# Patient Record
Sex: Female | Born: 1945 | Race: White | Hispanic: No | Smoking: Former smoker
Health system: Southern US, Community
[De-identification: ages and names within clinical notes are randomized; demographics above are authoritative.]

## PROBLEM LIST (undated history)

## (undated) DIAGNOSIS — I10 Essential (primary) hypertension: Secondary | ICD-10-CM

## (undated) DIAGNOSIS — IMO0002 Reserved for concepts with insufficient information to code with codable children: Secondary | ICD-10-CM

## (undated) DIAGNOSIS — N951 Menopausal and female climacteric states: Secondary | ICD-10-CM

## (undated) DIAGNOSIS — K5900 Constipation, unspecified: Secondary | ICD-10-CM

## (undated) DIAGNOSIS — J40 Bronchitis, not specified as acute or chronic: Secondary | ICD-10-CM

## (undated) DIAGNOSIS — M545 Low back pain, unspecified: Secondary | ICD-10-CM

## (undated) DIAGNOSIS — M1990 Unspecified osteoarthritis, unspecified site: Secondary | ICD-10-CM

## (undated) DIAGNOSIS — M2540 Effusion, unspecified joint: Secondary | ICD-10-CM

## (undated) DIAGNOSIS — K255 Chronic or unspecified gastric ulcer with perforation: Secondary | ICD-10-CM

## (undated) DIAGNOSIS — R87619 Unspecified abnormal cytological findings in specimens from cervix uteri: Secondary | ICD-10-CM

## (undated) DIAGNOSIS — G4700 Insomnia, unspecified: Secondary | ICD-10-CM

## (undated) DIAGNOSIS — Z9889 Other specified postprocedural states: Secondary | ICD-10-CM

## (undated) DIAGNOSIS — M2550 Pain in unspecified joint: Secondary | ICD-10-CM

## (undated) DIAGNOSIS — Z8719 Personal history of other diseases of the digestive system: Secondary | ICD-10-CM

## (undated) DIAGNOSIS — Z720 Tobacco use: Secondary | ICD-10-CM

## (undated) DIAGNOSIS — E669 Obesity, unspecified: Secondary | ICD-10-CM

## (undated) HISTORY — DX: Essential (primary) hypertension: I10

## (undated) HISTORY — DX: Menopausal and female climacteric states: N95.1

## (undated) HISTORY — PX: DILATION AND CURETTAGE OF UTERUS: SHX78

## (undated) HISTORY — DX: Reserved for concepts with insufficient information to code with codable children: IMO0002

## (undated) HISTORY — DX: Insomnia, unspecified: G47.00

## (undated) HISTORY — DX: Tobacco use: Z72.0

## (undated) HISTORY — DX: Personal history of other diseases of the digestive system: Z87.19

## (undated) HISTORY — DX: Other specified postprocedural states: Z98.890

## (undated) HISTORY — DX: Obesity, unspecified: E66.9

## (undated) HISTORY — DX: Low back pain: M54.5

## (undated) HISTORY — DX: Unspecified abnormal cytological findings in specimens from cervix uteri: R87.619

## (undated) HISTORY — DX: Low back pain, unspecified: M54.50

## (undated) HISTORY — PX: TONSILLECTOMY: SUR1361

## (undated) HISTORY — DX: Bronchitis, not specified as acute or chronic: J40

## (undated) HISTORY — PX: CATARACT EXTRACTION: SUR2

---

## 1997-10-06 ENCOUNTER — Other Ambulatory Visit: Admission: RE | Admit: 1997-10-06 | Discharge: 1997-10-06 | Payer: Self-pay | Admitting: Obstetrics and Gynecology

## 1998-11-08 ENCOUNTER — Encounter (INDEPENDENT_AMBULATORY_CARE_PROVIDER_SITE_OTHER): Payer: Self-pay | Admitting: *Deleted

## 1998-11-08 ENCOUNTER — Other Ambulatory Visit: Admission: RE | Admit: 1998-11-08 | Discharge: 1998-11-08 | Payer: Self-pay | Admitting: Obstetrics and Gynecology

## 2004-05-17 ENCOUNTER — Ambulatory Visit: Payer: Self-pay | Admitting: Internal Medicine

## 2004-11-15 ENCOUNTER — Ambulatory Visit: Payer: Self-pay | Admitting: Internal Medicine

## 2004-11-22 ENCOUNTER — Encounter (INDEPENDENT_AMBULATORY_CARE_PROVIDER_SITE_OTHER): Payer: Self-pay | Admitting: Specialist

## 2004-11-22 ENCOUNTER — Other Ambulatory Visit: Admission: RE | Admit: 2004-11-22 | Discharge: 2004-11-22 | Payer: Self-pay | Admitting: Internal Medicine

## 2004-11-22 ENCOUNTER — Ambulatory Visit: Payer: Self-pay | Admitting: Internal Medicine

## 2005-03-26 ENCOUNTER — Encounter (INDEPENDENT_AMBULATORY_CARE_PROVIDER_SITE_OTHER): Payer: Self-pay | Admitting: Specialist

## 2005-03-26 ENCOUNTER — Ambulatory Visit: Payer: Self-pay | Admitting: Internal Medicine

## 2005-03-26 ENCOUNTER — Other Ambulatory Visit: Admission: RE | Admit: 2005-03-26 | Discharge: 2005-03-26 | Payer: Self-pay | Admitting: Internal Medicine

## 2005-07-02 ENCOUNTER — Ambulatory Visit: Payer: Self-pay | Admitting: Internal Medicine

## 2005-09-27 ENCOUNTER — Ambulatory Visit: Payer: Self-pay | Admitting: Internal Medicine

## 2005-09-27 ENCOUNTER — Other Ambulatory Visit: Admission: RE | Admit: 2005-09-27 | Discharge: 2005-09-27 | Payer: Self-pay | Admitting: Addiction Medicine

## 2006-03-28 ENCOUNTER — Ambulatory Visit: Payer: Self-pay | Admitting: Internal Medicine

## 2006-09-05 ENCOUNTER — Encounter: Payer: Self-pay | Admitting: Internal Medicine

## 2006-09-05 DIAGNOSIS — I10 Essential (primary) hypertension: Secondary | ICD-10-CM | POA: Insufficient documentation

## 2006-10-08 ENCOUNTER — Ambulatory Visit: Payer: Self-pay | Admitting: Internal Medicine

## 2006-10-08 DIAGNOSIS — M549 Dorsalgia, unspecified: Secondary | ICD-10-CM

## 2006-10-08 DIAGNOSIS — G8929 Other chronic pain: Secondary | ICD-10-CM

## 2006-10-09 ENCOUNTER — Telehealth: Payer: Self-pay | Admitting: Internal Medicine

## 2006-10-14 ENCOUNTER — Other Ambulatory Visit: Admission: RE | Admit: 2006-10-14 | Discharge: 2006-10-14 | Payer: Self-pay | Admitting: Obstetrics and Gynecology

## 2007-02-04 ENCOUNTER — Ambulatory Visit: Payer: Self-pay | Admitting: Internal Medicine

## 2007-02-04 LAB — CONVERTED CEMR LAB
ALT: 21 units/L (ref 0–35)
Basophils Relative: 0.8 % (ref 0.0–1.0)
Bilirubin, Direct: 0.2 mg/dL (ref 0.0–0.3)
CO2: 29 meq/L (ref 19–32)
Eosinophils Relative: 4.2 % (ref 0.0–5.0)
GFR calc Af Amer: 94 mL/min
Glucose, Bld: 107 mg/dL — ABNORMAL HIGH (ref 70–99)
Hemoglobin: 14.3 g/dL (ref 12.0–15.0)
Ketones, urine, test strip: NEGATIVE
Lymphocytes Relative: 26 % (ref 12.0–46.0)
Monocytes Absolute: 0.6 10*3/uL (ref 0.2–0.7)
Neutro Abs: 5.6 10*3/uL (ref 1.4–7.7)
Nitrite: NEGATIVE
Potassium: 4.9 meq/L (ref 3.5–5.1)
Total Protein: 7.3 g/dL (ref 6.0–8.3)
VLDL: 25 mg/dL (ref 0–40)
WBC: 9.1 10*3/uL (ref 4.5–10.5)
pH: 5.5

## 2007-02-10 ENCOUNTER — Ambulatory Visit: Payer: Self-pay | Admitting: Internal Medicine

## 2007-04-13 ENCOUNTER — Telehealth (INDEPENDENT_AMBULATORY_CARE_PROVIDER_SITE_OTHER): Payer: Self-pay | Admitting: *Deleted

## 2007-07-24 ENCOUNTER — Ambulatory Visit: Payer: Self-pay | Admitting: Internal Medicine

## 2007-09-02 ENCOUNTER — Telehealth: Payer: Self-pay | Admitting: Internal Medicine

## 2008-01-15 ENCOUNTER — Ambulatory Visit: Payer: Self-pay | Admitting: Internal Medicine

## 2008-07-08 ENCOUNTER — Ambulatory Visit: Payer: Self-pay | Admitting: Internal Medicine

## 2008-07-08 LAB — CONVERTED CEMR LAB
AST: 23 units/L (ref 0–37)
Alkaline Phosphatase: 67 units/L (ref 39–117)
BUN: 21 mg/dL (ref 6–23)
Basophils Absolute: 0.1 10*3/uL (ref 0.0–0.1)
Bilirubin Urine: NEGATIVE
Bilirubin, Direct: 0 mg/dL (ref 0.0–0.3)
Calcium: 9.4 mg/dL (ref 8.4–10.5)
Creatinine, Ser: 0.9 mg/dL (ref 0.4–1.2)
GFR calc non Af Amer: 67.17 mL/min (ref >60)
Glucose, Bld: 102 mg/dL — ABNORMAL HIGH (ref 70–99)
Hemoglobin: 13.8 g/dL (ref 12.0–15.0)
Lymphocytes Relative: 25.9 % (ref 12.0–46.0)
Monocytes Relative: 5.6 % (ref 3.0–12.0)
Neutrophils Relative %: 64.4 % (ref 43.0–77.0)
Platelets: 236 10*3/uL (ref 150.0–400.0)
RDW: 12.5 % (ref 11.5–14.6)
Sodium: 140 meq/L (ref 135–145)
TSH: 0.73 microintl units/mL (ref 0.35–5.50)
Total Bilirubin: 0.8 mg/dL (ref 0.3–1.2)
Urobilinogen, UA: 0.2
WBC Urine, dipstick: NEGATIVE

## 2008-07-14 ENCOUNTER — Encounter: Payer: Self-pay | Admitting: Internal Medicine

## 2008-07-15 ENCOUNTER — Encounter: Payer: Self-pay | Admitting: Internal Medicine

## 2008-07-15 ENCOUNTER — Other Ambulatory Visit: Admission: RE | Admit: 2008-07-15 | Discharge: 2008-07-15 | Payer: Self-pay | Admitting: Internal Medicine

## 2008-07-15 ENCOUNTER — Ambulatory Visit: Payer: Self-pay | Admitting: Internal Medicine

## 2008-07-15 DIAGNOSIS — F17200 Nicotine dependence, unspecified, uncomplicated: Secondary | ICD-10-CM

## 2009-01-17 ENCOUNTER — Ambulatory Visit: Payer: Self-pay | Admitting: Internal Medicine

## 2009-02-06 ENCOUNTER — Telehealth: Payer: Self-pay | Admitting: Internal Medicine

## 2009-03-24 ENCOUNTER — Telehealth: Payer: Self-pay | Admitting: Family Medicine

## 2009-04-03 ENCOUNTER — Telehealth: Payer: Self-pay | Admitting: Internal Medicine

## 2009-05-19 ENCOUNTER — Encounter: Payer: Self-pay | Admitting: Internal Medicine

## 2009-06-12 ENCOUNTER — Telehealth: Payer: Self-pay | Admitting: Internal Medicine

## 2009-07-04 ENCOUNTER — Telehealth: Payer: Self-pay | Admitting: Internal Medicine

## 2009-07-19 ENCOUNTER — Ambulatory Visit: Payer: Self-pay | Admitting: Internal Medicine

## 2009-09-26 ENCOUNTER — Telehealth: Payer: Self-pay | Admitting: Internal Medicine

## 2009-12-18 ENCOUNTER — Telehealth: Payer: Self-pay | Admitting: Internal Medicine

## 2009-12-25 ENCOUNTER — Telehealth: Payer: Self-pay | Admitting: Internal Medicine

## 2010-02-27 NOTE — Progress Notes (Signed)
Summary: Pt req refill of Hydrocodone 5-500mg   Phone Note Call from Patient Call back at [facility] Phone [phone]   Caller: Patient Summary of Call: Pt called and is req refill of Hydrocone 5-500mg . [facility] [phone].       Initial call taken by: Lucy Antigua,  February 06, 2009 8:40 AM  Follow-up for Phone Call        Rx Called In Follow-up by: Raechel Ache, RN,  February 06, 2009 12:49 PM    Prescriptions: VICODIN 5-500 MG  TABS (HYDROCODONE-ACETAMINOPHEN) one by mouth q 4-6 hrs as needed pain  #90 x 1   Entered by:   Raechel Ache, RN   Authorized by:   Gordy Savers  MD   Signed by:   Raechel Ache, RN on 02/06/2009   Method used:   Historical   RxID:   [id number]  # 90  rf 1

## 2010-02-27 NOTE — Progress Notes (Signed)
Summary: refill hydrocodone apap  Phone Note Refill Request Message from:  Fax from Pharmacy on December 25, 2009 9:35 AM  Refills Requested: Medication #1:  VICODIN 5-500 MG  TABS one by mouth q 4-6 hrs as needed pain   Last Refilled: 11/23/[facility] [address] [facility] [place]   Method Requested: Fax to Local Pharmacy Initial call taken by: Duard Brady LPN,  December 25, 2009 9:36 AM    Prescriptions: VICODIN 5-500 MG  TABS (HYDROCODONE-ACETAMINOPHEN) one by mouth q 4-6 hrs as needed pain  #90 x 2   Entered by:   Duard Brady LPN   Authorized by:   Gordy Savers  MD   Signed by:   Duard Brady LPN on 95/28/4132   Method used:   Historical   RxID:   [id number]  #9  RF 2

## 2010-02-27 NOTE — Progress Notes (Signed)
Summary: refill ambiem  Phone Note Refill Request Message from:  Fax from Pharmacy on December 18, 2009 12:05 PM  Refills Requested: Medication #1:  AMBIEN 10 MG TABS Take 1 tablet by mouth at bedtime   [facility]: [facility]   Method Requested: Fax to Local Pharmacy Initial call taken by: Duard Brady LPN,  December 18, 2009 12:05 PM    Prescriptions: AMBIEN 10 MG TABS (ZOLPIDEM TARTRATE) Take 1 tablet by mouth at bedtime  #30 x 4   Entered by:   Duard Brady LPN   Authorized by:   Gordy Savers  MD   Signed by:   Duard Brady LPN on 16/10/9602   Method used:   Historical   RxID:   [id number]  [facility]    [facility]

## 2010-02-27 NOTE — Progress Notes (Signed)
Summary: med refill  Phone Note Call from Patient   Caller: Patient Call For: Sally Savers  MD Summary of Call: [facility] [place] [phone] Pt. needs refill on Vicodin 5/500 before Dr. Kirtland Bouchard returns Monday. Initial call taken by: Lynann Beaver CMA,  March 24, 2009 9:49 AM  Follow-up for Phone Call        May refill # 30 tablets to hold pt until Dr Kirtland Bouchard returns. Follow-up by: Evelena Peat MD,  March 24, 2009 10:31 AM    Prescriptions: VICODIN 5-500 MG  TABS (HYDROCODONE-ACETAMINOPHEN) one by mouth q 4-6 hrs as needed pain  #30 x 0   Entered by:   Lynann Beaver CMA   Authorized by:   Stacie Glaze MD   Signed by:   Lynann Beaver CMA on 03/24/2009   Method used:   [facility] ...       [facility]  [id number]       [address]       [place], [place]  [address]       Ph: [phone]       Fax: [phone]   RxID:   [id number]

## 2010-02-27 NOTE — Progress Notes (Signed)
Summary: refill vicodan  Phone Note Refill Request Message from:  Fax from Pharmacy on September 26, 2009 11:02 AM  Refills Requested: Medication #1:  VICODIN 5-500 MG  TABS one by mouth q 4-6 hrs as needed pain   Last Refilled: 08/30/[facility] [address]  [phone]   Method Requested: Telephone to Pharmacy Initial call taken by: Duard Brady LPN,  September 26, 2009 11:02 AM  Follow-up for Phone Call        number 90, refill x 2 Follow-up by: Gordy Savers  MD,  September 26, 2009 12:48 PM    Prescriptions: VICODIN 5-500 MG  TABS (HYDROCODONE-ACETAMINOPHEN) one by mouth q 4-6 hrs as needed pain  #90 x 2   Entered by:   Duard Brady LPN   Authorized by:   Gordy Savers  MD   Signed by:   Duard Brady LPN on 11/91/4782   Method used:   Historical   RxID:   [id number]  [facility]

## 2010-02-27 NOTE — Progress Notes (Signed)
Summary: refill vicodin  Phone Note Refill Request Message from:  Fax from Pharmacy on July 04, 2009 4:58 PM  Refills Requested: Medication #1:  VICODIN 5-500 MG  TABS one by mouth q 4-6 hrs as needed pain   [facility]: [facility]    [phone]   Method Requested: Telephone to Pharmacy Initial call taken by: Duard Brady LPN,  July 04, 1189 4:59 PM  Follow-up for Phone Call        #90  RF 2 Follow-up by: Gordy Savers  MD,  July 04, 2009 5:13 PM  Additional Follow-up for Phone Call Additional follow up Details #1::        [facility]  [facility] Additional Follow-up by: Duard Brady LPN,  July 04, 4780 5:21 PM    Prescriptions: VICODIN 5-500 MG  TABS (HYDROCODONE-ACETAMINOPHEN) one by mouth q 4-6 hrs as needed pain  #90 x 2   Entered by:   Duard Brady LPN   Authorized by:   Gordy Savers  MD   Signed by:   Duard Brady LPN on 95/62/1308   Method used:   Historical   RxID:   [id number]

## 2010-02-27 NOTE — Medication Information (Signed)
Summary: Coverage Approval for Zolpidem Tartrate  Coverage Approval for Zolpidem Tartrate   Imported By: Maryln Gottron 05/23/2009 12:35:05  _____________________________________________________________________  External Attachment:    Type:   Image     Comment:   External Document

## 2010-02-27 NOTE — Progress Notes (Signed)
Summary: refill vicodin  Phone Note Refill Request Message from:  Fax from Pharmacy on Jun 12, 2009 11:10 AM  Refills Requested: Medication #1:  VICODIN 5-500 MG  TABS one by mouth q 4-6 hrs as needed pain   Last Refilled: 05/22/[facility] [address]   [phone]   Method Requested: Telephone to Pharmacy Initial call taken by: Duard Brady LPN,  Jun 12, 2009 11:11 AM  Follow-up for Phone Call        NO RF until 07-04-2009 Follow-up by: Gordy Savers  MD,  Jun 12, 2009 12:49 PM  Additional Follow-up for Phone Call Additional follow up Details #1::        called pharmacist - denied - no refills until 07/04/2009 Additional Follow-up by: Duard Brady LPN,  Jun 12, 2009 12:52 PM

## 2010-02-27 NOTE — Progress Notes (Signed)
Summary: Vicodan refill  Phone Note Call from Patient   Caller: Patient Call For: Sally Savers  MD Reason for Call: Refill Medication Summary of Call: rec'd refill #30 on 2/28 for Vicodan when dr. Amador Cunas was out  - states she only has a couple left - wants regular #90 with refills please.   Initial call taken by: Duard Brady LPN,  April 03, 2009 3:19 PM    Prescriptions: VICODIN 5-500 MG  TABS (HYDROCODONE-ACETAMINOPHEN) one by mouth q 4-6 hrs as needed pain  #90 x 2   Entered by:   Duard Brady LPN   Authorized by:   Sally Savers  MD   Signed by:   Duard Brady LPN on 16/10/9602   Method used:   Historical   RxID:   [id number]  #90 RF 2  called pt [facility]

## 2010-02-27 NOTE — Assessment & Plan Note (Signed)
Summary: 6 month rov/njr/pt rsc/cjr   Vital Signs:  Patient profile:   65 year old female Weight:      183 pounds Temp:     98.0 degrees F oral BP sitting:   120 / 80  (left arm) Cuff size:   regular  Vitals Entered By: Duard Brady LPN (July 19, 2009 9:58 AM) CC: 6 mos rov - doing well, Hypertension Management Is Patient Diabetic? No   CC:  6 mos rov - doing well and Hypertension Management.  History of Present Illness: [age] -year-old patient who is seen today for follow-up of her hypertension.  She has chronic back pain and ongoing tobacco use, and a history of exogenous obesity.  Hypertension History:      Positive major cardiovascular risk factors include female age 11 years old or older, hypertension, and current tobacco user.     Preventive Screening-Counseling & Management  Alcohol-Tobacco     Smoking Status: current     Smoking Cessation Counseling: yes  Allergies (verified): No Known Drug Allergies  Past History:  Past Medical History: Reviewed history from 02/10/2007 and no changes required. Hypertension Insomnia Obesity Menopausal syndrome tobacco use low back pain history bronchitis history of impaired glucose tolerance history of abnormal Pap  Review of Systems  The patient denies anorexia, fever, weight loss, weight gain, vision loss, decreased hearing, hoarseness, chest pain, syncope, dyspnea on exertion, peripheral edema, prolonged cough, headaches, hemoptysis, abdominal pain, melena, hematochezia, severe indigestion/heartburn, hematuria, incontinence, genital sores, muscle weakness, suspicious skin lesions, transient blindness, difficulty walking, depression, unusual weight change, abnormal bleeding, enlarged lymph nodes, angioedema, and breast masses.    Physical Exam  General:  overweight-appearing.  140/80 Head:  Normocephalic and atraumatic without obvious abnormalities. No apparent alopecia or balding. Eyes:  No corneal or  conjunctival inflammation noted. EOMI. Perrla. Funduscopic exam benign, without hemorrhages, exudates or papilledema. Vision grossly normal. Mouth:  Oral mucosa and oropharynx without lesions or exudates.  Teeth in good repair. Neck:  No deformities, masses, or tenderness noted. Lungs:  Normal respiratory effort, chest expands symmetrically. Lungs are clear to auscultation, no crackles or wheezes. Heart:  Normal rate and regular rhythm. S1 and S2 normal without gallop, murmur, click, rub or other extra sounds. Abdomen:  Bowel sounds positive,abdomen soft and non-tender without masses, organomegaly or hernias noted. Msk:  No deformity or scoliosis noted of thoracic or lumbar spine.   Pulses:  R and L carotid,radial,femoral,dorsalis pedis and posterior tibial pulses are full and equal bilaterally   Impression & Recommendations:  Problem # 1:  TOBACCO ABUSE (ICD-305.1)  The following medications were removed from the medication list:    Nicotine 21 Mg/24hr Pt24 (Nicotine) ..... Use daily as directed    Nicotine 7 Mg/24hr Pt24 (Nicotine) ..... Use daily as directed    Nicotine 14 Mg/24hr Pt24 (Nicotine) ..... Use daily as directed  Problem # 2:  BACK PAIN, CHRONIC (ICD-724.5)  Her updated medication list for this problem includes:    Meloxicam 15 Mg Tabs (Meloxicam) .[name]... 1 once daily    Vicodin 5-500 Mg Tabs (Hydrocodone-acetaminophen) ..... One by mouth q 4-6 hrs as needed pain    Aspir-low 81 Mg Tbec (Aspirin) ..... One daily  Problem # 3:  OBESITY (ICD-278.00)  Problem # 4:  HYPERTENSION (ICD-401.9)  Her updated medication list for this problem includes:    Hydrochlorothiazide 25 Mg Tabs (Hydrochlorothiazide) .[name]... 1 once daily    Diltiazem Hcl Er Beads 360 Mg Cp24 (Diltiazem hcl er beads) ..... One  by mouth daily  Complete Medication List: 1)  Ambien 10 Mg Tabs (Zolpidem tartrate) .... Take 1 tablet by mouth at bedtime 2)  Hydrochlorothiazide 25 Mg Tabs (Hydrochlorothiazide) .[name]..  1 once daily 3)  Meloxicam 15 Mg Tabs (Meloxicam) .[name].. 1 once daily 4)  Diltiazem Hcl Er Beads 360 Mg Cp24 (Diltiazem hcl er beads) .... One by mouth daily 5)  Vicodin 5-500 Mg Tabs (Hydrocodone-acetaminophen) .... One by mouth q 4-6 hrs as needed pain 6)  Aspir-low 81 Mg Tbec (Aspirin) .... One daily  Hypertension Assessment/Plan:      The patient's hypertensive risk group is category B: At least one risk factor (excluding diabetes) with no target organ damage.  Her calculated 10 year risk of coronary heart disease is 13 %.  Today's blood pressure is 120/80.    Patient Instructions: 1)  return at age 65 for your annual physical 2)  Limit your Sodium (Salt) to less than 2 grams a day(slightly less than 1/2 a teaspoon) to prevent fluid retention, swelling, or worsening of symptoms. 3)  Tobacco is very bad for your health and your loved ones! You Should stop smoking!. 4)  It is important that you exercise regularly at least 20 minutes 5 times a week. If you develop chest pain, have severe difficulty breathing, or feel very tired , stop exercising immediately and seek medical attention. 5)  You need to lose weight. Consider a lower calorie diet and regular exercise.  6)  Check your Blood Pressure regularly. If it is above: 150/90 you should make an appointment. Prescriptions: DILTIAZEM HCL ER BEADS 360 MG  CP24 (DILTIAZEM HCL ER BEADS) one by mouth daily  #90 x 6   Entered and Authorized by:   Gordy Savers  MD   Signed by:   Gordy Savers  MD on 07/19/2009   Method used:   Print then Give to Patient   RxID:   [id number] MELOXICAM 15 MG  TABS (MELOXICAM) 1 once daily  #90 x 6   Entered and Authorized by:   Gordy Savers  MD   Signed by:   Gordy Savers  MD on 07/19/2009   Method used:   Print then Give to Patient   RxID:   [id number] HYDROCHLOROTHIAZIDE 25 MG TABS (HYDROCHLOROTHIAZIDE) 1 once daily  #90 x 6   Entered and Authorized by:   Gordy Savers  MD   Signed by:   Gordy Savers  MD on 07/19/2009   Method used:   Print then Give to Patient   RxID:   [id number] AMBIEN 10 MG TABS (ZOLPIDEM TARTRATE) Take 1 tablet by mouth at bedtime  #30 x 4   Entered and Authorized by:   Gordy Savers  MD   Signed by:   Gordy Savers  MD on 07/19/2009   Method used:   Print then Give to Patient   RxID:   [id number]

## 2010-03-09 ENCOUNTER — Other Ambulatory Visit: Payer: Self-pay

## 2010-03-09 DIAGNOSIS — R52 Pain, unspecified: Secondary | ICD-10-CM

## 2010-03-09 MED ORDER — HYDROCODONE-ACETAMINOPHEN 5-500 MG PO TABS: 1.0000 | ORAL_TABLET | ORAL | Status: DC | PRN

## 2010-03-09 NOTE — Telephone Encounter (Signed)
[facility] 

## 2010-04-10 ENCOUNTER — Other Ambulatory Visit: Payer: Self-pay

## 2010-04-10 DIAGNOSIS — R52 Pain, unspecified: Secondary | ICD-10-CM

## 2010-04-10 NOTE — Telephone Encounter (Signed)
recv'd request from pt for refill on vicodin - last filled 03/09/2010 - has cpx in April. Please advise

## 2010-04-11 MED ORDER — HYDROCODONE-ACETAMINOPHEN 5-500 MG PO TABS: 1.0000 | ORAL_TABLET | ORAL | Status: DC | PRN

## 2010-04-11 NOTE — Telephone Encounter (Signed)
[facility] 

## 2010-04-11 NOTE — Telephone Encounter (Signed)
50

## 2010-04-30 ENCOUNTER — Other Ambulatory Visit: Payer: Self-pay

## 2010-04-30 NOTE — Telephone Encounter (Signed)
Too early

## 2010-04-30 NOTE — Telephone Encounter (Signed)
Faxed denial back to [facility]

## 2010-04-30 NOTE — Telephone Encounter (Signed)
Refill request from [facility] [address] for vicodin 5-500 Last seen 06/2009 Last fill rx #50 0RF 04/11/2010  Please advise

## 2010-05-10 ENCOUNTER — Encounter: Payer: Self-pay | Admitting: Internal Medicine

## 2010-05-11 ENCOUNTER — Encounter: Payer: Self-pay | Admitting: Internal Medicine

## 2010-05-11 ENCOUNTER — Ambulatory Visit (INDEPENDENT_AMBULATORY_CARE_PROVIDER_SITE_OTHER): Payer: Medicare Other | Admitting: Internal Medicine

## 2010-05-11 VITALS — BP 126/80 | HR 82 | Temp 98.0°F | Resp 18 | Ht <= 58 in | Wt 182.0 lb

## 2010-05-11 DIAGNOSIS — R52 Pain, unspecified: Secondary | ICD-10-CM

## 2010-05-11 DIAGNOSIS — I10 Essential (primary) hypertension: Secondary | ICD-10-CM

## 2010-05-11 DIAGNOSIS — F17200 Nicotine dependence, unspecified, uncomplicated: Secondary | ICD-10-CM

## 2010-05-11 DIAGNOSIS — M549 Dorsalgia, unspecified: Secondary | ICD-10-CM

## 2010-05-11 DIAGNOSIS — E669 Obesity, unspecified: Secondary | ICD-10-CM

## 2010-05-11 DIAGNOSIS — Z0000 Encounter for general adult medical examination without abnormal findings: Secondary | ICD-10-CM

## 2010-05-11 LAB — CBC WITH DIFFERENTIAL/PLATELET
Basophils Absolute: 0.1 10*3/uL (ref 0.0–0.1)
Basophils Relative: 0.7 % (ref 0.0–3.0)
Eosinophils Absolute: 0.3 10*3/uL (ref 0.0–0.7)
Hemoglobin: 14.4 g/dL (ref 12.0–15.0)
Lymphocytes Relative: 21.1 % (ref 12.0–46.0)
MCHC: 34.3 g/dL (ref 30.0–36.0)
Monocytes Relative: 4.7 % (ref 3.0–12.0)
Neutro Abs: 7.8 10*3/uL — ABNORMAL HIGH (ref 1.4–7.7)
Platelets: 259 10*3/uL (ref 150.0–400.0)
RBC: 4.56 Mil/uL (ref 3.87–5.11)
RDW: 14.2 % (ref 11.5–14.6)

## 2010-05-11 LAB — BASIC METABOLIC PANEL
BUN: 15 mg/dL (ref 6–23)
CO2: 29 mEq/L (ref 19–32)
Chloride: 103 mEq/L (ref 96–112)
Creatinine, Ser: 0.9 mg/dL (ref 0.4–1.2)
Glucose, Bld: 105 mg/dL — ABNORMAL HIGH (ref 70–99)
Potassium: 4.7 mEq/L (ref 3.5–5.1)

## 2010-05-11 LAB — HEPATIC FUNCTION PANEL
ALT: 21 U/L (ref 0–35)
AST: 19 U/L (ref 0–37)
Alkaline Phosphatase: 76 U/L (ref 39–117)
Total Bilirubin: 0.8 mg/dL (ref 0.3–1.2)

## 2010-05-11 LAB — LIPID PANEL
Cholesterol: 178 mg/dL (ref 0–200)
HDL: 56.5 mg/dL (ref >39.00)
VLDL: 28.4 mg/dL (ref 0.0–40.0)

## 2010-05-11 LAB — TSH: TSH: 0.52 u[IU]/mL (ref 0.35–5.50)

## 2010-05-11 MED ORDER — MELOXICAM 15 MG PO TABS: 15.0000 mg | ORAL_TABLET | Freq: Every day | ORAL | Status: DC

## 2010-05-11 MED ORDER — DILTIAZEM HCL ER BEADS 360 MG PO CP24: 360.0000 mg | ORAL_CAPSULE | Freq: Every day | ORAL | Status: DC

## 2010-05-11 MED ORDER — HYDROCODONE-ACETAMINOPHEN 5-500 MG PO TABS: 1.0000 | ORAL_TABLET | ORAL | Status: DC | PRN

## 2010-05-11 MED ORDER — ZOLPIDEM TARTRATE 10 MG PO TABS: 10.0000 mg | ORAL_TABLET | Freq: Every evening | ORAL | Status: DC | PRN

## 2010-05-11 MED ORDER — HYDROCHLOROTHIAZIDE 25 MG PO TABS: 25.0000 mg | ORAL_TABLET | Freq: Every day | ORAL | Status: DC

## 2010-05-11 NOTE — Patient Instructions (Signed)
Limit your sodium (Salt) intake  You need to lose weight.  Consider a lower calorie diet and regular exercise.  Smoking tobacco is very bad for your health. You should stop smoking immediately.    It is important that you exercise regularly, at least 20 minutes 3 to 4 times per week.  If you develop chest pain or shortness of breath seek  medical attention.  Return in 6 months for follow-up

## 2010-05-11 NOTE — Progress Notes (Signed)
Subjective:    Patient ID: Sally Douglas, female    DOB: 1946/01/02, 65 y.o.   MRN: [id number]  HPI  65 year old patient who is seen today for a health maintenance examination. Medical problems include hypertension and chronic low back pain she has been out of her analgesics and has not done well. She has been compliant with her blood pressure medications. She has declined screening mammograms and colonoscopies. This again was addressed and she again declines.    1. Risk factors, based on past  M,S,F history- cardiovascular risk factors include hypertension and ongoing tobacco use   2.  Physical activities:Is limited somewhat due to chronic low back pain that is fairly active with yard work and gardening  3.  Depression/mood:No history of depression or mood disorder  4.  Hearing:No deficits  5.  ADL's:Independent in all aspects of daily living  6.  Fall risk:Low  7.  Home safety:No problems identified  8.  Height weight, and visual acuity;Height and weight stable no change in visual acuity  9.  Counseling:Heart healthy diet smoking cessation encouraged modest weight loss also encouraged  10. Lab orders based on risk factors:Laboratory profile including TSH and lipid profile will be revi  11. Referral : Declines a screening colonoscopy and mammogram  12. Care plan:Heart healthy diet regular exercise modest weight loss recommended  13. Cognitive assessment: Alert and oriented with normal affect no cognitive dysfunction  Past Medical History  Diagnosis Date  . Hypertension   . Insomnia   . Obesity   . Menopausal syndrome   . Tobacco abuse   . Low back pain   . Bronchitis   . Impaired glucose tolerance   . Abnormal finding on Pap smear     hx abnl pap   Past Surgical History  Procedure Date  . Gyn surgery   . Tonsillectomy   . Dilation and curettage of uterus     reports that she has been smoking Cigarettes.  She has been smoking about .5 packs per day. She has never used  smokeless tobacco. She reports that she does not drink alcohol or use illicit drugs. family history includes Breast cancer in an unspecified family member; Cancer in her brother; Diabetes in her brother; and Kidney disease in an unspecified family member. No Known Allergies     Patient profile: 65 year old female  Weight: 183 pounds  BMI: 38.39  BP sitting: 142 / 84 (right arm)  Cuff size: regular  Vitals Entered By: Raechel Ache, RN (July 15, 2008 9:32 AM)  CC: CPX and labs done.[name]  History of Present Illness:  65 year old patient is seen today for an annual examination. Medical problems include hypertension, ongoing tobacco use. She has a history of exogenous obesity and chronic low back pain. No new concerns or complaints. No prior colonoscopy, which she again adamantly declines today  Preventive Screening-Counseling & Management  Caffeine-Diet-Exercise  Does Patient Exercise: no  Problems Prior to Update:  1) Family History Breast Cancer 1st Degree Relative <50 (ICD-V16.3)  2) Physical Examination (ICD-V70.0)  3) Back Pain, Chronic (ICD-724.5)  4) Obesity (ICD-278.00)  5) Hypertension (ICD-401.9)  Medications Prior to Update:  1) Ambien 10 Mg Tabs (Zolpidem Tartrate) .... Take 1 Tablet By Mouth At Bedtime  2) Hydrochlorothiazide 25 Mg Tabs (Hydrochlorothiazide) .[name].. 1 Once Daily  3) Meloxicam 15 Mg Tabs (Meloxicam) .[name].. 1 Once Daily  4) Skelaxin 800 Mg Tabs (Metaxalone) .... One Twice Daily As Needed  5) Diltiazem Hcl Er Beads 360  Mg Cp24 (Diltiazem Hcl Er Beads) .... One By Mouth Daily  6) Vicodin 5-500 Mg Tabs (Hydrocodone-Acetaminophen) .... One By Mouth Q 4-6 Hrs As Needed Pain  Allergies:  No Known Drug Allergies  Past History:  Past Medical History:  Reviewed history from 02/10/2007 and no changes required.  Hypertension  Insomnia  Obesity  Menopausal syndrome  tobacco use  low back pain  history bronchitis  history of impaired glucose tolerance  history of  abnormal Pap  Past Surgical History:  GYN surgery  Tonsillectomy age 28  status post D&C  gravida one, para one, aborta zero  colonoscopy-declines  Family History:  Reviewed history from 02/10/2007 and no changes required.  father died age 44, cerebral, hemorrhage;  mother died age 23 History, diabetes, breast cancer.  One brother two sisters positive for renal cancer, diabetes, and breast cancer  Family History Breast cancer 1st degree relative <50  Family History Kidney disease  Social History:  Reviewed history from 02/10/2007 and no changes required.  Married  Current Smoker-  Regular exercise-no  Does Patient Exercise: no     Review of Systems  Constitutional: Negative for fever, appetite change, fatigue and unexpected weight change.  HENT: Negative for hearing loss, ear pain, nosebleeds, congestion, sore throat, mouth sores, trouble swallowing, neck stiffness, dental problem, voice change, sinus pressure and tinnitus.   Eyes: Negative for photophobia, pain, redness and visual disturbance.  Respiratory: Negative for cough, chest tightness and shortness of breath.   Cardiovascular: Negative for chest pain, palpitations and leg swelling.  Gastrointestinal: Negative for nausea, vomiting, abdominal pain, diarrhea, constipation, blood in stool, abdominal distention and rectal pain.  Genitourinary: Negative for dysuria, urgency, frequency, hematuria, flank pain, vaginal bleeding, vaginal discharge, difficulty urinating, genital sores, vaginal pain, menstrual problem and pelvic pain.  Musculoskeletal: Positive for back pain. Negative for arthralgias.       Also complaining of chronic right shoulder pain with decreased range of motion  Skin: Negative for rash.  Neurological: Negative for dizziness, syncope, speech difficulty, weakness, light-headedness, numbness and headaches.  Hematological: Negative for adenopathy. Does not bruise/bleed easily.  Psychiatric/Behavioral: Negative for  suicidal ideas, behavioral problems, self-injury, dysphoric mood and agitation. The patient is not nervous/anxious.        Objective:   Physical Exam  Constitutional: She is oriented to person, place, and time. She appears well-developed and well-nourished.  HENT:  Head: Normocephalic and atraumatic.  Right Ear: External ear normal.  Left Ear: External ear normal.  Mouth/Throat: Oropharynx is clear and moist.  Eyes: Conjunctivae and EOM are normal.  Neck: Normal range of motion. Neck supple. No JVD present. No thyromegaly present.  Cardiovascular: Normal rate, regular rhythm, normal heart sounds and intact distal pulses.   No murmur heard. Pulmonary/Chest: Effort normal and breath sounds normal. She has no wheezes. She has no rales.  Abdominal: Soft. Bowel sounds are normal. She exhibits no distension and no mass. There is no tenderness. There is no rebound and no guarding.  Genitourinary: Vagina normal. Guaiac negative stool. No vaginal discharge found.  Musculoskeletal: Normal range of motion. She exhibits no edema and no tenderness.  Neurological: She is alert and oriented to person, place, and time. She has normal reflexes. No cranial nerve deficit. She exhibits normal muscle tone. Coordination normal.  Skin: Skin is warm and dry. No rash noted.  Psychiatric: She has a normal mood and affect. Her behavior is normal.          Assessment & Plan:  Annual  health assessment. Ongoing tobacco use Hypertension well controlled Chronic low back pain. Analgesic medications and anti-inflammatories refilled  We'll check lipid profile in screening labs

## 2010-09-27 ENCOUNTER — Other Ambulatory Visit: Payer: Self-pay | Admitting: Internal Medicine

## 2010-09-27 NOTE — Telephone Encounter (Signed)
90

## 2010-09-27 NOTE — Telephone Encounter (Signed)
Pt is requesting a refill on her hydrocodone. Please call in to the [facility] on [address] in [place].

## 2010-09-27 NOTE — Telephone Encounter (Signed)
Last seen 05/11/10   Last written 05/11/10  #90 4RF Please advise

## 2010-09-28 NOTE — Telephone Encounter (Signed)
Called [facility] to [facility] 

## 2010-10-29 ENCOUNTER — Other Ambulatory Visit: Payer: Self-pay | Admitting: Internal Medicine

## 2010-10-29 NOTE — Telephone Encounter (Signed)
ok 

## 2010-10-29 NOTE — Telephone Encounter (Signed)
Last seen 05/11/10   Last written 09/27/10 #90 0RF Please advise

## 2010-11-09 ENCOUNTER — Encounter: Payer: Self-pay | Admitting: Internal Medicine

## 2010-11-09 ENCOUNTER — Ambulatory Visit (INDEPENDENT_AMBULATORY_CARE_PROVIDER_SITE_OTHER): Payer: Medicare Other | Admitting: Internal Medicine

## 2010-11-09 DIAGNOSIS — I10 Essential (primary) hypertension: Secondary | ICD-10-CM

## 2010-11-09 DIAGNOSIS — F17200 Nicotine dependence, unspecified, uncomplicated: Secondary | ICD-10-CM

## 2010-11-09 DIAGNOSIS — K439 Ventral hernia without obstruction or gangrene: Secondary | ICD-10-CM

## 2010-11-09 MED ORDER — MELOXICAM 15 MG PO TABS: 15.0000 mg | ORAL_TABLET | Freq: Every day | ORAL | Status: DC

## 2010-11-09 MED ORDER — HYDROCODONE-ACETAMINOPHEN 5-500 MG PO TABS: 1.0000 | ORAL_TABLET | Freq: Three times a day (TID) | ORAL | Status: DC | PRN

## 2010-11-09 MED ORDER — HYDROCHLOROTHIAZIDE 25 MG PO TABS: 25.0000 mg | ORAL_TABLET | Freq: Every day | ORAL | Status: DC

## 2010-11-09 MED ORDER — DILTIAZEM HCL ER BEADS 360 MG PO CP24: 360.0000 mg | ORAL_CAPSULE | Freq: Every day | ORAL | Status: DC

## 2010-11-09 MED ORDER — ZOLPIDEM TARTRATE 10 MG PO TABS: 10.0000 mg | ORAL_TABLET | Freq: Every evening | ORAL | Status: DC | PRN

## 2010-11-09 NOTE — Progress Notes (Signed)
  Subjective:    Patient ID: Sally Douglas, female    DOB: [birth year], 65 y.o.   MRN: [id number]  HPI  65 year old patient who is seen today for followup she has a history of treated hypertension chronic low back pain and ongoing tobacco use. She describes a long history of a lower mid abdominal mass. She feels this has enlarged slightly over the last 6 months. Does not cause any pain or discomfort denies any change in her bowel habits. No nausea or vomiting  She continues to smoke    Review of Systems  Constitutional: Negative.   HENT: Negative for hearing loss, congestion, sore throat, rhinorrhea, dental problem, sinus pressure and tinnitus.   Eyes: Negative for pain, discharge and visual disturbance.  Respiratory: Negative for cough and shortness of breath.   Cardiovascular: Negative for chest pain, palpitations and leg swelling.  Gastrointestinal: Positive for abdominal distention. Negative for nausea, vomiting, abdominal pain, diarrhea, constipation and blood in stool.  Genitourinary: Negative for dysuria, urgency, frequency, hematuria, flank pain, vaginal bleeding, vaginal discharge, difficulty urinating, vaginal pain and pelvic pain.  Musculoskeletal: Negative for joint swelling, arthralgias and gait problem.  Skin: Negative for rash.  Neurological: Negative for dizziness, syncope, speech difficulty, weakness, numbness and headaches.  Hematological: Negative for adenopathy.  Psychiatric/Behavioral: Negative for behavioral problems, dysphoric mood and agitation. The patient is not nervous/anxious.        Objective:   Physical Exam  Constitutional: She is oriented to person, place, and time. She appears well-developed and well-nourished.  HENT:  Head: Normocephalic.  Right Ear: External ear normal.  Left Ear: External ear normal.  Mouth/Throat: Oropharynx is clear and moist.  Eyes: Conjunctivae and EOM are normal. Pupils are equal, round, and reactive to light.  Neck: Normal range of  motion. Neck supple. No thyromegaly present.  Cardiovascular: Normal rate, regular rhythm, normal heart sounds and intact distal pulses.   Pulmonary/Chest: Effort normal and breath sounds normal.  Abdominal: Soft. Bowel sounds are normal. She exhibits mass. There is no tenderness.       Patient had a large nonreducible lower midline mass consistent with a ventral hernia  Musculoskeletal: Normal range of motion.  Lymphadenopathy:    She has no cervical adenopathy.  Neurological: She is alert and oriented to person, place, and time.  Skin: Skin is warm and dry. No rash noted.       4 x 5 mm pigmented lesion left upper anterior chest  Psychiatric: She has a normal mood and affect. Her behavior is normal.          Assessment & Plan:  Probable ventral hernia. Nonreducible. The patient is reluctant to see a general surgeon. We'll check abdominal ultrasound to confirm this is a hernia Hypertension controlled Chronic low back pain Pigmented lesion left upper anterior chest. This appears to be unchanged but dermatology referral again recommended

## 2010-11-09 NOTE — Patient Instructions (Signed)
Abdominal ultrasound as discussed Dermatology referral Limit your sodium (Salt) intake  Smoking tobacco is very bad for your health. You should stop smoking immediately.    It is important that you exercise regularly, at least 20 minutes 3 to 4 times per week.  If you develop chest pain or shortness of breath seek  medical attention.  Return in 6 months for follow-up

## 2010-11-13 ENCOUNTER — Ambulatory Visit
Admission: RE | Admit: 2010-11-13 | Discharge: 2010-11-13 | Disposition: A | Discharge status: Home / Self Care | Payer: Medicare Other | Source: Ambulatory Visit | Attending: Internal Medicine | Admitting: Internal Medicine

## 2010-11-13 DIAGNOSIS — K439 Ventral hernia without obstruction or gangrene: Secondary | ICD-10-CM

## 2010-11-13 NOTE — Progress Notes (Signed)
Quick Note:  Spoke with pt - informed of results - inquiring about what needed to be done, ______

## 2010-11-15 ENCOUNTER — Telehealth: Payer: Self-pay | Admitting: Internal Medicine

## 2010-11-15 DIAGNOSIS — K469 Unspecified abdominal hernia without obstruction or gangrene: Secondary | ICD-10-CM

## 2010-11-15 NOTE — Progress Notes (Signed)
Quick Note:  Spoke with pt- informed of results and suggestion for surgical referral - she would like to set that up for eval. KIK ______

## 2010-11-15 NOTE — Telephone Encounter (Signed)
Was given test results from [facility]. Patient stated that Selena Batten was going to get back with her about some additional info from Dr Kirtland Bouchard. She called today with anxiety.she would like a call back today for Dr Kirtland Bouchard or Selena Batten about this matter. Thanks.

## 2010-11-15 NOTE — Telephone Encounter (Signed)
Please call.

## 2010-11-15 NOTE — Telephone Encounter (Signed)
Spoke with pt - uncomfortable and would like to be set up for surgical eval. Order done. KIK

## 2010-11-27 ENCOUNTER — Ambulatory Visit (INDEPENDENT_AMBULATORY_CARE_PROVIDER_SITE_OTHER): Payer: Medicare Other | Admitting: Surgery

## 2010-11-27 ENCOUNTER — Other Ambulatory Visit: Payer: Self-pay | Admitting: Internal Medicine

## 2010-11-27 ENCOUNTER — Encounter (INDEPENDENT_AMBULATORY_CARE_PROVIDER_SITE_OTHER): Payer: Self-pay | Admitting: Surgery

## 2010-11-27 VITALS — BP 142/84 | HR 76 | Temp 97.6°F | Resp 18 | Ht 59.0 in | Wt 182.6 lb

## 2010-11-27 DIAGNOSIS — F17200 Nicotine dependence, unspecified, uncomplicated: Secondary | ICD-10-CM

## 2010-11-27 DIAGNOSIS — E669 Obesity, unspecified: Secondary | ICD-10-CM

## 2010-11-27 DIAGNOSIS — Z5320 Procedure and treatment not carried out because of patient's decision for unspecified reasons: Secondary | ICD-10-CM | POA: Insufficient documentation

## 2010-11-27 DIAGNOSIS — K436 Other and unspecified ventral hernia with obstruction, without gangrene: Secondary | ICD-10-CM

## 2010-11-27 NOTE — Progress Notes (Signed)
Subjective:     Patient ID: Sally Douglas, female   DOB: [birth year], 65 y.o.   MRN: [id number]  HPI  Patient Care Team: Rogelia Boga as PCP - General  This patient is a 65 y.o.female who presents today for surgical evaluation.   Reason for visit: Upper abdominal mass. Probable incarcerated hernia.  Patient is a pleasant very active morbidly obese smoking female. She saw a lump in her upper abdomen for a few years. It has gradually gotten larger. She initially downplayed how much to bother her but did note if she coughs or some bumps up against it it is bothersome. No nausea or vomiting. Daily bowel movements. No prior abdominal surgeries. Her husband especially is then turned to get her to consider surgery. She confesses she does not like doctors and tends to want to avoid interactions with them. She feels that she is rather healthy.  Her primary care physician leaned towards having surgery as well. They performed an ultrasound per request. The fascial defect looks like around 4 cm.  Past Medical History  Diagnosis Date  . Hypertension   . Insomnia   . Obesity   . Menopausal syndrome   . Tobacco abuse   . Low back pain   . Bronchitis   . Impaired glucose tolerance   . Abnormal finding on Pap smear     hx abnl pap    Past Surgical History  Procedure Date  . Gyn surgery   . Tonsillectomy   . Dilation and curettage of uterus     History   Social History  . Marital Status: Married    Spouse Name: N/A    Number of Children: N/A  . Years of Education: N/A   Occupational History  . Not on file.   Social History Main Topics  . Smoking status: Current Everyday Smoker -- 0.5 packs/day    Types: Cigarettes  . Smokeless tobacco: Never Used  . Alcohol Use: No  . Drug Use: No  . Sexually Active: Not on file   Other Topics Concern  . Not on file   Social History Narrative   gavidia 1Para 1 aborta 0    Family History  Problem Relation Age of Onset  . Cancer  Brother     renal ,also sister  . Diabetes Brother     and sister  . Breast cancer      sister  . Kidney disease    . Cancer Mother     Breast Cancer    Current outpatient prescriptions:acetaminophen (TYLENOL) 500 MG tablet, Take 500 mg by mouth every 6 (six) hours as needed.  , Disp: , Rfl: ;  aspirin 81 MG tablet, Take 81 mg by mouth daily.  , Disp: , Rfl: ;  diltiazem (TIAZAC) 360 MG 24 hr capsule, Take 1 capsule (360 mg total) by mouth daily., Disp: 90 capsule, Rfl: 6;  hydrochlorothiazide (HYDRODIURIL) 25 MG tablet, Take 1 tablet (25 mg total) by mouth daily., Disp: 90 tablet, Rfl: 6 HYDROcodone-acetaminophen (VICODIN) 5-500 MG per tablet, Take 1 tablet by mouth every 8 (eight) hours as needed for pain., Disp: 90 tablet, Rfl: 3;  meloxicam (MOBIC) 15 MG tablet, Take 1 tablet (15 mg total) by mouth daily., Disp: 90 tablet, Rfl: 6;  zolpidem (AMBIEN) 10 MG tablet, Take 1 tablet (10 mg total) by mouth at bedtime as needed., Disp: 30 tablet, Rfl: 4  No Known Allergies     Review of Systems  Constitutional: Negative for fever, chills,  diaphoresis, appetite change and fatigue.  HENT: Negative for ear pain, sore throat, trouble swallowing, neck pain and ear discharge.   Eyes: Negative for photophobia, discharge and visual disturbance.  Respiratory: Negative for cough, choking, chest tightness, shortness of breath, wheezing and stridor.   Cardiovascular: Negative for chest pain, palpitations and leg swelling.       Patient walks 60 minutes for about 2 miles without difficulty.  No exertional chest/neck/shoulder/arm pain.   Gastrointestinal: Negative for nausea, vomiting, abdominal pain, diarrhea, constipation, anal bleeding and rectal pain.       BM daily.    NEVER HAD A COLONOSCOPY  Genitourinary: Negative for dysuria, frequency and difficulty urinating.  Musculoskeletal: Negative for myalgias and gait problem.  Skin: Negative for color change, pallor and rash.  Neurological: Negative  for dizziness, speech difficulty, weakness and numbness.  Hematological: Negative for adenopathy.  Psychiatric/Behavioral: Negative for confusion and agitation. The patient is not nervous/anxious.        Objective:   Physical Exam  Constitutional: She is oriented to person, place, and time. She appears well-developed and well-nourished. No distress.  HENT:  Head: Normocephalic.  Mouth/Throat: Oropharynx is clear and moist. No oropharyngeal exudate.  Eyes: Conjunctivae and EOM are normal. Pupils are equal, round, and reactive to light. No scleral icterus.  Neck: Normal range of motion. Neck supple. No tracheal deviation present.  Cardiovascular: Normal rate, regular rhythm and intact distal pulses.   Pulmonary/Chest: Effort normal and breath sounds normal. No respiratory distress. She exhibits no tenderness.  Abdominal: Soft. She exhibits mass. She exhibits no distension. There is no tenderness. There is no rebound and no guarding. Hernia confirmed negative in the right inguinal area and confirmed negative in the left inguinal area.       12X12CM supraumb mass.   Not redicible.  Morbidly obese  Genitourinary: No vaginal discharge found.  Musculoskeletal: Normal range of motion. She exhibits no tenderness.  Lymphadenopathy:    She has no cervical adenopathy.       Right: No inguinal adenopathy present.       Left: No inguinal adenopathy present.  Neurological: She is alert and oriented to person, place, and time. No cranial nerve deficit. She exhibits normal muscle tone. Coordination normal.  Skin: Skin is warm and dry. No rash noted. She is not diaphoretic. No erythema.  Psychiatric: She has a normal mood and affect. Her behavior is normal. Judgment and thought content normal.       Assessment:     Incarcerated ventral hernia. Morbidly obese smoker. No prior abdominal surgeries.    Plan:     I think this requires surgical repair. She can be started laparoscopically. Her morbid  obesity and smoking are increasing risks on complications. I strongly recommend she stopped smoking. She was apprised ear that this was an outpatient procedure. I suspect given her comorbidities and the size that she'll need to stay at least overnight. I noted if her pain controlled with pills and she is eating and walking well, she can go home. Foley that's not too long. Certainly the risks of emergency surgery her far higher. I cautioned her it's going to take a few months to fully get over this but am hopeful that she can be rather functional rather quickly as long as she is aggressive with pain control. She seems motivated to one recover. Her husband wishes to have her have surgery.  The anatomy & physiology of the abdominal wall was discussed.  The pathophysiology of hernias  was discussed.  Natural history risks without surgery of enlargement, pain, incarceration & strangulation was discussed.   Contributors to complications such as smoking, obesity, diabetes, prior surgery, etc were discussed.  I feel the risks of no intervention will lead to serious problems that outweigh the operative risks; therefore, I recommended surgery to reduce and repair the hernia.  I explained laparoscopic techniques with possible need for an open approach.  I noted the probable use of mesh to patch and/or buttress hernia repair  Risks such as bleeding, infection, abscess, need for further treatment, heart attack, death, and other risks were discussed.  Goals of post-operative recovery were discussed as well.  Possibility that this will not correct all symptoms was explained.  I stressed the importance of low-impact activity, aggressive pain control, avoiding constipation, & not pushing through pain to minimize risk of post-operative chronic pain or injury. Possibility of reherniation was discussed.  We will work to minimize complications.   An educational handout further explaining the pathology & treatment options was given as  well.  Questions were answered.  The patient expresses understanding & wishes to proceed with surgery.  We talked to the patient about the dangers of smoking.  We stressed that tobacco use dramatically increases the risk of peri-operative complications such as infection, tissue necrosis leaving to problems with incision/wound and organ healing, heart attack, stroke, DVT, pulmonary embolism, and death.  We noted there are programs in our community to help stop smoking.  I strongly recommend that she get a colonoscopy. I noted no enjoys it, but it better than getting a cancer that requires emergency surgery or kills her.[name]

## 2010-11-27 NOTE — Patient Instructions (Addendum)
Hernia, Surgical Repair A hernia occurs when an internal organ pushes out through a weak spot in the belly (abdominal) wall muscles. Hernias commonly occur in the groin and around the navel. Hernias often can be pushed back into place (reduced). Most hernias tend to get worse over time. Problems occur when abdominal contents get stuck in the opening (incarcerated hernia). The blood supply gets cut off (strangulated hernia). This is an emergency and needs surgery. Otherwise, hernia repair can be an elective procedure. This means you can schedule this at your convenience when an emergency is not present. Because complications can occur, if you decide to repair the hernia, it is best to do it soon. When it becomes an emergency procedure, there is increased risk of complications after surgery. CAUSES   Heavy lifting.   Obesity.   Prolonged coughing.   Straining to move your bowels.   Hernias can also occur through a cut (incision) by a surgeonafter an abdominal operation.  HOME CARE INSTRUCTIONS Before the repair:  Bed rest is not required. You may continue your normal activities, but avoid heavy lifting (more than 10 pounds) or straining. Cough gently. If you are a smoker, it is best to stop. Even the best hernia repair can break down with the continual strain of coughing.   Do not wear anything tight over your hernia. Do not try to keep it in with an outside bandage or truss. These can damage abdominal contents if they are trapped in the hernia sac.   Eat a normal diet. Avoid constipation. Straining over long periods of time to have a bowel movement will increase hernia size. It also can breakdown repairs. If you cannot do this with diet alone, laxatives or stool softeners may be used.  PRIOR TO SURGERY, SEEK IMMEDIATE MEDICAL CARE IF: You have problems (symptoms) of a trapped (incarcerated) hernia. Symptoms include:  An oral temperature above 102 F (38.9 C) develops, or as your caregiver  suggests.   Increasing abdominal pain.   Feeling sick to your stomach(nausea) and vomiting.   You stop passing gas or stool.   The hernia is stuck outside the abdomen, looks discolored, feels hard, or is tender.   You have any changes in your bowel habits or in the hernia that is unusual for you.  LET YOUR CAREGIVERS KNOW ABOUT THE FOLLOWING:  Allergies.   Medications taken including herbs, eye drops, over the counter medications, and creams.   Use of steroids (by mouth or creams).   Family or personal history of problems with anesthetics or Novocaine.   Possibility of pregnancy, if this applies.   Personal history of blood clots (thrombophlebitis).   Family or personal history of bleeding or blood problems.   Previous surgery.   Other health problems.  BEFORE THE PROCEDURE You should be present 1 hour prior to your procedure, or as directed by your caregiver.  AFTER THE PROCEDURE After surgery, you will be taken to the recovery area. A nurse will watch and check your progress there. Once you are awake, stable, and taking fluids well, you will be allowed to go home as long as there are no problems. Once home, an ice pack (wrapped in a light towel) applied to your operative site may help with discomfort. It may also keep the swelling down. Do not lift anything heavier than 10 pounds (4.55 kilograms). Take showers not baths. Do not drive while taking narcotics. Follow instructions as suggested by your caregiver.  SEEK IMMEDIATE MEDICAL CARE IF: After   surgery:  There is redness, swelling, or increasing pain in the wound.   There is pus coming from the wound.   There is drainage from a wound lasting longer than 1 day.   An unexplained oral temperature above 102 F (38.9 C) develops.   You notice a foul smell coming from the wound or dressing.   There is a breaking open of a wound (edged not staying together) after the sutures have been removed.   You notice increasing  pain in the shoulders (shoulder strap areas).   You develop dizzy episodes or fainting while standing.   You develop persistent nausea or vomiting.   You develop a rash.   You have difficulty breathing.   You develop any reaction or side effects to medications given.  MAKE SURE YOU:   Understand these instructions.   Will watch your condition.   Will get help right away if you are not doing well or get worse.  Document Released: 07/10/2000 Document Revised: 09/26/2010 Document Reviewed: 06/02/2007  Smoking Cessation, Tips for Success YOU CAN QUIT SMOKING If you are ready to quit smoking, congratulations! You have chosen to help yourself be healthier. Cigarettes bring nicotine, tar, carbon monoxide, and other irritants into your body. Your lungs, heart, and blood vessels will be able to work better without these poisons. There are many different ways to quit smoking. Nicotine gum, nicotine patches, a nicotine inhaler, or nicotine nasal spray can help with physical craving. Hypnosis, support groups, and medicines help break the habit of smoking. Here are some tips to help you quit for good.  Throw away all cigarettes.   Clean and remove all ashtrays from your home, work, and car.   On a card, write down your reasons for quitting. Carry the card with you and read it when you get the urge to smoke.   Cleanse your body of nicotine. Drink enough water and fluids to keep your urine clear or pale yellow. Do this after quitting to flush the nicotine from your body.   Learn to predict your moods. Do not let a bad situation be your excuse to have a cigarette. Some situations in your life might tempt you into wanting a cigarette.   Never have "just one" cigarette. It leads to wanting another and another. Remind yourself of your decision to quit.   Change habits associated with smoking. If you smoked while driving or when feeling stressed, try other activities to replace smoking. Stand up when  drinking your coffee. Brush your teeth after eating. Sit in a different chair when you read the paper. Avoid alcohol while trying to quit, and try to drink fewer caffeinated beverages. Alcohol and caffeine may urge you to smoke.   Avoid foods and drinks that can trigger a desire to smoke, such as sugary or spicy foods and alcohol.   Ask people who smoke not to smoke around you.   Have something planned to do right after eating or having a cup of coffee. Take a walk or exercise to perk you up. This will help to keep you from overeating.   Try a relaxation exercise to calm you down and decrease your stress. Remember, you may be tense and nervous for the first 2 weeks after you quit, but this will pass.   Find new activities to keep your hands busy. Play with a pen, coin, or rubber band. Doodle or draw things on paper.   Brush your teeth right after eating. This will help cut  down on the craving for the taste of tobacco after meals. You can try mouthwash, too.   Use oral substitutes, such as lemon drops, carrots, a cinnamon stick, or chewing gum, in place of cigarettes. Keep them handy so they are available when you have the urge to smoke.   When you have the urge to smoke, try deep breathing.   Designate your home as a nonsmoking area.   If you are a heavy smoker, ask your caregiver about a prescription for nicotine chewing gum. It can ease your withdrawal from nicotine.   Reward yourself. Set aside the cigarette money you save and buy yourself something nice.   Look for support from others. Join a support group or smoking cessation program. Ask someone at home or at work to help you with your plan to quit smoking.   Always ask yourself, "Do I need this cigarette or is this just a reflex?" Tell yourself, "Today, I choose not to smoke," or "I do not want to smoke." You are reminding yourself of your decision to quit, even if you do smoke a cigarette.  HOW WILL I FEEL WHEN I QUIT  SMOKING?  The benefits of not smoking start within days of quitting.   You may have symptoms of withdrawal because your body is used to nicotine (the addictive substance in cigarettes). You may crave cigarettes, be irritable, feel very hungry, cough often, get headaches, or have difficulty concentrating.   The withdrawal symptoms are only temporary. They are strongest when you first quit but will go away within 10 to 14 days.   When withdrawal symptoms occur, stay in control. Think about your reasons for quitting. Remind yourself that these are signs that your body is healing and getting used to being without cigarettes.   Remember that withdrawal symptoms are easier to treat than the major diseases that smoking can cause.   Even after the withdrawal is over, expect periodic urges to smoke. However, these cravings are generally short-lived and will go away whether you smoke or not. Do not smoke!   If you relapse and smoke again, do not lose hope. Most smokers quit 3 times before they are successful.   If you relapse, do not give up! Plan ahead and think about what you will do the next time you get the urge to smoke.  LIFE AS A NONSMOKER: MAKE IT FOR A MONTH, MAKE IT FOR LIFE Day 1: Hang this page where you will see it every day. Day 2: Get rid of all ashtrays, matches, and lighters. Day 3: Drink water. Breathe deeply between sips. Day 4: Avoid places with smoke-filled air, such as bars, clubs, or the smoking section of restaurants. Day 5: Keep track of how much money you save by not smoking. Day 6: Avoid boredom. Keep a good book with you or go to the movies. Day 7: Reward yourself! One week without smoking! Day 8: Make a dental appointment to get your teeth cleaned. Day 9: Decide how you will turn down a cigarette before it is offered to you. Day 10: Review your reasons for quitting. Day 11: Distract yourself. Stay active to keep your mind off smoking and to relieve tension. Take a walk,  exercise, read a book, do a crossword puzzle, or try a new hobby. Day 12: Exercise. Get off the bus before your stop or use stairs instead of escalators. Day 13: Call on friends for support and encouragement. Day 14: Reward yourself! Two weeks without smoking! Day  15: Practice deep breathing exercises. Day 16: Bet a friend that you can stay a nonsmoker. Day 17: Ask to sit in nonsmoking sections of restaurants. Day 18: Hang up "No Smoking" signs. Day 19: Think of yourself as a nonsmoker. Day 20: Each morning, tell yourself you will not smoke. Day 21: Reward yourself! Three weeks without smoking! Day 22: Think of smoking in negative ways. Remember how it stains your teeth, gives you bad breath, and leaves you short of breath. Day 23: Eat a nutritious breakfast. Day 24:Do not relive your days as a smoker. Day 25: Hold a pencil in your hand when talking on the telephone. Day 26: Tell all your friends you do not smoke. Day 27: Think about how much better food tastes. Day 28: Remember, one cigarette is one too many. Day 29: Take up a hobby that will keep your hands busy. Day 30: Congratulations! One month without smoking! Give yourself a big reward. Your caregiver can direct you to community resources or hospitals for support, which may include:  Group support.   Education.   Hypnosis.   Subliminal therapy.  Document Released: 10/13/2003 Document Revised: 09/26/2010 Document Reviewed: 10/31/2008 [facility] Patient Information 2012 [place], [place].  GET A COLONOSCOPY A colonoscopy is an exam to evaluate your entire colon. In this exam, your colon is cleansed. A long fiberoptic tube is inserted through your rectum and into your colon. The fiberoptic scope (endoscope) is a long bundle of enclosed and very flexible fibers. These fibers transmit light to the area examined and send images from that area to your caregiver. Discomfort is usually minimal. You may be given a drug to help you sleep  (sedative) during or prior to the procedure. This exam helps to detect lumps (tumors), polyps, inflammation, and areas of bleeding. Your caregiver may also take a small piece of tissue (biopsy) that will be examined under a microscope. LET YOUR CAREGIVER KNOW ABOUT:   Allergies to food or medicine.   Medicines taken, including vitamins, herbs, eyedrops, over-the-counter medicines, and creams.   Use of steroids (by mouth or creams).   Previous problems with anesthetics or numbing medicines.   History of bleeding problems or blood clots.   Previous surgery.   Other health problems, including diabetes and kidney problems.   Possibility of pregnancy, if this applies.  BEFORE THE PROCEDURE   A clear liquid diet may be required for 2 days before the exam.   Ask your caregiver about changing or stopping your regular medications.   Liquid injections (enemas) or laxatives may be required.   A large amount of electrolyte solution may be given to you to drink over a short period of time. This solution is used to clean out your colon.   You should be present 60 minutes prior to your procedure or as directed by your caregiver.  AFTER THE PROCEDURE   If you received a sedative or pain relieving medication, you will need to arrange for someone to drive you home.   Occasionally, there is a little blood passed with the first bowel movement. Do not be concerned.  FINDING OUT THE RESULTS OF YOUR TEST Not all test results are available during your visit. If your test results are not back during the visit, make an appointment with your caregiver to find out the results. Do not assume everything is normal if you have not heard from your caregiver or the medical facility. It is important for you to follow up on all of your test  results. HOME CARE INSTRUCTIONS   It is not unusual to pass moderate amounts of gas and experience mild abdominal cramping following the procedure. This is due to air being  used to inflate your colon during the exam. Walking or a warm pack on your belly (abdomen) may help.   You may resume all normal meals and activities after sedatives and medicines have worn off.   Only take over-the-counter or prescription medicines for pain, discomfort, or fever as directed by your caregiver. Do not use aspirin or blood thinners if a biopsy was taken. Consult your caregiver for medicine usage if biopsies were taken.  SEEK IMMEDIATE MEDICAL CARE IF:   You have a fever.   You pass large blood clots or fill a toilet with blood following the procedure. This may also occur 10 to 14 days following the procedure. This is more likely if a biopsy was taken.   You develop abdominal pain that keeps getting worse and cannot be relieved with medicine.  Document Released: 01/12/2000 Document Revised: 09/26/2010 Document Reviewed: 08/27/2007 [facility] Patient Information 2012 [place], [place].

## 2010-11-27 NOTE — Telephone Encounter (Signed)
Also pt needs refill on meloxicam 15mg  , hctz 25mg ,diltiazem 360mg 

## 2011-01-11 ENCOUNTER — Encounter (HOSPITAL_COMMUNITY): Payer: Self-pay | Admitting: Pharmacy Technician

## 2011-01-16 ENCOUNTER — Encounter (HOSPITAL_COMMUNITY)
Admission: RE | Admit: 2011-01-16 | Discharge: 2011-01-16 | Disposition: A | Discharge status: Home / Self Care | Payer: Medicare Other | Source: Ambulatory Visit | Attending: Surgery | Admitting: Surgery

## 2011-01-16 ENCOUNTER — Encounter (HOSPITAL_COMMUNITY): Payer: Self-pay

## 2011-01-16 ENCOUNTER — Ambulatory Visit (HOSPITAL_COMMUNITY)
Admission: RE | Admit: 2011-01-16 | Discharge: 2011-01-16 | Disposition: A | Discharge status: Home / Self Care | Payer: Medicare Other | Source: Ambulatory Visit | Attending: Surgery | Admitting: Surgery

## 2011-01-16 DIAGNOSIS — Z01812 Encounter for preprocedural laboratory examination: Secondary | ICD-10-CM | POA: Insufficient documentation

## 2011-01-16 DIAGNOSIS — Z01818 Encounter for other preprocedural examination: Secondary | ICD-10-CM | POA: Insufficient documentation

## 2011-01-16 LAB — CBC
HCT: 42 % (ref 36.0–46.0)
MCH: 30.5 pg (ref 26.0–34.0)
MCV: 90.7 fL (ref 78.0–100.0)
Platelets: 287 10*3/uL (ref 150–400)
RBC: 4.63 MIL/uL (ref 3.87–5.11)
WBC: 10.4 10*3/uL (ref 4.0–10.5)

## 2011-01-16 LAB — BASIC METABOLIC PANEL
BUN: 18 mg/dL (ref 6–23)
CO2: 29 mEq/L (ref 19–32)
Calcium: 10.4 mg/dL (ref 8.4–10.5)
Chloride: 100 mEq/L (ref 96–112)
Creatinine, Ser: 0.91 mg/dL (ref 0.50–1.10)

## 2011-01-16 NOTE — Patient Instructions (Signed)
20 Solae Norling  01/16/2011   Your procedure is scheduled on:  01/24/2011  Report to [facility] at 0530 AM.  Call this number if you have problems the morning of surgery: [phone]   Remember:Use Fleets enema night before surgery as instructed by Dr. Gordy Savers office   Do not eat food:After Midnight.  May have clear liquids:until Midnight .  Clear liquids include soda, tea, black coffee, apple or grape juice, broth.  Take these medicines the morning of surgery with A SIP OF WATER: Diltiazem,use opthalmic drops   Do not wear jewelry, make-up or nail polish.  Do not wear lotions, powders, or perfumes.   Do not shave 48 hours prior to surgery.  Do not bring valuables to the hospital.  Contacts, dentures or bridgework may not be worn into surgery.  Leave suitcase in the car. After surgery it may be brought to your room.  For patients admitted to the hospital, checkout time is 11:00 AM the day of discharge.   Patients discharged the day of surgery will not be allowed to drive home.  Name and phone number of your driver:   Special Instructions: CHG Shower Use Special Wash: 1/2 bottle night before surgery and 1/2 bottle morning of surgery.   Please read over the following fact sheets that you were given: MRSA Information

## 2011-01-23 MED ORDER — BUPIVACAINE 0.25 % ON-Q PUMP DUAL CATH 300 ML
300.0000 mL | INJECTION | Status: DC
  Administered 2011-01-24: 300 mL
  Filled 2011-01-23: qty 300

## 2011-01-24 ENCOUNTER — Encounter (HOSPITAL_COMMUNITY)
Admission: RE | Disposition: A | Discharge status: Home / Self Care | Payer: Self-pay | Source: Ambulatory Visit | Attending: Surgery

## 2011-01-24 ENCOUNTER — Other Ambulatory Visit (INDEPENDENT_AMBULATORY_CARE_PROVIDER_SITE_OTHER): Payer: Self-pay | Admitting: Surgery

## 2011-01-24 ENCOUNTER — Encounter (HOSPITAL_COMMUNITY): Payer: Self-pay | Admitting: *Deleted

## 2011-01-24 ENCOUNTER — Encounter (HOSPITAL_COMMUNITY): Payer: Self-pay | Admitting: Anesthesiology

## 2011-01-24 ENCOUNTER — Ambulatory Visit (HOSPITAL_COMMUNITY)
Admission: RE | Admit: 2011-01-24 | Discharge: 2011-01-25 | Disposition: A | Discharge status: Home / Self Care | Payer: Medicare Other | Source: Ambulatory Visit | Attending: Surgery | Admitting: Surgery

## 2011-01-24 ENCOUNTER — Ambulatory Visit (HOSPITAL_COMMUNITY): Payer: Medicare Other | Admitting: Anesthesiology

## 2011-01-24 DIAGNOSIS — K430 Incisional hernia with obstruction, without gangrene: Secondary | ICD-10-CM

## 2011-01-24 DIAGNOSIS — Z79899 Other long term (current) drug therapy: Secondary | ICD-10-CM | POA: Insufficient documentation

## 2011-01-24 DIAGNOSIS — I10 Essential (primary) hypertension: Secondary | ICD-10-CM | POA: Insufficient documentation

## 2011-01-24 DIAGNOSIS — Z7982 Long term (current) use of aspirin: Secondary | ICD-10-CM | POA: Insufficient documentation

## 2011-01-24 DIAGNOSIS — E66813 Obesity, class 3: Secondary | ICD-10-CM | POA: Insufficient documentation

## 2011-01-24 DIAGNOSIS — K436 Other and unspecified ventral hernia with obstruction, without gangrene: Secondary | ICD-10-CM | POA: Insufficient documentation

## 2011-01-24 HISTORY — PX: VENTRAL HERNIA REPAIR: SHX424

## 2011-01-24 SURGERY — REPAIR, HERNIA, VENTRAL, LAPAROSCOPIC
Anesthesia: General | Wound class: Clean

## 2011-01-24 MED ORDER — HYDROMORPHONE HCL PF 1 MG/ML IJ SOLN
0.2500 mg | INTRAMUSCULAR | Status: DC | PRN
  Administered 2011-01-24 (×4): 0.5 mg via INTRAVENOUS

## 2011-01-24 MED ORDER — MELOXICAM 15 MG PO TABS
15.0000 mg | ORAL_TABLET | Freq: Every day | ORAL | Status: DC
  Administered 2011-01-24 – 2011-01-25 (×2): 15 mg via ORAL
  Filled 2011-01-24 (×2): qty 1

## 2011-01-24 MED ORDER — HYDROMORPHONE HCL PF 1 MG/ML IJ SOLN
0.5000 mg | INTRAMUSCULAR | Status: DC | PRN
  Administered 2011-01-24 – 2011-01-25 (×7): 1 mg via INTRAVENOUS
  Filled 2011-01-24 (×7): qty 1

## 2011-01-24 MED ORDER — DILTIAZEM HCL ER BEADS 120 MG PO CP24
360.0000 mg | ORAL_CAPSULE | ORAL | Status: DC
  Administered 2011-01-25: 360 mg via ORAL
  Filled 2011-01-24 (×3): qty 1

## 2011-01-24 MED ORDER — DIPHENHYDRAMINE HCL 50 MG/ML IJ SOLN
12.5000 mg | Freq: Four times a day (QID) | INTRAMUSCULAR | Status: DC | PRN
  Administered 2011-01-25 (×2): 12.5 mg via INTRAVENOUS
  Filled 2011-01-24 (×2): qty 1

## 2011-01-24 MED ORDER — NEOSTIGMINE METHYLSULFATE 1 MG/ML IJ SOLN
INTRAMUSCULAR | Status: DC | PRN
  Administered 2011-01-24: 3 mg via INTRAVENOUS

## 2011-01-24 MED ORDER — SUCCINYLCHOLINE CHLORIDE 20 MG/ML IJ SOLN
INTRAMUSCULAR | Status: DC | PRN
  Administered 2011-01-24: 100 mg via INTRAVENOUS

## 2011-01-24 MED ORDER — ONDANSETRON HCL 4 MG/2ML IJ SOLN: 4.0000 mg | Freq: Four times a day (QID) | INTRAMUSCULAR | Status: DC | PRN

## 2011-01-24 MED ORDER — GUAIFENESIN-DM 100-10 MG/5ML PO SYRP: 15.0000 mL | ORAL_SOLUTION | ORAL | Status: DC | PRN

## 2011-01-24 MED ORDER — ASPIRIN EC 81 MG PO TBEC
81.0000 mg | DELAYED_RELEASE_TABLET | Freq: Every day | ORAL | Status: DC
  Administered 2011-01-24 – 2011-01-25 (×2): 81 mg via ORAL
  Filled 2011-01-24 (×2): qty 1

## 2011-01-24 MED ORDER — LACTATED RINGERS IV SOLN
INTRAVENOUS | Status: DC | PRN
  Administered 2011-01-24 (×2): via INTRAVENOUS

## 2011-01-24 MED ORDER — MIDAZOLAM HCL 5 MG/5ML IJ SOLN
INTRAMUSCULAR | Status: DC | PRN
  Administered 2011-01-24: 1 mg via INTRAVENOUS

## 2011-01-24 MED ORDER — PROPOFOL 10 MG/ML IV EMUL
INTRAVENOUS | Status: DC | PRN
  Administered 2011-01-24: 150 mg via INTRAVENOUS
  Administered 2011-01-24: 50 mg via INTRAVENOUS

## 2011-01-24 MED ORDER — BUPIVACAINE-EPINEPHRINE 0.25% -1:200000 IJ SOLN
INTRAMUSCULAR | Status: DC | PRN
  Administered 2011-01-24: 50 mL

## 2011-01-24 MED ORDER — MAGIC MOUTHWASH
15.0000 mL | Freq: Four times a day (QID) | ORAL | Status: DC | PRN
  Filled 2011-01-24: qty 15

## 2011-01-24 MED ORDER — ONDANSETRON 8 MG/NS 50 ML IVPB
8.0000 mg | Freq: Four times a day (QID) | INTRAVENOUS | Status: DC | PRN
  Filled 2011-01-24: qty 8

## 2011-01-24 MED ORDER — GLYCOPYRROLATE 0.2 MG/ML IJ SOLN
INTRAMUSCULAR | Status: DC | PRN
  Administered 2011-01-24: .4 mg via INTRAVENOUS

## 2011-01-24 MED ORDER — CISATRACURIUM BESYLATE 2 MG/ML IV SOLN
INTRAVENOUS | Status: DC | PRN
  Administered 2011-01-24: 5 mg via INTRAVENOUS
  Administered 2011-01-24: 1 mg via INTRAVENOUS
  Administered 2011-01-24: 3 mg via INTRAVENOUS
  Administered 2011-01-24: 1 mg via INTRAVENOUS

## 2011-01-24 MED ORDER — HYDROCHLOROTHIAZIDE 25 MG PO TABS
25.0000 mg | ORAL_TABLET | ORAL | Status: DC
  Administered 2011-01-24 – 2011-01-25 (×2): 25 mg via ORAL
  Filled 2011-01-24 (×3): qty 1

## 2011-01-24 MED ORDER — OXYCODONE HCL 5 MG PO TABS
5.0000 mg | ORAL_TABLET | ORAL | Status: DC | PRN
  Administered 2011-01-24: 10 mg via ORAL
  Administered 2011-01-24: 5 mg via ORAL
  Administered 2011-01-25 (×3): 10 mg via ORAL
  Filled 2011-01-24: qty 2
  Filled 2011-01-24: qty 1
  Filled 2011-01-24 (×3): qty 2

## 2011-01-24 MED ORDER — CEFAZOLIN SODIUM-DEXTROSE 2-3 GM-% IV SOLR: 2.0000 g | INTRAVENOUS | Status: DC

## 2011-01-24 MED ORDER — LACTATED RINGERS IV SOLN: INTRAVENOUS | Status: DC

## 2011-01-24 MED ORDER — ASPIRIN 81 MG PO TABS
81.0000 mg | ORAL_TABLET | ORAL | Status: DC
  Filled 2011-01-24 (×2): qty 1

## 2011-01-24 MED ORDER — TETRAHYDROZOLINE HCL 0.05 % OP SOLN
2.0000 [drp] | Freq: Every day | OPHTHALMIC | Status: DC
  Administered 2011-01-25: 2 [drp] via OPHTHALMIC
  Filled 2011-01-24: qty 15

## 2011-01-24 MED ORDER — LIDOCAINE HCL (CARDIAC) 20 MG/ML IV SOLN
INTRAVENOUS | Status: DC | PRN
  Administered 2011-01-24: 30 mg via INTRAVENOUS

## 2011-01-24 MED ORDER — PROMETHAZINE HCL 25 MG/ML IJ SOLN: 12.5000 mg | Freq: Four times a day (QID) | INTRAMUSCULAR | Status: DC | PRN

## 2011-01-24 MED ORDER — HYDROMORPHONE BOLUS VIA INFUSION: 0.5000 mg | INTRAVENOUS | Status: DC | PRN

## 2011-01-24 MED ORDER — PSYLLIUM 95 % PO PACK
1.0000 | PACK | Freq: Two times a day (BID) | ORAL | Status: DC
  Administered 2011-01-24 – 2011-01-25 (×3): 1 via ORAL
  Filled 2011-01-24 (×4): qty 1

## 2011-01-24 MED ORDER — ALUM & MAG HYDROXIDE-SIMETH 200-200-20 MG/5ML PO SUSP: 30.0000 mL | Freq: Four times a day (QID) | ORAL | Status: DC | PRN

## 2011-01-24 MED ORDER — HYDROMORPHONE HCL PF 1 MG/ML IJ SOLN
INTRAMUSCULAR | Status: DC | PRN
  Administered 2011-01-24 (×2): 0.5 mg via INTRAVENOUS
  Administered 2011-01-24: 1 mg via INTRAVENOUS

## 2011-01-24 MED ORDER — BISACODYL 10 MG RE SUPP: 10.0000 mg | Freq: Two times a day (BID) | RECTAL | Status: DC | PRN

## 2011-01-24 MED ORDER — LACTATED RINGERS IV SOLN
INTRAVENOUS | Status: DC
  Administered 2011-01-24 – 2011-01-25 (×2): via INTRAVENOUS

## 2011-01-24 MED ORDER — PROMETHAZINE HCL 25 MG/ML IJ SOLN: 6.2500 mg | INTRAMUSCULAR | Status: DC | PRN

## 2011-01-24 MED ORDER — BUPIVACAINE 0.25 % ON-Q PUMP DUAL CATH 300 ML: 300.0000 mL | INJECTION | Status: DC

## 2011-01-24 MED ORDER — ONDANSETRON HCL 4 MG PO TABS: 4.0000 mg | ORAL_TABLET | Freq: Four times a day (QID) | ORAL | Status: DC | PRN

## 2011-01-24 MED ORDER — NAPROXEN 500 MG PO TABS
500.0000 mg | ORAL_TABLET | Freq: Two times a day (BID) | ORAL | Status: DC
  Administered 2011-01-24 – 2011-01-25 (×2): 500 mg via ORAL
  Filled 2011-01-24 (×3): qty 1

## 2011-01-24 MED ORDER — HEPARIN SODIUM (PORCINE) 5000 UNIT/ML IJ SOLN
5000.0000 [IU] | Freq: Three times a day (TID) | INTRAMUSCULAR | Status: DC
  Administered 2011-01-24 – 2011-01-25 (×3): 5000 [IU] via SUBCUTANEOUS
  Filled 2011-01-24 (×6): qty 1

## 2011-01-24 MED ORDER — KETOROLAC TROMETHAMINE 30 MG/ML IJ SOLN
INTRAMUSCULAR | Status: DC | PRN
  Administered 2011-01-24: 30 mg via INTRAVENOUS

## 2011-01-24 MED ORDER — ZOLPIDEM TARTRATE 10 MG PO TABS: 10.0000 mg | ORAL_TABLET | Freq: Every evening | ORAL | Status: DC | PRN

## 2011-01-24 MED ORDER — ONDANSETRON HCL 4 MG/2ML IJ SOLN
INTRAMUSCULAR | Status: DC | PRN
  Administered 2011-01-24 (×2): 2 mg via INTRAVENOUS

## 2011-01-24 MED ORDER — ACETAMINOPHEN 10 MG/ML IV SOLN
INTRAVENOUS | Status: DC | PRN
  Administered 2011-01-24: 1000 mg via INTRAVENOUS

## 2011-01-24 MED ORDER — CEFAZOLIN SODIUM 1-5 GM-% IV SOLN
INTRAVENOUS | Status: DC | PRN
  Administered 2011-01-24: 2 g via INTRAVENOUS

## 2011-01-24 MED ORDER — FENTANYL CITRATE 0.05 MG/ML IJ SOLN
INTRAMUSCULAR | Status: DC | PRN
  Administered 2011-01-24 (×5): 50 ug via INTRAVENOUS

## 2011-01-24 MED ORDER — STERILE WATER FOR IRRIGATION IR SOLN
Status: DC | PRN
  Administered 2011-01-24: 1500 mL

## 2011-01-24 SURGICAL SUPPLY — 49 items
APPLIER CLIP 5 13 M/L LIGAMAX5 (MISCELLANEOUS)
APR CLP MED LRG 5 ANG JAW (MISCELLANEOUS)
BINDER ABD UNIV 12 45-62 (WOUND CARE) IMPLANT
BINDER ABDOMINAL 46IN 62IN (WOUND CARE) ×2
CANISTER SUCTION 2500CC (MISCELLANEOUS) ×1 IMPLANT
CATH KIT ON-Q SILVERSOAK 7.5 (CATHETERS) IMPLANT
CATH KIT ON-Q SILVERSOAK 7.5IN (CATHETERS) ×4 IMPLANT
CLIP APPLIE 5 13 M/L LIGAMAX5 (MISCELLANEOUS) IMPLANT
CLOSURE STERI STRIP 1/2 X4 (GAUZE/BANDAGES/DRESSINGS) ×1 IMPLANT
CLOTH BEACON ORANGE TIMEOUT ST (SAFETY) ×2 IMPLANT
COVER SURGICAL LIGHT HANDLE (MISCELLANEOUS) ×1 IMPLANT
DECANTER SPIKE VIAL GLASS SM (MISCELLANEOUS) ×2 IMPLANT
DEVICE SECURE STRAP 25 ABSORB (INSTRUMENTS) ×1 IMPLANT
DEVICE TROCAR PUNCTURE CLOSURE (ENDOMECHANICALS) ×1 IMPLANT
DRAPE LAPAROSCOPIC ABDOMINAL (DRAPES) ×2 IMPLANT
DRSG TEGADERM 2-3/8X2-3/4 SM (GAUZE/BANDAGES/DRESSINGS) ×4 IMPLANT
DRSG TEGADERM 4X4.75 (GAUZE/BANDAGES/DRESSINGS) ×1 IMPLANT
ELECT REM PT RETURN 9FT ADLT (ELECTROSURGICAL) ×2
ELECTRODE REM PT RTRN 9FT ADLT (ELECTROSURGICAL) ×1 IMPLANT
FILTER SMOKE EVAC LAPAROSHD (FILTER) IMPLANT
GAUZE SPONGE 2X2 8PLY STRL LF (GAUZE/BANDAGES/DRESSINGS) IMPLANT
GLOVE ECLIPSE 8.0 STRL XLNG CF (GLOVE) ×2 IMPLANT
GLOVE INDICATOR 8.0 STRL GRN (GLOVE) ×4 IMPLANT
GOWN STRL NON-REIN LRG LVL3 (GOWN DISPOSABLE) ×4 IMPLANT
GOWN STRL REIN XL XLG (GOWN DISPOSABLE) ×3 IMPLANT
HAND ACTIVATED (MISCELLANEOUS) IMPLANT
KIT BASIN OR (CUSTOM PROCEDURE TRAY) ×2 IMPLANT
MESH PHYSIO OVAL 15X20CM (Mesh General) ×1 IMPLANT
NDL SPNL 22GX3.5 QUINCKE BK (NEEDLE) IMPLANT
NEEDLE SPNL 22GX3.5 QUINCKE BK (NEEDLE) ×2 IMPLANT
NS IRRIG 1000ML POUR BTL (IV SOLUTION) ×1 IMPLANT
PEN SKIN MARKING BROAD (MISCELLANEOUS) ×2 IMPLANT
PENCIL BUTTON HOLSTER BLD 10FT (ELECTRODE) IMPLANT
SCISSORS LAP 5X35 DISP (ENDOMECHANICALS) ×2 IMPLANT
SET IRRIG TUBING LAPAROSCOPIC (IRRIGATION / IRRIGATOR) IMPLANT
SLEEVE Z-THREAD 5X100MM (TROCAR) ×4 IMPLANT
SPONGE GAUZE 2X2 STER 10/PKG (GAUZE/BANDAGES/DRESSINGS) ×1
STRIP CLOSURE SKIN 1/2X4 (GAUZE/BANDAGES/DRESSINGS) IMPLANT
SUT MNCRL AB 4-0 PS2 18 (SUTURE) ×2 IMPLANT
SUT PROLENE 1 CT 1 30 (SUTURE) ×9 IMPLANT
SUT VIC AB 2-0 UR6 27 (SUTURE) IMPLANT
TOWEL OR 17X26 10 PK STRL BLUE (TOWEL DISPOSABLE) ×2 IMPLANT
TRAY FOLEY CATH 14FRSI W/METER (CATHETERS) IMPLANT
TRAY LAP CHOLE (CUSTOM PROCEDURE TRAY) ×2 IMPLANT
TROCAR XCEL BLADELESS 5X75MML (TROCAR) ×2 IMPLANT
TROCAR Z-THREAD FIOS 11X100 BL (TROCAR) ×2 IMPLANT
TROCAR Z-THREAD FIOS 5X100MM (TROCAR) ×2 IMPLANT
TUBING INSUFFLATION 10FT LAP (TUBING) ×2 IMPLANT
TUNNELER SHEATH ON-Q 16GX12 DP (PAIN MANAGEMENT) ×1 IMPLANT

## 2011-01-24 NOTE — Anesthesia Preprocedure Evaluation (Signed)
Anesthesia Evaluation  Patient identified by MRN, date of birth, ID band Patient awake    Reviewed: Allergy & Precautions, H&P , NPO status , Patient's Chart, lab work & pertinent test results, reviewed documented beta blocker date and time   Airway Mallampati: II TM Distance: >3 FB Neck ROM: Full    Dental  (+) Partial Upper   Pulmonary Current Smoker,  clear to auscultation        Cardiovascular hypertension, Pt. on medications Regular Normal Denies cardiac symptoms   Neuro/Psych Negative Neurological ROS  Negative Psych ROS   GI/Hepatic negative GI ROS, Neg liver ROS,   Endo/Other  Obeisty  Renal/GU negative Renal ROS  Genitourinary negative   Musculoskeletal negative musculoskeletal ROS (+)   Abdominal   Peds negative pediatric ROS (+)  Hematology negative hematology ROS (+)   Anesthesia Other Findings   Reproductive/Obstetrics negative OB ROS                           Anesthesia Physical Anesthesia Plan  ASA: III  Anesthesia Plan: General   Post-op Pain Management:    Induction: Intravenous  Airway Management Planned: Oral ETT  Additional Equipment:   Intra-op Plan:   Post-operative Plan: Extubation in OR  Informed Consent: I have reviewed the patients History and Physical, chart, labs and discussed the procedure including the risks, benefits and alternatives for the proposed anesthesia with the patient or authorized representative who has indicated his/her understanding and acceptance.     Plan Discussed with: CRNA and Surgeon  Anesthesia Plan Comments:         Anesthesia Quick Evaluation

## 2011-01-24 NOTE — H&P (Signed)
HPI  Patient Care Team:  Rogelia Boga as PCP - General    Reason for visit: Upper abdominal mass. Probable incarcerated hernia.  Patient is a pleasant very active morbidly obese smoking female. She saw a lump in her upper abdomen for a few years. It has gradually gotten larger. She initially downplayed how much to bother her but did note if she coughs or some bumps up against it it is bothersome. No nausea or vomiting. Daily bowel movements. No prior abdominal surgeries. Her husband especially is then turned to get her to consider surgery. She confesses she does not like doctors and tends to want to avoid interactions with them. She feels that she is rather healthy.  Her primary care physician leaned towards having surgery as well. They performed an ultrasound per request. The fascial defect looks like around 4 cm.  Past Medical History   Diagnosis  Date   .  Hypertension    .  Insomnia    .  Obesity    .  Menopausal syndrome    .  Tobacco abuse    .  Low back pain    .  Bronchitis    .  Impaired glucose tolerance    .  Abnormal finding on Pap smear      hx abnl pap    Past Surgical History   Procedure  Date   .  Gyn surgery    .  Tonsillectomy    .  Dilation and curettage of uterus     History    Social History   .  Marital Status:  Married     Spouse Name:  N/A     Number of Children:  N/A   .  Years of Education:  N/A    Occupational History   .  Not on file.    Social History Main Topics   .  Smoking status:  Current Everyday Smoker -- 0.5 packs/day     Types:  Cigarettes   .  Smokeless tobacco:  Never Used   .  Alcohol Use:  No   .  Drug Use:  No   .  Sexually Active:  Not on file    Other Topics  Concern   .  Not on file    Social History Narrative    gavidia 1Para 1 aborta 0    Family History   Problem  Relation  Age of Onset   .  Cancer  Brother       renal ,also sister    .  Diabetes  Brother       and sister    .  Breast cancer         sister    .  Kidney disease     .  Cancer  Mother       Breast Cancer    Current outpatient prescriptions:acetaminophen (TYLENOL) 500 MG tablet, Take 500 mg by mouth every 6 (six) hours as needed. , Disp: , Rfl: ; aspirin 81 MG tablet, Take 81 mg by mouth daily. , Disp: , Rfl: ; diltiazem (TIAZAC) 360 MG 24 hr capsule, Take 1 capsule (360 mg total) by mouth daily., Disp: 90 capsule, Rfl: 6; hydrochlorothiazide (HYDRODIURIL) 25 MG tablet, Take 1 tablet (25 mg total) by mouth daily., Disp: 90 tablet, Rfl: 6  HYDROcodone-acetaminophen (VICODIN) 5-500 MG per tablet, Take 1 tablet by mouth every 8 (eight) hours as needed for pain., Disp: 90 tablet, Rfl: 3; meloxicam (MOBIC)  15 MG tablet, Take 1 tablet (15 mg total) by mouth daily., Disp: 90 tablet, Rfl: 6; zolpidem (AMBIEN) 10 MG tablet, Take 1 tablet (10 mg total) by mouth at bedtime as needed., Disp: 30 tablet, Rfl: 4  No Known Allergies  Review of Systems  Constitutional: Negative for fever, chills, diaphoresis, appetite change and fatigue.  HENT: Negative for ear pain, sore throat, trouble swallowing, neck pain and ear discharge.  Eyes: Negative for photophobia, discharge and visual disturbance.  Respiratory: Negative for cough, choking, chest tightness, shortness of breath, wheezing and stridor.  Cardiovascular: Negative for chest pain, palpitations and leg swelling.  Patient walks 60 minutes for about 2 miles without difficulty. No exertional chest/neck/shoulder/arm pain.  Gastrointestinal: Negative for nausea, vomiting, abdominal pain, diarrhea, constipation, anal bleeding and rectal pain.  BM daily.  NEVER HAD A COLONOSCOPY  Genitourinary: Negative for dysuria, frequency and difficulty urinating.  Musculoskeletal: Negative for myalgias and gait problem.  Skin: Negative for color change, pallor and rash.  Neurological: Negative for dizziness, speech difficulty, weakness and numbness.  Hematological: Negative for adenopathy.   Psychiatric/Behavioral: Negative for confusion and agitation. The patient is not nervous/anxious.    Objective:   Physical Exam  Constitutional: She is oriented to person, place, and time. She appears well-developed and well-nourished. No distress.  HENT:  Head: Normocephalic.  Mouth/Throat: Oropharynx is clear and moist. No oropharyngeal exudate.  Eyes: Conjunctivae and EOM are normal. Pupils are equal, round, and reactive to light. No scleral icterus.  Neck: Normal range of motion. Neck supple. No tracheal deviation present.  Cardiovascular: Normal rate, regular rhythm and intact distal pulses.  Pulmonary/Chest: Effort normal and breath sounds normal. No respiratory distress. She exhibits no tenderness.  Abdominal: Soft. She exhibits mass. She exhibits no distension. There is no tenderness. There is no rebound and no guarding. Hernia confirmed negative in the right inguinal area and confirmed negative in the left inguinal area.  12X12CM supraumb mass. Not redicible. Morbidly obese  Genitourinary: No vaginal discharge found.  Musculoskeletal: Normal range of motion. She exhibits no tenderness.  Lymphadenopathy:  She has no cervical adenopathy.  Right: No inguinal adenopathy present.  Left: No inguinal adenopathy present.  Neurological: She is alert and oriented to person, place, and time. No cranial nerve deficit. She exhibits normal muscle tone. Coordination normal.  Skin: Skin is warm and dry. No rash noted. She is not diaphoretic. No erythema.  Psychiatric: She is anxious but consolable. Her behavior is normal. Judgment and thought content normal.    Assessment:    Incarcerated ventral hernia. Morbidly obese smoker. No prior abdominal surgeries.   Plan:    I think this requires surgical repair. She can be started laparoscopically. Her morbid obesity and smoking are increasing risks on complications. I strongly recommend she stopped smoking. She was apprised ear that this was an  outpatient procedure. I suspect given her comorbidities and the size that she'll need to stay at least overnight. I noted if her pain controlled with pills and she is eating and walking well, she can go home.   Certainly the risks of emergency surgery her far higher. I cautioned her it's going to take a few months to fully get over this but am hopeful that she can be rather functional rather quickly as long as she is aggressive with pain control. She seems motivated to recover. Her husband wishes to have her have surgery.   The anatomy & physiology of the abdominal wall was discussed. The pathophysiology of hernias  was discussed. Natural history risks without surgery of enlargement, pain, incarceration & strangulation was discussed. Contributors to complications such as smoking, obesity, diabetes, prior surgery, etc were discussed. I feel the risks of no intervention will lead to serious problems that outweigh the operative risks; therefore, I recommended surgery to reduce and repair the hernia. I explained laparoscopic techniques with possible need for an open approach. I noted the probable use of mesh to patch and/or buttress hernia repair  Risks such as bleeding, infection, abscess, need for further treatment, heart attack, death, and other risks were discussed. Goals of post-operative recovery were discussed as well. Possibility that this will not correct all symptoms was explained. I stressed the importance of low-impact activity, aggressive pain control, avoiding constipation, & not pushing through pain to minimize risk of post-operative chronic pain or injury. Possibility of reherniation was discussed. We will work to minimize complications. An educational handout further explaining the pathology & treatment options was given as well. Questions were answered. The patient expresses understanding & wishes to proceed with surgery.   We talked to the patient about the dangers of smoking. We stressed that  tobacco use dramatically increases the risk of peri-operative complications such as infection, tissue necrosis leaving to problems with incision/wound and organ healing, heart attack, stroke, DVT, pulmonary embolism, and death. We noted there are programs in our community to help stop smoking.  I strongly recommend that she get a colonoscopy. I noted no enjoys it, but it better than getting a cancer that requires emergency surgery or kills her.[name]

## 2011-01-24 NOTE — Transfer of Care (Signed)
Immediate Anesthesia Transfer of Care Note  Patient: Sally Douglas  Procedure(s) Performed:  LAPAROSCOPIC VENTRAL HERNIA - with Mesh  Patient Location: PACU  Anesthesia Type: General  Level of Consciousness: awake and alert   Airway & Oxygen Therapy: Patient Spontanous Breathing and Patient connected to face mask  Post-op Assessment: Report given to PACU RN  Post vital signs: Reviewed and stable  Complications: No apparent anesthesia complications

## 2011-01-24 NOTE — Progress Notes (Signed)
Pt did fleets enema 01/23/11 pm with good results

## 2011-01-24 NOTE — Anesthesia Postprocedure Evaluation (Signed)
  Anesthesia Post-op Note  Patient: Sally Douglas  Procedure(s) Performed:  LAPAROSCOPIC VENTRAL HERNIA - with Mesh  Patient Location: PACU  Anesthesia Type: General  Level of Consciousness: oriented and sedated  Airway and Oxygen Therapy: Patient Spontanous Breathing and Patient connected to nasal cannula oxygen  Post-op Pain: mild  Post-op Assessment: Post-op Vital signs reviewed, Patient's Cardiovascular Status Stable, Respiratory Function Stable and Patent Airway  Post-op Vital Signs: stable  Complications: No apparent anesthesia complications

## 2011-01-24 NOTE — Brief Op Note (Signed)
01/24/2011  9:25 AM  PATIENT:  Lilyan Punt  65 y.o. female  Patient Care Team: Rogelia Boga as PCP - General  PRE-OPERATIVE DIAGNOSIS:  ventral wall hernia, incarcerated supraumbilical  POST-OPERATIVE DIAGNOSIS:  ventral wall hernia, incarcerated supraumbilical  PROCEDURE:  Procedure(s): LAPAROSCOPIC VENTRAL HERNIA repair with mesh  SURGEON:  Surgeon(s): Ardeth Sportsman, MD  ASSISTANTS: none   ANESTHESIA:   local and general  EBL:     BLOOD ADMINISTERED:none  DRAINS: none   LOCAL MEDICATIONS USED:  BUPIVICAINE 50mL  SPECIMEN:  No Specimen  DISPOSITION OF SPECIMEN:  N/A  COUNTS:  YES  TOURNIQUET:  * No tourniquets in log *  DICTATION: .Other Dictation: Dictation Number [id number]  PLAN OF CARE: Admit for overnight observation  PATIENT DISPOSITION:  PACU - hemodynamically stable.   Delay start of Pharmacological VTE agent (>24hrs) due to surgical blood loss or risk of bleeding:  NO

## 2011-01-24 NOTE — Discharge Instructions (Signed)
HERNIA REPAIR: POST OP INSTRUCTIONS  1. DIET: Follow a light bland diet the first 24 hours after arrival home, such as soup, liquids, crackers, etc.  Be sure to include lots of fluids daily.  Avoid fast food or heavy meals as your are more likely to get nauseated.  Eat a low fat the next few days after surgery. 2. Take your usually prescribed home medications unless otherwise directed. 3. PAIN CONTROL: a. Pain is best controlled by a usual combination of three different methods TOGETHER: i. Ice/Heat ii. Over the counter pain medication iii. Prescription pain medication b. Most patients will experience some swelling and bruising around the hernia(s) such as the bellybutton, groins, or old incisions.  Ice packs or heating pads (30-60 minutes up to 6 times a day) will help. Use ice for the first few days to help decrease swelling and bruising, then switch to heat to help relax tight/sore spots and speed recovery.  Some people prefer to use ice alone, heat alone, alternating between ice & heat.  Experiment to what works for you.  Swelling and bruising can take several weeks to resolve.   c. It is helpful to take an over-the-counter pain medication regularly for the first few weeks.  Choose one of the following that works best for you: i. Naproxen (Aleve, etc)  Two 220mg tabs twice a day ii. Ibuprofen (Advil, etc) Three 200mg tabs four times a day (every meal & bedtime) iii. Acetaminophen (Tylenol, etc) 325-650mg four times a day (every meal & bedtime) d. A  prescription for pain medication should be given to you upon discharge.  Take your pain medication as prescribed.  i. If you are having problems/concerns with the prescription medicine (does not control pain, nausea, vomiting, rash, itching, etc), please call us [phone] to see if we need to switch you to a different pain medicine that will work better for you and/or control your side effect better. ii. If you need a refill on your pain  medication, please contact your pharmacy.  They will contact our office to request authorization. Prescriptions will not be filled after 5 pm or on week-ends. 4. Avoid getting constipated.  Between the surgery and the pain medications, it is common to experience some constipation.  Increasing fluid intake and taking a fiber supplement (such as Metamucil, Citrucel, FiberCon, MiraLax, etc) 1-2 times a day regularly will usually help prevent this problem from occurring.  A mild laxative (prune juice, Milk of Magnesia, MiraLax, etc) should be taken according to package directions if there are no bowel movements after 48 hours.   5. Wash / shower every day.  You may shower over the dressings as they are waterproof.   6. Remove your waterproof bandages 5 days after surgery.  You may leave the incision open to air.  You may replace a dressing/Band-Aid to cover the incision for comfort if you wish.  Continue to shower over incision(s) after the dressing is off.    7. ACTIVITIES as tolerated:   a. You may resume regular (light) daily activities beginning the next day-such as daily self-care, walking, climbing stairs-gradually increasing activities as tolerated.  If you can walk 30 minutes without difficulty, it is safe to try more intense activity such as jogging, treadmill, bicycling, low-impact aerobics, swimming, etc. b. Save the most intensive and strenuous activity for last such as sit-ups, heavy lifting, contact sports, etc  Refrain from any heavy lifting or straining until you are off narcotics for pain control.     c. DO NOT PUSH THROUGH PAIN.  Let pain be your guide: If it hurts to do something, don't do it.  Pain is your body warning you to avoid that activity for another week until the pain goes down. d. You may drive when you are no longer taking prescription pain medication, you can comfortably wear a seatbelt, and you can safely maneuver your car and apply brakes. e. You may have sexual intercourse  when it is comfortable.  8. FOLLOW UP in our office a. Please call [facility] at [phone] to set up an appointment to see your surgeon in the office for a follow-up appointment approximately 2-3 weeks after your surgery. b. Make sure that you call for this appointment the day you arrive home to insure a convenient appointment time. 9.  IF YOU HAVE DISABILITY OR FAMILY LEAVE FORMS, BRING THEM TO THE OFFICE FOR PROCESSING.  DO NOT GIVE THEM TO YOUR DOCTOR.  WHEN TO CALL US [phone]: 1. Poor pain control 2. Reactions / problems with new medications (rash/itching, nausea, etc)  3. Fever over 101.5 F (38.5 C) 4. Inability to urinate 5. Nausea and/or vomiting 6. Worsening swelling or bruising 7. Continued bleeding from incision. 8. Increased pain, redness, or drainage from the incision   The clinic staff is available to answer your questions during regular business hours (8:30am-5pm).  Please don't hesitate to call and ask to speak to one of our nurses for clinical concerns.   If you have a medical emergency, go to the nearest emergency room or call 911.  A surgeon from [facility] is always on call at the hospitals in [place]  [facility] [address], [address], [place], [place]  [address] ?  [address] [address], [place], [place]   [address] MAIN: [phone] ? TOLL FREE: [phone] ? FAX: [phone] [email or web address]  

## 2011-01-24 NOTE — Op Note (Signed)
Sally Douglas, Sally Douglas NO.:  [id number]  MEDICAL RECORD NO.:  [id number]  LOCATION:  1529                         FACILITY:  [facility]  PHYSICIAN:  Ardeth Sportsman, MD     DATE OF BIRTH:  02/17/1945  DATE OF PROCEDURE:  01/24/2011 DATE OF DISCHARGE:                              OPERATIVE REPORT   PRIMARY CARE PHYSICIAN:  Gordy Savers, MD  SURGEON:  Ardeth Sportsman, MD  ASSISTANT:  RN  PREOPERATIVE DIAGNOSIS:  Incarcerated supraumbilical ventral wall hernia.  POSTOPERATIVE DIAGNOSIS:  Incarcerated supraumbilical ventral wall hernia.[name]  PROCEDURE PERFORMED:  Laparoscopic reduction and repair of a laparoscopic ventral hernia with mesh.  ANESTHESIA: 1. General anesthesia. 2. Local anesthetic and field block around all port sites and fascial     stitch sites. 3. On-Q continuous preperitoneal bupivacaine catheters.  SPECIMENS:  Hernia sac (not sent).  DRAINS:  None.  ESTIMATED BLOOD LOSS:  Minimal.  COMPLICATIONS:  None apparent.  INDICATIONS:  Ms. Parkinson is a 65 year old obese female with no prior surgeries who has had a painful mass above her belly button that has increased in size.  It is not reducible.  Examination was concerning for an incarcerated ventral hernia.  Pathophysiology of herniation with its natural history, risks of incarceration, strangulation were discussed.  Given its increasing size, pain, and discomfort, I recommended diagnostic laparoscopy with reduction and repair with mesh.  Technique, risks, benefits, alternatives were discussed.  Alternatives of the surgery with emergent repair was discussed as well.  Questions answered and she and her husband agreed to proceed.  OPERATIVE FINDINGS:  She had a 5 x 4 cm supraumbilical ventral hernia along her midline, incarcerated with a large volume of greater omentum. Transverse colon was near the entry but not actually into it.  There was no evidence of any  strangulation.  DESCRIPTION OF PROCEDURE:  Informed consent was confirmed.  The patient received IV cefazolin.  She voided prior to coming in to the operating room.  She underwent general anesthesia without any difficulty.  She had sequential compression devices active during the entire case.  She was positioned supine with arms tucked.  Her abdomen was prepped and draped in a sterile fashion.  Surgical time-out confirmed our plan.  I placed a #5 mm port in the left upper quadrant using optical entry technique with the patient in steep reverse Trendelenburg and left side up.  Entry was clean.  Camera inspection revealed no injury.  I placed a 10 mm port in the left flank and a 5 mm port in the left lower quadrant.  I turned attention to the abdomen.  I could see a large swath of omentum incarcerated into the abdominal wall.  I used sharp focussed cautery scissors to help free the adhesions around the hernia sac.  I gradually was able to reduce a large volume of greater omentum, over half of her entire greater omentum was incarcerated up into the defect, and reduce it down.  I did trim the falciform ligament since it was going up into that area and trimmed it off its attachments to the anterior abdominal wall and followed it down towards the umbilicus.  With that, I could reduce a moderate volume of umbilical hernia sac as well.  It looked a little ischemic, so I went ahead and trimmed the hernia sac off at the end of the falciform ligament to the exit point of the liver edge to avoid a potential adhesion and internal hernia.  I measured out the defect.  I chose a 20 x 15 polypropylene/Monocryl (Physiomesh) mesh.  I brought it in the abdominal cavity and secured it to the anterior abdominal wall using 16 #1 Prolene interrupted stitches around the periphery.  The spacing was usually about every 4 cm around the periphery yielding good result.  This provided at least 2  inches circumferential coverage around the fascial defect.  Because the defect was not massive, I did not want to increase wound issues by primarily closing on top of it, so I left that alone.  I secured the mesh with absorbable SecureStrap tacks around the periphery as well.  I placed the On-Q catheters under direct laparoscopic visualization in the preperitoneal plane and from the subxiphoid region running underneath the costal ridge down towards the left hip.  I did inspection.  Hemostasis was excellent.  There was no injury to bowel or colon or any other abnormalities.  I went ahead and evacuated carbon dioxide and removed the ports.  I placed the catheters within the On-Q tunnelers.  The puncture sites were closed with Steri-Strips for the fascial stitches and the port sites were closed using Monocryl.  The 10 mm port site, I did close with 0 Vicryl stitch.  I had done that using laparoscopic suture passer under direct visualization just prior to evacuating gas.  The patient is being extubated.  If her pain is adequately controlled, she can leave later today.  However, I anticipate she will probably need to stay overnight.     Ardeth Sportsman, MD     SCG/MEDQ  D:  01/24/2011  T:  01/24/2011  Job:  [id number]  cc:   Gordy Savers, MD [address] [place] [address]

## 2011-01-25 ENCOUNTER — Encounter (HOSPITAL_COMMUNITY): Payer: Self-pay | Admitting: Surgery

## 2011-01-25 MED ORDER — OXYCODONE HCL 5 MG PO TABS: 5.0000 mg | ORAL_TABLET | ORAL | Status: AC | PRN

## 2011-01-25 NOTE — Progress Notes (Signed)
Pt has been educated regarding correct use of Incentive Spirometer (IS). Pt verbalizes understanding of correct use. Pt states that there is no further questions regarding correct use of the IS. Rafan Sanders Lorraine Maze Corniel, RN    

## 2011-01-25 NOTE — Progress Notes (Signed)
Patient given discharge instructions. No concerns at discharge. Abd Binder on On Q Pump intact instructed on how to remove. Rx given.

## 2011-01-25 NOTE — Discharge Summary (Signed)
Patient ID: Saumya Hukill MRN: [id number] DOB/AGE: 28-Feb-1945 65 y.o.  Admit date: 01/24/2011 Discharge date: 01/25/2011  Procedures:  Laparoscopic ventral hernia/umbilical hernia repair  Consults: none  Reason for Admission:  This is a 65yo female who was electively set up for ventral hernia repair.  Admission Diagnoses: 1. Incarcerated ventral hernia 2. Morbid obesity 3. HTN  Hospital Course: The patient was admitted and taken to the operating room for a laparoscopic ventral hernia repair.  She tolerated the procedure well.  On POD# 1 she was tolerating a regular diet.  Her pain was being controlled with po meds and occasional IV meds.  She felt that she was okay to be discharged home and felt that she could control her pain well enough with po narcotics.  She was passing flatus but had not had a BM yet.  PE: Abd: soft, tender, +BS, obese.  Multiple small incisions that are c/d/i with steri-strips present.  OnQ ball in place. Ht: regular Lungs: CTAB  Discharge Diagnoses:  Principal Problem:  *Incarcerated ventral hernia, upper abdomen Active Problems:  OBESITY   Discharge Medications: Current Discharge Medication List    START taking these medications   Details  oxyCODONE (OXY IR/ROXICODONE) 5 MG immediate release tablet Take 1-2 tablets (5-10 mg total) by mouth every 4 (four) hours as needed. Qty: 40 tablet, Refills: 0      CONTINUE these medications which have NOT CHANGED   Details  acetaminophen (TYLENOL) 500 MG tablet Take 500-1,000 mg by mouth every 6 (six) hours as needed. PAIN     aspirin 81 MG tablet Take 81 mg by mouth every morning.     diltiazem (TIAZAC) 360 MG 24 hr capsule Take 360 mg by mouth every morning.     docusate sodium (COLACE) 100 MG capsule Take 100 mg by mouth daily as needed. CONSTIPATION     hydrochlorothiazide (HYDRODIURIL) 25 MG tablet Take 25 mg by mouth every morning.     meloxicam (MOBIC) 15 MG tablet Take 1 tablet (15 mg total) by  mouth daily. Qty: 90 tablet, Refills: 6    tetrahydrozoline 0.05 % ophthalmic solution Place 2 drops into both eyes daily.     zolpidem (AMBIEN) 10 MG tablet Take 10 mg by mouth at bedtime as needed. SLEEP       STOP taking these medications     HYDROcodone-acetaminophen (VICODIN) 5-500 MG per tablet         Discharge Instructions: Follow-up Information    Follow up with Kenzee Bassin C., MD in 3 weeks.   Contact information:   [facility] [address] [place] [address] [phone]          Signed: Letha Cape 01/25/2011, 2:28 PM  Ardeth Sportsman, M.D., F.A.C.S. Gastrointestinal and Minimally Invasive Surgery [facility] [address] [place], [place] [address] [phone] Main / Paging [phone] Voice Mail

## 2011-01-30 ENCOUNTER — Telehealth (INDEPENDENT_AMBULATORY_CARE_PROVIDER_SITE_OTHER): Payer: Self-pay | Admitting: General Surgery

## 2011-01-30 DIAGNOSIS — Z8719 Personal history of other diseases of the digestive system: Secondary | ICD-10-CM

## 2011-01-30 MED ORDER — HYDROCODONE-ACETAMINOPHEN 5-325 MG PO TABS: 1.0000 | ORAL_TABLET | Freq: Four times a day (QID) | ORAL | Status: AC | PRN

## 2011-01-30 NOTE — Telephone Encounter (Signed)
Pt called in stating she had sx on 01/24/11 and out of pain medication. Asked patient if she can take protocol, and she said it was fine. Asked question due to current prescription that was issued at hospital needed to be written. Pt stated she has also had nausea & vomiting, advised I will check with one of the doctors to see what she can take that may help. Pt confirmed contact number of [phone].

## 2011-01-30 NOTE — Telephone Encounter (Signed)
Called patient after talking with Dr. Dwain Sarna. He advised she can take an OTC for the nausea. Pt confirmed she is able to pass gas and does not have any bloating of abdomen. Stated she ate breakfast but feels nauseated. I advised she stay away from oily and spicy foods. And told patient to contact us if she develops any signs of infection or symptoms get worse.  Advised patient protocol medication will be called in for her at [facility] [phone].

## 2011-02-13 ENCOUNTER — Telehealth (INDEPENDENT_AMBULATORY_CARE_PROVIDER_SITE_OTHER): Payer: Self-pay

## 2011-02-13 NOTE — Telephone Encounter (Signed)
Called pt to notify her that we did refill her Norco 5/325mg  #40 to [facility] [phone] [place]. We faxed the request to [phone].

## 2011-02-18 ENCOUNTER — Encounter (INDEPENDENT_AMBULATORY_CARE_PROVIDER_SITE_OTHER): Payer: Self-pay | Admitting: Surgery

## 2011-02-18 ENCOUNTER — Ambulatory Visit (INDEPENDENT_AMBULATORY_CARE_PROVIDER_SITE_OTHER): Payer: Medicare Other | Admitting: Surgery

## 2011-02-18 VITALS — BP 136/90 | HR 84 | Temp 98.0°F | Resp 18 | Ht <= 58 in | Wt 181.2 lb

## 2011-02-18 DIAGNOSIS — E669 Obesity, unspecified: Secondary | ICD-10-CM

## 2011-02-18 DIAGNOSIS — K436 Other and unspecified ventral hernia with obstruction, without gangrene: Secondary | ICD-10-CM

## 2011-02-18 DIAGNOSIS — F17200 Nicotine dependence, unspecified, uncomplicated: Secondary | ICD-10-CM

## 2011-02-18 MED ORDER — HYDROCODONE-ACETAMINOPHEN 10-325 MG PO TABS: 1.0000 | ORAL_TABLET | ORAL | Status: DC | PRN

## 2011-02-18 MED ORDER — AMITRIPTYLINE HCL 25 MG PO TABS: 50.0000 mg | ORAL_TABLET | Freq: Every day | ORAL | Status: DC

## 2011-02-18 NOTE — Patient Instructions (Signed)
Managing Pain  Pain after surgery or related to activity is often due to strain/injury to muscle, tendon, nerves and/or incisions.  This pain is usually short-term and will improve in a few months.   Many people find it helpful to do the following things TOGETHER to help speed the process of healing and to get back to regular activity more quickly:  1. Avoid heavy physical activity a.  no lifting greater than 20 pounds b. Do not "push through" the pain.  Listen to your body and avoid positions and maneuvers than reproduce the pain c. Walking is okay as tolerated, but go slowly and stop when getting sore.  d. Remember: If it hurts to do it, then don't do it! 2. Take Anti-inflammatory medication  a. Take with food/snack around the clock for 1-2 weeks i. This helps the muscle and nerve tissues become less irritable and calm down faster b. Choose ONE of the following over-the-counter medications: i. Naproxen 220mg tabs (ex. Aleve) 1-2 pills twice a day  ii. Ibuprofen 200mg tabs (ex. Advil, Motrin) 3-4 pills with every meal and just before bedtime iii. Acetaminophen 500mg tabs (Tylenol) 1-2 pills with every meal and just before bedtime 3. Use a Heating pad or Ice/Cold Pack a. 4-6 times a day b. May use warm bath/hottub  or showers 4. Try Gentle Massage and/or Stretching  a. at the area of pain many times a day b. stop if you feel pain - do not overdo it  Try these steps together to help you body heal faster and avoid making things get worse.  Doing just one of these things may not be enough.    If you are not getting better after two weeks or are noticing you are getting worse, contact our office for further advice; we may need to re-evaluate you & see what other things we can do to help.  Smoking Cessation, Tips for Success YOU CAN QUIT SMOKING If you are ready to quit smoking, congratulations! You have chosen to help yourself be healthier. Cigarettes bring nicotine, tar, carbon monoxide,  and other irritants into your body. Your lungs, heart, and blood vessels will be able to work better without these poisons. There are many different ways to quit smoking. Nicotine gum, nicotine patches, a nicotine inhaler, or nicotine nasal spray can help with physical craving. Hypnosis, support groups, and medicines help break the habit of smoking. Here are some tips to help you quit for good.  Throw away all cigarettes.   Clean and remove all ashtrays from your home, work, and car.   On a card, write down your reasons for quitting. Carry the card with you and read it when you get the urge to smoke.   Cleanse your body of nicotine. Drink enough water and fluids to keep your urine clear or pale yellow. Do this after quitting to flush the nicotine from your body.   Learn to predict your moods. Do not let a bad situation be your excuse to have a cigarette. Some situations in your life might tempt you into wanting a cigarette.   Never have "just one" cigarette. It leads to wanting another and another. Remind yourself of your decision to quit.   Change habits associated with smoking. If you smoked while driving or when feeling stressed, try other activities to replace smoking. Stand up when drinking your coffee. Brush your teeth after eating. Sit in a different chair when you read the paper. Avoid alcohol while trying to quit, and   try to drink fewer caffeinated beverages. Alcohol and caffeine may urge you to smoke.   Avoid foods and drinks that can trigger a desire to smoke, such as sugary or spicy foods and alcohol.   Ask people who smoke not to smoke around you.   Have something planned to do right after eating or having a cup of coffee. Take a walk or exercise to perk you up. This will help to keep you from overeating.   Try a relaxation exercise to calm you down and decrease your stress. Remember, you may be tense and nervous for the first 2 weeks after you quit, but this will pass.   Find  new activities to keep your hands busy. Play with a pen, coin, or rubber band. Doodle or draw things on paper.   Brush your teeth right after eating. This will help cut down on the craving for the taste of tobacco after meals. You can try mouthwash, too.   Use oral substitutes, such as lemon drops, carrots, a cinnamon stick, or chewing gum, in place of cigarettes. Keep them handy so they are available when you have the urge to smoke.   When you have the urge to smoke, try deep breathing.   Designate your home as a nonsmoking area.   If you are a heavy smoker, ask your caregiver about a prescription for nicotine chewing gum. It can ease your withdrawal from nicotine.   Reward yourself. Set aside the cigarette money you save and buy yourself something nice.   Look for support from others. Join a support group or smoking cessation program. Ask someone at home or at work to help you with your plan to quit smoking.   Always ask yourself, "Do I need this cigarette or is this just a reflex?" Tell yourself, "Today, I choose not to smoke," or "I do not want to smoke." You are reminding yourself of your decision to quit, even if you do smoke a cigarette.  HOW WILL I FEEL WHEN I QUIT SMOKING?  The benefits of not smoking start within days of quitting.   You may have symptoms of withdrawal because your body is used to nicotine (the addictive substance in cigarettes). You may crave cigarettes, be irritable, feel very hungry, cough often, get headaches, or have difficulty concentrating.   The withdrawal symptoms are only temporary. They are strongest when you first quit but will go away within 10 to 14 days.   When withdrawal symptoms occur, stay in control. Think about your reasons for quitting. Remind yourself that these are signs that your body is healing and getting used to being without cigarettes.   Remember that withdrawal symptoms are easier to treat than the major diseases that smoking can  cause.   Even after the withdrawal is over, expect periodic urges to smoke. However, these cravings are generally short-lived and will go away whether you smoke or not. Do not smoke!   If you relapse and smoke again, do not lose hope. Most smokers quit 3 times before they are successful.   If you relapse, do not give up! Plan ahead and think about what you will do the next time you get the urge to smoke.  LIFE AS A NONSMOKER: MAKE IT FOR A MONTH, MAKE IT FOR LIFE Day 1: Hang this page where you will see it every day. Day 2: Get rid of all ashtrays, matches, and lighters. Day 3: Drink water. Breathe deeply between sips. Day 4: Avoid places with   smoke-filled air, such as bars, clubs, or the smoking section of restaurants. Day 5: Keep track of how much money you save by not smoking. Day 6: Avoid boredom. Keep a good book with you or go to the movies. Day 7: Reward yourself! One week without smoking! Day 8: Make a dental appointment to get your teeth cleaned. Day 9: Decide how you will turn down a cigarette before it is offered to you. Day 10: Review your reasons for quitting. Day 11: Distract yourself. Stay active to keep your mind off smoking and to relieve tension. Take a walk, exercise, read a book, do a crossword puzzle, or try a new hobby. Day 12: Exercise. Get off the bus before your stop or use stairs instead of escalators. Day 13: Call on friends for support and encouragement. Day 14: Reward yourself! Two weeks without smoking! Day 15: Practice deep breathing exercises. Day 16: Bet a friend that you can stay a nonsmoker. Day 17: Ask to sit in nonsmoking sections of restaurants. Day 18: Hang up "No Smoking" signs. Day 19: Think of yourself as a nonsmoker. Day 20: Each morning, tell yourself you will not smoke. Day 21: Reward yourself! Three weeks without smoking! Day 22: Think of smoking in negative ways. Remember how it stains your teeth, gives you bad breath, and leaves you short  of breath. Day 23: Eat a nutritious breakfast. Day 24:Do not relive your days as a smoker. Day 25: Hold a pencil in your hand when talking on the telephone. Day 26: Tell all your friends you do not smoke. Day 27: Think about how much better food tastes. Day 28: Remember, one cigarette is one too many. Day 29: Take up a hobby that will keep your hands busy. Day 30: Congratulations! One month without smoking! Give yourself a big reward. Your caregiver can direct you to community resources or hospitals for support, which may include:  Group support.   Education.   Hypnosis.   Subliminal therapy.  Document Released: 10/13/2003 Document Revised: 09/26/2010 Document Reviewed: 10/31/2008 [employer] Patient Information 2012 [employer]. 

## 2011-02-18 NOTE — Progress Notes (Signed)
Subjective:     Patient ID: Sally Douglas, female   DOB: 1945-10-20, 66 y.o.   MRN: [id number]  HPI  UNDREA ARCHBOLD  1946/01/11 [id number]  Patient Care Team: Rogelia Boga, MD as PCP - General  This patient is a 66 y.o.female who presents today for surgical evaluation.   Diagnosis: Incarcerated supraumbilical ventral hernias  Procedure laparoscopic reduction and repair with mesh 01/24/2011  The patient comes in today tearful. She complains of pain. She is on Mobic. She uses a binder. She still smokes. She notes that the hydrocodone and she was switched to does not seem to work as well. She claims she cannot get up and down very well, having sharp pains. She wonders if the oxycodone worked better. Her husband is worried about her being on oxycodone as he thought it made her a little sleepy / groggy.  They think she had a rash to something in the hospital but cannot recall what (Dilaudid?).  She normally takes hydrocodone for her chronic back pain as it is. She tried her husband's tramadol without much help. She felt like it dried her mouth out.  She's been moving her bowels rarely. No fevers or chills. No nausea or vomiting. She is very anxious. She feels very stressed. She is surprised at how source she has. She feels like he has got a little worse this week. She's tried to do housework and chores but feels like she can't do it.  Patient Active Problem List  Diagnoses  . OBESITY  . TOBACCO ABUSE  . HYPERTENSION  . BACK PAIN, CHRONIC  . Incarcerated ventral hernia, upper abdomen  . Colonoscopy refused    Past Medical History  Diagnosis Date  . Hypertension   . Insomnia   . Obesity   . Menopausal syndrome   . Tobacco abuse   . Low back pain   . Bronchitis   . Impaired glucose tolerance   . Abnormal finding on Pap smear     hx abnl pap    Past Surgical History  Procedure Date  . Gyn surgery   . Tonsillectomy   . Dilation and curettage of uterus   . Ventral hernia  repair 01/24/2011    Procedure: LAPAROSCOPIC VENTRAL HERNIA;  Surgeon: Ardeth Sportsman, MD;  Location: [facility];  Service: General;  Laterality: N/A;  with Mesh    History   Social History  . Marital Status: Married    Spouse Name: N/A    Number of Children: N/A  . Years of Education: N/A   Occupational History  . Not on file.   Social History Main Topics  . Smoking status: Current Everyday Smoker -- 0.5 packs/day for 25 years    Types: Cigarettes  . Smokeless tobacco: Never Used  . Alcohol Use: 0.6 oz/week    1 Glasses of wine per week     occassional  . Drug Use: No  . Sexually Active: Not on file   Other Topics Concern  . Not on file   Social History Narrative   gavidia 1Para 1 aborta 0    Family History  Problem Relation Age of Onset  . Cancer Brother     renal ,also sister  . Diabetes Brother     and sister  . Breast cancer      sister  . Kidney disease    . Cancer Mother     Breast Cancer    Current outpatient prescriptions:acetaminophen (TYLENOL) 500 MG tablet, Take 500-1,000  mg by mouth every 6 (six) hours as needed. PAIN , Disp: , Rfl: ;  aspirin 81 MG tablet, Take 81 mg by mouth every morning. , Disp: , Rfl: ;  diltiazem (TIAZAC) 360 MG 24 hr capsule, Take 360 mg by mouth every morning. , Disp: , Rfl: ;  docusate sodium (COLACE) 100 MG capsule, Take 100 mg by mouth daily as needed. CONSTIPATION , Disp: , Rfl:  hydrochlorothiazide (HYDRODIURIL) 25 MG tablet, Take 25 mg by mouth every morning. , Disp: , Rfl: ;  meloxicam (MOBIC) 15 MG tablet, Take 1 tablet (15 mg total) by mouth daily., Disp: 90 tablet, Rfl: 6;  tetrahydrozoline 0.05 % ophthalmic solution, Place 2 drops into both eyes daily. , Disp: , Rfl: ;  zolpidem (AMBIEN) 10 MG tablet, Take 10 mg by mouth at bedtime as needed. SLEEP , Disp: , Rfl:  amitriptyline (ELAVIL) 25 MG tablet, Take 2 tablets (50 mg total) by mouth at bedtime. Use to lower nerve pain.  Start with 1/2 pill  & increase the dose gradually  over a few weeks to 1-2 pills in order to control pain but not get too sleepy, Disp: 50 tablet, Rfl: 2;  HYDROcodone-acetaminophen (NORCO) 10-325 MG per tablet, Take 1-2 tablets by mouth every 4 (four) hours as needed for pain., Disp: 60 tablet, Rfl: 1  No Known Allergies  BP 136/90  Pulse 84  Temp(Src) 98 F (36.7 C) (Temporal)  Resp 18  Ht 4\' 8"  (1.422 m)  Wt 181 lb 3.2 oz (82.192 kg)  BMI 40.62 kg/m2     Review of Systems  Constitutional: Negative for fever, chills and diaphoresis.  HENT: Negative for ear pain, sore throat and trouble swallowing.   Eyes: Negative for photophobia and visual disturbance.  Respiratory: Negative for cough and choking.   Cardiovascular: Negative for chest pain and palpitations.  Gastrointestinal: Positive for abdominal pain. Negative for nausea, vomiting, diarrhea, constipation, blood in stool, abdominal distention, anal bleeding and rectal pain.  Genitourinary: Negative for dysuria, frequency and difficulty urinating.  Musculoskeletal: Negative for myalgias and gait problem.  Skin: Negative for color change, pallor and rash.  Neurological: Negative for dizziness, speech difficulty, weakness and numbness.  Hematological: Negative for adenopathy.  Psychiatric/Behavioral: Positive for sleep disturbance and dysphoric mood. Negative for confusion and agitation. The patient is nervous/anxious.        Objective:   Physical Exam  Constitutional: She is oriented to person, place, and time. She appears well-developed and well-nourished. No distress.  HENT:  Head: Normocephalic.  Mouth/Throat: Oropharynx is clear and moist. No oropharyngeal exudate.  Eyes: Conjunctivae and EOM are normal. Pupils are equal, round, and reactive to light. No scleral icterus.  Neck: Normal range of motion. No tracheal deviation present.  Cardiovascular: Normal rate and intact distal pulses.   Pulmonary/Chest: Effort normal. No respiratory distress. She exhibits no  tenderness.  Abdominal: Soft. She exhibits no distension. There is no tenderness. Hernia confirmed negative in the right inguinal area and confirmed negative in the left inguinal area.       Morbidly obese w panniculus  Incisions clean with normal healing ridges.  No hernias.  Supraumb seroma at old hernia site.  Most sensitive LLQ  Genitourinary: No vaginal discharge found.  Musculoskeletal: Normal range of motion. She exhibits no tenderness.  Lymphadenopathy:       Right: No inguinal adenopathy present.       Left: No inguinal adenopathy present.  Neurological: She is alert and oriented to person, place, and  time. No cranial nerve deficit. She exhibits normal muscle tone. Coordination normal.  Skin: Skin is warm and dry. No rash noted. She is not diaphoretic.  Psychiatric: She has a normal mood and affect. Her behavior is normal.       Assessment:     3 weeks s/p lap VWH repair, pain inadequately controlled    Plan:     I spent a good half hour talking to her. I was trying to let her vent on her frustrations. I noted with her morbid obesity, continued tobacco use, and chronic narcotic dependence; it did not surprise me that she was still having pain issues a few weeks out. I cautioned her would take several months. But I wanted to give her hope that it would get better. She initially felt like this was how she was going to be the rest of her life. Her has not eventually convinced her to try and be hopeful. We will try and control her pain better. I stressed to her to please let we know if it's not working so we can try and improve things sooner.  I strongly recommend she stopped smoking as she told me she was "do anything down this pain go away."  She didn't think she would be able to stop, though.  Increase activity as tolerated.  Back off for now.  Do not push through pain.  Continue Binder/Mobic/Heat.  Add Elavil QHS.  Inc hydrocodone to 10/325.   Advanced on diet as tolerated.  Bowel regimen to avoid problems.  Return to clinic ~ 2weeks.  Call if not working & we will retry oxycodone. The patient expressed understanding and appreciation

## 2011-02-20 ENCOUNTER — Encounter (INDEPENDENT_AMBULATORY_CARE_PROVIDER_SITE_OTHER): Payer: Medicare Other | Admitting: Surgery

## 2011-03-06 ENCOUNTER — Ambulatory Visit (INDEPENDENT_AMBULATORY_CARE_PROVIDER_SITE_OTHER): Payer: Medicare Other | Admitting: Surgery

## 2011-03-06 ENCOUNTER — Encounter (INDEPENDENT_AMBULATORY_CARE_PROVIDER_SITE_OTHER): Payer: Self-pay | Admitting: Surgery

## 2011-03-06 VITALS — BP 148/84 | HR 70 | Temp 97.8°F | Resp 16 | Ht <= 58 in | Wt 183.2 lb

## 2011-03-06 DIAGNOSIS — M549 Dorsalgia, unspecified: Secondary | ICD-10-CM

## 2011-03-06 DIAGNOSIS — K436 Other and unspecified ventral hernia with obstruction, without gangrene: Secondary | ICD-10-CM

## 2011-03-06 MED ORDER — HYDROCODONE-ACETAMINOPHEN 10-325 MG PO TABS: 1.0000 | ORAL_TABLET | Freq: Four times a day (QID) | ORAL | Status: DC | PRN

## 2011-03-06 NOTE — Patient Instructions (Signed)

## 2011-03-06 NOTE — Progress Notes (Signed)
Subjective:     Patient ID: Sally Douglas, female   DOB: Mar 31, 1945, 66 y.o.   MRN: [id number]  HPI   Sally Douglas  April 16, 1945 [id number]  Patient Care Team: Rogelia Boga, MD as PCP - General  This patient is a 66 y.o.female who presents today for surgical evaluation.   Diagnosis: Incarcerated supraumbilical ventral hernias  Procedure laparoscopic reduction and repair with mesh 01/24/2011  The patient comes in today better.  Still uses a binder. She still smokes. She notes that the hydrocodone 10/325 worked better & is weaning down to 1 pill q4h (was 2).  Wanting to get in bathtub.  She's been moving her bowels better. No fevers or chills. No nausea or vomiting. She's doing some housework and chores.  Patient Active Problem List  Diagnoses  . Obesity, Class III, BMI 40-49.9 (morbid obesity)  . TOBACCO ABUSE  . HYPERTENSION  . BACK PAIN, CHRONIC  . Incarcerated ventral hernia, upper abdomen  . Colonoscopy refused    Past Medical History  Diagnosis Date  . Hypertension   . Insomnia   . Obesity   . Menopausal syndrome   . Tobacco abuse   . Low back pain   . Bronchitis   . Impaired glucose tolerance   . Abnormal finding on Pap smear     hx abnl pap  . Ventral hernia     Past Surgical History  Procedure Date  . Gyn surgery   . Tonsillectomy   . Dilation and curettage of uterus   . Ventral hernia repair 01/24/2011    Procedure: LAPAROSCOPIC VENTRAL HERNIA;  Surgeon: Ardeth Sportsman, MD;  Location: [facility];  Service: General;  Laterality: N/A;  with Mesh    History   Social History  . Marital Status: Married    Spouse Name: N/A    Number of Children: N/A  . Years of Education: N/A   Occupational History  . Not on file.   Social History Main Topics  . Smoking status: Current Everyday Smoker -- 0.5 packs/day for 25 years    Types: Cigarettes  . Smokeless tobacco: Never Used  . Alcohol Use: 0.6 oz/week    1 Glasses of wine per week     occassional   . Drug Use: No  . Sexually Active: Not on file   Other Topics Concern  . Not on file   Social History Narrative   gavidia 1Para 1 aborta 0    Family History  Problem Relation Age of Onset  . Cancer Brother     renal ,also sister  . Diabetes Brother     and sister  . Breast cancer      sister  . Kidney disease    . Cancer Mother     Breast Cancer    Current outpatient prescriptions:acetaminophen (TYLENOL) 500 MG tablet, Take 500-1,000 mg by mouth every 6 (six) hours as needed. PAIN , Disp: , Rfl: ;  amitriptyline (ELAVIL) 25 MG tablet, Take 2 tablets (50 mg total) by mouth at bedtime. Use to lower nerve pain.  Start with 1/2 pill  & increase the dose gradually over a few weeks to 1-2 pills in order to control pain but not get too sleepy, Disp: 50 tablet, Rfl: 2 aspirin 81 MG tablet, Take 81 mg by mouth every morning. , Disp: , Rfl: ;  diltiazem (TIAZAC) 360 MG 24 hr capsule, Take 360 mg by mouth every morning. , Disp: , Rfl: ;  docusate sodium (COLACE)  100 MG capsule, Take 100 mg by mouth daily as needed. CONSTIPATION , Disp: , Rfl: ;  hydrochlorothiazide (HYDRODIURIL) 25 MG tablet, Take 25 mg by mouth every morning. , Disp: , Rfl:  HYDROcodone-acetaminophen (NORCO) 10-325 MG per tablet, Take 1-2 tablets by mouth every 6 (six) hours as needed for pain., Disp: 60 tablet, Rfl: 1;  meloxicam (MOBIC) 15 MG tablet, Take 1 tablet (15 mg total) by mouth daily., Disp: 90 tablet, Rfl: 6;  tetrahydrozoline 0.05 % ophthalmic solution, Place 2 drops into both eyes daily. , Disp: , Rfl: ;  zolpidem (AMBIEN) 10 MG tablet, Take 10 mg by mouth at bedtime as needed. SLEEP , Disp: , Rfl:   No Known Allergies  BP 148/84  Pulse 70  Temp(Src) 97.8 F (36.6 C) (Temporal)  Resp 16  Ht 4\' 8"  (1.422 m)  Wt 183 lb 3.2 oz (83.099 kg)  BMI 41.07 kg/m2     Review of Systems  Constitutional: Negative for fever, chills and diaphoresis.  HENT: Negative for ear pain, sore throat and trouble swallowing.     Eyes: Negative for photophobia and visual disturbance.  Respiratory: Negative for cough and choking.   Cardiovascular: Negative for chest pain and palpitations.  Gastrointestinal: Positive for abdominal pain. Negative for nausea, vomiting, diarrhea, constipation, blood in stool, abdominal distention, anal bleeding and rectal pain.  Genitourinary: Negative for dysuria, frequency and difficulty urinating.  Musculoskeletal: Negative for myalgias and gait problem.  Skin: Negative for color change, pallor and rash.  Neurological: Negative for dizziness, speech difficulty, weakness and numbness.  Hematological: Negative for adenopathy.  Psychiatric/Behavioral: Negative for confusion and agitation.       Objective:   Physical Exam  Constitutional: She is oriented to person, place, and time. She appears well-developed and well-nourished. No distress.  HENT:  Head: Normocephalic.  Mouth/Throat: Oropharynx is clear and moist. No oropharyngeal exudate.  Eyes: Conjunctivae and EOM are normal. Pupils are equal, round, and reactive to light. No scleral icterus.  Neck: Normal range of motion. No tracheal deviation present.  Cardiovascular: Normal rate and intact distal pulses.   Pulmonary/Chest: Effort normal. No respiratory distress. She exhibits no tenderness.  Abdominal: Soft. She exhibits no distension. There is no tenderness. Hernia confirmed negative in the right inguinal area and confirmed negative in the left inguinal area.       Morbidly obese w panniculus  Incisions clean with normal healing ridges.  No hernias.  Supraumb seroma at old hernia site.  Most sensitive RUQ>LLQ, but much milder  Genitourinary: No vaginal discharge found.  Musculoskeletal: Normal range of motion. She exhibits no tenderness.  Lymphadenopathy:       Right: No inguinal adenopathy present.       Left: No inguinal adenopathy present.  Neurological: She is alert and oriented to person, place, and time. No cranial  nerve deficit. She exhibits normal muscle tone. Coordination normal.  Skin: Skin is warm and dry. No rash noted. She is not diaphoretic.  Psychiatric: She has a normal mood and affect. Her speech is normal and behavior is normal. Her mood appears not anxious. She does not exhibit a depressed mood.       Assessment:     5 weeks s/p lap VWH repair, pain better controlled    Plan:     I am glad she is making improvements. Given her smoking and obesity and chronic pain issues, it does not surprise me that she is still having some issues. But the trend is in the  right direction. She feels reassured. Her husband is as well.  I strongly recommend she stopped smoking as she told me she was "do anything down this pain go away."  She didn't think she would be able to stop, though.  Increase activity as tolerated.  Do not push through pain.  Continue Binder/Mobic/Heat/Elavil QHS.   Refilled hydrocodone to 10/325.   Advanced on diet as tolerated. Bowel regimen to avoid problems.  The patient expressed understanding and appreciation

## 2011-04-03 ENCOUNTER — Other Ambulatory Visit: Payer: Self-pay | Admitting: Internal Medicine

## 2011-04-04 NOTE — Telephone Encounter (Signed)
Last seen 11/09/10/  6 mos rov  Last written 11/09/10 # 90 3RF She was getting norco from dr. Michaell Cowing following hernia surgery - that was last written 02/09/11 # 30   0RF   I dont see where any other pain meds have been dispensed.  Please advise

## 2011-04-04 NOTE — Telephone Encounter (Signed)
DONE

## 2011-04-04 NOTE — Telephone Encounter (Signed)
30

## 2011-04-05 ENCOUNTER — Telehealth (INDEPENDENT_AMBULATORY_CARE_PROVIDER_SITE_OTHER): Payer: Self-pay | Admitting: Surgery

## 2011-04-05 NOTE — Telephone Encounter (Signed)
Called pt back to see what she needed and the pt is still having pain after incisional hernia repair by Dr Michaell Cowing. The pt wasn't sure if she should come to the appt with DrGross on 04-09-11 and I advised if pt still has questions she needs to keep the appt. The pt does want to ask a few questions about certain area's of pain.

## 2011-04-09 ENCOUNTER — Ambulatory Visit (INDEPENDENT_AMBULATORY_CARE_PROVIDER_SITE_OTHER): Payer: Medicare Other | Admitting: Surgery

## 2011-04-09 ENCOUNTER — Encounter (INDEPENDENT_AMBULATORY_CARE_PROVIDER_SITE_OTHER): Payer: Self-pay | Admitting: Surgery

## 2011-04-09 DIAGNOSIS — F17200 Nicotine dependence, unspecified, uncomplicated: Secondary | ICD-10-CM

## 2011-04-09 DIAGNOSIS — K436 Other and unspecified ventral hernia with obstruction, without gangrene: Secondary | ICD-10-CM

## 2011-04-09 MED ORDER — HYDROCODONE-ACETAMINOPHEN 10-325 MG PO TABS: 1.0000 | ORAL_TABLET | Freq: Four times a day (QID) | ORAL | Status: AC | PRN

## 2011-04-09 NOTE — Progress Notes (Signed)
Subjective:     Patient ID: Sally Douglas, female   DOB: 05/20/1945, 66 y.o.   MRN: [id number]  HPI   Sally Douglas  1945/05/13 [id number]  Patient Care Team: Gordy Savers, MD as PCP - General  This patient is a 66 y.o.female who presents today for surgical evaluation.   Diagnosis: Incarcerated supraumbilical ventral hernias  Procedure laparoscopic reduction and repair with mesh 01/24/2011  The patient comes in today better.  She stopped using the binder. She still smokes. She notes that the hydrocodone 10/325 worked better & is not getting as good of pain control with the 5/500s.    No fevers or chills. No nausea or vomiting. She's doing some yardwork / gardening  Patient Active Problem List  Diagnoses  . Obesity, Class III, BMI 40-49.9 (morbid obesity)  . TOBACCO ABUSE  . HYPERTENSION  . BACK PAIN, CHRONIC  . Incarcerated ventral hernia, upper abdomen  . Colonoscopy refused    Past Medical History  Diagnosis Date  . Hypertension   . Insomnia   . Obesity   . Menopausal syndrome   . Tobacco abuse   . Low back pain   . Bronchitis   . Impaired glucose tolerance   . Abnormal finding on Pap smear     hx abnl pap  . Ventral hernia     Past Surgical History  Procedure Date  . Gyn surgery   . Tonsillectomy   . Dilation and curettage of uterus   . Ventral hernia repair 01/24/2011    Procedure: LAPAROSCOPIC VENTRAL HERNIA;  Surgeon: Ardeth Sportsman, MD;  Location: [facility];  Service: General;  Laterality: N/A;  with Mesh    History   Social History  . Marital Status: Married    Spouse Name: N/A    Number of Children: N/A  . Years of Education: N/A   Occupational History  . Not on file.   Social History Main Topics  . Smoking status: Current Everyday Smoker -- 0.5 packs/day for 25 years    Types: Cigarettes  . Smokeless tobacco: Never Used  . Alcohol Use: No     occassional  . Drug Use: No  . Sexually Active: Not on file   Other Topics Concern  .  Not on file   Social History Narrative   gavidia 1Para 1 aborta 0    Family History  Problem Relation Age of Onset  . Cancer Brother     renal ,also sister  . Diabetes Brother     and sister  . Breast cancer      sister  . Kidney disease    . Cancer Mother     Breast Cancer    Current outpatient prescriptions:acetaminophen (TYLENOL) 500 MG tablet, Take 500-1,000 mg by mouth every 6 (six) hours as needed. PAIN , Disp: , Rfl: ;  amitriptyline (ELAVIL) 25 MG tablet, Take 2 tablets (50 mg total) by mouth at bedtime. Use to lower nerve pain.  Start with 1/2 pill  & increase the dose gradually over a few weeks to 1-2 pills in order to control pain but not get too sleepy, Disp: 50 tablet, Rfl: 2 aspirin 81 MG tablet, Take 81 mg by mouth every morning. , Disp: , Rfl: ;  diltiazem (TIAZAC) 360 MG 24 hr capsule, Take 360 mg by mouth every morning. , Disp: , Rfl: ;  docusate sodium (COLACE) 100 MG capsule, Take 100 mg by mouth daily as needed. CONSTIPATION , Disp: , Rfl: ;  hydrochlorothiazide (HYDRODIURIL) 25 MG tablet, Take 25 mg by mouth every morning. , Disp: , Rfl:  meloxicam (MOBIC) 15 MG tablet, Take 1 tablet (15 mg total) by mouth daily., Disp: 90 tablet, Rfl: 6;  tetrahydrozoline 0.05 % ophthalmic solution, Place 2 drops into both eyes daily. , Disp: , Rfl: ;  zolpidem (AMBIEN) 10 MG tablet, Take 10 mg by mouth at bedtime as needed. SLEEP , Disp: , Rfl: ;  HYDROcodone-acetaminophen (NORCO) 10-325 MG per tablet, Take 1-2 tablets by mouth every 6 (six) hours as needed for pain., Disp: 60 tablet, Rfl: 1  No Known Allergies  BP 130/86  Pulse 80  Temp(Src) 98 F (36.7 C) (Temporal)  Resp 18  Ht 4\' 10"  (1.473 m)  Wt 181 lb (82.101 kg)  BMI 37.83 kg/m2     Review of Systems  Constitutional: Negative for fever, chills and diaphoresis.  HENT: Negative for ear pain, sore throat and trouble swallowing.   Eyes: Negative for photophobia and visual disturbance.  Respiratory: Negative for  cough and choking.   Cardiovascular: Negative for chest pain and palpitations.  Gastrointestinal: Positive for abdominal pain. Negative for nausea, vomiting, diarrhea, constipation, blood in stool, abdominal distention, anal bleeding and rectal pain.  Genitourinary: Negative for dysuria, frequency and difficulty urinating.  Musculoskeletal: Negative for myalgias and gait problem.  Skin: Negative for color change, pallor and rash.  Neurological: Negative for dizziness, speech difficulty, weakness and numbness.  Hematological: Negative for adenopathy.  Psychiatric/Behavioral: Negative for confusion and agitation.       Objective:   Physical Exam  Constitutional: She is oriented to person, place, and time. She appears well-developed and well-nourished. No distress.  HENT:  Head: Normocephalic.  Mouth/Throat: Oropharynx is clear and moist. No oropharyngeal exudate.  Eyes: Conjunctivae and EOM are normal. Pupils are equal, round, and reactive to light. No scleral icterus.  Neck: Normal range of motion. No tracheal deviation present.  Cardiovascular: Normal rate and intact distal pulses.   Pulmonary/Chest: Effort normal. No respiratory distress. She exhibits no tenderness.  Abdominal: Soft. She exhibits no distension. There is no tenderness. Hernia confirmed negative in the right inguinal area and confirmed negative in the left inguinal area.       Morbidly obese w panniculus  Incisions clean with normal healing ridges.  No hernias.  No seroma at old hernia site.  Mildly diffuse sensitivity - milder  Genitourinary: No vaginal discharge found.  Musculoskeletal: Normal range of motion. She exhibits no tenderness.  Lymphadenopathy:       Right: No inguinal adenopathy present.       Left: No inguinal adenopathy present.  Neurological: She is alert and oriented to person, place, and time. No cranial nerve deficit. She exhibits normal muscle tone. Coordination normal.  Skin: Skin is warm and dry.  No rash noted. She is not diaphoretic.  Psychiatric: She has a normal mood and affect. Her speech is normal and behavior is normal. Her mood appears not anxious. She does not exhibit a depressed mood.       Assessment:     2.5 months s/p lap VWH repair, pain better controlled    Plan:     I am glad she is making improvements. Given her smoking and obesity and chronic pain issues, it does not surprise me that she is still having some issues. The trend is in the right direction. She feels reassured.   I strongly recommend she stopped smoking as she told me she was "do anything down this  pain go away."  She didn't think she would be able to stop, though.  Increase activity as tolerated.  Do not push through pain.  Continue Mobic/Heatl QHS.   Refilled hydrocodone to 10/325 one last time  RTC PRN The patient expressed understanding and appreciation

## 2011-04-09 NOTE — Patient Instructions (Signed)
Smoking Cessation, Tips for Success     YOU CAN QUIT SMOKING   If you are ready to quit smoking, congratulations! You have chosen to help yourself be healthier. Cigarettes bring nicotine, tar, carbon monoxide, and other irritants into your body. Your lungs, heart, and blood vessels will be able to work better without these poisons. There are many different ways to quit smoking. Nicotine gum, nicotine patches, a nicotine inhaler, or nicotine nasal spray can help with physical craving. Hypnosis, support groups, and medicines help break the habit of smoking. Here are some tips to help you quit for good.     . Throw away all cigarettes.   . Clean and remove all ashtrays from your home, work, and car.   . On a card, write down your reasons for quitting. Carry the card with you and read it when you get the urge to smoke.   . Cleanse your body of nicotine. Drink enough water and fluids to keep your urine clear or pale yellow. Do this after quitting to flush the nicotine from your body.   . Learn to predict your moods. Do not let a bad situation be your excuse to have a cigarette. Some situations in your life might tempt you into wanting a cigarette.   . Never have "just one" cigarette. It leads to wanting another and another. Remind yourself of your decision to quit.   . Change habits associated with smoking. If you smoked while driving or when feeling stressed, try other activities to replace smoking. Stand up when drinking your coffee. Brush your teeth after eating. Sit in a different chair when you read the paper. Avoid alcohol while trying to quit, and try to drink fewer caffeinated beverages. Alcohol and caffeine may urge you to smoke.   . Avoid foods and drinks that can trigger a desire to smoke, such as sugary or spicy foods and alcohol.   . Ask people who smoke not to smoke around you.   . Have something planned to do right after eating or having a cup of coffee. Take a walk or exercise to perk you up. This will  help to keep you from overeating.   . Try a relaxation exercise to calm you down and decrease your stress. Remember, you may be tense and nervous for the first 2 weeks after you quit, but this will pass.   . Find new activities to keep your hands busy. Play with a pen, coin, or rubber band. Doodle or draw things on paper.   . Brush your teeth right after eating. This will help cut down on the craving for the taste of tobacco after meals. You can try mouthwash, too.   . Use oral substitutes, such as lemon drops, carrots, a cinnamon stick, or chewing gum, in place of cigarettes. Keep them handy so they are available when you have the urge to smoke.   . When you have the urge to smoke, try deep breathing.   . Designate your home as a nonsmoking area.   . If you are a heavy smoker, ask your caregiver about a prescription for nicotine chewing gum. It can ease your withdrawal from nicotine.   . Reward yourself. Set aside the cigarette money you save and buy yourself something nice.   . Look for support from others. Join a support group or smoking cessation program. Ask someone at home or at work to help you with your plan to quit smoking.   . Always ask   yourself, "Do I need this cigarette or is this just a reflex?" Tell yourself, "Today, I choose not to smoke," or "I do not want to smoke." You are reminding yourself of your decision to quit, even if you do smoke a cigarette.    HOW WILL I FEEL WHEN I QUIT SMOKING?     . The benefits of not smoking start within days of quitting.   . You may have symptoms of withdrawal because your body is used to nicotine (the addictive substance in cigarettes). You may crave cigarettes, be irritable, feel very hungry, cough often, get headaches, or have difficulty concentrating.   . The withdrawal symptoms are only temporary. They are strongest when you first quit but will go away within 10 to 14 days.   . When withdrawal symptoms occur, stay in control. Think about your reasons for  quitting. Remind yourself that these are signs that your body is healing and getting used to being without cigarettes.   . Remember that withdrawal symptoms are easier to treat than the major diseases that smoking can cause.   . Even after the withdrawal is over, expect periodic urges to smoke. However, these cravings are generally short-lived and will go away whether you smoke or not. Do not smoke!   . If you relapse and smoke again, do not lose hope. Most smokers quit 3 times before they are successful.   . If you relapse, do not give up! Plan ahead and think about what you will do the next time you get the urge to smoke.    LIFE AS A NONSMOKER: MAKE IT FOR A MONTH, MAKE IT FOR LIFE     Day 1: Hang this page where you will see it every day.   Day 2: Get rid of all ashtrays, matches, and lighters.   Day 3: Drink water. Breathe deeply between sips.   Day 4: Avoid places with smoke-filled air, such as bars, clubs, or the smoking section of restaurants.   Day 5: Keep track of how much money you save by not smoking.   Day 6: Avoid boredom. Keep a good book with you or go to the movies.   Day 7: Reward yourself! One week without smoking!   Day 8: Make a dental appointment to get your teeth cleaned.   Day 9: Decide how you will turn down a cigarette before it is offered to you.   Day 10: Review your reasons for quitting.   Day 11: Distract yourself. Stay active to keep your mind off smoking and to relieve tension. Take a walk, exercise, read a book, do a crossword puzzle, or try a new hobby.   Day 12: Exercise. Get off the bus before your stop or use stairs instead of escalators.   Day 13: Call on friends for support and encouragement.   Day 14: Reward yourself! Two weeks without smoking!   Day 15: Practice deep breathing exercises.   Day 16: Bet a friend that you can stay a nonsmoker.   Day 17: Ask to sit in nonsmoking sections of restaurants.   Day 18: Hang up "No Smoking" signs.   Day 19: Think of yourself as a  nonsmoker.   Day 20: Each morning, tell yourself you will not smoke.   Day 21: Reward yourself! Three weeks without smoking!   Day 22: Think of smoking in negative ways. Remember how it stains your teeth, gives you bad breath, and leaves you short of breath.   Day   23: Eat a nutritious breakfast.   Day 24:Do not relive your days as a smoker.   Day 25: Hold a pencil in your hand when talking on the telephone.   Day 26: Tell all your friends you do not smoke.   Day 27: Think about how much better food tastes.   Day 28: Remember, one cigarette is one too many.   Day 29: Take up a hobby that will keep your hands busy.   Day 30: Congratulations! One month without smoking! Give yourself a big reward.     Your caregiver can direct you to community resources or hospitals for support, which may include:   . Group support.   . Education.   . Hypnosis.   . Subliminal therapy.      Document Released: 10/13/2003 Document Revised: 01/03/2011 Document Reviewed: 10/31/2008   [employer] Patient Information 2012 [employer]

## 2011-05-16 ENCOUNTER — Telehealth: Payer: Self-pay | Admitting: Internal Medicine

## 2011-05-16 ENCOUNTER — Encounter: Payer: Self-pay | Admitting: Internal Medicine

## 2011-05-16 ENCOUNTER — Ambulatory Visit (INDEPENDENT_AMBULATORY_CARE_PROVIDER_SITE_OTHER): Payer: Medicare Other | Admitting: Internal Medicine

## 2011-05-16 ENCOUNTER — Other Ambulatory Visit: Payer: Self-pay | Admitting: Internal Medicine

## 2011-05-16 VITALS — BP 120/80 | HR 86 | Temp 98.1°F | Resp 20 | Ht <= 58 in | Wt 184.0 lb

## 2011-05-16 DIAGNOSIS — I10 Essential (primary) hypertension: Secondary | ICD-10-CM | POA: Diagnosis not present

## 2011-05-16 DIAGNOSIS — Z23 Encounter for immunization: Secondary | ICD-10-CM | POA: Diagnosis not present

## 2011-05-16 DIAGNOSIS — Z0000 Encounter for general adult medical examination without abnormal findings: Secondary | ICD-10-CM

## 2011-05-16 DIAGNOSIS — Z299 Encounter for prophylactic measures, unspecified: Secondary | ICD-10-CM

## 2011-05-16 DIAGNOSIS — F17200 Nicotine dependence, unspecified, uncomplicated: Secondary | ICD-10-CM | POA: Diagnosis not present

## 2011-05-16 DIAGNOSIS — E785 Hyperlipidemia, unspecified: Secondary | ICD-10-CM

## 2011-05-16 DIAGNOSIS — M549 Dorsalgia, unspecified: Secondary | ICD-10-CM

## 2011-05-16 DIAGNOSIS — E66813 Obesity, class 3: Secondary | ICD-10-CM

## 2011-05-16 LAB — LIPID PANEL
HDL: 52.8 mg/dL (ref >39.00)
Total CHOL/HDL Ratio: 3
VLDL: 32.2 mg/dL (ref 0.0–40.0)

## 2011-05-16 LAB — TSH: TSH: 0.51 u[IU]/mL (ref 0.35–5.50)

## 2011-05-16 MED ORDER — MELOXICAM 15 MG PO TABS: 15.0000 mg | ORAL_TABLET | Freq: Every day | ORAL | Status: DC

## 2011-05-16 MED ORDER — DILTIAZEM HCL ER BEADS 360 MG PO CP24: 360.0000 mg | ORAL_CAPSULE | ORAL | Status: DC

## 2011-05-16 MED ORDER — AMITRIPTYLINE HCL 25 MG PO TABS: 50.0000 mg | ORAL_TABLET | Freq: Every day | ORAL | Status: DC

## 2011-05-16 MED ORDER — ZOLPIDEM TARTRATE 10 MG PO TABS: 10.0000 mg | ORAL_TABLET | Freq: Every evening | ORAL | Status: DC | PRN

## 2011-05-16 MED ORDER — HYDROCHLOROTHIAZIDE 25 MG PO TABS: 25.0000 mg | ORAL_TABLET | ORAL | Status: DC

## 2011-05-16 MED ORDER — HYDROCODONE-ACETAMINOPHEN 10-325 MG PO TABS: 1.0000 | ORAL_TABLET | Freq: Three times a day (TID) | ORAL | Status: AC | PRN

## 2011-05-16 NOTE — Telephone Encounter (Signed)
Attempt to call both Numbers - LMTCB so I can ans other questions - RF on meds were sent to [facility] - # [phone] at the time of visit. They should have on file. They may have used rx's already on file.

## 2011-05-16 NOTE — Telephone Encounter (Signed)
Change to med done and resent Spoke with pt- discuss and answered questions r/t med and labs, and dx. From appt today

## 2011-05-16 NOTE — Telephone Encounter (Signed)
Pt returned call. Pls call back at [phone] at earliest convenience.

## 2011-05-16 NOTE — Telephone Encounter (Signed)
Please advise on elavil rf - pt is requesting #[phone] - 90 day rx

## 2011-05-16 NOTE — Patient Instructions (Signed)
It is important that you exercise regularly, at least 20 minutes 3 to 4 times per week.  If you develop chest pain or shortness of breath seek  medical attention.  Smoking tobacco is very bad for your health. You should stop smoking immediately.  Schedule your colonoscopy to help detect colon cancer.  Schedule your mammogram.  You need to lose weight.  Consider a lower calorie diet and regular exercise.  Return in 6 months for follow-up Back Pain, Adult Low back pain is very common. About 1 in 5 people have back pain. The cause of low back pain is rarely dangerous. The pain often gets better over time. About half of people with a sudden onset of back pain feel better in just 2 weeks. About 8 in 10 people feel better by 6 weeks.   CAUSES Some common causes of back pain include:  Strain of the muscles or ligaments supporting the spine.   Wear and tear (degeneration) of the spinal discs.   Arthritis.   Direct injury to the back.  DIAGNOSIS Most of the time, the direct cause of low back pain is not known. However, back pain can be treated effectively even when the exact cause of the pain is unknown. Answering your caregiver's questions about your overall health and symptoms is one of the most accurate ways to make sure the cause of your pain is not dangerous. If your caregiver needs more information, he or she may order lab work or imaging tests (X-rays or MRIs). However, even if imaging tests show changes in your back, this usually does not require surgery. HOME CARE INSTRUCTIONS For many people, back pain returns. Since low back pain is rarely dangerous, it is often a condition that people can learn to manage on their own.    Remain active. It is stressful on the back to sit or stand in one place. Do not sit, drive, or stand in one place for more than 30 minutes at a time. Take short walks on level surfaces as soon as pain allows. Try to increase the length of time you walk each day.    Do not stay in bed. Resting more than 1 or 2 days can delay your recovery.   Do not avoid exercise or work. Your body is made to move. It is not dangerous to be active, even though your back may hurt. Your back will likely heal faster if you return to being active before your pain is gone.   Pay attention to your body when you  bend and lift. Many people have less discomfort when lifting if they bend their knees, keep the load close to their bodies, and avoid twisting. Often, the most comfortable positions are those that put less stress on your recovering back.   Find a comfortable position to sleep. Use a firm mattress and lie on your side with your knees slightly bent. If you lie on your back, put a pillow under your knees.   Only take over-the-counter or prescription medicines as directed by your caregiver. Over-the-counter medicines to reduce pain and inflammation are often the most helpful. Your caregiver may prescribe muscle relaxant drugs. These medicines help dull your pain so you can more quickly return to your normal activities and healthy exercise.   Put ice on the injured area.   Put ice in a plastic bag.   Place a towel between your skin and the bag.   Leave the ice on for 15 to 20 minutes, 3  to 4 times a day for the first 2 to 3 days. After that, ice and heat may be alternated to reduce pain and spasms.   Ask your caregiver about trying back exercises and gentle massage. This may be of some benefit.   Avoid feeling anxious or stressed. Stress increases muscle tension and can worsen back pain. It is important to recognize when you are anxious or stressed and learn ways to manage it. Exercise is a great option.  SEEK MEDICAL CARE IF:  You have pain that is not relieved with rest or medicine.   You have pain that does not improve in 1 week.   You have new symptoms.   You are generally not feeling well.  SEEK IMMEDIATE MEDICAL CARE IF:    You have pain that radiates from  your back into your legs.   You develop new bowel or bladder control problems.   You have unusual weakness or numbness in your arms or legs.   You develop nausea or vomiting.   You develop abdominal pain.   You feel faint.  Document Released: 01/14/2005 Document Revised: 01/03/2011 Document Reviewed: 06/04/2010 [facility] Patient Information 2012 [place], [place].Back Exercises Back exercises help treat and prevent back injuries. The goal is to increase your strength in your belly (abdominal) and back muscles. These exercises can also help with flexibility. Start these exercises when told by your doctor. HOME CARE Back exercises include: Pelvic Tilt.  Lie on your back with your knees bent. Tilt your pelvis until the lower part of your back is against the floor. Hold this position 5 to 10 sec. Repeat this exercise 5 to 10 times.  Knee to Chest.  Pull 1 knee up against your chest and hold for 20 to 30 seconds. Repeat this with the other knee. This may be done with the other leg straight or bent, whichever feels better. Then, pull both knees up against your chest.  Sit-Ups or Curl-Ups.  Bend your knees 90 degrees. Start with tilting your pelvis, and do a partial, slow sit-up. Only lift your upper half 30 to 45 degrees off the floor. Take at least 2 to 3 seonds for each sit-up. Do not do sit-ups with your knees out straight. If partial sit-ups are difficult, simply do the above but with only tightening your belly (abdominal) muscles and holding it as told.  Hip-Lift.  Lie on your back with your knees flexed 90 degrees. Push down with your feet and shoulders as you raise your hips 2 inches off the floor. Hold for 10 seconds, repeat 5 to 10 times.  Back Arches.  Lie on your stomach. Prop yourself up on bent elbows. Slowly press on your hands, causing an arch in your low back. Repeat 3 to 5 times.  Shoulder-Lifts.  Lie face down with arms beside your body. Keep hips and belly pressed to floor  as you slowly lift your head and shoulders off the floor.  Do not overdo your exercises. Be careful in the beginning. Exercises may cause you some mild back discomfort. If the pain lasts for more than 15 minutes, stop the exercises until you see your doctor. Improvement with exercise for back problems is slow.   Document Released: 02/16/2010 Document Revised: 01/03/2011 Document Reviewed: 02/16/2010 [facility] Patient Information 2012 [place], [place].

## 2011-05-16 NOTE — Telephone Encounter (Signed)
ok 

## 2011-05-16 NOTE — Telephone Encounter (Signed)
Patient called stating that she was in today to see the MD and he gave her meds but none of them has refills. HTCZ, taztia xt, and meloxicam have no refills. Please advise. Patient also has question about her dx on the patient notes given to her today. Patient stated that she does not know what to do about her right leg pain. Please advise.

## 2011-05-16 NOTE — Progress Notes (Signed)
Subjective:    Patient ID: Sally Douglas, female    DOB: 05-06-1945, 66 y.o.   MRN: [id number]  HPI     CC: CPX and labs done.[name]  History of Present Illness:   66 year-old patient is seen today for an annual examination. Medical problems include hypertension, ongoing tobacco use. She has a history of exogenous obesity and chronic low back pain. No new concerns or complaints. No prior colonoscopy, which she again adamantly declines today and. Since her last visit here she has had a laparoscopic ventral hernia repair  Preventive Screening-Counseling & Management  Caffeine-Diet-Exercise  Does Patient Exercise: no   Problems Prior to Update:   1) Family History Breast Cancer 1st Degree Relative <50 (ICD-V16.3)  2) Physical Examination (ICD-V70.0)  3) Back Pain, Chronic (ICD-724.5)  4) Obesity (ICD-278.00)  5) Hypertension (ICD-401.9)   Medications Prior to Update:  1) Ambien 10 Mg Tabs (Zolpidem Tartrate) .... Take 1 Tablet By Mouth At Bedtime  2) Hydrochlorothiazide 25 Mg Tabs (Hydrochlorothiazide) .[name].. 1 Once Daily  3) Meloxicam 15 Mg Tabs (Meloxicam) .[name].. 1 Once Daily  4) Skelaxin 800 Mg Tabs (Metaxalone) .... One Twice Daily As Needed  5) Diltiazem Hcl Er Beads 360 Mg Cp24 (Diltiazem Hcl Er Beads) .... One By Mouth Daily  6) Vicodin 5-500 Mg Tabs (Hydrocodone-Acetaminophen) .... One By Mouth Q 4-6 Hrs As Needed Pain   Allergies:  No Known Drug Allergies   Past History:  Past Medical History:  Reviewed history from 02/10/2007 and no changes required.  Hypertension  Insomnia  Obesity  Menopausal syndrome  tobacco use  low back pain  history bronchitis  history of impaired glucose tolerance  history of abnormal Pap   Past Surgical History:  GYN surgery  Tonsillectomy age 4  status post D&C  gravida one, para one, aborta zero  colonoscopy-declines   Family History:  Reviewed history from 02/10/2007 and no changes required.  father died age 23, cerebral, hemorrhage;   mother died age 26 History, diabetes, breast cancer.  One brother two sisters positive for renal cancer, diabetes, and breast cancer  Family History Breast cancer 1st degree relative <50  Family History Kidney disease   Social History:  Reviewed history from 02/10/2007 and no changes required.  Married  Current Smoker-  Regular exercise-no  Does Patient Exercise: no  1. Risk factors, based on past  M,S,F history- cardiovascular risk factors include hypertension and ongoing tobacco use  2.  Physical activities: No exercise limitations but very sedentary. Does have chronic low back pain  3.  Depression/mood: No history of depression or mood disorder  4.  Hearing: No significant deficits  5.  ADL's: Independent in all aspects of daily living  6.  Fall risk: Moderate due to obesity  7.  Home safety:  8.  Height weight, and visual acuity; height and weight stable no change in visual acuity  9.  Counseling: Regular exercise weight loss encouraged  10. Lab orders based on risk factors: Lipid profile and TSH will be reviewed  11. Referral : Followup Gen. surgery  12. Care plan: Mammogram encouraged  13. Cognitive assessment: Alert and oriented with normal affect no cognitive dysfunction      Review of Systems  Constitutional: Negative for fever, appetite change, fatigue and unexpected weight change.  HENT: Negative for hearing loss, ear pain, nosebleeds, congestion, sore throat, mouth sores, trouble swallowing, neck stiffness, dental problem, voice change, sinus pressure and tinnitus.   Eyes: Negative for photophobia, pain, redness  and visual disturbance.  Respiratory: Negative for cough, chest tightness and shortness of breath.   Cardiovascular: Negative for chest pain, palpitations and leg swelling.  Gastrointestinal: Positive for abdominal pain. Negative for nausea, vomiting, diarrhea, constipation, blood in stool, abdominal distention and rectal pain.  Genitourinary:  Negative for dysuria, urgency, frequency, hematuria, flank pain, vaginal bleeding, vaginal discharge, difficulty urinating, genital sores, vaginal pain, menstrual problem and pelvic pain.  Musculoskeletal: Positive for back pain. Negative for arthralgias.  Skin: Negative for rash.  Neurological: Negative for dizziness, syncope, speech difficulty, weakness, light-headedness, numbness and headaches.  Hematological: Negative for adenopathy. Does not bruise/bleed easily.  Psychiatric/Behavioral: Negative for suicidal ideas, behavioral problems, self-injury, dysphoric mood and agitation. The patient is not nervous/anxious.        Objective:   Physical Exam  Constitutional: She is oriented to person, place, and time. She appears well-developed and well-nourished.  HENT:  Head: Normocephalic and atraumatic.  Right Ear: External ear normal.  Left Ear: External ear normal.  Mouth/Throat: Oropharynx is clear and moist.  Eyes: Conjunctivae and EOM are normal.  Neck: Normal range of motion. Neck supple. No JVD present. No thyromegaly present.  Cardiovascular: Normal rate, regular rhythm, normal heart sounds and intact distal pulses.   No murmur heard. Pulmonary/Chest: Effort normal and breath sounds normal. She has no wheezes. She has no rales.  Abdominal: Soft. Bowel sounds are normal. She exhibits no distension and no mass. There is no tenderness. There is no rebound and no guarding.        Nicely healed laparoscopic scars  Musculoskeletal: Normal range of motion. She exhibits no edema and no tenderness.  Neurological: She is alert and oriented to person, place, and time. She has normal reflexes. No cranial nerve deficit. She exhibits normal muscle tone. Coordination normal.  Skin: Skin is warm and dry. No rash noted.  Psychiatric: She has a normal mood and affect. Her behavior is normal.          Assessment & Plan:   Preventive health examination Status post laparoscopic repair of  incarcerated ventral hernia Chronic low back pain Exogenous obesity Ongoing tobacco use. Smoking cessation encouraged  We'll check a lipid profile TSH Recheck 6 months Medications refill

## 2011-07-13 ENCOUNTER — Other Ambulatory Visit: Payer: Self-pay | Admitting: Internal Medicine

## 2011-09-02 ENCOUNTER — Other Ambulatory Visit: Payer: Self-pay | Admitting: Internal Medicine

## 2011-09-02 MED ORDER — HYDROCODONE-ACETAMINOPHEN 10-325 MG PO TABS: 1.0000 | ORAL_TABLET | Freq: Four times a day (QID) | ORAL | Status: AC | PRN

## 2011-09-02 NOTE — Telephone Encounter (Signed)
I dont see where we got request for this med - last seen 05/16/11 Last written 05/16/11 #100 2RF Please advise

## 2011-09-02 NOTE — Telephone Encounter (Signed)
Pt called and wants to know why Dr Amador Cunas denied her HYDROcodone-acetaminophen (NORCO) 10-325 MG per tablet. Pt needs something for pain. [facility] in [place] and [address] and [place] [phone]. Pt req to get a call back from nurse.

## 2011-09-02 NOTE — Telephone Encounter (Signed)
Called in - pharmacy to call pt when ready for pick up

## 2011-09-02 NOTE — Telephone Encounter (Signed)
ok 

## 2011-11-11 ENCOUNTER — Encounter: Payer: Self-pay | Admitting: Internal Medicine

## 2011-11-11 ENCOUNTER — Ambulatory Visit (INDEPENDENT_AMBULATORY_CARE_PROVIDER_SITE_OTHER): Payer: Medicare Other | Admitting: Internal Medicine

## 2011-11-11 VITALS — BP 130/84 | Temp 98.0°F | Wt 195.0 lb

## 2011-11-11 DIAGNOSIS — M549 Dorsalgia, unspecified: Secondary | ICD-10-CM

## 2011-11-11 DIAGNOSIS — I10 Essential (primary) hypertension: Secondary | ICD-10-CM

## 2011-11-11 DIAGNOSIS — F17200 Nicotine dependence, unspecified, uncomplicated: Secondary | ICD-10-CM

## 2011-11-11 MED ORDER — HYDROCHLOROTHIAZIDE 25 MG PO TABS: 25.0000 mg | ORAL_TABLET | ORAL | Status: DC

## 2011-11-11 MED ORDER — AMITRIPTYLINE HCL 25 MG PO TABS: ORAL_TABLET | ORAL | Status: DC

## 2011-11-11 MED ORDER — ZOLPIDEM TARTRATE 10 MG PO TABS: 10.0000 mg | ORAL_TABLET | Freq: Every evening | ORAL | Status: DC | PRN

## 2011-11-11 MED ORDER — MELOXICAM 15 MG PO TABS: 15.0000 mg | ORAL_TABLET | Freq: Every day | ORAL | Status: DC

## 2011-11-11 MED ORDER — DILTIAZEM HCL ER BEADS 360 MG PO CP24: 360.0000 mg | ORAL_CAPSULE | ORAL | Status: DC

## 2011-11-11 NOTE — Progress Notes (Signed)
Subjective:    Patient ID: Sally Douglas, female    DOB: 10-22-1945, 66 y.o.   MRN: [id number]  HPI  66 year old patient who is seen today for followup. She has hypertension which has been well-controlled. She has a history of ongoing tobacco use and chronic low back pain. She is doing reasonably well today. Probably will complaint is a large irritated pedunculated nevus underneath her left breast.  Past Medical History  Diagnosis Date  . Hypertension   . Insomnia   . Obesity   . Menopausal syndrome   . Tobacco abuse   . Low back pain   . Bronchitis   . Impaired glucose tolerance   . Abnormal finding on Pap smear     hx abnl pap  . Ventral hernia     History   Social History  . Marital Status: Married    Spouse Name: N/A    Number of Children: N/A  . Years of Education: N/A   Occupational History  . Not on file.   Social History Main Topics  . Smoking status: Current Every Day Smoker -- 0.5 packs/day for 25 years    Types: Cigarettes  . Smokeless tobacco: Never Used  . Alcohol Use: No     occassional  . Drug Use: No  . Sexually Active: Not on file   Other Topics Concern  . Not on file   Social History Narrative   gavidia 1Para 1 aborta 0    Past Surgical History  Procedure Date  . Gyn surgery   . Tonsillectomy   . Dilation and curettage of uterus   . Ventral hernia repair 01/24/2011    Procedure: LAPAROSCOPIC VENTRAL HERNIA;  Surgeon: Ardeth Sportsman, MD;  Location: [facility];  Service: General;  Laterality: N/A;  with Mesh    Family History  Problem Relation Age of Onset  . Cancer Brother     renal ,also sister  . Diabetes Brother     and sister  . Breast cancer      sister  . Kidney disease    . Cancer Mother     Breast Cancer    No Known Allergies  Current Outpatient Prescriptions on File Prior to Visit  Medication Sig Dispense Refill  . acetaminophen (TYLENOL) 500 MG tablet Take 500-1,000 mg by mouth every 6 (six) hours as needed. PAIN         . amitriptyline (ELAVIL) 25 MG tablet 1-2- tablets to control pain - not to get too sleepy  150 tablet  2  . amoxicillin (AMOXIL) 500 MG capsule DENTAL      . aspirin 81 MG tablet Take 81 mg by mouth every morning.       . diltiazem (TIAZAC) 360 MG 24 hr capsule Take 1 capsule (360 mg total) by mouth every morning.  90 capsule  6  . hydrochlorothiazide (HYDRODIURIL) 25 MG tablet Take 1 tablet (25 mg total) by mouth every morning.  90 tablet  6  . meloxicam (MOBIC) 15 MG tablet Take 1 tablet (15 mg total) by mouth daily.  90 tablet  6  . polyethylene glycol powder (GLYCOLAX/MIRALAX) powder Take 17 g by mouth daily.      [name] zolpidem (AMBIEN) 10 MG tablet Take 1 tablet (10 mg total) by mouth at bedtime as needed. SLEEP  30 tablet  2    BP 130/84  Temp 98 F (36.7 C) (Oral)  Wt 195 lb (88.451 kg)  Review of Systems  Constitutional: Negative.   HENT: Negative for hearing loss, congestion, sore throat, rhinorrhea, dental problem, sinus pressure and tinnitus.   Eyes: Negative for pain, discharge and visual disturbance.  Respiratory: Negative for cough and shortness of breath.   Cardiovascular: Negative for chest pain, palpitations and leg swelling.  Gastrointestinal: Negative for nausea, vomiting, abdominal pain, diarrhea, constipation, blood in stool and abdominal distention.  Genitourinary: Negative for dysuria, urgency, frequency, hematuria, flank pain, vaginal bleeding, vaginal discharge, difficulty urinating, vaginal pain and pelvic pain.  Musculoskeletal: Negative for joint swelling, arthralgias and gait problem.  Skin: Positive for wound. Negative for rash.  Neurological: Negative for dizziness, syncope, speech difficulty, weakness, numbness and headaches.  Hematological: Negative for adenopathy.  Psychiatric/Behavioral: Negative for behavioral problems, dysphoric mood and agitation. The patient is not nervous/anxious.        Objective:   Physical Exam  Constitutional:  She is oriented to person, place, and time. She appears well-developed and well-nourished.       Overweight. Blood pressure 130/80  HENT:  Head: Normocephalic.  Right Ear: External ear normal.  Left Ear: External ear normal.  Mouth/Throat: Oropharynx is clear and moist.  Eyes: Conjunctivae normal and EOM are normal. Pupils are equal, round, and reactive to light.  Neck: Normal range of motion. Neck supple. No thyromegaly present.  Cardiovascular: Normal rate, regular rhythm, normal heart sounds and intact distal pulses.   Pulmonary/Chest: Effort normal and breath sounds normal.  Abdominal: Soft. Bowel sounds are normal. She exhibits no mass. There is no tenderness.  Musculoskeletal: Normal range of motion.  Lymphadenopathy:    She has no cervical adenopathy.  Neurological: She is alert and oriented to person, place, and time.  Skin: Skin is warm and dry. No rash noted.       Large pedunculated pigmented nevus beneath the left breast  Psychiatric: She has a normal mood and affect. Her behavior is normal.          Assessment & Plan:   Hypertension well controlled Chronic low back pain Ongoing tobacco use. Total smoking cessation encouraged Irritated pedunculated nevus. We'll schedule for resection  Medicines renewed

## 2011-11-11 NOTE — Patient Instructions (Signed)
Limit your sodium (Salt) intake  Please check your blood pressure on a regular basis.  If it is consistently greater than 150/90, please make an office appointment.  Smoking tobacco is very bad for your health. You should stop smoking immediately. 

## 2011-11-14 ENCOUNTER — Ambulatory Visit: Payer: Medicare Other | Admitting: Internal Medicine

## 2011-11-19 ENCOUNTER — Encounter: Payer: Self-pay | Admitting: Internal Medicine

## 2011-11-19 ENCOUNTER — Ambulatory Visit (INDEPENDENT_AMBULATORY_CARE_PROVIDER_SITE_OTHER): Payer: Medicare Other | Admitting: Internal Medicine

## 2011-11-19 VITALS — BP 118/80 | Temp 98.5°F | Wt 195.0 lb

## 2011-11-19 DIAGNOSIS — L989 Disorder of the skin and subcutaneous tissue, unspecified: Secondary | ICD-10-CM

## 2011-11-19 DIAGNOSIS — D235 Other benign neoplasm of skin of trunk: Secondary | ICD-10-CM | POA: Diagnosis not present

## 2011-11-19 NOTE — Progress Notes (Signed)
  Subjective:    Patient ID: Sally Douglas, female    DOB: July 17, 1945, 66 y.o.   MRN: [id number]  HPI  66 year old patient who is in today for elective resection of a benign lesion beneath her left breast. This lesion frequently becomes irritated with aspects of daily living.    Review of Systems     Objective:   Physical Exam        Assessment & Plan:    benign pigmented and angulated lesion inferior to the left breast.  After Betadine prep and local anesthesia with 1% Xylocaine the lesion was resected.  Hemostasis obtained with pressure and cautery. Antibiotic on applied in the area dressed. Local wound care discussed

## 2011-11-19 NOTE — Patient Instructions (Signed)
Keep area clean and dry and apply bandage daily Call if there is any signs of infection such as redness pain or drainage

## 2011-11-29 ENCOUNTER — Other Ambulatory Visit: Payer: Self-pay | Admitting: Family Medicine

## 2011-11-29 MED ORDER — HYDROCODONE-ACETAMINOPHEN 10-325 MG PO TABS: 1.0000 | ORAL_TABLET | Freq: Four times a day (QID) | ORAL | Status: DC | PRN

## 2011-11-29 NOTE — Telephone Encounter (Signed)
Spoke with [facility] - they have everything we sent on 10-14 , pt is requesting RF on norco 10-325 Last written 09/02/11/ 100 2RF Please advise

## 2011-11-29 NOTE — Telephone Encounter (Signed)
Ok;  No further refills for 3 full months

## 2011-11-29 NOTE — Telephone Encounter (Signed)
Pt was in office twice in last couple of weeks. States her refills were to be called in to [facility] on [address], but they are not there. Please send in refills for all meds. Pt is quite upset and will be out of some meds over the weekend. Thanks.

## 2011-11-29 NOTE — Telephone Encounter (Signed)
Called in to [facility] - spoke with pharmacist

## 2012-01-08 ENCOUNTER — Other Ambulatory Visit: Payer: Self-pay | Admitting: Internal Medicine

## 2012-02-27 ENCOUNTER — Other Ambulatory Visit: Payer: Self-pay | Admitting: Internal Medicine

## 2012-02-28 NOTE — Telephone Encounter (Signed)
Pt following up on request on refills.

## 2012-03-14 ENCOUNTER — Other Ambulatory Visit: Payer: Self-pay

## 2012-05-06 ENCOUNTER — Other Ambulatory Visit: Payer: Self-pay | Admitting: Internal Medicine

## 2012-05-12 ENCOUNTER — Encounter: Payer: Self-pay | Admitting: Internal Medicine

## 2012-05-12 ENCOUNTER — Ambulatory Visit (INDEPENDENT_AMBULATORY_CARE_PROVIDER_SITE_OTHER): Payer: Medicare Other | Admitting: Internal Medicine

## 2012-05-12 VITALS — BP 150/90 | HR 89 | Temp 98.2°F | Resp 20 | Wt 190.0 lb

## 2012-05-12 DIAGNOSIS — F17200 Nicotine dependence, unspecified, uncomplicated: Secondary | ICD-10-CM

## 2012-05-12 DIAGNOSIS — I10 Essential (primary) hypertension: Secondary | ICD-10-CM

## 2012-05-12 DIAGNOSIS — E66813 Obesity, class 3: Secondary | ICD-10-CM

## 2012-05-12 MED ORDER — HYDROCHLOROTHIAZIDE 25 MG PO TABS: 25.0000 mg | ORAL_TABLET | ORAL | Status: DC

## 2012-05-12 MED ORDER — AMITRIPTYLINE HCL 25 MG PO TABS: ORAL_TABLET | ORAL | Status: DC

## 2012-05-12 MED ORDER — MELOXICAM 15 MG PO TABS: 15.0000 mg | ORAL_TABLET | Freq: Every day | ORAL | Status: DC

## 2012-05-12 MED ORDER — ZOLPIDEM TARTRATE 10 MG PO TABS: ORAL_TABLET | ORAL | Status: DC

## 2012-05-12 MED ORDER — HYDROCODONE-ACETAMINOPHEN 10-325 MG PO TABS: ORAL_TABLET | ORAL | Status: DC

## 2012-05-12 MED ORDER — DILTIAZEM HCL ER BEADS 360 MG PO CP24: 360.0000 mg | ORAL_CAPSULE | ORAL | Status: DC

## 2012-05-12 NOTE — Patient Instructions (Signed)
Limit your sodium (Salt) intake  Please check your blood pressure on a regular basis.  If it is consistently greater than 150/90, please make an office appointment.  Smoking tobacco is very bad for your health. You should stop smoking immediately.  Return in 6 months for follow-up  

## 2012-05-12 NOTE — Progress Notes (Signed)
Subjective:    Patient ID: Sally Douglas, female    DOB: May 26, 1945, 67 y.o.   MRN: [id number]  HPI  67 year old patient who is seen today for her biannual followup. Chronic medical problems include obesity ongoing tobacco use and hypertension. She has been under situational stress due to the recent loss of her brother.  Past Medical History  Diagnosis Date  . Hypertension   . Insomnia   . Obesity   . Menopausal syndrome   . Tobacco abuse   . Low back pain   . Bronchitis   . Impaired glucose tolerance   . Abnormal finding on Pap smear     hx abnl pap  . Ventral hernia     History   Social History  . Marital Status: Married    Spouse Name: N/A    Number of Children: N/A  . Years of Education: N/A   Occupational History  . Not on file.   Social History Main Topics  . Smoking status: Current Every Day Smoker -- 0.50 packs/day for 25 years    Types: Cigarettes  . Smokeless tobacco: Never Used  . Alcohol Use: No     Comment: occassional  . Drug Use: No  . Sexually Active: Not on file   Other Topics Concern  . Not on file   Social History Narrative   gavidia 1   Para 1    aborta 0    Past Surgical History  Procedure Laterality Date  . Gyn surgery    . Tonsillectomy    . Dilation and curettage of uterus    . Ventral hernia repair  01/24/2011    Procedure: LAPAROSCOPIC VENTRAL HERNIA;  Surgeon: Ardeth Sportsman, MD;  Location: [facility];  Service: General;  Laterality: N/A;  with Mesh    Family History  Problem Relation Age of Onset  . Cancer Brother     renal ,also sister  . Diabetes Brother     and sister  . Breast cancer      sister  . Kidney disease    . Cancer Mother     Breast Cancer    No Known Allergies  Current Outpatient Prescriptions on File Prior to Visit  Medication Sig Dispense Refill  . aspirin 81 MG tablet Take 81 mg by mouth every morning.       . polyethylene glycol powder (GLYCOLAX/MIRALAX) powder Take 17 g by mouth daily.       No  current facility-administered medications on file prior to visit.    BP 150/90  Pulse 89  Temp(Src) 98.2 F (36.8 C) (Oral)  Resp 20  Wt 190 lb (86.183 kg)  BMI 40.77 kg/m2  SpO2 94%       Review of Systems  Constitutional: Negative.   HENT: Negative for hearing loss, congestion, sore throat, rhinorrhea, dental problem, sinus pressure and tinnitus.   Eyes: Negative for pain, discharge and visual disturbance.  Respiratory: Negative for cough and shortness of breath.   Cardiovascular: Negative for chest pain, palpitations and leg swelling.  Gastrointestinal: Negative for nausea, vomiting, abdominal pain, diarrhea, constipation, blood in stool and abdominal distention.  Genitourinary: Negative for dysuria, urgency, frequency, hematuria, flank pain, vaginal bleeding, vaginal discharge, difficulty urinating, vaginal pain and pelvic pain.  Musculoskeletal: Negative for joint swelling, arthralgias and gait problem.  Skin: Negative for rash.  Neurological: Negative for dizziness, syncope, speech difficulty, weakness, numbness and headaches.  Hematological: Negative for adenopathy.  Psychiatric/Behavioral: Positive for sleep disturbance. Negative for  behavioral problems, dysphoric mood and agitation. The patient is nervous/anxious.        Objective:   Physical Exam  Constitutional: She is oriented to person, place, and time. She appears well-developed and well-nourished.  HENT:  Head: Normocephalic.  Right Ear: External ear normal.  Left Ear: External ear normal.  Mouth/Throat: Oropharynx is clear and moist.  Eyes: Conjunctivae and EOM are normal. Pupils are equal, round, and reactive to light.  Neck: Normal range of motion. Neck supple. No thyromegaly present.  Cardiovascular: Normal rate, regular rhythm, normal heart sounds and intact distal pulses.   Pulmonary/Chest: Effort normal and breath sounds normal.  Abdominal: Soft. Bowel sounds are normal. She exhibits no mass. There  is no tenderness.  Musculoskeletal: Normal range of motion.  Lymphadenopathy:    She has no cervical adenopathy.  Neurological: She is alert and oriented to person, place, and time.  Skin: Skin is warm and dry. No rash noted.  Psychiatric: She has a normal mood and affect. Her behavior is normal.          Assessment & Plan:   Hypertension. Blood pressure high normal at 140/88. We'll continue present regimen weight loss exercise low salt diet all encouraged Tobacco abuse. Smoking cessation encouraged Exogenous obesity  All medicines refilled CPX 6 months

## 2012-06-08 ENCOUNTER — Other Ambulatory Visit: Payer: Self-pay | Admitting: Internal Medicine

## 2012-06-10 ENCOUNTER — Encounter: Payer: Self-pay | Admitting: *Deleted

## 2012-06-10 ENCOUNTER — Encounter: Payer: Self-pay | Admitting: Internal Medicine

## 2012-07-02 ENCOUNTER — Other Ambulatory Visit: Payer: Self-pay | Admitting: Internal Medicine

## 2012-08-02 ENCOUNTER — Other Ambulatory Visit: Payer: Self-pay | Admitting: Internal Medicine

## 2012-08-05 ENCOUNTER — Other Ambulatory Visit: Payer: Self-pay | Admitting: Internal Medicine

## 2012-09-01 DIAGNOSIS — IMO0002 Reserved for concepts with insufficient information to code with codable children: Secondary | ICD-10-CM | POA: Diagnosis not present

## 2012-09-01 DIAGNOSIS — M48061 Spinal stenosis, lumbar region without neurogenic claudication: Secondary | ICD-10-CM | POA: Diagnosis not present

## 2012-09-03 ENCOUNTER — Other Ambulatory Visit: Payer: Self-pay | Admitting: Internal Medicine

## 2012-09-08 ENCOUNTER — Emergency Department (HOSPITAL_COMMUNITY)
Admission: EM | Admit: 2012-09-08 | Discharge: 2012-09-08 | Disposition: A | Discharge status: Home / Self Care | Payer: Medicare Other | Attending: Emergency Medicine | Admitting: Emergency Medicine

## 2012-09-08 ENCOUNTER — Encounter (HOSPITAL_COMMUNITY): Payer: Self-pay | Admitting: *Deleted

## 2012-09-08 DIAGNOSIS — G8929 Other chronic pain: Secondary | ICD-10-CM | POA: Insufficient documentation

## 2012-09-08 DIAGNOSIS — M545 Low back pain, unspecified: Secondary | ICD-10-CM | POA: Diagnosis not present

## 2012-09-08 DIAGNOSIS — Z8719 Personal history of other diseases of the digestive system: Secondary | ICD-10-CM | POA: Insufficient documentation

## 2012-09-08 DIAGNOSIS — J40 Bronchitis, not specified as acute or chronic: Secondary | ICD-10-CM | POA: Insufficient documentation

## 2012-09-08 DIAGNOSIS — I10 Essential (primary) hypertension: Secondary | ICD-10-CM | POA: Diagnosis not present

## 2012-09-08 DIAGNOSIS — Z8742 Personal history of other diseases of the female genital tract: Secondary | ICD-10-CM | POA: Insufficient documentation

## 2012-09-08 DIAGNOSIS — Z79899 Other long term (current) drug therapy: Secondary | ICD-10-CM | POA: Diagnosis not present

## 2012-09-08 DIAGNOSIS — E669 Obesity, unspecified: Secondary | ICD-10-CM | POA: Insufficient documentation

## 2012-09-08 DIAGNOSIS — G4700 Insomnia, unspecified: Secondary | ICD-10-CM | POA: Diagnosis not present

## 2012-09-08 DIAGNOSIS — F17200 Nicotine dependence, unspecified, uncomplicated: Secondary | ICD-10-CM | POA: Insufficient documentation

## 2012-09-08 DIAGNOSIS — Z9889 Other specified postprocedural states: Secondary | ICD-10-CM | POA: Insufficient documentation

## 2012-09-08 MED ORDER — HYDROMORPHONE HCL PF 1 MG/ML IJ SOLN
1.0000 mg | Freq: Once | INTRAMUSCULAR | Status: AC
  Administered 2012-09-08: 1 mg via INTRAMUSCULAR
  Filled 2012-09-08: qty 1

## 2012-09-08 MED ORDER — DIAZEPAM 5 MG PO TABS
5.0000 mg | ORAL_TABLET | Freq: Once | ORAL | Status: AC
  Administered 2012-09-08: 5 mg via ORAL
  Filled 2012-09-08: qty 1

## 2012-09-08 MED ORDER — OXYCODONE-ACETAMINOPHEN 5-325 MG PO TABS: 1.0000 | ORAL_TABLET | Freq: Four times a day (QID) | ORAL | Status: DC | PRN

## 2012-09-08 NOTE — ED Provider Notes (Signed)
CSN: [id number]     Arrival date & time 09/08/12  2003 History    This chart was scribed for a non-physician practitioner, Fayrene Helper, PA-C, working with Claudean Kinds, MD by Frederik Pear, ED Scribe. This patient was seen in room WTR8/WTR8 and the patient's care was started at 2031.   First MD Initiated Contact with Patient 09/08/12 2031     Chief Complaint  Patient presents with  . Back Pain   (Consider location/radiation/quality/duration/timing/severity/associated sxs/prior Treatment) The history is provided by the patient and medical records. No language interpreter was used.    HPI Comments: Sally Douglas is a 67 y.o. female with a h/o of chronic back pain who presents to the Emergency Department complaining of severe, constant back pain that is aggravated by bending over and initiating movement from a sitting to standing position and alleviated by sitting that began 2 weeks ago, but significantly worsened 2 days ago. She reports she the current pain is consistent with her chronic pain, but is much more severe. She denies urinary and fecal incontinence, fever, dysuria, hematochezia, and hematuria. No h/o of IV drug use. She states she saw her orthopedist, Dr. Delice Bison, who scheduled her for an MRI, but was unable to have it performed until 8/14. She has been treating the pain at home with her prescribed hydrocodone, but denies any relief. 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  Past Medical History  Diagnosis Date  . Hypertension   . Insomnia   . Obesity   . Menopausal syndrome   . Tobacco abuse   . Low back pain   . Bronchitis   . Impaired glucose tolerance   . Abnormal finding on Pap smear     hx abnl pap  . Ventral hernia    Past Surgical History  Procedure Laterality Date  . Gyn surgery    . Tonsillectomy    . Dilation and curettage of uterus    . Ventral hernia repair  01/24/2011     Procedure: LAPAROSCOPIC VENTRAL HERNIA;  Surgeon: Ardeth Sportsman, MD;  Location: [facility];  Service: General;  Laterality: N/A;  with Mesh   Family History  Problem Relation Age of Onset  . Cancer Brother     renal ,also sister  . COPD Brother   . Hypertension Brother   . Diabetes Brother   . Kidney disease Brother   . Diabetes Brother     and sister  . Breast cancer      sister  . Kidney disease    . Cancer Mother     Breast Cancer  . Diabetes Mother   . Hypertension Mother   . Glaucoma Mother   . Diabetes Sister   . Hypertension Sister   . Heart disease Sister    History  Substance Use Topics  . Smoking status: Current Every Day Smoker -- 0.50 packs/day for 25 years    Types: Cigarettes  . Smokeless tobacco: Never Used  . Alcohol Use: No     Comment: occassional   OB History   Grav Para Term Preterm Abortions TAB SAB Ect Mult Living                 Review of Systems  Musculoskeletal: Positive for back pain.  All other systems reviewed and are negative.   Allergies  Review of patient's allergies indicates no known allergies.  Home Medications  Current Outpatient Rx  Name  Route  Sig  Dispense  Refill  . amitriptyline (ELAVIL) 25 MG tablet      1-2- tablets to control pain - not to get too sleepy   150 tablet   2   . aspirin 81 MG tablet   Oral   Take 81 mg by mouth every morning.          . diltiazem (TIAZAC) 360 MG 24 hr capsule   Oral   Take 1 capsule (360 mg total) by mouth every morning.   90 capsule   3   . hydrochlorothiazide (HYDRODIURIL) 25 MG tablet   Oral   Take 1 tablet (25 mg total) by mouth every morning.   90 tablet   3   . HYDROcodone-acetaminophen (NORCO) 10-325 MG per tablet      TAKE 1 TABLET BY MOUTH EVERY 6 HOURS AS NEEDED FOR PAIN(MUST LAST 30 DAYS)   100 tablet   0   . meloxicam (MOBIC) 15 MG tablet   Oral   Take 1 tablet (15 mg total) by mouth daily.   90 tablet   3   . methylPREDNISolone (MEDROL DOSEPAK) 4  MG tablet   Oral   Take 4 mg by mouth See admin instructions. follow package directions         . polyethylene glycol powder (GLYCOLAX/MIRALAX) powder   Oral   Take 17 g by mouth daily.         [name] zolpidem (AMBIEN) 10 MG tablet   Oral   Take 10 mg by mouth at bedtime as needed for sleep.         [name] oxyCODONE-acetaminophen (PERCOCET/ROXICET) 5-325 MG per tablet   Oral   Take 1-2 tablets by mouth every 6 (six) hours as needed for pain.   5 tablet   0    BP 204/87  Pulse 86  Temp(Src) 98.4 F (36.9 C) (Oral)  Resp 18  SpO2 95% Physical Exam  Nursing note and vitals reviewed. Constitutional: She is oriented to person, place, and time. She appears well-developed and well-nourished. No distress.  HENT:  Head: Normocephalic and atraumatic.  Eyes: EOM are normal.  Neck: Neck supple. No tracheal deviation present.  Cardiovascular: Normal rate and intact distal pulses.   Pulmonary/Chest: Effort normal. No respiratory distress.  Musculoskeletal: Normal range of motion. She exhibits tenderness. She exhibits no edema.  Tenderness to the lumbar spine and muscles of the para-lumbar spine with palpation that is worse on the right No crepitus and no step offs. Increased pain with initiation of movement.  Neurological: She is alert and oriented to person, place, and time.  Patella DTR intact bilat, no foot drop  Skin: Skin is warm and dry.  Psychiatric: She has a normal mood and affect. Her behavior is normal.   ED Course   Procedures (including critical care time)  DIAGNOSTIC STUDIES: Oxygen Saturation is 95% on adequate, adequate by my interpretation.    COORDINATION OF CARE:  20:45- Discussed planned course of treatment with the patient, including Dilaudid and valium in the ED,who is agreeable at this time. Will discharge the pt with a short course of Percocet for pain control. She should keep her scheduled MRI and continue to follow up with her orthopedist.  21:35- Pt's BP  is elevated at 204/87. Will have the nursing staff recheck BP and ambulate the pt before discharge.  21:57- After rechecking her vital signs, her BP is 166/97. Nursing staff indicates she is  able to move from her wheelchair to a standing position with one assist and able to ambulate from wheelchair to door and back with minimal assistance. She reports she is feeling a little better, but still has problems getting up and down from a standing position.   Labs Reviewed - No data to display No results found. 1. Chronic low back pain     MDM  Pt's pain is acute on chronic and reproducible on exam. Low suspicion for aortic dissection. She is NV intact. Her BP is high, likely reflecting her pain. Will have it rechecked.   BP is improved. Pt is able to ambulate, but pain is still present; however, it is tolerable.  BP 166/97  Pulse 86  Temp(Src) 98.4 F (36.9 C) (Oral)  Resp 18  SpO2 97%  I have reviewed nursing notes and vital signs. I personally reviewed the imaging tests through PACS system  I reviewed available ER/hospitalization records thought the EMR    Fayrene Helper, PA-C 09/08/12 2200

## 2012-09-08 NOTE — ED Notes (Signed)
Pt able to move from wheelchair to standing position with one assist. Pt able to ambulate from wheelchair to door and back with minimal assistance. Pt states she feels a little better, but still has problems getting up and down from a sitting position.

## 2012-09-08 NOTE — ED Notes (Signed)
Pt reports that she has had chronic back pain for years and that it has been getting progressively worse over the last few weeks; pt states that she saw the Orthopedic last week and he did Xrays and gave her steroid dose pack and has an MRI scheduled for Thurs night; pt states that she has been in increase pain over the last 2 days and the Orthopedic advised to call her Primary Care. PCP advised that she was on pain medication and to wait for the MRI; pt states that she is in pain and is having trouble getting out of bed due to pain and up from a seated position; pt denies any numbness or tingling.

## 2012-09-10 DIAGNOSIS — M47817 Spondylosis without myelopathy or radiculopathy, lumbosacral region: Secondary | ICD-10-CM | POA: Diagnosis not present

## 2012-09-11 DIAGNOSIS — M48061 Spinal stenosis, lumbar region without neurogenic claudication: Secondary | ICD-10-CM | POA: Diagnosis not present

## 2012-09-11 NOTE — ED Provider Notes (Signed)
Medical screening examination/treatment/procedure(s) were performed by non-physician practitioner and as supervising physician I was immediately available for consultation/collaboration.   Macalister Arnaud Joseph Candus Braud, MD 09/11/12 1052 

## 2012-09-16 ENCOUNTER — Telehealth: Payer: Self-pay | Admitting: Internal Medicine

## 2012-09-16 ENCOUNTER — Other Ambulatory Visit: Payer: Self-pay | Admitting: Orthopedic Surgery

## 2012-09-16 DIAGNOSIS — M545 Low back pain: Secondary | ICD-10-CM

## 2012-09-16 NOTE — Telephone Encounter (Signed)
Patient Information:  Caller Name: Jocelin  Phone: [phone]  Patient: Sally Douglas, Sally Douglas  Gender: Female  DOB: Oct 13, 1945  Age: 66 Years  PCP: Eleonore Chiquito (Family Practice > 90yrs old)  Office Follow Up:  Does the office need to follow up with this patient?: Yes  Instructions For The Office: Requesting  callback from Dr. Amador Cunas regarding medication causing pedal edema. Can,also,be reached at cell [phone]. Ilene aware Dr. Amador Cunas not in office this afternoon and callback will be on 09/17/12   Symptoms  Reason For Call & Symptoms: Sally Douglas states she was seen by Dr. Yevette Edwards at [facility] on 09/01/12 due to severe back pain.Sally Douglas states she was seen in ED on 09/08/12 due to severe back pain- was given Oxycodone and Valium. "Did not like the way the Oxycodone made her feel."  Had MRI of back on 09/10/12 . States she was told  on 09/11/12 that she has severe arthritis and spinal stenosis. Was advised to have Physical Therapy and injections in her back. Was told she could receive " back injections on 09/08 /14 ". States " Physician acted like he did not want to treat me." "I don't understand why I would have to wait until 10/05/12 for injections". Sally Douglas now wants to schedule an appointment with Dr. Franky Macho. Does not want to be seen by Dr. Yevette Edwards again.  Had onset of swelling to feet on 09/15/12. Swelling is worse on 09/16/12. Is elevating feet on 09/16/12. [name] Per leg swelling and edema has see today in office disposition due to new moderate swelling of both feet. Declined appointment because she has no transportation until after 1630. Sally Douglas concerned that meds ordered by Dr. Yevette Edwards may be causing swelling- is concerned about Diclofenac Sodium- concerned she may be taking too much sodium. Dr. Yevette Edwards prescribed Hydrocodone 10 /325mg  2 tabs q4h prn, Flexeril 5  mg one tab tid and Diclofenac Sodium. Requesting callback from Dr. Amador Cunas at [phone] or cell # [phone] regarding  medications.  Sally Douglas is aware Dr. Amador Cunas will not be in office until 09/17/12.  Reviewed Health History In EMR: Yes  Reviewed Medications In EMR: Yes  Reviewed Allergies In EMR: Yes  Reviewed Surgeries / Procedures: Yes  Date of Onset of Symptoms: 09/15/2012  Treatments Tried: elevating feet  Treatments Tried Worked: Yes  Guideline(s) Used:  Leg Swelling and Edema  Disposition Per Guideline:   See Today in Office  Reason For Disposition Reached:   Moderate swelling of both ankles (e.g., swelling extends up to the knees) AND new onset or worsening  Advice Given:  N/A  Patient Refused Recommendation:  Patient Refused Care Advice  Patient refused appointment - states she has no transportation until after 16:30

## 2012-09-17 NOTE — Telephone Encounter (Signed)
Discontinue diclofenac Low-salt diet Keep legs elevated as much as possible Office visit if unimproved

## 2012-09-17 NOTE — Telephone Encounter (Signed)
Spoke with patient.  She will follow the instructions.  She said the swelling is not as bad today.  She will schedule an appointment if no improvent

## 2012-09-21 ENCOUNTER — Other Ambulatory Visit: Payer: Self-pay | Admitting: Orthopedic Surgery

## 2012-09-21 ENCOUNTER — Ambulatory Visit
Admission: RE | Admit: 2012-09-21 | Discharge: 2012-09-21 | Disposition: A | Discharge status: Home / Self Care | Payer: Medicare Other | Source: Ambulatory Visit | Attending: Orthopedic Surgery | Admitting: Orthopedic Surgery

## 2012-09-21 VITALS — BP 172/84 | HR 69

## 2012-09-21 DIAGNOSIS — M545 Low back pain, unspecified: Secondary | ICD-10-CM

## 2012-09-21 DIAGNOSIS — M549 Dorsalgia, unspecified: Secondary | ICD-10-CM

## 2012-09-21 DIAGNOSIS — M47817 Spondylosis without myelopathy or radiculopathy, lumbosacral region: Secondary | ICD-10-CM | POA: Diagnosis not present

## 2012-09-21 MED ORDER — IOHEXOL 180 MG/ML  SOLN
1.0000 mL | Freq: Once | INTRAMUSCULAR | Status: AC | PRN
  Administered 2012-09-21: 1 mL via INTRA_ARTICULAR

## 2012-09-21 MED ORDER — METHYLPREDNISOLONE ACETATE 40 MG/ML INJ SUSP (RADIOLOG
120.0000 mg | Freq: Once | INTRAMUSCULAR | Status: AC
  Administered 2012-09-21: 120 mg via INTRA_ARTICULAR

## 2012-09-29 DIAGNOSIS — M545 Low back pain: Secondary | ICD-10-CM | POA: Diagnosis not present

## 2012-10-02 DIAGNOSIS — M47817 Spondylosis without myelopathy or radiculopathy, lumbosacral region: Secondary | ICD-10-CM | POA: Diagnosis not present

## 2012-10-02 DIAGNOSIS — F17200 Nicotine dependence, unspecified, uncomplicated: Secondary | ICD-10-CM | POA: Diagnosis not present

## 2012-10-13 ENCOUNTER — Other Ambulatory Visit: Payer: Self-pay | Admitting: Neurological Surgery

## 2012-10-13 DIAGNOSIS — Q762 Congenital spondylolisthesis: Secondary | ICD-10-CM | POA: Diagnosis not present

## 2012-10-13 DIAGNOSIS — M48061 Spinal stenosis, lumbar region without neurogenic claudication: Secondary | ICD-10-CM | POA: Diagnosis not present

## 2012-10-19 ENCOUNTER — Telehealth: Payer: Self-pay | Admitting: Internal Medicine

## 2012-10-19 MED ORDER — HYDROCODONE-ACETAMINOPHEN 10-325 MG PO TABS: ORAL_TABLET | ORAL | Status: DC

## 2012-10-19 NOTE — Telephone Encounter (Signed)
Pt notified Rx refill request called into pharmacy.

## 2012-10-19 NOTE — Telephone Encounter (Signed)
Pt is requesting refill of HYDROcodone-acetaminophen (NORCO) 10-325 MG per tablet.

## 2012-10-26 ENCOUNTER — Encounter (HOSPITAL_COMMUNITY): Payer: Self-pay | Admitting: Pharmacy Technician

## 2012-10-26 NOTE — Pre-Procedure Instructions (Signed)
Sally Douglas  10/26/2012   Your procedure is scheduled on:  Wed, Oct 8 @ 8:30 AM  Report to [facility]  2 * 3 at 6:30 AM.  Call this number if you have problems the morning of surgery: [phone]   Remember:   Do not eat food or drink liquids after midnight.   Take these medicines the morning of surgery with A SIP OF WATER: Diltiazem(Tiazac) and Pain Pill(if needed)               Stop taking your Aspirin and Meloxicam.No Goody's,BC's,Aleve,Ibuprofen,Fish Oil,or any Herbal Medications   Do not wear jewelry, make-up or nail polish.  Do not wear lotions, powders, or perfumes. You may wear deodorant.  Do not shave 48 hours prior to surgery.   Do not bring valuables to the hospital.  [facility] is not responsible                  for any belongings or valuables.               Contacts, dentures or bridgework may not be worn into surgery.  Leave suitcase in the car. After surgery it may be brought to your room.  For patients admitted to the hospital, discharge time is determined by your                treatment team.               Special Instructions: Shower using CHG 2 nights before surgery and the night before surgery.  If you shower the day of surgery use CHG.  Use special wash - you have one bottle of CHG for all showers.  You should use approximately 1/3 of the bottle for each shower.   Please read over the following fact sheets that you were given: Pain Booklet, Coughing and Deep Breathing, Blood Transfusion Information, MRSA Information and Surgical Site Infection Prevention

## 2012-10-27 ENCOUNTER — Encounter (HOSPITAL_COMMUNITY)
Admission: RE | Admit: 2012-10-27 | Discharge: 2012-10-27 | Disposition: A | Discharge status: Home / Self Care | Payer: Medicare Other | Source: Ambulatory Visit | Attending: Neurological Surgery | Admitting: Neurological Surgery

## 2012-10-27 ENCOUNTER — Encounter (HOSPITAL_COMMUNITY)
Admission: RE | Admit: 2012-10-27 | Discharge: 2012-10-27 | Disposition: A | Discharge status: Home / Self Care | Payer: Medicare Other | Source: Ambulatory Visit | Attending: Anesthesiology | Admitting: Anesthesiology

## 2012-10-27 ENCOUNTER — Encounter (HOSPITAL_COMMUNITY): Payer: Self-pay

## 2012-10-27 DIAGNOSIS — Z01818 Encounter for other preprocedural examination: Secondary | ICD-10-CM | POA: Insufficient documentation

## 2012-10-27 DIAGNOSIS — Z01812 Encounter for preprocedural laboratory examination: Secondary | ICD-10-CM | POA: Insufficient documentation

## 2012-10-27 DIAGNOSIS — Z01810 Encounter for preprocedural cardiovascular examination: Secondary | ICD-10-CM | POA: Diagnosis not present

## 2012-10-27 HISTORY — DX: Effusion, unspecified joint: M25.40

## 2012-10-27 HISTORY — DX: Constipation, unspecified: K59.00

## 2012-10-27 HISTORY — DX: Unspecified osteoarthritis, unspecified site: M19.90

## 2012-10-27 HISTORY — DX: Pain in unspecified joint: M25.50

## 2012-10-27 LAB — TYPE AND SCREEN
ABO/RH(D): B NEG
Antibody Screen: NEGATIVE

## 2012-10-27 LAB — SURGICAL PCR SCREEN
MRSA, PCR: NEGATIVE
Staphylococcus aureus: NEGATIVE

## 2012-10-27 LAB — BASIC METABOLIC PANEL
Calcium: 9.6 mg/dL (ref 8.4–10.5)
Creatinine, Ser: 1.01 mg/dL (ref 0.50–1.10)
GFR calc Af Amer: 65 mL/min — ABNORMAL LOW (ref >90)
GFR calc non Af Amer: 56 mL/min — ABNORMAL LOW (ref >90)
Glucose, Bld: 84 mg/dL (ref 70–99)
Sodium: 138 mEq/L (ref 135–145)

## 2012-10-27 LAB — CBC
MCH: 30.4 pg (ref 26.0–34.0)
Platelets: 285 10*3/uL (ref 150–400)
RBC: 4.57 MIL/uL (ref 3.87–5.11)
RDW: 14.8 % (ref 11.5–15.5)
WBC: 11.9 10*3/uL — ABNORMAL HIGH (ref 4.0–10.5)

## 2012-10-27 LAB — ABO/RH: ABO/RH(D): B NEG

## 2012-10-27 NOTE — Progress Notes (Signed)
Anesthesia Chart Review:  Patient is a 67 year old female scheduled for L4-5 maximum acces PLIF on 11/04/12 by Dr. Yetta Barre. History includes smoking, morbid obesity, HTN, arthritis, insomnia, tonsillectomy, VHR, bronchitis.  PCP is Dr. Eleonore Chiquito, according to Epic she has a CPE scheduled with him on 10/29/12. I sent him a staff message notifying him of plans for surgery.  EKG on 10/27/12 showed NSR, possible inferior infarct (age undetermined).  She has Q wave in lead III and to a lesser extent aVF (slightly deeper than when compared to her 05/16/11 EKG).  She has had Q waves in lead III since at least 07/14/08.  CXR on 10/27/12 showed mild cardiomegaly, without acute disease.  Preoperative CBC, BMET from 10/27/12 noted. T&S done. Glucose 84.  I tend to think that her EKG is overall stable when compared to her EKG from 05/16/11.  I'll follow-up PCP records from her 10/29/12 visit once available.  Velna Ochs [facility]/Anesthesiology Phone [phone] 10/27/2012 4:02 PM

## 2012-10-27 NOTE — Progress Notes (Signed)
Pt doesn't have a cardiologist  Denies ever having an echo/stress test/heart cath  Medical Md is Dr.Peter Amador Cunas  Denies EKG and CXR within past yr

## 2012-10-29 ENCOUNTER — Ambulatory Visit (INDEPENDENT_AMBULATORY_CARE_PROVIDER_SITE_OTHER): Payer: Medicare Other | Admitting: Internal Medicine

## 2012-10-29 ENCOUNTER — Encounter: Payer: Self-pay | Admitting: Internal Medicine

## 2012-10-29 VITALS — BP 160/90 | HR 88 | Temp 98.2°F | Resp 20 | Ht <= 58 in | Wt 190.0 lb

## 2012-10-29 DIAGNOSIS — I10 Essential (primary) hypertension: Secondary | ICD-10-CM | POA: Diagnosis not present

## 2012-10-29 DIAGNOSIS — Z0000 Encounter for general adult medical examination without abnormal findings: Secondary | ICD-10-CM

## 2012-10-29 DIAGNOSIS — M549 Dorsalgia, unspecified: Secondary | ICD-10-CM

## 2012-10-29 NOTE — Patient Instructions (Signed)
Limit your sodium (Salt) intake  You need to lose weight.  Consider a lower calorie diet and regular exercise.  Smoking tobacco is very bad for your health. You should stop smoking immediately.  Schedule your colonoscopy to help detect colon cancer.  Schedule your mammogram.  Please check your blood pressure on a regular basis.  If it is consistently greater than 150/90, please make an office appointment.  Return in 6 months for follow-up

## 2012-10-29 NOTE — Progress Notes (Signed)
Subjective:    Patient ID: Sally Douglas, female    DOB: [birth year], 67 y.o.   MRN: [id number]  HPI      CC: CPX and labs done.[name]  History of Present Illness:   67  year-old patient is seen today for an annual examination. Medical problems include hypertension, ongoing tobacco use. She has a history of exogenous obesity and chronic low back pain. No new concerns or complaints. No prior colonoscopy, which she again adamantly declines today.  Her mother had a history of breast cancer. No recent mammograms She is scheduled for back surgery next week and apparently  has some significant spinal stenosis secondary to marked osteoarthritic changes  Preventive Screening-Counseling & Management  Caffeine-Diet-Exercise  Does Patient Exercise: no   Problems Prior to Update:   1) Family History Breast Cancer 1st Degree Relative <50 (ICD-V16.3)  2) Physical Examination (ICD-V70.0)  3) Back Pain, Chronic (ICD-724.5)  4) Obesity (ICD-278.00)  5) Hypertension (ICD-401.9)   Medications Prior to Update:  1) Ambien 10 Mg Tabs (Zolpidem Tartrate) .... Take 1 Tablet By Mouth At Bedtime  2) Hydrochlorothiazide 25 Mg Tabs (Hydrochlorothiazide) .[name].. 1 Once Daily  3) Meloxicam 15 Mg Tabs (Meloxicam) .[name].. 1 Once Daily  4) Skelaxin 800 Mg Tabs (Metaxalone) .... One Twice Daily As Needed  5) Diltiazem Hcl Er Beads 360 Mg Cp24 (Diltiazem Hcl Er Beads) .... One By Mouth Daily  6) Vicodin 5-500 Mg Tabs (Hydrocodone-Acetaminophen) .... One By Mouth Q 4-6 Hrs As Needed Pain   Allergies:  No Known Drug Allergies   Past History:  Past Medical History:   Hypertension  Insomnia  Obesity  Menopausal syndrome  tobacco use  low back pain  history bronchitis  history of impaired glucose tolerance  history of abnormal Pap   Past Surgical History:  GYN surgery  Tonsillectomy age 89  status post D&C  gravida one, para one, aborta zero  colonoscopy-declines  Ventral hernia repair 2012  Family History:    father died age 17, cerebral, hemorrhage;  mother died age 17 History, diabetes, breast cancer.  One brother two sisters positive for renal cancer, diabetes, and breast cancer  Family History Breast cancer 1st degree relative <50  Family History Kidney disease   Social History:   Married  Current Smoker-  Regular exercise-no  Does Patient Exercise: no   Past Medical History  Diagnosis Date  . Insomnia     takes Ambien nightly  . Obesity   . Menopausal syndrome   . Tobacco abuse   . Low back pain   . Bronchitis     hx of-many yrs ago  . Abnormal finding on Pap smear     hx abnl pap  . Hypertension     takes Diltiazem and HCTZ daily  . Constipation     takes Miralax daily  . Arthritis   . Joint pain   . Joint swelling     History   Social History  . Marital Status: Married    Spouse Name: N/A    Number of Children: N/A  . Years of Education: N/A   Occupational History  . Not on file.   Social History Main Topics  . Smoking status: Current Every Day Smoker -- 0.50 packs/day for 25 years    Types: Cigarettes  . Smokeless tobacco: Never Used  . Alcohol Use: No     Comment: occassional  . Drug Use: No  . Sexual Activity: Not Currently    Birth Control/ Protection:  Post-menopausal   Other Topics Concern  . Not on file   Social History Narrative   gavidia 1   Para 1    aborta 0    Past Surgical History  Procedure Laterality Date  . Tonsillectomy    . Dilation and curettage of uterus    . Ventral hernia repair  01/24/2011    Procedure: LAPAROSCOPIC VENTRAL HERNIA;  Surgeon: Ardeth Sportsman, MD;  Location: [facility];  Service: General;  Laterality: N/A;  with Mesh    Family History  Problem Relation Age of Onset  . Cancer Brother     renal ,also sister  . COPD Brother   . Hypertension Brother   . Diabetes Brother   . Kidney disease Brother   . Diabetes Brother     and sister  . Breast cancer      sister  . Kidney disease    . Cancer Mother      Breast Cancer  . Diabetes Mother   . Hypertension Mother   . Glaucoma Mother   . Diabetes Sister   . Hypertension Sister   . Heart disease Sister     No Known Allergies  Current Outpatient Prescriptions on File Prior to Visit  Medication Sig Dispense Refill  . amitriptyline (ELAVIL) 25 MG tablet Take 25-50 mg by mouth at bedtime.      [name] aspirin 81 MG tablet Take 81 mg by mouth every morning.       . diltiazem (TIAZAC) 360 MG 24 hr capsule Take 1 capsule (360 mg total) by mouth every morning.  90 capsule  3  . hydrochlorothiazide (HYDRODIURIL) 25 MG tablet Take 1 tablet (25 mg total) by mouth every morning.  90 tablet  3  . HYDROcodone-acetaminophen (NORCO) 10-325 MG per tablet Take 1 tablet by mouth every 6 (six) hours as needed for pain.      . polyethylene glycol powder (GLYCOLAX/MIRALAX) powder Take 17 g by mouth daily.      [name] zolpidem (AMBIEN) 10 MG tablet Take 10 mg by mouth at bedtime as needed for sleep.      . cyclobenzaprine (FLEXERIL) 5 MG tablet Take 5 mg by mouth 3 (three) times daily as needed for muscle spasms.      . meloxicam (MOBIC) 15 MG tablet Take 1 tablet (15 mg total) by mouth daily.  90 tablet  3   No current facility-administered medications on file prior to visit.    BP 160/90  Pulse 88  Temp(Src) 98.2 F (36.8 C) (Oral)  Resp 20  Ht 4' 8.75" (1.441 m)  Wt 190 lb (86.183 kg)  BMI 41.5 kg/m2  SpO2 98%     1. Risk factors, based on past  M,S,F history- cardiovascular risk factors include hypertension and ongoing tobacco use  2.  Physical activities: No exercise limitations but very sedentary. Does have chronic low back pain  3.  Depression/mood: No history of depression or mood disorder  4.  Hearing: No significant deficits  5.  ADL's: Independent in all aspects of daily living  6.  Fall risk: Moderate due to obesity. Walks with a cane due to severe back pain  7.  Home safety: No problems identified  8.  Height weight, and visual acuity;  height and weight stable no change in visual acuity  9.  Counseling: Regular exercise weight loss encouraged  10. Lab orders based on risk factors: Lipid profile and TSH will be reviewed  11. Referral : Followup  neurosurgery  12. Care plan: Mammogram encouraged  13. Cognitive assessment: Alert and oriented with normal affect no cognitive dysfunction      Review of Systems  Constitutional: Negative for fever, appetite change, fatigue and unexpected weight change.  HENT: Negative for hearing loss, ear pain, nosebleeds, congestion, sore throat, mouth sores, trouble swallowing, neck stiffness, dental problem, voice change, sinus pressure and tinnitus.   Eyes: Negative for photophobia, pain, redness and visual disturbance.  Respiratory: Negative for cough, chest tightness and shortness of breath.   Cardiovascular: Negative for chest pain, palpitations and leg swelling.  Gastrointestinal: Positive for abdominal pain. Negative for nausea, vomiting, diarrhea, constipation, blood in stool, abdominal distention and rectal pain.  Genitourinary: Negative for dysuria, urgency, frequency, hematuria, flank pain, vaginal bleeding, vaginal discharge, difficulty urinating, genital sores, vaginal pain, menstrual problem and pelvic pain.  Musculoskeletal: Positive for back pain. Negative for arthralgias.  Skin: Negative for rash.  Neurological: Negative for dizziness, syncope, speech difficulty, weakness, light-headedness, numbness and headaches.  Hematological: Negative for adenopathy. Does not bruise/bleed easily.  Psychiatric/Behavioral: Negative for suicidal ideas, behavioral problems, self-injury, dysphoric mood and agitation. The patient is not nervous/anxious.        Objective:   Physical Exam  Constitutional: She is oriented to person, place, and time. She appears well-developed and well-nourished.  HENT:  Head: Normocephalic and atraumatic.  Right Ear: External ear normal.  Left Ear:  External ear normal.  Mouth/Throat: Oropharynx is clear and moist.  Eyes: Conjunctivae and EOM are normal.  Neck: Normal range of motion. Neck supple. No JVD present. No thyromegaly present.  Cardiovascular: Normal rate, regular rhythm, normal heart sounds and intact distal pulses.   No murmur heard. Left dorsalis pedis pulse diminished  Pulmonary/Chest: Effort normal and breath sounds normal. She has no wheezes. She has no rales.  Abdominal: Soft. Bowel sounds are normal. She exhibits no distension and no mass. There is no tenderness. There is no rebound and no guarding.   Nicely healed laparoscopic scars  Musculoskeletal: Normal range of motion. She exhibits no edema and no tenderness.  Right bunion  Neurological: She is alert and oriented to person, place, and time. She has normal reflexes. No cranial nerve deficit. She exhibits normal muscle tone. Coordination normal.  Skin: Skin is warm and dry. No rash noted.  Onychomycotic toenails  Psychiatric: She has a normal mood and affect. Her behavior is normal.          Assessment & Plan:   Preventive health examination Status post laparoscopic repair of incarcerated ventral hernia Chronic low back pain Exogenous obesity Ongoing tobacco use. Smoking cessation encouraged Annual mammogram encouraged   We'll check a lipid profile TSH Recheck 6 months Medications refill

## 2012-11-03 MED ORDER — CEFAZOLIN SODIUM-DEXTROSE 2-3 GM-% IV SOLR
2.0000 g | INTRAVENOUS | Status: AC
  Administered 2012-11-04: 2 g via INTRAVENOUS

## 2012-11-04 ENCOUNTER — Encounter (HOSPITAL_COMMUNITY): Payer: Medicare Other | Admitting: Vascular Surgery

## 2012-11-04 ENCOUNTER — Inpatient Hospital Stay (HOSPITAL_COMMUNITY): Payer: Medicare Other

## 2012-11-04 ENCOUNTER — Encounter (HOSPITAL_COMMUNITY): Payer: Self-pay | Admitting: *Deleted

## 2012-11-04 ENCOUNTER — Inpatient Hospital Stay (HOSPITAL_COMMUNITY): Payer: Medicare Other | Admitting: Anesthesiology

## 2012-11-04 ENCOUNTER — Encounter (HOSPITAL_COMMUNITY)
Admission: RE | Disposition: A | Discharge status: Home / Self Care | Payer: Self-pay | Source: Ambulatory Visit | Attending: Neurological Surgery

## 2012-11-04 ENCOUNTER — Inpatient Hospital Stay (HOSPITAL_COMMUNITY)
Admission: RE | Admit: 2012-11-04 | Discharge: 2012-11-05 | DRG: 460 | Disposition: A | Discharge status: Home / Self Care | Payer: Medicare Other | Source: Ambulatory Visit | Attending: Neurological Surgery | Admitting: Neurological Surgery

## 2012-11-04 DIAGNOSIS — Z836 Family history of other diseases of the respiratory system: Secondary | ICD-10-CM

## 2012-11-04 DIAGNOSIS — E669 Obesity, unspecified: Secondary | ICD-10-CM | POA: Diagnosis present

## 2012-11-04 DIAGNOSIS — I10 Essential (primary) hypertension: Secondary | ICD-10-CM | POA: Diagnosis present

## 2012-11-04 DIAGNOSIS — F17200 Nicotine dependence, unspecified, uncomplicated: Secondary | ICD-10-CM | POA: Diagnosis present

## 2012-11-04 DIAGNOSIS — Z79899 Other long term (current) drug therapy: Secondary | ICD-10-CM

## 2012-11-04 DIAGNOSIS — IMO0002 Reserved for concepts with insufficient information to code with codable children: Secondary | ICD-10-CM | POA: Diagnosis present

## 2012-11-04 DIAGNOSIS — Z8249 Family history of ischemic heart disease and other diseases of the circulatory system: Secondary | ICD-10-CM

## 2012-11-04 DIAGNOSIS — Z6841 Body Mass Index (BMI) 40.0 and over, adult: Secondary | ICD-10-CM | POA: Diagnosis not present

## 2012-11-04 DIAGNOSIS — M4310 Spondylolisthesis, site unspecified: Secondary | ICD-10-CM | POA: Diagnosis not present

## 2012-11-04 DIAGNOSIS — Z841 Family history of disorders of kidney and ureter: Secondary | ICD-10-CM | POA: Diagnosis not present

## 2012-11-04 DIAGNOSIS — K5900 Constipation, unspecified: Secondary | ICD-10-CM | POA: Diagnosis present

## 2012-11-04 DIAGNOSIS — M519 Unspecified thoracic, thoracolumbar and lumbosacral intervertebral disc disorder: Secondary | ICD-10-CM | POA: Diagnosis not present

## 2012-11-04 DIAGNOSIS — M48061 Spinal stenosis, lumbar region without neurogenic claudication: Secondary | ICD-10-CM | POA: Diagnosis not present

## 2012-11-04 DIAGNOSIS — G4700 Insomnia, unspecified: Secondary | ICD-10-CM | POA: Diagnosis present

## 2012-11-04 DIAGNOSIS — Z7982 Long term (current) use of aspirin: Secondary | ICD-10-CM

## 2012-11-04 DIAGNOSIS — N951 Menopausal and female climacteric states: Secondary | ICD-10-CM | POA: Diagnosis present

## 2012-11-04 DIAGNOSIS — Y849 Medical procedure, unspecified as the cause of abnormal reaction of the patient, or of later complication, without mention of misadventure at the time of the procedure: Secondary | ICD-10-CM | POA: Diagnosis present

## 2012-11-04 DIAGNOSIS — S3421XA Injury of nerve root of lumbar spine, initial encounter: Secondary | ICD-10-CM | POA: Diagnosis present

## 2012-11-04 DIAGNOSIS — Z803 Family history of malignant neoplasm of breast: Secondary | ICD-10-CM

## 2012-11-04 DIAGNOSIS — Z23 Encounter for immunization: Secondary | ICD-10-CM

## 2012-11-04 DIAGNOSIS — G988 Other disorders of nervous system: Secondary | ICD-10-CM | POA: Diagnosis present

## 2012-11-04 DIAGNOSIS — I621 Nontraumatic extradural hemorrhage: Secondary | ICD-10-CM | POA: Diagnosis not present

## 2012-11-04 DIAGNOSIS — Z8051 Family history of malignant neoplasm of kidney: Secondary | ICD-10-CM

## 2012-11-04 DIAGNOSIS — Q762 Congenital spondylolisthesis: Principal | ICD-10-CM

## 2012-11-04 DIAGNOSIS — Z981 Arthrodesis status: Secondary | ICD-10-CM

## 2012-11-04 DIAGNOSIS — Z833 Family history of diabetes mellitus: Secondary | ICD-10-CM | POA: Diagnosis not present

## 2012-11-04 DIAGNOSIS — M549 Dorsalgia, unspecified: Secondary | ICD-10-CM | POA: Diagnosis not present

## 2012-11-04 HISTORY — PX: MAXIMUM ACCESS (MAS)POSTERIOR LUMBAR INTERBODY FUSION (PLIF) 1 LEVEL: SHX6368

## 2012-11-04 SURGERY — FOR MAXIMUM ACCESS (MAS) POSTERIOR LUMBAR INTERBODY FUSION (PLIF) 1 LEVEL
Anesthesia: General | Site: Back | Wound class: Clean

## 2012-11-04 MED ORDER — ACETAMINOPHEN 650 MG RE SUPP: 650.0000 mg | RECTAL | Status: DC | PRN

## 2012-11-04 MED ORDER — LIDOCAINE HCL (CARDIAC) 20 MG/ML IV SOLN
INTRAVENOUS | Status: DC | PRN
  Administered 2012-11-04: 100 mg via INTRAVENOUS

## 2012-11-04 MED ORDER — THROMBIN 20000 UNITS EX SOLR
CUTANEOUS | Status: DC | PRN
  Administered 2012-11-04: 09:00:00 via TOPICAL

## 2012-11-04 MED ORDER — PROPOFOL 10 MG/ML IV BOLUS
INTRAVENOUS | Status: DC | PRN
  Administered 2012-11-04: 110 mg via INTRAVENOUS

## 2012-11-04 MED ORDER — MIDAZOLAM HCL 5 MG/5ML IJ SOLN
INTRAMUSCULAR | Status: DC | PRN
  Administered 2012-11-04 (×2): 1 mg via INTRAVENOUS

## 2012-11-04 MED ORDER — HYDROMORPHONE HCL PF 1 MG/ML IJ SOLN
INTRAMUSCULAR | Status: AC
  Filled 2012-11-04: qty 1

## 2012-11-04 MED ORDER — HYDROCHLOROTHIAZIDE 25 MG PO TABS
25.0000 mg | ORAL_TABLET | ORAL | Status: DC
Start: 2012-11-05 — End: 2012-11-05
  Administered 2012-11-05: 25 mg via ORAL
  Filled 2012-11-04 (×2): qty 1

## 2012-11-04 MED ORDER — MORPHINE SULFATE 2 MG/ML IJ SOLN
1.0000 mg | INTRAMUSCULAR | Status: DC | PRN
  Administered 2012-11-04 – 2012-11-05 (×3): 4 mg via INTRAVENOUS
  Filled 2012-11-04 (×3): qty 2

## 2012-11-04 MED ORDER — HYDROMORPHONE HCL PF 1 MG/ML IJ SOLN
0.5000 mg | Freq: Once | INTRAMUSCULAR | Status: AC
  Administered 2012-11-04: 0.5 mg via INTRAVENOUS

## 2012-11-04 MED ORDER — ONDANSETRON HCL 4 MG/2ML IJ SOLN: 4.0000 mg | INTRAMUSCULAR | Status: DC | PRN

## 2012-11-04 MED ORDER — SODIUM CHLORIDE 0.9 % IJ SOLN
3.0000 mL | Freq: Two times a day (BID) | INTRAMUSCULAR | Status: DC
  Administered 2012-11-04: 3 mL via INTRAVENOUS

## 2012-11-04 MED ORDER — DEXAMETHASONE SODIUM PHOSPHATE 4 MG/ML IJ SOLN
4.0000 mg | Freq: Four times a day (QID) | INTRAMUSCULAR | Status: DC
  Filled 2012-11-04 (×4): qty 1

## 2012-11-04 MED ORDER — PROPOFOL INFUSION 10 MG/ML OPTIME
INTRAVENOUS | Status: DC | PRN
  Administered 2012-11-04: 50 ug/kg/min via INTRAVENOUS

## 2012-11-04 MED ORDER — DILTIAZEM HCL ER BEADS 240 MG PO CP24
360.0000 mg | ORAL_CAPSULE | ORAL | Status: DC
  Administered 2012-11-05: 08:00:00 360 mg via ORAL
  Filled 2012-11-04 (×2): qty 1

## 2012-11-04 MED ORDER — SUCCINYLCHOLINE CHLORIDE 20 MG/ML IJ SOLN
INTRAMUSCULAR | Status: DC | PRN
  Administered 2012-11-04: 140 mg via INTRAVENOUS

## 2012-11-04 MED ORDER — AMITRIPTYLINE HCL 25 MG PO TABS
25.0000 mg | ORAL_TABLET | Freq: Every day | ORAL | Status: DC
Start: 2012-11-04 — End: 2012-11-05
  Filled 2012-11-04 (×2): qty 1

## 2012-11-04 MED ORDER — POLYETHYLENE GLYCOL 3350 17 G PO PACK
17.0000 g | PACK | Freq: Every day | ORAL | Status: DC
  Filled 2012-11-04 (×2): qty 1

## 2012-11-04 MED ORDER — DEXAMETHASONE 4 MG PO TABS
4.0000 mg | ORAL_TABLET | Freq: Four times a day (QID) | ORAL | Status: DC
  Administered 2012-11-04 – 2012-11-05 (×4): 4 mg via ORAL
  Filled 2012-11-04 (×8): qty 1

## 2012-11-04 MED ORDER — POTASSIUM CHLORIDE IN NACL 20-0.9 MEQ/L-% IV SOLN
INTRAVENOUS | Status: DC
  Filled 2012-11-04 (×4): qty 1000

## 2012-11-04 MED ORDER — METHOCARBAMOL 500 MG PO TABS
500.0000 mg | ORAL_TABLET | Freq: Four times a day (QID) | ORAL | Status: DC | PRN
  Administered 2012-11-04 – 2012-11-05 (×3): 500 mg via ORAL
  Filled 2012-11-04 (×3): qty 1

## 2012-11-04 MED ORDER — BUPIVACAINE HCL (PF) 0.25 % IJ SOLN
INTRAMUSCULAR | Status: DC | PRN
  Administered 2012-11-04: 4 mL

## 2012-11-04 MED ORDER — OXYCODONE-ACETAMINOPHEN 5-325 MG PO TABS
1.0000 | ORAL_TABLET | ORAL | Status: DC | PRN
  Administered 2012-11-04 – 2012-11-05 (×6): 2 via ORAL
  Filled 2012-11-04 (×6): qty 2

## 2012-11-04 MED ORDER — FENTANYL CITRATE 0.05 MG/ML IJ SOLN
INTRAMUSCULAR | Status: DC | PRN
  Administered 2012-11-04: 50 ug via INTRAVENOUS
  Administered 2012-11-04: 100 ug via INTRAVENOUS
  Administered 2012-11-04 (×4): 50 ug via INTRAVENOUS

## 2012-11-04 MED ORDER — HYDROMORPHONE HCL PF 1 MG/ML IJ SOLN: 0.5000 mg | Freq: Once | INTRAMUSCULAR | Status: DC

## 2012-11-04 MED ORDER — CEFAZOLIN SODIUM 1-5 GM-% IV SOLN
1.0000 g | Freq: Three times a day (TID) | INTRAVENOUS | Status: AC
  Administered 2012-11-04 (×2): 1 g via INTRAVENOUS
  Filled 2012-11-04 (×2): qty 50

## 2012-11-04 MED ORDER — MENTHOL 3 MG MT LOZG: 1.0000 | LOZENGE | OROMUCOSAL | Status: DC | PRN

## 2012-11-04 MED ORDER — CEFAZOLIN SODIUM-DEXTROSE 2-3 GM-% IV SOLR
INTRAVENOUS | Status: AC
  Filled 2012-11-04: qty 50

## 2012-11-04 MED ORDER — POLYETHYLENE GLYCOL 3350 17 GM/SCOOP PO POWD
17.0000 g | Freq: Every day | ORAL | Status: DC
  Filled 2012-11-04: qty 255

## 2012-11-04 MED ORDER — OXYCODONE HCL 5 MG PO TABS
ORAL_TABLET | ORAL | Status: AC
  Filled 2012-11-04: qty 1

## 2012-11-04 MED ORDER — OXYCODONE HCL 5 MG PO TABS
5.0000 mg | ORAL_TABLET | Freq: Once | ORAL | Status: AC | PRN
  Administered 2012-11-04: 5 mg via ORAL

## 2012-11-04 MED ORDER — KETOROLAC TROMETHAMINE 30 MG/ML IJ SOLN
INTRAMUSCULAR | Status: AC
  Filled 2012-11-04: qty 1

## 2012-11-04 MED ORDER — ARTIFICIAL TEARS OP OINT
TOPICAL_OINTMENT | OPHTHALMIC | Status: DC | PRN
  Administered 2012-11-04: 1 via OPHTHALMIC

## 2012-11-04 MED ORDER — SODIUM CHLORIDE 0.9 % IR SOLN
Status: DC | PRN
  Administered 2012-11-04: 09:00:00

## 2012-11-04 MED ORDER — DEXTROSE 5 % IV SOLN
500.0000 mg | Freq: Four times a day (QID) | INTRAVENOUS | Status: DC | PRN
  Administered 2012-11-04: 500 mg via INTRAVENOUS
  Filled 2012-11-04: qty 5

## 2012-11-04 MED ORDER — PHENOL 1.4 % MT LIQD: 1.0000 | OROMUCOSAL | Status: DC | PRN

## 2012-11-04 MED ORDER — HYDROMORPHONE HCL PF 1 MG/ML IJ SOLN
0.2500 mg | INTRAMUSCULAR | Status: DC | PRN
  Administered 2012-11-04 (×5): 0.5 mg via INTRAVENOUS

## 2012-11-04 MED ORDER — MEPERIDINE HCL 25 MG/ML IJ SOLN: 6.2500 mg | INTRAMUSCULAR | Status: DC | PRN

## 2012-11-04 MED ORDER — HYDROMORPHONE HCL PF 1 MG/ML IJ SOLN
INTRAMUSCULAR | Status: AC
  Administered 2012-11-04: 0.5 mg
  Filled 2012-11-04: qty 1

## 2012-11-04 MED ORDER — ONDANSETRON HCL 4 MG/2ML IJ SOLN: 4.0000 mg | Freq: Once | INTRAMUSCULAR | Status: DC | PRN

## 2012-11-04 MED ORDER — ACETAMINOPHEN 325 MG PO TABS: 650.0000 mg | ORAL_TABLET | ORAL | Status: DC | PRN

## 2012-11-04 MED ORDER — INFLUENZA VAC SPLIT QUAD 0.5 ML IM SUSP
0.5000 mL | INTRAMUSCULAR | Status: AC
  Administered 2012-11-05: 0.5 mL via INTRAMUSCULAR
  Filled 2012-11-04: qty 0.5

## 2012-11-04 MED ORDER — LACTATED RINGERS IV SOLN
INTRAVENOUS | Status: DC | PRN
  Administered 2012-11-04 (×2): via INTRAVENOUS

## 2012-11-04 MED ORDER — KETOROLAC TROMETHAMINE 30 MG/ML IJ SOLN
30.0000 mg | Freq: Once | INTRAMUSCULAR | Status: AC
  Administered 2012-11-04: 30 mg via INTRAVENOUS

## 2012-11-04 MED ORDER — OXYCODONE HCL 5 MG/5ML PO SOLN: 5.0000 mg | Freq: Once | ORAL | Status: AC | PRN

## 2012-11-04 MED ORDER — DEXTROSE 5 % IV SOLN
INTRAVENOUS | Status: DC | PRN
  Administered 2012-11-04: 09:00:00 via INTRAVENOUS

## 2012-11-04 MED ORDER — ONDANSETRON HCL 4 MG/2ML IJ SOLN
INTRAMUSCULAR | Status: DC | PRN
  Administered 2012-11-04: 4 mg via INTRAMUSCULAR

## 2012-11-04 MED ORDER — 0.9 % SODIUM CHLORIDE (POUR BTL) OPTIME
TOPICAL | Status: DC | PRN
  Administered 2012-11-04: 1000 mL

## 2012-11-04 MED ORDER — SODIUM CHLORIDE 0.9 % IV SOLN: 250.0000 mL | INTRAVENOUS | Status: DC

## 2012-11-04 MED ORDER — ZOLPIDEM TARTRATE 5 MG PO TABS: 5.0000 mg | ORAL_TABLET | Freq: Every evening | ORAL | Status: DC | PRN

## 2012-11-04 MED ORDER — ASPIRIN 81 MG PO TABS
81.0000 mg | ORAL_TABLET | ORAL | Status: DC
  Administered 2012-11-05: 81 mg via ORAL
  Filled 2012-11-04 (×2): qty 1

## 2012-11-04 MED ORDER — SODIUM CHLORIDE 0.9 % IJ SOLN: 3.0000 mL | INTRAMUSCULAR | Status: DC | PRN

## 2012-11-04 SURGICAL SUPPLY — 68 items
APL SKNCLS STERI-STRIP NONHPOA (GAUZE/BANDAGES/DRESSINGS) ×1
BAG DECANTER FOR FLEXI CONT (MISCELLANEOUS) ×2 IMPLANT
BENZOIN TINCTURE PRP APPL 2/3 (GAUZE/BANDAGES/DRESSINGS) ×2 IMPLANT
BLADE SURG ROTATE 9660 (MISCELLANEOUS) IMPLANT
BONE MATRIX OSTEOCEL PRO MED (Bone Implant) ×1 IMPLANT
BUR MATCHSTICK NEURO 3.0 LAGG (BURR) ×2 IMPLANT
CAGE COROENT MP 8X23 (Cage) ×2 IMPLANT
CANISTER SUCTION 2500CC (MISCELLANEOUS) ×2 IMPLANT
CLIP NEUROVISION LG (CLIP) ×1 IMPLANT
CLOTH BEACON ORANGE TIMEOUT ST (SAFETY) ×1 IMPLANT
CONT SPEC 4OZ CLIKSEAL STRL BL (MISCELLANEOUS) ×4 IMPLANT
COVER BACK TABLE 24X17X13 BIG (DRAPES) IMPLANT
COVER TABLE BACK 60X90 (DRAPES) ×2 IMPLANT
DRAPE C-ARM 42X72 X-RAY (DRAPES) ×2 IMPLANT
DRAPE C-ARMOR (DRAPES) ×2 IMPLANT
DRAPE LAPAROTOMY 100X72X124 (DRAPES) ×2 IMPLANT
DRAPE POUCH INSTRU U-SHP 10X18 (DRAPES) ×2 IMPLANT
DRAPE SURG 17X23 STRL (DRAPES) ×2 IMPLANT
DRESSING TELFA 8X3 (GAUZE/BANDAGES/DRESSINGS) ×2 IMPLANT
DRSG OPSITE 4X5.5 SM (GAUZE/BANDAGES/DRESSINGS) ×3 IMPLANT
DRSG OPSITE POSTOP 4X6 (GAUZE/BANDAGES/DRESSINGS) ×1 IMPLANT
DURAPREP 26ML APPLICATOR (WOUND CARE) ×2 IMPLANT
ELECT REM PT RETURN 9FT ADLT (ELECTROSURGICAL) ×2
ELECTRODE REM PT RTRN 9FT ADLT (ELECTROSURGICAL) ×1 IMPLANT
EVACUATOR 1/8 PVC DRAIN (DRAIN) ×2 IMPLANT
GAUZE SPONGE 4X4 16PLY XRAY LF (GAUZE/BANDAGES/DRESSINGS) IMPLANT
GLOVE BIO SURGEON STRL SZ8 (GLOVE) ×4 IMPLANT
GLOVE BIOGEL PI IND STRL 7.0 (GLOVE) IMPLANT
GLOVE BIOGEL PI INDICATOR 7.0 (GLOVE) ×2
GLOVE SS BIOGEL STRL SZ 6.5 (GLOVE) IMPLANT
GLOVE SUPERSENSE BIOGEL SZ 6.5 (GLOVE) ×3
GOWN BRE IMP SLV AUR LG STRL (GOWN DISPOSABLE) ×1 IMPLANT
GOWN BRE IMP SLV AUR XL STRL (GOWN DISPOSABLE) ×4 IMPLANT
GOWN STRL REIN 2XL LVL4 (GOWN DISPOSABLE) IMPLANT
HEMOSTAT POWDER KIT SURGIFOAM (HEMOSTASIS) IMPLANT
KIT BASIN OR (CUSTOM PROCEDURE TRAY) ×2 IMPLANT
KIT NDL NVM5 EMG ELECT (KITS) IMPLANT
KIT NEEDLE NVM5 EMG ELECT (KITS) ×1 IMPLANT
KIT NEEDLE NVM5 EMG ELECTRODE (KITS) ×1
KIT ROOM TURNOVER OR (KITS) ×2 IMPLANT
MILL MEDIUM DISP (BLADE) ×1 IMPLANT
NDL HYPO 25X1 1.5 SAFETY (NEEDLE) ×1 IMPLANT
NEEDLE HYPO 25X1 1.5 SAFETY (NEEDLE) ×2 IMPLANT
NS IRRIG 1000ML POUR BTL (IV SOLUTION) ×2 IMPLANT
PACK LAMINECTOMY NEURO (CUSTOM PROCEDURE TRAY) ×2 IMPLANT
PAD ARMBOARD 7.5X6 YLW CONV (MISCELLANEOUS) ×6 IMPLANT
ROD 30MM (Rod) ×1 IMPLANT
ROD 35MM (Rod) ×1 IMPLANT
ROD PLIF MAS PB SPHERX 30 (Rod) IMPLANT
SCREW LOCK (Screw) ×8 IMPLANT
SCREW LOCK FXNS SPNE MAS PL (Screw) IMPLANT
SCREW MAS PLIF 5.5X30 (Screw) ×2 IMPLANT
SCREW PAS PLIF 5X30 (Screw) IMPLANT
SCREW SHANK 5.0X30MM (Screw) ×1 IMPLANT
SCREW SHANK 5.0X35 (Screw) ×1 IMPLANT
SCREW TULIP 5.5 (Screw) ×2 IMPLANT
SPONGE LAP 4X18 X RAY DECT (DISPOSABLE) IMPLANT
SPONGE SURGIFOAM ABS GEL 100 (HEMOSTASIS) ×2 IMPLANT
STRIP CLOSURE SKIN 1/2X4 (GAUZE/BANDAGES/DRESSINGS) ×3 IMPLANT
SUT VIC AB 0 CT1 18XCR BRD8 (SUTURE) ×1 IMPLANT
SUT VIC AB 0 CT1 8-18 (SUTURE) ×4
SUT VIC AB 2-0 CP2 18 (SUTURE) ×3 IMPLANT
SUT VIC AB 3-0 SH 8-18 (SUTURE) ×4 IMPLANT
SYR 20ML ECCENTRIC (SYRINGE) ×2 IMPLANT
TOWEL OR 17X24 6PK STRL BLUE (TOWEL DISPOSABLE) ×2 IMPLANT
TOWEL OR 17X26 10 PK STRL BLUE (TOWEL DISPOSABLE) ×2 IMPLANT
TRAY FOLEY CATH 14FRSI W/METER (CATHETERS) ×2 IMPLANT
WATER STERILE IRR 1000ML POUR (IV SOLUTION) ×2 IMPLANT

## 2012-11-04 NOTE — Anesthesia Preprocedure Evaluation (Addendum)
Anesthesia Evaluation  Patient identified by MRN, date of birth, ID band Patient awake    Reviewed: Allergy & Precautions, H&P , NPO status , Patient's Chart, lab work & pertinent test results, reviewed documented beta blocker date and time   Airway Mallampati: I TM Distance: >3 FB Neck ROM: Full    Dental  (+) Teeth Intact, Missing and Dental Advisory Given   Pulmonary Current Smoker,  Using "e-cigarettes" in attempt to quit tobacco         Cardiovascular hypertension, Pt. on medications     Neuro/Psych    GI/Hepatic   Endo/Other    Renal/GU      Musculoskeletal   Abdominal   Peds  Hematology   Anesthesia Other Findings   Reproductive/Obstetrics                           Anesthesia Physical Anesthesia Plan  ASA: II  Anesthesia Plan: General   Post-op Pain Management:    Induction: Intravenous  Airway Management Planned: Oral ETT  Additional Equipment:   Intra-op Plan:   Post-operative Plan: Extubation in OR  Informed Consent: I have reviewed the patients History and Physical, chart, labs and discussed the procedure including the risks, benefits and alternatives for the proposed anesthesia with the patient or authorized representative who has indicated his/her understanding and acceptance.     Plan Discussed with: CRNA and Surgeon  Anesthesia Plan Comments:         Anesthesia Quick Evaluation

## 2012-11-04 NOTE — Progress Notes (Signed)
Orthopedic Tech Progress Note Patient Details:  Sally Douglas 17-Jul-1945 [id number]  Patient ID: [name], female   DOB: 13-Dec-1945, 67 y.o.   MRN: [id number] Called in bio-tech brace order; spoke with Anderson Malta, Mckenlee Mangham 11/04/2012, 4:32 PM

## 2012-11-04 NOTE — Progress Notes (Signed)
Orthopedic Tech Progress Note Patient Details:  Sally Douglas [birth year] [id number]  Patient ID: Sally Douglas, female   DOB: March 03, 1945, 67 y.o.   MRN: [id number] Sally Douglas, Sally Douglas 11/04/2012, 5:11 PM

## 2012-11-04 NOTE — Progress Notes (Signed)
UR COMPLETED  

## 2012-11-04 NOTE — OR Nursing (Signed)
Patient had Neuro Monitoring provided by Nuvasive. Staff nurses placed the needles and assessed the sites pre and post.

## 2012-11-04 NOTE — Anesthesia Postprocedure Evaluation (Signed)
Anesthesia Post Note  Patient: Sally Douglas  Procedure(s) Performed: Procedure(s) (LRB): Lumbar four-five Maximum access posterior lumbar interbody fusion with Nuvasive (N/A)  Anesthesia type: general  Patient location: PACU  Post pain: Pain level controlled  Post assessment: Patient's Cardiovascular Status Stable  Last Vitals:  Filed Vitals:   11/04/12 1230  BP: 169/74  Pulse:   Temp:   Resp:     Post vital signs: Reviewed and stable  Level of consciousness: sedated  Complications: No apparent anesthesia complications

## 2012-11-04 NOTE — Op Note (Signed)
11/04/2012  11:34 AM  PATIENT:  Sally Douglas  67 y.o. female  PRE-OPERATIVE DIAGNOSIS:  Spondylolisthesis with stenosis L4-5 with back and leg pain  POST-OPERATIVE DIAGNOSIS:  Same  PROCEDURE:   1. Decompressive lumbar laminectomy (Gill type) L4-5 requiring more work than would be required for a simple exposure of the disk for PLIF in order to adequately decompress the neural elements and address the spinal stenosis 2. Posterior lumbar interbody fusion L4-5 using PEEK interbody cages packed with morcellized allograft and autograft 3. Posterior fixation L4-5 using cortical pedicle screws.    SURGEON:  Marikay Alar, MD  ASSISTANTS: Dr. Jeral Fruit  ANESTHESIA:  General  EBL: 140 ml  Total I/O In: 1400 [I.V.:1400] Out: 460 [Urine:310; Blood:150]  BLOOD ADMINISTERED:none  DRAINS: Hemovac   INDICATION FOR PROCEDURE: This patient presented with a long history of back and bilateral leg pain. She had an MRI and plain films which showed a mobile spondylolisthesis at L4-5 with severe spinal stenosis. She tried medical management and injection therapy without relief. Recommended a decompressive laminectomy and instrumented fusion. Patient understood the risks, benefits, and alternatives and potential outcomes and wished to proceed.  PROCEDURE DETAILS:  The patient was brought to the operating room. After induction of generalized endotracheal anesthesia the patient was rolled into the prone position on chest rolls and all pressure points were padded. The patient's lumbar region was cleaned and then prepped with DuraPrep and draped in the usual sterile fashion. Anesthesia was injected and then a dorsal midline incision was made and carried down to the lumbosacral fascia. The fascia was opened and the paraspinous musculature was taken down in a subperiosteal fashion to expose L4-5. A self-retaining retractor was placed. Intraoperative fluoroscopy confirmed my level, and started with placement of the  L4 cortical pedicle screws. The pedicle screw entry zones were identified utilizing surface landmarks and  AP and lateral fluoroscopy. I scored the cortex with the high-speed drill and then used the hand drill and EMG monitoring to drill an upward and outward direction into the pedicle. I then tapped line to line, and the tap was also monitored. I then placed a 5-0 x 30 mm cortical pedicle screw into the pedicles of L4 bilaterally. I then turned my attention to the decompression and the spinous process was removed and complete lumbar laminectomies, hemi- facetectomies, and foraminotomies were performed at L4-5. She was found to have a epidural hematoma, most likely from her epidural steroid injections. This was removed.The patient had significant spinal stenosis and this required more work than would be required for a simple exposure of the disc for posterior lumbar interbody fusion. Much more generous decompression was undertaken in order to adequately decompress the neural elements and address the patient's leg pain. The yellow ligament was removed to expose the underlying dura and nerve roots, and generous foraminotomies were performed to adequately decompress the neural elements. Both the exiting and traversing nerve roots were decompressed on both sides until a coronary dilator passed easily along the nerve roots. Once the decompression was complete, I turned my attention to the posterior lower lumbar interbody fusion. The epidural venous vasculature was coagulated and cut sharply. Disc space was incised and the initial discectomy was performed with pituitary rongeurs. The disc space was distracted with sequential distractors to a height of 9 mm. We then used a series of scrapers and shavers to prepare the endplates for fusion. The midline was prepared with Epstein curettes. Once the complete discectomy was finished, we packed an appropriate  sized peek interbody cage with local autograft and morcellized  allograft, gently retracted the nerve root, and tapped the cage into position at L4-5.  The midline between the cages was packed with morselized autograft and allograft. We then turned our attention to the placement of the lower pedicle screws. The pedicle screw entry zones were identified utilizing surface landmarks and fluoroscopy. I drilled into each pedicle utilizing the hand drill and EMG monitoring, and tapped each pedicle with the appropriate tap. We palpated with a ball probe to assure no break in the cortex. We then placed 5-0 by 30 mm cortical pedicle screws into the pedicles bilaterally at L5.  We then placed lordotic rods into the multiaxial screw heads of the pedicle screws and locked these in position with the locking caps and anti-torque device. We then checked our construct with AP and lateral fluoroscopy. Irrigated with copious amounts of bacitracin-containing saline solution. Placed a medium Hemovac drain through separate stab incision. Inspected the nerve roots once again to assure adequate decompression, lined to the dura with Gelfoam, and closed the muscle and the fascia with 0 Vicryl. Closed the subcutaneous tissues with 2-0 Vicryl and subcuticular tissues with 3-0 Vicryl. The skin was closed with benzoin and Steri-Strips. Dressing was then applied, the patient was awakened from general a3nesthesia and transported to the recovery room in stable condition. At the end of the procedure all sponge, needle and instrument counts were correct.   PLAN OF CARE: Admit to inpatient   PATIENT DISPOSITION:  PACU - hemodynamically stable.   Delay start of Pharmacological VTE agent (>24hrs) due to surgical blood loss or risk of bleeding:  yes

## 2012-11-04 NOTE — Preoperative (Signed)
Beta Blockers   Reason not to administer Beta Blockers:Not Applicable 

## 2012-11-04 NOTE — H&P (Signed)
Subjective: Patient is a 67 y.o. female admitted for PLIF L4-5. Onset of symptoms was several years ago, gradually worsening since that time.  The pain is rated intense, and is located at the across the lower back and radiates to legs. The pain is described as aching and throbbing and occurs all day. The symptoms have been progressive. Symptoms are exacerbated by exercise. MRI or CT showed stenosis with segmental instability.   Past Medical History  Diagnosis Date  . Insomnia     takes Ambien nightly  . Obesity   . Menopausal syndrome   . Tobacco abuse   . Low back pain   . Bronchitis     hx of-many yrs ago  . Abnormal finding on Pap smear     hx abnl pap  . Hypertension     takes Diltiazem and HCTZ daily  . Constipation     takes Miralax daily  . Arthritis   . Joint pain   . Joint swelling     Past Surgical History  Procedure Laterality Date  . Tonsillectomy    . Dilation and curettage of uterus    . Ventral hernia repair  01/24/2011    Procedure: LAPAROSCOPIC VENTRAL HERNIA;  Surgeon: Ardeth Sportsman, MD;  Location: [facility];  Service: General;  Laterality: N/A;  with Mesh    Prior to Admission medications   Medication Sig Start Date End Date Taking? Authorizing Provider  amitriptyline (ELAVIL) 25 MG tablet Take 25-50 mg by mouth at bedtime.   Yes Historical Provider, MD  aspirin 81 MG tablet Take 81 mg by mouth every morning.    Yes Historical Provider, MD  cyclobenzaprine (FLEXERIL) 5 MG tablet Take 5 mg by mouth 3 (three) times daily as needed for muscle spasms.   Yes Historical Provider, MD  diltiazem (TIAZAC) 360 MG 24 hr capsule Take 1 capsule (360 mg total) by mouth every morning. 05/12/12  Yes Gordy Savers, MD  hydrochlorothiazide (HYDRODIURIL) 25 MG tablet Take 1 tablet (25 mg total) by mouth every morning. 05/12/12  Yes Gordy Savers, MD  HYDROcodone-acetaminophen [facility]) 10-325 MG per tablet Take 1 tablet by mouth every 6 (six) hours as needed for pain.    Yes Historical Provider, MD  meloxicam (MOBIC) 15 MG tablet Take 1 tablet (15 mg total) by mouth daily. 05/12/12  Yes Gordy Savers, MD  polyethylene glycol powder (GLYCOLAX/MIRALAX) powder Take 17 g by mouth daily.   Yes Historical Provider, MD  zolpidem (AMBIEN) 10 MG tablet Take 10 mg by mouth at bedtime as needed for sleep.   Yes Historical Provider, MD   No Known Allergies  History  Substance Use Topics  . Smoking status: Current Every Day Smoker -- 0.50 packs/day for 25 years    Types: Cigarettes  . Smokeless tobacco: Never Used  . Alcohol Use: No     Comment: occassional    Family History  Problem Relation Age of Onset  . Cancer Brother     renal ,also sister  . COPD Brother   . Hypertension Brother   . Diabetes Brother   . Kidney disease Brother   . Diabetes Brother     and sister  . Breast cancer      sister  . Kidney disease    . Cancer Mother     Breast Cancer  . Diabetes Mother   . Hypertension Mother   . Glaucoma Mother   . Diabetes Sister   . Hypertension Sister   .  Heart disease Sister      Review of Systems  Positive ROS: neg  All other systems have been reviewed and were otherwise negative with the exception of those mentioned in the HPI and as above.  Objective: Vital signs in last 24 hours: Temp:  [98.1 F (36.7 C)] 98.1 F (36.7 C) (10/08 0649) Pulse Rate:  [68] 68 (10/08 0649) Resp:  [18] 18 (10/08 0649) BP: (206)/(77) 206/77 mmHg (10/08 0649) SpO2:  [97 %] 97 % (10/08 0649)  General Appearance: Alert, cooperative, no distress, appears stated age Head: Normocephalic, without obvious abnormality, atraumatic Eyes: PERRL, conjunctiva/corneas clear, EOM's intact    Neck: Supple, symmetrical, trachea midline Back: Symmetric, no curvature, ROM normal, no CVA tenderness Lungs:  respirations unlabored Heart: Regular rate and rhythm Abdomen: Soft, non-tender Extremities: Extremities normal, atraumatic, no cyanosis or edema Pulses: 2+ and  symmetric all extremities Skin: Skin color, texture, turgor normal, no rashes or lesions  NEUROLOGIC:   Mental status: Alert and oriented x4,  no aphasia, good attention span, fund of knowledge, and memory Motor Exam - grossly normal Sensory Exam - grossly normal Reflexes: 1+ Coordination - grossly normal Gait - not tested Balance - not tested Cranial Nerves: I: smell Not tested  II: visual acuity  OS: nl    OD: nl  II: visual fields Full to confrontation  II: pupils Equal, round, reactive to light  III,VII: ptosis None  III,IV,VI: extraocular muscles  Full ROM  V: mastication Normal  V: facial light touch sensation  Normal  V,VII: corneal reflex  Present  VII: facial muscle function - upper  Normal  VII: facial muscle function - lower Normal  VIII: hearing Not tested  IX: soft palate elevation  Normal  IX,X: gag reflex Present  XI: trapezius strength  5/5  XI: sternocleidomastoid strength 5/5  XI: neck flexion strength  5/5  XII: tongue strength  Normal    Data Review Lab Results  Component Value Date   WBC 11.9* 10/27/2012   HGB 13.9 10/27/2012   HCT 41.0 10/27/2012   MCV 89.7 10/27/2012   PLT 285 10/27/2012   Lab Results  Component Value Date   NA 138 10/27/2012   K 3.6 10/27/2012   CL 96 10/27/2012   CO2 32 10/27/2012   BUN 19 10/27/2012   CREATININE 1.01 10/27/2012   GLUCOSE 84 10/27/2012   No results found for this basename: INR, PROTIME    Assessment/Plan: Patient admitted for PLIF L4-5. Patient has failed a reasonable attempt at conservative therapy.  I explained the condition and procedure to the patient and answered any questions.  Patient wishes to proceed with procedure as planned. Understands risks/ benefits and typical outcomes of procedure.   Erasmus Bistline S 11/04/2012 8:06 AM

## 2012-11-04 NOTE — Transfer of Care (Signed)
Immediate Anesthesia Transfer of Care Note  Patient: Sally Douglas  Procedure(s) Performed: Procedure(s) with comments: Lumbar four-five Maximum access posterior lumbar interbody fusion with Nuvasive (N/A) - Lumbar four-five Maximum access posterior lumbar interbody fusion with Nuvasive  Patient Location: PACU  Anesthesia Type:General  Level of Consciousness: awake and patient cooperative  Airway & Oxygen Therapy: Patient Spontanous Breathing and Patient connected to face mask oxygen  Post-op Assessment: Post -op Vital signs reviewed and stable; Patient moving all extremities.  Post vital signs: Reviewed and stable  Complications: No apparent anesthesia complications

## 2012-11-05 MED ORDER — OXYCODONE-ACETAMINOPHEN 5-325 MG PO TABS: 1.0000 | ORAL_TABLET | ORAL | Status: DC | PRN

## 2012-11-05 MED ORDER — CYCLOBENZAPRINE HCL 5 MG PO TABS: 5.0000 mg | ORAL_TABLET | Freq: Three times a day (TID) | ORAL | Status: DC | PRN

## 2012-11-05 NOTE — Progress Notes (Signed)
Occupational Therapy Evaluation Patient Details Name: Sally Douglas MRN: [id number] DOB: [birth year] Today's Date: 11/05/2012 Time: 2130-8657 OT Time Calculation (min): 35 min  OT Assessment / Plan / Recommendation History of present illness 67 y.o. female admitted to [facility] hospital on 11/04/12 for elective lumbar L4/5 decompression and PLIF.     Clinical Impression   Completed all education regarding ADL, compensatory techniques, back precautions, AE, DME. Husband present for education. Demonstrated understanding. Written information given.Pt ready for d/C when medically stable.     OT Assessment  Patient does not need any further OT services    Follow Up Recommendations  No OT follow up    Barriers to Discharge      Equipment Recommendations  None recommended by OT    Recommendations for Other Services    Frequency       Precautions / Restrictions Precautions Precautions: Back Precaution Booklet Issued: Yes (comment) Precaution Comments: handout given and back precautions reviewed with pt/husband Required Braces or Orthoses: Spinal Brace Spinal Brace: Lumbar corset;Applied in sitting position   Pertinent Vitals/Pain no apparent distress     ADL  Grooming: Supervision/safety;Set up Where Assessed - Grooming: Unsupported standing Upper Body Bathing: Supervision/safety;Set up Where Assessed - Upper Body Bathing: Unsupported sitting Lower Body Bathing: Minimal assistance Where Assessed - Lower Body Bathing: Unsupported sit to stand Upper Body Dressing: Supervision/safety;Set up Where Assessed - Upper Body Dressing: Unsupported sitting Lower Body Dressing: Minimal assistance Where Assessed - Lower Body Dressing: Unsupported sit to stand Toilet Transfer: Supervision/safety Toilet Transfer Method: Sit to Barista: Comfort height toilet Toileting - Clothing Manipulation and Hygiene: Moderate assistance Where Assessed - Toileting Clothing Manipulation and  Hygiene: Sit to stand from 3-in-1 or toilet Equipment Used: Rolling walker;Sock aid;Reacher;Long-handled sponge;Long-handled shoe horn;Back brace Transfers/Ambulation Related to ADLs: S ADL Comments: Educated pt on compensatory techniques for ADL, AE, DME. Educated on AE for  hygiene after toileting.  (independent with donning/doffing brace)    OT Diagnosis:    OT Problem List:   OT Treatment Interventions:     OT Goals(Current goals can be found in the care plan section) Acute Rehab OT Goals Patient Stated Goal: to be as independent as possible before he goes back to work.   OT Goal Formulation:  (eval only)  Visit Information  Last OT Received On: 11/05/12 Assistance Needed: +1 History of Present Illness: 67 y.o. female admitted to [facility] hospital on 11/04/12 for elective lumbar L4/5 decompression and PLIF.         Prior Functioning     Home Living Family/patient expects to be discharged to:: Private residence Living Arrangements: Spouse/significant other Available Help at Discharge: Family;Available 24 hours/day (until husband goes back to work on Tuesday) Type of Home: House Home Access: Stairs to enter [employer] of Steps: 3 Entrance Stairs-Rails: None ("post" to grab onto) Home Layout: One level Home Equipment: Environmental consultant - 2 wheels;Cane - single point;Bedside commode;Shower seat (lift chair) Additional Comments: Educated pt on back precautions, use of AE and DME. PT able to return demonstrate learned ADL tasks. Disussed hygiene after toielting Prior Function Level of Independence: Independent with assistive device(s) Communication Communication: No difficulties         Vision/Perception     Cognition  Cognition Arousal/Alertness: Awake/alert Behavior During Therapy: WFL for tasks assessed/performed Overall Cognitive Status: Within Functional Limits for tasks assessed    Extremity/Trunk Assessment Upper Extremity Assessment Upper Extremity Assessment:  Generalized weakness Lower Extremity Assessment Lower Extremity Assessment: Generalized weakness Cervical /  Trunk Assessment Cervical / Trunk Assessment: Normal     Mobility Bed Mobility Bed Mobility: Not assessed (up in chair) Rolling Left: 4: Min guard Left Sidelying to Sit: 4: Min guard;HOB flat Sitting - Scoot to Edge of Bed: 6: Modified independent (Device/Increase time) Sit to Sidelying Left: 4: Min assist;HOB flat Details for Bed Mobility Assistance: min guard assist for bed mobility and side <-> sit transfers due to bed flat and no rails simulating home environment.  Educated on log roll technique.   Transfers Sit to Stand: 5: Supervision;With upper extremity assist;From bed;From elevated surface Stand to Sit: 5: Supervision;With upper extremity assist;With armrests;To chair/3-in-1;To bed Details for Transfer Assistance: Verbal cues for safe hand placement     Exercise     Balance     End of Session OT - End of Session Equipment Utilized During Treatment: Rolling walker;Back brace Activity Tolerance: Patient tolerated treatment well Patient left: in chair;with call bell/phone within reach;with family/visitor present Nurse Communication: Mobility status;Precautions  GO     Asyah Candler,HILLARY 11/05/2012, 2:30 PM [facility], OTR/L  [phone] 11/05/2012

## 2012-11-05 NOTE — Discharge Summary (Signed)
Physician Discharge Summary  Patient ID: Sally Douglas MRN: [id number] DOB/AGE: [birth year] 67 y.o.  Admit date: 11/04/2012 Discharge date: 11/05/2012  Admission Diagnoses: lumbar stenosis    Discharge Diagnoses: same   Discharged Condition: good  Hospital Course: The patient was admitted on 11/04/2012 and taken to the operating room where the patient underwent PLIF L4-5. The patient tolerated the procedure well and was taken to the recovery room and then to the floor in stable condition. The hospital course was routine. There were no complications. The wound remained clean dry and intact. Pt had appropriate back soreness. No complaints of leg pain or new N/T/W. The patient remained afebrile with stable vital signs, and tolerated a regular diet. The patient continued to increase activities, and pain was well controlled with oral pain medications.   Consults: None  Significant Diagnostic Studies:  Results for orders placed during the hospital encounter of 10/27/12  SURGICAL PCR SCREEN      Result Value Range   MRSA, PCR NEGATIVE  NEGATIVE   Staphylococcus aureus NEGATIVE  NEGATIVE  BASIC METABOLIC PANEL      Result Value Range   Sodium 138  135 - 145 mEq/L   Potassium 3.6  3.5 - 5.1 mEq/L   Chloride 96  96 - 112 mEq/L   CO2 32  19 - 32 mEq/L   Glucose, Bld 84  70 - 99 mg/dL   BUN 19  6 - 23 mg/dL   Creatinine, Ser 4.78  0.50 - 1.10 mg/dL   Calcium 9.6  8.4 - 29.5 mg/dL   GFR calc non Af Amer 56 (*) >90 mL/min   GFR calc Af Amer 65 (*) >90 mL/min  CBC      Result Value Range   WBC 11.9 (*) 4.0 - 10.5 K/uL   RBC 4.57  3.87 - 5.11 MIL/uL   Hemoglobin 13.9  12.0 - 15.0 g/dL   HCT 62.1  30.8 - 65.7 %   MCV 89.7  78.0 - 100.0 fL   MCH 30.4  26.0 - 34.0 pg   MCHC 33.9  30.0 - 36.0 g/dL   RDW 84.6  96.2 - 95.2 %   Platelets 285  150 - 400 K/uL  TYPE AND SCREEN      Result Value Range   ABO/RH(D) B NEG     Antibody Screen NEG     Sample Expiration 11/10/2012    ABO/RH       Result Value Range   ABO/RH(D) B NEG      Dg Chest 2 View  10/27/2012   CLINICAL DATA:  Preop for lumbar spine surgery. Smoker. Hypertension.  EXAM: CHEST  2 VIEW  COMPARISON:  01/16/11  FINDINGS: Midline trachea. Mild cardiomegaly with a tortuous thoracic aorta. No pleural effusion or pneumothorax. Subsegmental atelectasis or scarring at the bilateral lung bases.  IMPRESSION: Mild cardiomegaly, without acute disease.   Electronically Signed   By: Jeronimo Greaves   On: 10/27/2012 11:20   Dg Lumbar Spine 2-3 Views  11/04/2012   *RADIOLOGY REPORT*  Clinical Data: L4 - L5 PLIF  DG C-ARM 1-60 MIN,LUMBAR SPINE - 2-3 VIEW  Comparison:  Lumbar spine MRI - 09/20/2012.  Fluoroscopy time:  57 seconds  Findings:  Two spot intraoperative radiographic images of the lower lumbar spine provided for review.  The lumbar spine labeling is in keeping with preprocedural lumbar spine MRI.  Images demonstrate the sequela of L4 - L5 paraspinal fusion and intervertebral disc space replacement.  There has  been interval restoration of the L4 - L5 intervertebral disc space with suspected improvement in previously noted anterolisthesis of L4 upon L5.  No definite evidence of hardware failure or loosening.  No definite radiopaque foreign body.  IMPRESSION: Post L4 - L5 ACDF without definite evidence of complication.   Original Report Authenticated By: Tacey Ruiz, MD   Dg C-arm 1-60 Min  11/04/2012   *RADIOLOGY REPORT*  Clinical Data: L4 - L5 PLIF  DG C-ARM 1-60 MIN,LUMBAR SPINE - 2-3 VIEW  Comparison:  Lumbar spine MRI - 09/20/2012.  Fluoroscopy time:  57 seconds  Findings:  Two spot intraoperative radiographic images of the lower lumbar spine provided for review.  The lumbar spine labeling is in keeping with preprocedural lumbar spine MRI.  Images demonstrate the sequela of L4 - L5 paraspinal fusion and intervertebral disc space replacement.  There has been interval restoration of the L4 - L5 intervertebral disc space with suspected  improvement in previously noted anterolisthesis of L4 upon L5.  No definite evidence of hardware failure or loosening.  No definite radiopaque foreign body.  IMPRESSION: Post L4 - L5 ACDF without definite evidence of complication.   Original Report Authenticated By: Tacey Ruiz, MD    Antibiotics:  Anti-infectives   Start     Dose/Rate Route Frequency Ordered Stop   11/04/12 1500  ceFAZolin (ANCEF) IVPB 1 g/50 mL premix     1 g 100 mL/hr over 30 Minutes Intravenous Every 8 hours 11/04/12 1450 11/04/12 2326   11/04/12 0922  bacitracin 50,000 Units in sodium chloride irrigation 0.9 % 500 mL irrigation  Status:  Discontinued       As needed 11/04/12 0922 11/04/12 1431   11/04/12 0609  ceFAZolin (ANCEF) 2-3 GM-% IVPB SOLR    Comments:  Leandro Reasoner   : cabinet override      11/04/12 0609 11/04/12 1814   11/04/12 0600  ceFAZolin (ANCEF) IVPB 2 g/50 mL premix     2 g 100 mL/hr over 30 Minutes Intravenous On call to O.R. 11/03/12 1505 11/04/12 0845      Discharge Exam: Blood pressure 186/98, pulse 96, temperature 97.9 F (36.6 C), temperature source Oral, resp. rate 20, weight 86 kg (189 lb 9.5 oz), SpO2 94.00%. Neurologic: Grossly normal incison CDI  Discharge Medications:     Medication List    STOP taking these medications       meloxicam 15 MG tablet  Commonly known as:  MOBIC      TAKE these medications       amitriptyline 25 MG tablet  Commonly known as:  ELAVIL  Take 25 mg by mouth at bedtime.     aspirin 81 MG tablet  Take 81 mg by mouth every morning.     cyclobenzaprine 5 MG tablet  Commonly known as:  FLEXERIL  Take 1 tablet (5 mg total) by mouth 3 (three) times daily as needed for muscle spasms.     diltiazem 360 MG 24 hr capsule  Commonly known as:  TIAZAC  Take 1 capsule (360 mg total) by mouth every morning.     hydrochlorothiazide 25 MG tablet  Commonly known as:  HYDRODIURIL  Take 1 tablet (25 mg total) by mouth every morning.      HYDROcodone-acetaminophen 10-325 MG per tablet  Commonly known as:  NORCO  Take 1 tablet by mouth every 6 (six) hours as needed for pain.     oxyCODONE-acetaminophen 5-325 MG per tablet  Commonly known as:  PERCOCET/ROXICET  Take 1-2 tablets by mouth every 4 (four) hours as needed for pain.     polyethylene glycol powder powder  Commonly known as:  GLYCOLAX/MIRALAX  Take 17 g by mouth daily.     zolpidem 10 MG tablet  Commonly known as:  AMBIEN  Take 10 mg by mouth at bedtime as needed for sleep.        Disposition: home   Final Dx: PLIF L4-5      Discharge Orders   Future Appointments Provider Department Dept Phone   04/29/2013 10:00 AM Gordy Savers, MD [facility] [phone]   Future Orders Complete By Expires   Call MD for:  difficulty breathing, headache or visual disturbances  As directed    Call MD for:  persistant nausea and vomiting  As directed    Call MD for:  redness, tenderness, or signs of infection (pain, swelling, redness, odor or green/yellow discharge around incision site)  As directed    Call MD for:  severe uncontrolled pain  As directed    Call MD for:  temperature >100.4  As directed    Diet - low sodium heart healthy  As directed    Discharge instructions  As directed    Comments:     May shower, No bending or twisting   Increase activity slowly  As directed       Follow-up Information   Follow up with Malkie Wille S, MD. Schedule an appointment as soon as possible for a visit in 2 weeks.   Specialty:  Neurosurgery   Contact information:   [address] [place] [address] [phone]        Signed: Tia Alert 11/05/2012, 4:01 PM

## 2012-11-05 NOTE — Progress Notes (Signed)
Pt doing well. Pt and husband given D/C instructions with Rx's, verbal understanding was given. Pt D/C'd home via wheelchair @ 1730 per MD order. Rema Fendt, RN

## 2012-11-05 NOTE — Progress Notes (Signed)
Patient ID: Sally Douglas, female   DOB: [birth year], 67 y.o.   MRN: [id number] Subjective: Patient reports appropriate back soreness, no leg pain or N/T/W. Walking with PT. Objective: Vital signs in last 24 hours: Temp:  [97.5 F (36.4 C)-99.2 F (37.3 C)] 98.7 F (37.1 C) (10/09 0820) Pulse Rate:  [59-92] 92 (10/09 0820) Resp:  [11-23] 18 (10/09 0820) BP: (132-189)/(48-102) 163/102 mmHg (10/09 0820) SpO2:  [92 %-99 %] 92 % (10/09 0820)  Intake/Output from previous day: 10/08 0701 - 10/09 0700 In: 1400 [I.V.:1400] Out: 2390 [Urine:2110; Drains:130; Blood:150] Intake/Output this shift: Total I/O In: 360 [P.O.:360] Out: -   Neurologic: Grossly normal  Lab Results: Lab Results  Component Value Date   WBC 11.9* 10/27/2012   HGB 13.9 10/27/2012   HCT 41.0 10/27/2012   MCV 89.7 10/27/2012   PLT 285 10/27/2012   No results found for this basename: INR, PROTIME   BMET Lab Results  Component Value Date   NA 138 10/27/2012   K 3.6 10/27/2012   CL 96 10/27/2012   CO2 32 10/27/2012   GLUCOSE 84 10/27/2012   BUN 19 10/27/2012   CREATININE 1.01 10/27/2012   CALCIUM 9.6 10/27/2012    Studies/Results: Dg Lumbar Spine 2-3 Views  11/04/2012   *RADIOLOGY REPORT*  Clinical Data: L4 - L5 PLIF  DG C-ARM 1-60 MIN,LUMBAR SPINE - 2-3 VIEW  Comparison:  Lumbar spine MRI - 09/20/2012.  Fluoroscopy time:  57 seconds  Findings:  Two spot intraoperative radiographic images of the lower lumbar spine provided for review.  The lumbar spine labeling is in keeping with preprocedural lumbar spine MRI.  Images demonstrate the sequela of L4 - L5 paraspinal fusion and intervertebral disc space replacement.  There has been interval restoration of the L4 - L5 intervertebral disc space with suspected improvement in previously noted anterolisthesis of L4 upon L5.  No definite evidence of hardware failure or loosening.  No definite radiopaque foreign body.  IMPRESSION: Post L4 - L5 ACDF without definite evidence of  complication.   Original Report Authenticated By: Tacey Ruiz, MD   Dg C-arm 1-60 Min  11/04/2012   *RADIOLOGY REPORT*  Clinical Data: L4 - L5 PLIF  DG C-ARM 1-60 MIN,LUMBAR SPINE - 2-3 VIEW  Comparison:  Lumbar spine MRI - 09/20/2012.  Fluoroscopy time:  57 seconds  Findings:  Two spot intraoperative radiographic images of the lower lumbar spine provided for review.  The lumbar spine labeling is in keeping with preprocedural lumbar spine MRI.  Images demonstrate the sequela of L4 - L5 paraspinal fusion and intervertebral disc space replacement.  There has been interval restoration of the L4 - L5 intervertebral disc space with suspected improvement in previously noted anterolisthesis of L4 upon L5.  No definite evidence of hardware failure or loosening.  No definite radiopaque foreign body.  IMPRESSION: Post L4 - L5 ACDF without definite evidence of complication.   Original Report Authenticated By: Tacey Ruiz, MD    Assessment/Plan: Doing well. Home tomorrow?   LOS: 1 day    [name] S 11/05/2012, 10:39 AM

## 2012-11-05 NOTE — Evaluation (Signed)
Physical Therapy Evaluation Patient Details Name: Sally Douglas MRN: [id number] DOB: 30-May-1945 Today's Date: 11/05/2012 Time: 1010-1052 PT Time Calculation (min): 42 min  PT Assessment / Plan / Recommendation History of Present Illness  67 y.o. female admitted to [facility] hospital on 11/04/12 for elective lumbar L4/5 decompression and PLIF.    Clinical Impression  Pt is POD #1 s/p lumbar spine fusion.  She has quite a few questions about how to move and what not to do at home.  Education presented re: back precautions and pt demonstrated gait and stairs with RW and husband's assist.  If pt is here tomorrow we will reinforce education presented.  Otherwise, she is safe to go home with husband's assist as MD deems appropriate.   PT to follow acutely for deficits listed below.       PT Assessment  Patient needs continued PT services    Follow Up Recommendations  No PT follow up;Supervision for mobility/OOB    Does the patient have the potential to tolerate intense rehabilitation     NA  Barriers to Discharge   None      Equipment Recommendations  None recommended by PT    Recommendations for Other Services   None  Frequency Min 5X/week    Precautions / Restrictions Precautions Precautions: Back Precaution Booklet Issued: Yes (comment) Precaution Comments: handout given and back precautions reviewed with pt/husband Required Braces or Orthoses: Spinal Brace Spinal Brace: Lumbar corset;Applied in sitting position Restrictions Weight Bearing Restrictions: No   Pertinent Vitals/Pain See vitals flow sheet.       Mobility  Bed Mobility Bed Mobility: Rolling Left;Left Sidelying to Sit;Sitting - Scoot to [employer] of Bed;Sit to Sidelying Left Rolling Left: 4: Min guard Left Sidelying to Sit: 4: Min guard;HOB flat Sitting - Scoot to [employer] of Bed: 6: Modified independent (Device/Increase time) Sit to Sidelying Left: 4: Min assist;HOB flat Details for Bed Mobility Assistance: min guard assist  for bed mobility and side <-> sit transfers due to bed flat and no rails simulating home environment.  Educated on log roll technique.   Transfers Transfers: Sit to Stand;Stand to [employer] Transfers Sit to Stand: 5: Supervision;With upper extremity assist;From bed;From elevated surface Stand to Sit: 5: Supervision;With upper extremity assist;With armrests;To chair/3-in-1;To bed Stand Pivot Transfers: 5: Supervision;From elevated surface;With armrests Details for Transfer Assistance: Verbal cues for safe hand placement Ambulation/Gait Ambulation/Gait Assistance: 5: Supervision Ambulation Distance (Feet): 200 Feet Assistive device: Rolling walker Ambulation/Gait Assistance Details: verbal cues for safe RW use, for activity at home.  Verbal cues for not twisting when turning  Gait Pattern: Step-through pattern;Shuffle Gait velocity: decreased Stairs: Yes Stairs Assistance: 4: Min assist Stairs Assistance Details (indicate cue type and reason): min hand held assist by husband for support due to only having one "post" to hold to at home Stair Management Technique: One rail Right;Forwards;Step to pattern (holding husband's hand on the left side) Number of Stairs: 3        PT Diagnosis: Difficulty walking;Abnormality of gait;Generalized weakness;Acute pain  PT Problem List: Decreased strength;Decreased balance;Decreased activity tolerance;Decreased mobility;Decreased knowledge of use of DME;Decreased knowledge of precautions;Pain PT Treatment Interventions: Gait training;DME instruction;Stair training;Functional mobility training;Therapeutic activities;Therapeutic exercise;Balance training;Neuromuscular re-education;Patient/family education;Modalities     PT Goals(Current goals can be found in the care plan section) Acute Rehab PT Goals Patient Stated Goal: to be as independent as possible before he goes back to work.   PT Goal Formulation: With patient/family Time For Goal  Achievement: 11/12/12 Potential to  Achieve Goals: Good  Visit Information  Last PT Received On: 11/05/12 Assistance Needed: +1 History of Present Illness: 67 y.o. female admitted to [facility] hospital on 11/04/12 for elective lumbar L4/5 decompression and PLIF.         Prior Functioning  Home Living Family/patient expects to be discharged to:: Private residence Living Arrangements: Spouse/significant other Available Help at Discharge: Family;Available 24 hours/day (until husband goes back to work on Tuesday) Type of Home: House Home Access: Stairs to enter [employer] of Steps: 3 Entrance Stairs-Rails: None ("post" to grab onto) Home Layout: One level Home Equipment: Environmental consultant - 2 wheels;Cane - single point;Bedside commode;Shower seat (lift chair) Prior Function Level of Independence: Independent with assistive device(s) Communication Communication: No difficulties    Cognition  Cognition Arousal/Alertness: Awake/alert Behavior During Therapy: WFL for tasks assessed/performed Overall Cognitive Status: Within Functional Limits for tasks assessed    Extremity/Trunk Assessment Upper Extremity Assessment Upper Extremity Assessment: Defer to OT evaluation Lower Extremity Assessment Lower Extremity Assessment: Generalized weakness Cervical / Trunk Assessment Cervical / Trunk Assessment: Normal      End of Session PT - End of Session Equipment Utilized During Treatment: Back brace Activity Tolerance: Patient tolerated treatment well Patient left: in chair;with call bell/phone within reach;with family/visitor present Nurse Communication: Mobility status    Lurena Joiner B. Mariea Mcmartin, PT, DPT [phone]   11/05/2012, 11:15 AM

## 2012-11-11 ENCOUNTER — Encounter (HOSPITAL_COMMUNITY): Payer: Self-pay | Admitting: Neurological Surgery

## 2012-11-11 ENCOUNTER — Encounter: Payer: Medicare Other | Admitting: Internal Medicine

## 2012-11-11 ENCOUNTER — Other Ambulatory Visit: Payer: Self-pay | Admitting: Internal Medicine

## 2012-12-14 DIAGNOSIS — M48061 Spinal stenosis, lumbar region without neurogenic claudication: Secondary | ICD-10-CM | POA: Diagnosis not present

## 2012-12-14 DIAGNOSIS — Z09 Encounter for follow-up examination after completed treatment for conditions other than malignant neoplasm: Secondary | ICD-10-CM | POA: Diagnosis not present

## 2013-02-11 ENCOUNTER — Other Ambulatory Visit: Payer: Self-pay | Admitting: Internal Medicine

## 2013-02-15 DIAGNOSIS — I10 Essential (primary) hypertension: Secondary | ICD-10-CM | POA: Diagnosis not present

## 2013-02-15 DIAGNOSIS — M48061 Spinal stenosis, lumbar region without neurogenic claudication: Secondary | ICD-10-CM | POA: Diagnosis not present

## 2013-02-18 DIAGNOSIS — M76899 Other specified enthesopathies of unspecified lower limb, excluding foot: Secondary | ICD-10-CM | POA: Diagnosis not present

## 2013-02-18 DIAGNOSIS — E669 Obesity, unspecified: Secondary | ICD-10-CM | POA: Diagnosis not present

## 2013-02-18 DIAGNOSIS — I10 Essential (primary) hypertension: Secondary | ICD-10-CM | POA: Diagnosis not present

## 2013-02-19 ENCOUNTER — Other Ambulatory Visit: Payer: Self-pay | Admitting: Internal Medicine

## 2013-03-30 ENCOUNTER — Other Ambulatory Visit: Payer: Self-pay | Admitting: Internal Medicine

## 2013-04-19 ENCOUNTER — Ambulatory Visit (INDEPENDENT_AMBULATORY_CARE_PROVIDER_SITE_OTHER): Payer: Medicare Other | Admitting: Internal Medicine

## 2013-04-19 ENCOUNTER — Encounter: Payer: Self-pay | Admitting: Internal Medicine

## 2013-04-19 ENCOUNTER — Other Ambulatory Visit: Payer: Self-pay | Admitting: Internal Medicine

## 2013-04-19 ENCOUNTER — Telehealth: Payer: Self-pay | Admitting: *Deleted

## 2013-04-19 VITALS — BP 156/90 | HR 84 | Temp 98.5°F | Resp 20 | Ht <= 58 in | Wt 189.0 lb

## 2013-04-19 DIAGNOSIS — I10 Essential (primary) hypertension: Secondary | ICD-10-CM

## 2013-04-19 DIAGNOSIS — F17200 Nicotine dependence, unspecified, uncomplicated: Secondary | ICD-10-CM

## 2013-04-19 LAB — TSH: TSH: 0.36 u[IU]/mL (ref 0.35–5.50)

## 2013-04-19 MED ORDER — ZOLPIDEM TARTRATE 10 MG PO TABS: ORAL_TABLET | ORAL | Status: DC

## 2013-04-19 MED ORDER — HYDROCHLOROTHIAZIDE 25 MG PO TABS: 25.0000 mg | ORAL_TABLET | ORAL | Status: DC

## 2013-04-19 MED ORDER — DILTIAZEM HCL ER BEADS 360 MG PO CP24: 360.0000 mg | ORAL_CAPSULE | ORAL | Status: DC

## 2013-04-19 MED ORDER — AMITRIPTYLINE HCL 25 MG PO TABS: ORAL_TABLET | ORAL | Status: DC

## 2013-04-19 MED ORDER — POTASSIUM CHLORIDE CRYS ER 20 MEQ PO TBCR: 20.0000 meq | EXTENDED_RELEASE_TABLET | Freq: Two times a day (BID) | ORAL | Status: DC

## 2013-04-19 MED ORDER — MELOXICAM 15 MG PO TABS: ORAL_TABLET | ORAL | Status: DC

## 2013-04-19 NOTE — Progress Notes (Signed)
Subjective:    Patient ID: Sally Douglas, female    DOB: 1945/12/11, 68 y.o.   MRN: [id number]  HPI  68 year old patient who is in today for followup.  She complains of cough present for about 4 weeks.  Initially, cough was productive, but now it is dry and nonproductive.  She complains some facial swelling and occasional cramps.  She has treated hypertension and has been on diuretic therapy. She is status post laminectomy for spinal stenosis in October.  Still has back pain that has improved.  She does have hypertension and ongoing tobacco use.  Past Medical History  Diagnosis Date  . Insomnia     takes Ambien nightly  . Obesity   . Menopausal syndrome   . Tobacco abuse   . Low back pain   . Bronchitis     hx of-many yrs ago  . Abnormal finding on Pap smear     hx abnl pap  . Hypertension     takes Diltiazem and HCTZ daily  . Constipation     takes Miralax daily  . Arthritis   . Joint pain   . Joint swelling     History   Social History  . Marital Status: Married    Spouse Name: N/A    Number of Children: N/A  . Years of Education: N/A   Occupational History  . Not on file.   Social History Main Topics  . Smoking status: Current Every Day Smoker -- 0.50 packs/day for 25 years    Types: Cigarettes  . Smokeless tobacco: Never Used  . Alcohol Use: No     Comment: occassional  . Drug Use: No  . Sexual Activity: Not Currently    Birth Control/ Protection: Post-menopausal   Other Topics Concern  . Not on file   Social History Narrative   gavidia 1   Para 1    aborta 0    Past Surgical History  Procedure Laterality Date  . Tonsillectomy    . Dilation and curettage of uterus    . Ventral hernia repair  01/24/2011    Procedure: LAPAROSCOPIC VENTRAL HERNIA;  Surgeon: Adin Hector, MD;  Location: [facility];  Service: General;  Laterality: N/A;  with Mesh  . Maximum access (mas)posterior lumbar interbody fusion (plif) 1 level N/A 11/04/2012    Procedure: Lumbar  four-five Maximum access posterior lumbar interbody fusion with Nuvasive;  Surgeon: Eustace Moore, MD;  Location: [place] [facility];  Service: Neurosurgery;  Laterality: N/A;  Lumbar four-five Maximum access posterior lumbar interbody fusion with Nuvasive    Family History  Problem Relation Age of Onset  . Cancer Brother     renal ,also sister  . COPD Brother   . Hypertension Brother   . Diabetes Brother   . Kidney disease Brother   . Diabetes Brother     and sister  . Breast cancer      sister  . Kidney disease    . Cancer Mother     Breast Cancer  . Diabetes Mother   . Hypertension Mother   . Glaucoma Mother   . Diabetes Sister   . Hypertension Sister   . Heart disease Sister     No Known Allergies  Current Outpatient Prescriptions on File Prior to Visit  Medication Sig Dispense Refill  . aspirin 81 MG tablet Take 81 mg by mouth every morning.       . polyethylene glycol powder (GLYCOLAX/MIRALAX) powder Take 17 g  by mouth daily.       No current facility-administered medications on file prior to visit.    BP 156/90  Pulse 84  Temp(Src) 98.5 F (36.9 C) (Oral)  Resp 20  Ht 4' 8.75" (1.441 m)  Wt 189 lb (85.73 kg)  BMI 41.29 kg/m2  SpO2 97%       Review of Systems  Constitutional: Positive for activity change, appetite change and fatigue.  HENT: Negative for congestion, dental problem, hearing loss, rhinorrhea, sinus pressure, sore throat and tinnitus.   Eyes: Negative for pain, discharge and visual disturbance.  Respiratory: Positive for cough. Negative for shortness of breath.   Cardiovascular: Negative for chest pain, palpitations and leg swelling.  Gastrointestinal: Negative for nausea, vomiting, abdominal pain, diarrhea, constipation, blood in stool and abdominal distention.  Genitourinary: Negative for dysuria, urgency, frequency, hematuria, flank pain, vaginal bleeding, vaginal discharge, difficulty urinating, vaginal pain and pelvic pain.    Musculoskeletal: Positive for back pain. Negative for arthralgias, gait problem and joint swelling.  Skin: Negative for rash.  Neurological: Negative for dizziness, syncope, speech difficulty, weakness, numbness and headaches.  Hematological: Negative for adenopathy.  Psychiatric/Behavioral: Negative for behavioral problems, dysphoric mood and agitation. The patient is not nervous/anxious.        Objective:   Physical Exam  Constitutional: She is oriented to person, place, and time. She appears well-developed and well-nourished.  Obese  Suggestion of facial puffiness Blood pressure 150/90 Afebrile  HENT:  Head: Normocephalic.  Right Ear: External ear normal.  Left Ear: External ear normal.  Mouth/Throat: Oropharynx is clear and moist.  Eyes: Conjunctivae and EOM are normal. Pupils are equal, round, and reactive to light.  Neck: Normal range of motion. Neck supple. No thyromegaly present.  Cardiovascular: Normal rate, regular rhythm, normal heart sounds and intact distal pulses.   Pulmonary/Chest: Effort normal and breath sounds normal. No respiratory distress. She has no wheezes. She has no rales.  Abdominal: Soft. Bowel sounds are normal. She exhibits no mass. There is no tenderness.  Musculoskeletal: Normal range of motion.  Lymphadenopathy:    She has no cervical adenopathy.  Neurological: She is alert and oriented to person, place, and time.  Skin: Skin is warm and dry. No rash noted.  Psychiatric: She has a normal mood and affect. Her behavior is normal.          Assessment & Plan:   Resolving URI Ongoing tobacco use.  Total smoking cessation encouraged Back pain.  Patient is on 3 times a day, hydrocodone Hypertension. Cramps.  We'll add the potassium supplement.  And a

## 2013-04-19 NOTE — Progress Notes (Signed)
Pre-visit discussion using our clinic review tool. No additional management support is needed unless otherwise documented below in the visit note.  

## 2013-04-19 NOTE — Patient Instructions (Signed)
Acute bronchitis symptoms  are generally not helped by antibiotics.  Take over-the-counter expectorants and cough medications such as  Mucinex DM.  Call if there is no improvement in 5 to 7 days or if he developed worsening cough, fever, or new symptoms, such as shortness of breath or chest pain.  Smoking tobacco is very bad for your health. You should stop smoking immediately.

## 2013-04-20 ENCOUNTER — Telehealth: Payer: Self-pay | Admitting: Internal Medicine

## 2013-04-20 NOTE — Telephone Encounter (Signed)
Pt did not have a date for rov. Pls advise when she needs to come back

## 2013-04-20 NOTE — Telephone Encounter (Signed)
Relevant patient education assigned to patient using Emmi. ° °

## 2013-04-20 NOTE — Telephone Encounter (Signed)
Juliann Pulse, please schedule sometime this week or next. Thanks

## 2013-04-22 NOTE — Telephone Encounter (Signed)
Pt is coming in Monday, pt asked if her lab report was back.  Pt states she is not much better and nothing much has changed.

## 2013-04-22 NOTE — Telephone Encounter (Signed)
Please see message from Dr. Raliegh Ip and cancel appointment on Monday unless pt wants to keep due to not feeling better.

## 2013-04-22 NOTE — Telephone Encounter (Signed)
Please advise and when does pt need to return for next visit?

## 2013-04-22 NOTE — Telephone Encounter (Signed)
Routine in 6 months or as needed

## 2013-04-26 ENCOUNTER — Ambulatory Visit (INDEPENDENT_AMBULATORY_CARE_PROVIDER_SITE_OTHER): Payer: Medicare Other | Admitting: Internal Medicine

## 2013-04-26 ENCOUNTER — Telehealth: Payer: Self-pay | Admitting: *Deleted

## 2013-04-26 ENCOUNTER — Encounter: Payer: Self-pay | Admitting: Internal Medicine

## 2013-04-26 VITALS — BP 160/90 | HR 91 | Resp 20 | Ht <= 58 in | Wt 189.0 lb

## 2013-04-26 DIAGNOSIS — I10 Essential (primary) hypertension: Secondary | ICD-10-CM | POA: Diagnosis not present

## 2013-04-26 DIAGNOSIS — F17200 Nicotine dependence, unspecified, uncomplicated: Secondary | ICD-10-CM | POA: Diagnosis not present

## 2013-04-26 DIAGNOSIS — B370 Candidal stomatitis: Secondary | ICD-10-CM | POA: Diagnosis not present

## 2013-04-26 MED ORDER — LISINOPRIL 20 MG PO TABS: 20.0000 mg | ORAL_TABLET | Freq: Every day | ORAL | Status: DC

## 2013-04-26 MED ORDER — MAGIC MOUTHWASH: 5.0000 mL | Freq: Four times a day (QID) | ORAL | Status: DC

## 2013-04-26 MED ORDER — FIRST-DUKES MOUTHWASH MT SUSP: 5.0000 mL | Freq: Four times a day (QID) | OROMUCOSAL | Status: DC

## 2013-04-26 NOTE — Telephone Encounter (Signed)
Called pharmacy back spoke to [place] the pharmacist told him [facility] Magic Mouthwash if fine per Dr. Theotis Barrio verbalized understanding.

## 2013-04-26 NOTE — Telephone Encounter (Signed)
Discussed with Dr. Raliegh Ip, he said [facility] Magic Mouthwash is fine does not need Maalox or Mylanta in.

## 2013-04-26 NOTE — Progress Notes (Signed)
Pre-visit discussion using our clinic review tool. No additional management support is needed unless otherwise documented below in the visit note.  

## 2013-04-26 NOTE — Progress Notes (Signed)
Subjective:    Patient ID: Sally Douglas, female    DOB: Oct 14, 1945, 68 y.o.   MRN: [id number]  HPI  68 year old patient who has hypertension.  She was seen 7 days ago with refractory cough, as well as cramping.  Cough is improved modestly, but she still continues to smoke some.  She still is bothered with crampiness by the potassium supplements.  She remains on diuretic therapy.  No fever. Past Medical History  Diagnosis Date  . Insomnia     takes Ambien nightly  . Obesity   . Menopausal syndrome   . Tobacco abuse   . Low back pain   . Bronchitis     hx of-many yrs ago  . Abnormal finding on Pap smear     hx abnl pap  . Hypertension     takes Diltiazem and HCTZ daily  . Constipation     takes Miralax daily  . Arthritis   . Joint pain   . Joint swelling     History   Social History  . Marital Status: Married    Spouse Name: N/A    Number of Children: N/A  . Years of Education: N/A   Occupational History  . Not on file.   Social History Main Topics  . Smoking status: Current Every Day Smoker -- 0.50 packs/day for 25 years    Types: Cigarettes  . Smokeless tobacco: Never Used  . Alcohol Use: No     Comment: occassional  . Drug Use: No  . Sexual Activity: Not Currently    Birth Control/ Protection: Post-menopausal   Other Topics Concern  . Not on file   Social History Narrative   gavidia 1   Para 1    aborta 0    Past Surgical History  Procedure Laterality Date  . Tonsillectomy    . Dilation and curettage of uterus    . Ventral hernia repair  01/24/2011    Procedure: LAPAROSCOPIC VENTRAL HERNIA;  Surgeon: Adin Hector, MD;  Location: [facility];  Service: General;  Laterality: N/A;  with Mesh  . Maximum access (mas)posterior lumbar interbody fusion (plif) 1 level N/A 11/04/2012    Procedure: Lumbar four-five Maximum access posterior lumbar interbody fusion with Nuvasive;  Surgeon: Eustace Moore, MD;  Location: [place] [facility];  Service: Neurosurgery;   Laterality: N/A;  Lumbar four-five Maximum access posterior lumbar interbody fusion with Nuvasive    Family History  Problem Relation Age of Onset  . Cancer Brother     renal ,also sister  . COPD Brother   . Hypertension Brother   . Diabetes Brother   . Kidney disease Brother   . Diabetes Brother     and sister  . Breast cancer      sister  . Kidney disease    . Cancer Mother     Breast Cancer  . Diabetes Mother   . Hypertension Mother   . Glaucoma Mother   . Diabetes Sister   . Hypertension Sister   . Heart disease Sister     No Known Allergies  Current Outpatient Prescriptions on File Prior to Visit  Medication Sig Dispense Refill  . amitriptyline (ELAVIL) 25 MG tablet TAKE 1 TO 2 TABLETS BY MOUTH TO CONTROL PAIN BUT NOT GET TOO SLEEPY  270 tablet  1  . aspirin 81 MG tablet Take 81 mg by mouth every morning.       . diltiazem (TIAZAC) 360 MG 24 hr capsule Take  1 capsule (360 mg total) by mouth every morning.  90 capsule  3  . hydrochlorothiazide (HYDRODIURIL) 25 MG tablet Take 1 tablet (25 mg total) by mouth every morning.  90 tablet  3  . HYDROcodone-acetaminophen (NORCO) 7.5-325 MG per tablet Take 1 tablet by mouth 3 (three) times daily as needed.       . meloxicam (MOBIC) 15 MG tablet TAKE 1 TABLET BY MOUTH EVERY DAY  90 tablet  1  . polyethylene glycol powder (GLYCOLAX/MIRALAX) powder Take 17 g by mouth daily.      . potassium chloride SA (K-DUR,KLOR-CON) 20 MEQ tablet TAKE 1 TABLET BY MOUTH TWICE DAILY  180 tablet  3  . zolpidem (AMBIEN) 10 MG tablet TAKE 1 TABLET BY MOUTH EVERY NIGHT AT BEDTIME AS NEEDED FOR SLEEP  30 tablet  5   No current facility-administered medications on file prior to visit.    BP 160/90  Pulse 91  Resp 20  Ht 4' 8.75" (1.441 m)  Wt 189 lb (85.73 kg)  BMI 41.29 kg/m2  SpO2 98%         Review of Systems  Constitutional: Negative.   HENT: Negative for congestion, dental problem, hearing loss, rhinorrhea, sinus pressure, sore  throat and tinnitus.   Eyes: Negative for pain, discharge and visual disturbance.  Respiratory: Positive for cough. Negative for shortness of breath.   Cardiovascular: Negative for chest pain, palpitations and leg swelling.  Gastrointestinal: Negative for nausea, vomiting, abdominal pain, diarrhea, constipation, blood in stool and abdominal distention.  Genitourinary: Negative for dysuria, urgency, frequency, hematuria, flank pain, vaginal bleeding, vaginal discharge, difficulty urinating, vaginal pain and pelvic pain.  Musculoskeletal: Positive for myalgias. Negative for arthralgias, gait problem and joint swelling.  Skin: Negative for rash.  Neurological: Negative for dizziness, syncope, speech difficulty, weakness, numbness and headaches.  Hematological: Negative for adenopathy.  Psychiatric/Behavioral: Negative for behavioral problems, dysphoric mood and agitation. The patient is not nervous/anxious.        Objective:   Physical Exam  Constitutional: She is oriented to person, place, and time. She appears well-developed and well-nourished.  HENT:  Head: Normocephalic.  Right Ear: External ear normal.  Left Ear: External ear normal.  Scattered white, exudative lesions, consistent with thrush  Eyes: Conjunctivae and EOM are normal. Pupils are equal, round, and reactive to light.  Neck: Normal range of motion. Neck supple. No thyromegaly present.  Cardiovascular: Normal rate, regular rhythm, normal heart sounds and intact distal pulses.   Pulmonary/Chest: Effort normal and breath sounds normal. No respiratory distress. She has no wheezes. She has no rales.  Abdominal: Soft. Bowel sounds are normal. She exhibits no mass. There is no tenderness.  Musculoskeletal: Normal range of motion.  Lymphadenopathy:    She has no cervical adenopathy.  Neurological: She is alert and oriented to person, place, and time.  Skin: Skin is warm and dry. No rash noted.  Psychiatric: She has a normal mood  and affect. Her behavior is normal.          Assessment & Plan:  Hypertension. Persistent cramping inside of potassium supplementation.  We'll discontinue HCTZ and substitute lisinopril Resolving URI.  Slightly improved.  Total smoking cessation encouraged  CPX October

## 2013-04-26 NOTE — Telephone Encounter (Signed)
Spoke to pharmacist needs to know if wants [facility] Magic Mouthwash that does not have maalox or mylanta in, or needs to know who much of the ingredients? Told him I will check and call back.

## 2013-04-26 NOTE — Telephone Encounter (Signed)
Pharmacy calling for clarity of prescription for magic mouth wash.

## 2013-04-26 NOTE — Patient Instructions (Addendum)
Limit your sodium (Salt) intake  Smoking tobacco is very bad for your health. You should stop smoking immediately.  Please check your blood pressure on a regular basis.  If it is consistently greater than 150/90, please make an office appointment.  Return in October for your annual examThrush, Adult  Ritta Slot, also called oral candidiasis, is a fungal infection that develops in the mouth and throat and on the tongue. It causes white patches to form on the mouth and tongue. Ritta Slot is most common in older adults, but it can occur at any age.  Many cases of thrush are mild, but this infection can also be more serious. Ritta Slot can be a recurring problem for people who have chronic illnesses or who take medicines that limit the body's ability to fight infection. Because these people have difficulty fighting infections, the fungus that causes thrush can spread throughout the body. This can cause life-threatening blood or organ infections. CAUSES  Ritta Slot is usually caused by a yeast called Candida albicans. This fungus is normally present in small amounts in the mouth and on other mucous membranes. It usually causes no harm. However, when conditions are present that allow the fungus to grow uncontrolled, it invades surrounding tissues and becomes an infection. Less often, other Candida species can also lead to thrush.  RISK FACTORS Ritta Slot is more likely to develop in the following people:  People with an impaired ability to fight infection (weakened immune system).   Older adults.   People with HIV.   People with diabetes.   People with dry mouth (xerostomia).   Pregnant women.   People with poor dental care, especially those who have false teeth.   People who use antibiotic medicines.  SIGNS AND SYMPTOMS  Ritta Slot can be a mild infection that causes no symptoms. If symptoms develop, they may include:   A burning feeling in the mouth and throat. This can occur at the start of a thrush  infection.   White patches that adhere to the mouth and tongue. The tissue around the patches may be red, raw, and painful. If rubbed (during tooth brushing, for example), the patches and the tissue of the mouth may bleed easily.   A bad taste in the mouth or difficulty tasting foods.   Cottony feeling in the mouth.   Pain during eating and swallowing. DIAGNOSIS  Your health care provider can usually diagnose thrush by looking in your mouth and asking you questions about your health.  TREATMENT  Medicines that help prevent the growth of fungi (antifungals) are the standard treatment for thrush. These medicines are either applied directly to the affected area (topical) or swallowed (oral). The treatment will depend on the severity of the condition.  Mild Thrush Mild cases of thrush may clear up with the use of an antifungal mouth rinse or lozenges. Treatment usually lasts about 14 days.  Moderate to Severe Thrush  More severe thrush infections that have spread to the esophagus are treated with an oral antifungal medicine. A topical antifungal medicine may also be used.   For some severe infections, a treatment period longer than 14 days may be needed.   Oral antifungal medicines are almost never used during pregnancy because the fetus may be harmed. However, if a pregnant woman has a rare, severe thrush infection that has spread to her blood, oral antifungal medicines may be used. In this case, the risk of harm to the mother and fetus from the severe thrush infection may be greater than  the risk posed by the use of antifungal medicines.  Persistent or Recurrent Thrush For cases of thrush that do not go away or keep coming back, treatment may involve the following:   Treatment may be needed twice as long as the symptoms last.   Treatment will include both oral and topical antifungal medicines.   People with weakened immune systems can take an antifungal medicine on a continuous  basis to prevent thrush infections.  It is important to treat conditions that make you more likely to get thrush, such as diabetes or HIV.  HOME CARE INSTRUCTIONS   Only take over-the-counter or prescription medicine as directed by your health care provider. Talk to your health care provider about an over-the-counter medicine called gentian violet, which kills bacteria and fungi.   Eat plain, unflavored yogurt as directed by your health care provider. Check the label to make sure the yogurt contains live cultures. This yogurt can help healthy bacteria grow in the mouth that can stop the growth of the fungus that causes thrush.   Try these measures to help reduce the discomfort of thrush:   Drink cold liquids such as water or iced tea.   Try flavored ice treats or frozen juices.   Eat foods that are easy to swallow, such as gelatin, ice cream, or custard.   If the patches in your mouth are painful, try drinking from a straw.   Rinse your mouth several times a day with a warm saltwater rinse. You can make the saltwater mixture with 1 tsp (6 g) of salt in 8 fl oz (0.2 L) of warm water.   If you wear dentures, remove the dentures before going to bed, brush them vigorously, and soak them in a cleaning solution as directed by your health care provider.   Women who are breastfeeding should clean their nipples with an antifungal medicine as directed by their health care provider. Dry the nipples after breastfeeding. Applying lanolin-containing body lotion may help relieve nipple soreness.  SEEK MEDICAL CARE IF:  Your symptoms are getting worse or are not improving within 7 days of starting treatment.   You have symptoms of spreading infection, such as white patches on the skin outside of the mouth.   You are nursing and you have redness, burning, or pain in the nipples that is not relieved with treatment.  MAKE SURE YOU:  Understand these instructions.  Will watch your  condition.  Will get help right away if you are not doing well or get worse. Document Released: 10/10/2003 Document Revised: 11/04/2012 Document Reviewed: 08/17/2012 [facility] Patient Information 2014 [place]. Muscle Cramps and Spasms Muscle cramps and spasms occur when a muscle or muscles tighten and you have no control over this tightening (involuntary muscle contraction). They are a common problem and can develop in any muscle. The most common place is in the calf muscles of the leg. Both muscle cramps and muscle spasms are involuntary muscle contractions, but they also have differences:   Muscle cramps are sporadic and painful. They may last a few seconds to a quarter of an hour. Muscle cramps are often more forceful and last longer than muscle spasms.  Muscle spasms may or may not be painful. They may also last just a few seconds or much longer. CAUSES  It is uncommon for cramps or spasms to be due to a serious underlying problem. In many cases, the cause of cramps or spasms is unknown. Some common causes are:   Overexertion.  Overuse from repetitive motions (doing the same thing over and over).   Remaining in a certain position for a long period of time.   Improper preparation, form, or technique while performing a sport or activity.   Dehydration.   Injury.   Side effects of some medicines.   Abnormally low levels of the salts and ions in your blood (electrolytes), especially potassium and calcium. This could happen if you are taking water pills (diuretics) or you are pregnant.  Some underlying medical problems can make it more likely to develop cramps or spasms. These include, but are not limited to:   Diabetes.   Parkinson disease.   Hormone disorders, such as thyroid problems.   Alcohol abuse.   Diseases specific to muscles, joints, and bones.   Blood vessel disease where not enough blood is getting to the muscles.  HOME CARE INSTRUCTIONS    Stay well hydrated. Drink enough water and fluids to keep your urine clear or pale yellow.  It may be helpful to massage, stretch, and relax the affected muscle.  For tight or tense muscles, use a warm towel, heating pad, or hot shower water directed to the affected area.  If you are sore or have pain after a cramp or spasm, applying ice to the affected area may relieve discomfort.  Put ice in a plastic bag.  Place a towel between your skin and the bag.  Leave the ice on for 15-20 minutes, 03-04 times a day.  Medicines used to treat a known cause of cramps or spasms may help reduce their frequency or severity. Only take over-the-counter or prescription medicines as directed by your caregiver. SEEK MEDICAL CARE IF:  Your cramps or spasms get more severe, more frequent, or do not improve over time.  MAKE SURE YOU:   Understand these instructions.  Will watch your condition.  Will get help right away if you are not doing well or get worse. Document Released: 07/06/2001 Document Revised: 05/11/2012 Document Reviewed: 01/01/2012 [facility] Patient Information 2014 [place], [place].

## 2013-04-27 ENCOUNTER — Telehealth: Payer: Self-pay | Admitting: Internal Medicine

## 2013-04-27 NOTE — Telephone Encounter (Signed)
Relevant patient education assigned to patient using Emmi. ° °

## 2013-04-29 ENCOUNTER — Ambulatory Visit: Payer: Medicare Other | Admitting: Internal Medicine

## 2013-05-17 DIAGNOSIS — M48061 Spinal stenosis, lumbar region without neurogenic claudication: Secondary | ICD-10-CM | POA: Diagnosis not present

## 2013-06-02 DIAGNOSIS — M545 Low back pain, unspecified: Secondary | ICD-10-CM | POA: Diagnosis not present

## 2013-06-02 DIAGNOSIS — Z6841 Body Mass Index (BMI) 40.0 and over, adult: Secondary | ICD-10-CM | POA: Diagnosis not present

## 2013-06-02 DIAGNOSIS — M549 Dorsalgia, unspecified: Secondary | ICD-10-CM | POA: Diagnosis not present

## 2013-07-12 ENCOUNTER — Other Ambulatory Visit: Payer: Self-pay | Admitting: Internal Medicine

## 2013-07-15 ENCOUNTER — Other Ambulatory Visit: Payer: Self-pay | Admitting: Internal Medicine

## 2013-08-17 ENCOUNTER — Other Ambulatory Visit: Payer: Self-pay | Admitting: Neurological Surgery

## 2013-08-17 DIAGNOSIS — I10 Essential (primary) hypertension: Secondary | ICD-10-CM | POA: Diagnosis not present

## 2013-08-17 DIAGNOSIS — M545 Low back pain, unspecified: Secondary | ICD-10-CM

## 2013-08-17 DIAGNOSIS — Z6839 Body mass index (BMI) 39.0-39.9, adult: Secondary | ICD-10-CM | POA: Diagnosis not present

## 2013-08-18 DIAGNOSIS — M545 Low back pain, unspecified: Secondary | ICD-10-CM | POA: Diagnosis not present

## 2013-08-18 DIAGNOSIS — M48061 Spinal stenosis, lumbar region without neurogenic claudication: Secondary | ICD-10-CM | POA: Diagnosis not present

## 2013-08-26 ENCOUNTER — Ambulatory Visit
Admission: RE | Admit: 2013-08-26 | Discharge: 2013-08-26 | Disposition: A | Discharge status: Home / Self Care | Payer: Medicare Other | Source: Ambulatory Visit | Attending: Neurological Surgery | Admitting: Neurological Surgery

## 2013-08-26 DIAGNOSIS — M48061 Spinal stenosis, lumbar region without neurogenic claudication: Secondary | ICD-10-CM | POA: Diagnosis not present

## 2013-08-26 DIAGNOSIS — M545 Low back pain, unspecified: Secondary | ICD-10-CM

## 2013-08-26 MED ORDER — GADOBENATE DIMEGLUMINE 529 MG/ML IV SOLN
8.0000 mL | Freq: Once | INTRAVENOUS | Status: AC | PRN
  Administered 2013-08-26: 8 mL via INTRAVENOUS

## 2013-09-03 ENCOUNTER — Ambulatory Visit: Payer: Medicare Other | Admitting: Internal Medicine

## 2013-09-06 ENCOUNTER — Encounter: Payer: Self-pay | Admitting: Internal Medicine

## 2013-09-06 ENCOUNTER — Other Ambulatory Visit: Payer: Self-pay | Admitting: Internal Medicine

## 2013-09-06 ENCOUNTER — Telehealth: Payer: Self-pay | Admitting: Internal Medicine

## 2013-09-06 ENCOUNTER — Ambulatory Visit (INDEPENDENT_AMBULATORY_CARE_PROVIDER_SITE_OTHER): Payer: Medicare Other | Admitting: Internal Medicine

## 2013-09-06 VITALS — BP 140/86 | HR 69 | Temp 98.1°F | Resp 20 | Ht <= 58 in | Wt 187.0 lb

## 2013-09-06 DIAGNOSIS — F17200 Nicotine dependence, unspecified, uncomplicated: Secondary | ICD-10-CM | POA: Diagnosis not present

## 2013-09-06 DIAGNOSIS — M549 Dorsalgia, unspecified: Secondary | ICD-10-CM | POA: Diagnosis not present

## 2013-09-06 DIAGNOSIS — I10 Essential (primary) hypertension: Secondary | ICD-10-CM

## 2013-09-06 MED ORDER — POTASSIUM CHLORIDE CRYS ER 20 MEQ PO TBCR: 20.0000 meq | EXTENDED_RELEASE_TABLET | Freq: Every day | ORAL | Status: DC

## 2013-09-06 MED ORDER — FUROSEMIDE 20 MG PO TABS: 20.0000 mg | ORAL_TABLET | Freq: Every day | ORAL | Status: DC

## 2013-09-06 NOTE — Patient Instructions (Signed)
Limit your sodium (Salt) intake  Please check your blood pressure on a regular basis.  If it is consistently greater than 150/90, please make an office appointment.  Low-Sodium Eating Plan Sodium raises blood pressure and causes water to be held in the body. Getting less sodium from food will help lower your blood pressure, reduce any swelling, and protect your heart, liver, and kidneys. We get sodium by adding salt (sodium chloride) to food. Most of our sodium comes from canned, boxed, and frozen foods. Restaurant foods, fast foods, and pizza are also very high in sodium. Even if you take medicine to lower your blood pressure or to reduce fluid in your body, getting less sodium from your food is important. WHAT IS MY PLAN? Most people should limit their sodium intake to 2,300 mg a day. Your health care provider recommends that you limit your sodium intake to __________ a day.  WHAT DO I NEED TO KNOW ABOUT THIS EATING PLAN? For the low-sodium eating plan, you will follow these general guidelines:  Choose foods with a % Daily Value for sodium of less than 5% (as listed on the food label).   Use salt-free seasonings or herbs instead of table salt or sea salt.   Check with your health care provider or pharmacist before using salt substitutes.   Eat fresh foods.  Eat more vegetables and fruits.  Limit canned vegetables. If you do use them, rinse them well to decrease the sodium.   Limit cheese to 1 oz (28 g) per day.   Eat lower-sodium products, often labeled as "lower sodium" or "no salt added."  Avoid foods that contain monosodium glutamate (MSG). MSG is sometimes added to [place] food and some canned foods.  Check food labels (Nutrition Facts labels) on foods to learn how much sodium is in one serving.  Eat more home-cooked food and less restaurant, buffet, and fast food.  When eating at a restaurant, ask that your food be prepared with less salt or none, if possible.  HOW  DO I READ FOOD LABELS FOR SODIUM INFORMATION? The Nutrition Facts label lists the amount of sodium in one serving of the food. If you eat more than one serving, you must multiply the listed amount of sodium by the number of servings. Food labels may also identify foods as:  Sodium free--Less than 5 mg in a serving.  Very low sodium--35 mg or less in a serving.  Low sodium--140 mg or less in a serving.  Light in sodium--50% less sodium in a serving. For example, if a food that usually has 300 mg of sodium is changed to become light in sodium, it will have 150 mg of sodium.  Reduced sodium--25% less sodium in a serving. For example, if a food that usually has 400 mg of sodium is changed to reduced sodium, it will have 300 mg of sodium. WHAT FOODS CAN I EAT? Grains Low-sodium cereals, including oats, puffed wheat and rice, and shredded wheat cereals. Low-sodium crackers. Unsalted rice and pasta. Lower-sodium bread.  Vegetables Frozen or fresh vegetables. Low-sodium or reduced-sodium canned vegetables. Low-sodium or reduced-sodium tomato sauce and paste. Low-sodium or reduced-sodium tomato and vegetable juices.  Fruits Fresh, frozen, and canned fruit. Fruit juice.  Meat and Other Protein Products Low-sodium canned tuna and salmon. Fresh or frozen meat, poultry, seafood, and fish. Lamb. Unsalted nuts. Dried beans, peas, and lentils without added salt. Unsalted canned beans. Homemade soups without salt. Eggs.  Dairy Milk. Soy milk. Ricotta cheese. Low-sodium or  reduced-sodium cheeses. Yogurt.  Condiments Fresh and dried herbs and spices. Salt-free seasonings. Onion and garlic powders. Low-sodium varieties of mustard and ketchup. Lemon juice.  Fats and Oils Reduced-sodium salad dressings. Unsalted butter.  Other Unsalted popcorn and pretzels.  The items listed above may not be a complete list of recommended foods or beverages. Contact your dietitian for more options. WHAT FOODS ARE  NOT RECOMMENDED? Grains Instant hot cereals. Bread stuffing, pancake, and biscuit mixes. Croutons. Seasoned rice or pasta mixes. Noodle soup cups. Boxed or frozen macaroni and cheese. Self-rising flour. Regular salted crackers. Vegetables Regular canned vegetables. Regular canned tomato sauce and paste. Regular tomato and vegetable juices. Frozen vegetables in sauces. Salted french fries. Olives. Angie Fava. Relishes. Sauerkraut. Salsa. Meat and Other Protein Products Salted, canned, smoked, spiced, or pickled meats, seafood, or fish. Bacon, ham, sausage, hot dogs, corned beef, chipped beef, and packaged luncheon meats. Salt pork. Jerky. Pickled herring. Anchovies, regular canned tuna, and sardines. Salted nuts. Dairy Processed cheese and cheese spreads. Cheese curds. Blue cheese and cottage cheese. Buttermilk.  Condiments Onion and garlic salt, seasoned salt, table salt, and sea salt. Canned and packaged gravies. Worcestershire sauce. Tartar sauce. Barbecue sauce. Teriyaki sauce. Soy sauce, including reduced sodium. Steak sauce. Fish sauce. Oyster sauce. Cocktail sauce. Horseradish. Regular ketchup and mustard. Meat flavorings and tenderizers. Bouillon cubes. Hot sauce. Tabasco sauce. Marinades. Taco seasonings. Relishes. Fats and Oils Regular salad dressings. Salted butter. Margarine. Ghee. Bacon fat.  Other Potato and tortilla chips. Corn chips and puffs. Salted popcorn and pretzels. Canned or dried soups. Pizza. Frozen entrees and pot pies.  The items listed above may not be a complete list of foods and beverages to avoid. Contact your dietitian for more information. Document Released: 07/06/2001 Document Revised: 01/19/2013 Document Reviewed: 11/18/2012 [facility] Patient Information 2015 [place], [place]. This information is not intended to replace advice given to you by your health care provider. Make sure you discuss any questions you have with your health care provider. Low-Sodium  Eating Plan Sodium raises blood pressure and causes water to be held in the body. Getting less sodium from food will help lower your blood pressure, reduce any swelling, and protect your heart, liver, and kidneys. We get sodium by adding salt (sodium chloride) to food. Most of our sodium comes from canned, boxed, and frozen foods. Restaurant foods, fast foods, and pizza are also very high in sodium. Even if you take medicine to lower your blood pressure or to reduce fluid in your body, getting less sodium from your food is important. WHAT IS MY PLAN? Most people should limit their sodium intake to 2,300 mg a day. Your health care provider recommends that you limit your sodium intake to __________ a day.  WHAT DO I NEED TO KNOW ABOUT THIS EATING PLAN? For the low-sodium eating plan, you will follow these general guidelines:  Choose foods with a % Daily Value for sodium of less than 5% (as listed on the food label).   Use salt-free seasonings or herbs instead of table salt or sea salt.   Check with your health care provider or pharmacist before using salt substitutes.   Eat fresh foods.  Eat more vegetables and fruits.  Limit canned vegetables. If you do use them, rinse them well to decrease the sodium.   Limit cheese to 1 oz (28 g) per day.   Eat lower-sodium products, often labeled as "lower sodium" or "no salt added."  Avoid foods that contain monosodium glutamate (MSG). MSG is sometimes  added to [place] food and some canned foods.  Check food labels (Nutrition Facts labels) on foods to learn how much sodium is in one serving.  Eat more home-cooked food and less restaurant, buffet, and fast food.  When eating at a restaurant, ask that your food be prepared with less salt or none, if possible.  HOW DO I READ FOOD LABELS FOR SODIUM INFORMATION? The Nutrition Facts label lists the amount of sodium in one serving of the food. If you eat more than one serving, you must multiply  the listed amount of sodium by the number of servings. Food labels may also identify foods as:  Sodium free--Less than 5 mg in a serving.  Very low sodium--35 mg or less in a serving.  Low sodium--140 mg or less in a serving.  Light in sodium--50% less sodium in a serving. For example, if a food that usually has 300 mg of sodium is changed to become light in sodium, it will have 150 mg of sodium.  Reduced sodium--25% less sodium in a serving. For example, if a food that usually has 400 mg of sodium is changed to reduced sodium, it will have 300 mg of sodium. WHAT FOODS CAN I EAT? Grains Low-sodium cereals, including oats, puffed wheat and rice, and shredded wheat cereals. Low-sodium crackers. Unsalted rice and pasta. Lower-sodium bread.  Vegetables Frozen or fresh vegetables. Low-sodium or reduced-sodium canned vegetables. Low-sodium or reduced-sodium tomato sauce and paste. Low-sodium or reduced-sodium tomato and vegetable juices.  Fruits Fresh, frozen, and canned fruit. Fruit juice.  Meat and Other Protein Products Low-sodium canned tuna and salmon. Fresh or frozen meat, poultry, seafood, and fish. Lamb. Unsalted nuts. Dried beans, peas, and lentils without added salt. Unsalted canned beans. Homemade soups without salt. Eggs.  Dairy Milk. Soy milk. Ricotta cheese. Low-sodium or reduced-sodium cheeses. Yogurt.  Condiments Fresh and dried herbs and spices. Salt-free seasonings. Onion and garlic powders. Low-sodium varieties of mustard and ketchup. Lemon juice.  Fats and Oils Reduced-sodium salad dressings. Unsalted butter.  Other Unsalted popcorn and pretzels.  The items listed above may not be a complete list of recommended foods or beverages. Contact your dietitian for more options. WHAT FOODS ARE NOT RECOMMENDED? Grains Instant hot cereals. Bread stuffing, pancake, and biscuit mixes. Croutons. Seasoned rice or pasta mixes. Noodle soup cups. Boxed or frozen macaroni and  cheese. Self-rising flour. Regular salted crackers. Vegetables Regular canned vegetables. Regular canned tomato sauce and paste. Regular tomato and vegetable juices. Frozen vegetables in sauces. Salted french fries. Olives. Angie Fava. Relishes. Sauerkraut. Salsa. Meat and Other Protein Products Salted, canned, smoked, spiced, or pickled meats, seafood, or fish. Bacon, ham, sausage, hot dogs, corned beef, chipped beef, and packaged luncheon meats. Salt pork. Jerky. Pickled herring. Anchovies, regular canned tuna, and sardines. Salted nuts. Dairy Processed cheese and cheese spreads. Cheese curds. Blue cheese and cottage cheese. Buttermilk.  Condiments Onion and garlic salt, seasoned salt, table salt, and sea salt. Canned and packaged gravies. Worcestershire sauce. Tartar sauce. Barbecue sauce. Teriyaki sauce. Soy sauce, including reduced sodium. Steak sauce. Fish sauce. Oyster sauce. Cocktail sauce. Horseradish. Regular ketchup and mustard. Meat flavorings and tenderizers. Bouillon cubes. Hot sauce. Tabasco sauce. Marinades. Taco seasonings. Relishes. Fats and Oils Regular salad dressings. Salted butter. Margarine. Ghee. Bacon fat.  Other Potato and tortilla chips. Corn chips and puffs. Salted popcorn and pretzels. Canned or dried soups. Pizza. Frozen entrees and pot pies.  The items listed above may not be a complete list of foods and beverages  to avoid. Contact your dietitian for more information. Document Released: 07/06/2001 Document Revised: 01/19/2013 Document Reviewed: 11/18/2012 [facility] Patient Information 2015 [place], [place]. This information is not intended to replace advice given to you by your health care provider. Make sure you discuss any questions you have with your health care provider. Low-Sodium Eating Plan Sodium raises blood pressure and causes water to be held in the body. Getting less sodium from food will help lower your blood pressure, reduce any swelling, and protect  your heart, liver, and kidneys. We get sodium by adding salt (sodium chloride) to food. Most of our sodium comes from canned, boxed, and frozen foods. Restaurant foods, fast foods, and pizza are also very high in sodium. Even if you take medicine to lower your blood pressure or to reduce fluid in your body, getting less sodium from your food is important. WHAT IS MY PLAN? Most people should limit their sodium intake to 2,300 mg a day. Your health care provider recommends that you limit your sodium intake to __________ a day.  WHAT DO I NEED TO KNOW ABOUT THIS EATING PLAN? For the low-sodium eating plan, you will follow these general guidelines:  Choose foods with a % Daily Value for sodium of less than 5% (as listed on the food label).   Use salt-free seasonings or herbs instead of table salt or sea salt.   Check with your health care provider or pharmacist before using salt substitutes.   Eat fresh foods.  Eat more vegetables and fruits.  Limit canned vegetables. If you do use them, rinse them well to decrease the sodium.   Limit cheese to 1 oz (28 g) per day.   Eat lower-sodium products, often labeled as "lower sodium" or "no salt added."  Avoid foods that contain monosodium glutamate (MSG). MSG is sometimes added to [place] food and some canned foods.  Check food labels (Nutrition Facts labels) on foods to learn how much sodium is in one serving.  Eat more home-cooked food and less restaurant, buffet, and fast food.  When eating at a restaurant, ask that your food be prepared with less salt or none, if possible.  HOW DO I READ FOOD LABELS FOR SODIUM INFORMATION? The Nutrition Facts label lists the amount of sodium in one serving of the food. If you eat more than one serving, you must multiply the listed amount of sodium by the number of servings. Food labels may also identify foods as:  Sodium free--Less than 5 mg in a serving.  Very low sodium--35 mg or less in a  serving.  Low sodium--140 mg or less in a serving.  Light in sodium--50% less sodium in a serving. For example, if a food that usually has 300 mg of sodium is changed to become light in sodium, it will have 150 mg of sodium.  Reduced sodium--25% less sodium in a serving. For example, if a food that usually has 400 mg of sodium is changed to reduced sodium, it will have 300 mg of sodium. WHAT FOODS CAN I EAT? Grains Low-sodium cereals, including oats, puffed wheat and rice, and shredded wheat cereals. Low-sodium crackers. Unsalted rice and pasta. Lower-sodium bread.  Vegetables Frozen or fresh vegetables. Low-sodium or reduced-sodium canned vegetables. Low-sodium or reduced-sodium tomato sauce and paste. Low-sodium or reduced-sodium tomato and vegetable juices.  Fruits Fresh, frozen, and canned fruit. Fruit juice.  Meat and Other Protein Products Low-sodium canned tuna and salmon. Fresh or frozen meat, poultry, seafood, and fish. Lamb. Unsalted nuts. Dried  beans, peas, and lentils without added salt. Unsalted canned beans. Homemade soups without salt. Eggs.  Dairy Milk. Soy milk. Ricotta cheese. Low-sodium or reduced-sodium cheeses. Yogurt.  Condiments Fresh and dried herbs and spices. Salt-free seasonings. Onion and garlic powders. Low-sodium varieties of mustard and ketchup. Lemon juice.  Fats and Oils Reduced-sodium salad dressings. Unsalted butter.  Other Unsalted popcorn and pretzels.  The items listed above may not be a complete list of recommended foods or beverages. Contact your dietitian for more options. WHAT FOODS ARE NOT RECOMMENDED? Grains Instant hot cereals. Bread stuffing, pancake, and biscuit mixes. Croutons. Seasoned rice or pasta mixes. Noodle soup cups. Boxed or frozen macaroni and cheese. Self-rising flour. Regular salted crackers. Vegetables Regular canned vegetables. Regular canned tomato sauce and paste. Regular tomato and vegetable juices. Frozen  vegetables in sauces. Salted french fries. Olives. Angie Fava. Relishes. Sauerkraut. Salsa. Meat and Other Protein Products Salted, canned, smoked, spiced, or pickled meats, seafood, or fish. Bacon, ham, sausage, hot dogs, corned beef, chipped beef, and packaged luncheon meats. Salt pork. Jerky. Pickled herring. Anchovies, regular canned tuna, and sardines. Salted nuts. Dairy Processed cheese and cheese spreads. Cheese curds. Blue cheese and cottage cheese. Buttermilk.  Condiments Onion and garlic salt, seasoned salt, table salt, and sea salt. Canned and packaged gravies. Worcestershire sauce. Tartar sauce. Barbecue sauce. Teriyaki sauce. Soy sauce, including reduced sodium. Steak sauce. Fish sauce. Oyster sauce. Cocktail sauce. Horseradish. Regular ketchup and mustard. Meat flavorings and tenderizers. Bouillon cubes. Hot sauce. Tabasco sauce. Marinades. Taco seasonings. Relishes. Fats and Oils Regular salad dressings. Salted butter. Margarine. Ghee. Bacon fat.  Other Potato and tortilla chips. Corn chips and puffs. Salted popcorn and pretzels. Canned or dried soups. Pizza. Frozen entrees and pot pies.  The items listed above may not be a complete list of foods and beverages to avoid. Contact your dietitian for more information. Document Released: 07/06/2001 Document Revised: 01/19/2013 Document Reviewed: 11/18/2012 [facility] Patient Information 2015 [place], [place]. This information is not intended to replace advice given to you by your health care provider. Make sure you discuss any questions you have with your health care provider. Low-Sodium Eating Plan Sodium raises blood pressure and causes water to be held in the body. Getting less sodium from food will help lower your blood pressure, reduce any swelling, and protect your heart, liver, and kidneys. We get sodium by adding salt (sodium chloride) to food. Most of our sodium comes from canned, boxed, and frozen foods. Restaurant foods, fast  foods, and pizza are also very high in sodium. Even if you take medicine to lower your blood pressure or to reduce fluid in your body, getting less sodium from your food is important. WHAT IS MY PLAN? Most people should limit their sodium intake to 2,300 mg a day. Your health care provider recommends that you limit your sodium intake to __________ a day.  WHAT DO I NEED TO KNOW ABOUT THIS EATING PLAN? For the low-sodium eating plan, you will follow these general guidelines:  Choose foods with a % Daily Value for sodium of less than 5% (as listed on the food label).   Use salt-free seasonings or herbs instead of table salt or sea salt.   Check with your health care provider or pharmacist before using salt substitutes.   Eat fresh foods.  Eat more vegetables and fruits.  Limit canned vegetables. If you do use them, rinse them well to decrease the sodium.   Limit cheese to 1 oz (28 g) per day.  Eat lower-sodium products, often labeled as "lower sodium" or "no salt added."  Avoid foods that contain monosodium glutamate (MSG). MSG is sometimes added to [place] food and some canned foods.  Check food labels (Nutrition Facts labels) on foods to learn how much sodium is in one serving.  Eat more home-cooked food and less restaurant, buffet, and fast food.  When eating at a restaurant, ask that your food be prepared with less salt or none, if possible.  HOW DO I READ FOOD LABELS FOR SODIUM INFORMATION? The Nutrition Facts label lists the amount of sodium in one serving of the food. If you eat more than one serving, you must multiply the listed amount of sodium by the number of servings. Food labels may also identify foods as:  Sodium free--Less than 5 mg in a serving.  Very low sodium--35 mg or less in a serving.  Low sodium--140 mg or less in a serving.  Light in sodium--50% less sodium in a serving. For example, if a food that usually has 300 mg of sodium is changed to  become light in sodium, it will have 150 mg of sodium.  Reduced sodium--25% less sodium in a serving. For example, if a food that usually has 400 mg of sodium is changed to reduced sodium, it will have 300 mg of sodium. WHAT FOODS CAN I EAT? Grains Low-sodium cereals, including oats, puffed wheat and rice, and shredded wheat cereals. Low-sodium crackers. Unsalted rice and pasta. Lower-sodium bread.  Vegetables Frozen or fresh vegetables. Low-sodium or reduced-sodium canned vegetables. Low-sodium or reduced-sodium tomato sauce and paste. Low-sodium or reduced-sodium tomato and vegetable juices.  Fruits Fresh, frozen, and canned fruit. Fruit juice.  Meat and Other Protein Products Low-sodium canned tuna and salmon. Fresh or frozen meat, poultry, seafood, and fish. Lamb. Unsalted nuts. Dried beans, peas, and lentils without added salt. Unsalted canned beans. Homemade soups without salt. Eggs.  Dairy Milk. Soy milk. Ricotta cheese. Low-sodium or reduced-sodium cheeses. Yogurt.  Condiments Fresh and dried herbs and spices. Salt-free seasonings. Onion and garlic powders. Low-sodium varieties of mustard and ketchup. Lemon juice.  Fats and Oils Reduced-sodium salad dressings. Unsalted butter.  Other Unsalted popcorn and pretzels.  The items listed above may not be a complete list of recommended foods or beverages. Contact your dietitian for more options. WHAT FOODS ARE NOT RECOMMENDED? Grains Instant hot cereals. Bread stuffing, pancake, and biscuit mixes. Croutons. Seasoned rice or pasta mixes. Noodle soup cups. Boxed or frozen macaroni and cheese. Self-rising flour. Regular salted crackers. Vegetables Regular canned vegetables. Regular canned tomato sauce and paste. Regular tomato and vegetable juices. Frozen vegetables in sauces. Salted french fries. Olives. Angie Fava. Relishes. Sauerkraut. Salsa. Meat and Other Protein Products Salted, canned, smoked, spiced, or pickled meats,  seafood, or fish. Bacon, ham, sausage, hot dogs, corned beef, chipped beef, and packaged luncheon meats. Salt pork. Jerky. Pickled herring. Anchovies, regular canned tuna, and sardines. Salted nuts. Dairy Processed cheese and cheese spreads. Cheese curds. Blue cheese and cottage cheese. Buttermilk.  Condiments Onion and garlic salt, seasoned salt, table salt, and sea salt. Canned and packaged gravies. Worcestershire sauce. Tartar sauce. Barbecue sauce. Teriyaki sauce. Soy sauce, including reduced sodium. Steak sauce. Fish sauce. Oyster sauce. Cocktail sauce. Horseradish. Regular ketchup and mustard. Meat flavorings and tenderizers. Bouillon cubes. Hot sauce. Tabasco sauce. Marinades. Taco seasonings. Relishes. Fats and Oils Regular salad dressings. Salted butter. Margarine. Ghee. Bacon fat.  Other Potato and tortilla chips. Corn chips and puffs. Salted popcorn and pretzels. Canned or  dried soups. Pizza. Frozen entrees and pot pies.  The items listed above may not be a complete list of foods and beverages to avoid. Contact your dietitian for more information. Document Released: 07/06/2001 Document Revised: 01/19/2013 Document Reviewed: 11/18/2012 [facility] Patient Information 2015 [place], [place]. This information is not intended to replace advice given to you by your health care provider. Make sure you discuss any questions you have with your health care provider. Low-Sodium Eating Plan Sodium raises blood pressure and causes water to be held in the body. Getting less sodium from food will help lower your blood pressure, reduce any swelling, and protect your heart, liver, and kidneys. We get sodium by adding salt (sodium chloride) to food. Most of our sodium comes from canned, boxed, and frozen foods. Restaurant foods, fast foods, and pizza are also very high in sodium. Even if you take medicine to lower your blood pressure or to reduce fluid in your body, getting less sodium from your food is  important. WHAT IS MY PLAN? Most people should limit their sodium intake to 2,300 mg a day. Your health care provider recommends that you limit your sodium intake to __________ a day.  WHAT DO I NEED TO KNOW ABOUT THIS EATING PLAN? For the low-sodium eating plan, you will follow these general guidelines:  Choose foods with a % Daily Value for sodium of less than 5% (as listed on the food label).   Use salt-free seasonings or herbs instead of table salt or sea salt.   Check with your health care provider or pharmacist before using salt substitutes.   Eat fresh foods.  Eat more vegetables and fruits.  Limit canned vegetables. If you do use them, rinse them well to decrease the sodium.   Limit cheese to 1 oz (28 g) per day.   Eat lower-sodium products, often labeled as "lower sodium" or "no salt added."  Avoid foods that contain monosodium glutamate (MSG). MSG is sometimes added to [place] food and some canned foods.  Check food labels (Nutrition Facts labels) on foods to learn how much sodium is in one serving.  Eat more home-cooked food and less restaurant, buffet, and fast food.  When eating at a restaurant, ask that your food be prepared with less salt or none, if possible.  HOW DO I READ FOOD LABELS FOR SODIUM INFORMATION? The Nutrition Facts label lists the amount of sodium in one serving of the food. If you eat more than one serving, you must multiply the listed amount of sodium by the number of servings. Food labels may also identify foods as:  Sodium free--Less than 5 mg in a serving.  Very low sodium--35 mg or less in a serving.  Low sodium--140 mg or less in a serving.  Light in sodium--50% less sodium in a serving. For example, if a food that usually has 300 mg of sodium is changed to become light in sodium, it will have 150 mg of sodium.  Reduced sodium--25% less sodium in a serving. For example, if a food that usually has 400 mg of sodium is changed to  reduced sodium, it will have 300 mg of sodium. WHAT FOODS CAN I EAT? Grains Low-sodium cereals, including oats, puffed wheat and rice, and shredded wheat cereals. Low-sodium crackers. Unsalted rice and pasta. Lower-sodium bread.  Vegetables Frozen or fresh vegetables. Low-sodium or reduced-sodium canned vegetables. Low-sodium or reduced-sodium tomato sauce and paste. Low-sodium or reduced-sodium tomato and vegetable juices.  Fruits Fresh, frozen, and canned fruit. Fruit juice.  Meat and Other Protein Products Low-sodium canned tuna and salmon. Fresh or frozen meat, poultry, seafood, and fish. Lamb. Unsalted nuts. Dried beans, peas, and lentils without added salt. Unsalted canned beans. Homemade soups without salt. Eggs.  Dairy Milk. Soy milk. Ricotta cheese. Low-sodium or reduced-sodium cheeses. Yogurt.  Condiments Fresh and dried herbs and spices. Salt-free seasonings. Onion and garlic powders. Low-sodium varieties of mustard and ketchup. Lemon juice.  Fats and Oils Reduced-sodium salad dressings. Unsalted butter.  Other Unsalted popcorn and pretzels.  The items listed above may not be a complete list of recommended foods or beverages. Contact your dietitian for more options. WHAT FOODS ARE NOT RECOMMENDED? Grains Instant hot cereals. Bread stuffing, pancake, and biscuit mixes. Croutons. Seasoned rice or pasta mixes. Noodle soup cups. Boxed or frozen macaroni and cheese. Self-rising flour. Regular salted crackers. Vegetables Regular canned vegetables. Regular canned tomato sauce and paste. Regular tomato and vegetable juices. Frozen vegetables in sauces. Salted french fries. Olives. Angie Fava. Relishes. Sauerkraut. Salsa. Meat and Other Protein Products Salted, canned, smoked, spiced, or pickled meats, seafood, or fish. Bacon, ham, sausage, hot dogs, corned beef, chipped beef, and packaged luncheon meats. Salt pork. Jerky. Pickled herring. Anchovies, regular canned tuna, and  sardines. Salted nuts. Dairy Processed cheese and cheese spreads. Cheese curds. Blue cheese and cottage cheese. Buttermilk.  Condiments Onion and garlic salt, seasoned salt, table salt, and sea salt. Canned and packaged gravies. Worcestershire sauce. Tartar sauce. Barbecue sauce. Teriyaki sauce. Soy sauce, including reduced sodium. Steak sauce. Fish sauce. Oyster sauce. Cocktail sauce. Horseradish. Regular ketchup and mustard. Meat flavorings and tenderizers. Bouillon cubes. Hot sauce. Tabasco sauce. Marinades. Taco seasonings. Relishes. Fats and Oils Regular salad dressings. Salted butter. Margarine. Ghee. Bacon fat.  Other Potato and tortilla chips. Corn chips and puffs. Salted popcorn and pretzels. Canned or dried soups. Pizza. Frozen entrees and pot pies.  The items listed above may not be a complete list of foods and beverages to avoid. Contact your dietitian for more information. Document Released: 07/06/2001 Document Revised: 01/19/2013 Document Reviewed: 11/18/2012 [facility] Patient Information 2015 [place], [place]. This information is not intended to replace advice given to you by your health care provider. Make sure you discuss any questions you have with your health care provider. Low-Sodium Eating Plan Sodium raises blood pressure and causes water to be held in the body. Getting less sodium from food will help lower your blood pressure, reduce any swelling, and protect your heart, liver, and kidneys. We get sodium by adding salt (sodium chloride) to food. Most of our sodium comes from canned, boxed, and frozen foods. Restaurant foods, fast foods, and pizza are also very high in sodium. Even if you take medicine to lower your blood pressure or to reduce fluid in your body, getting less sodium from your food is important. WHAT IS MY PLAN? Most people should limit their sodium intake to 2,300 mg a day. Your health care provider recommends that you limit your sodium intake to __________ a  day.  WHAT DO I NEED TO KNOW ABOUT THIS EATING PLAN? For the low-sodium eating plan, you will follow these general guidelines:  Choose foods with a % Daily Value for sodium of less than 5% (as listed on the food label).   Use salt-free seasonings or herbs instead of table salt or sea salt.   Check with your health care provider or pharmacist before using salt substitutes.   Eat fresh foods.  Eat more vegetables and fruits.  Limit canned vegetables. If you  do use them, rinse them well to decrease the sodium.   Limit cheese to 1 oz (28 g) per day.   Eat lower-sodium products, often labeled as "lower sodium" or "no salt added."  Avoid foods that contain monosodium glutamate (MSG). MSG is sometimes added to [place] food and some canned foods.  Check food labels (Nutrition Facts labels) on foods to learn how much sodium is in one serving.  Eat more home-cooked food and less restaurant, buffet, and fast food.  When eating at a restaurant, ask that your food be prepared with less salt or none, if possible.  HOW DO I READ FOOD LABELS FOR SODIUM INFORMATION? The Nutrition Facts label lists the amount of sodium in one serving of the food. If you eat more than one serving, you must multiply the listed amount of sodium by the number of servings. Food labels may also identify foods as:  Sodium free--Less than 5 mg in a serving.  Very low sodium--35 mg or less in a serving.  Low sodium--140 mg or less in a serving.  Light in sodium--50% less sodium in a serving. For example, if a food that usually has 300 mg of sodium is changed to become light in sodium, it will have 150 mg of sodium.  Reduced sodium--25% less sodium in a serving. For example, if a food that usually has 400 mg of sodium is changed to reduced sodium, it will have 300 mg of sodium. WHAT FOODS CAN I EAT? Grains Low-sodium cereals, including oats, puffed wheat and rice, and shredded wheat cereals. Low-sodium  crackers. Unsalted rice and pasta. Lower-sodium bread.  Vegetables Frozen or fresh vegetables. Low-sodium or reduced-sodium canned vegetables. Low-sodium or reduced-sodium tomato sauce and paste. Low-sodium or reduced-sodium tomato and vegetable juices.  Fruits Fresh, frozen, and canned fruit. Fruit juice.  Meat and Other Protein Products Low-sodium canned tuna and salmon. Fresh or frozen meat, poultry, seafood, and fish. Lamb. Unsalted nuts. Dried beans, peas, and lentils without added salt. Unsalted canned beans. Homemade soups without salt. Eggs.  Dairy Milk. Soy milk. Ricotta cheese. Low-sodium or reduced-sodium cheeses. Yogurt.  Condiments Fresh and dried herbs and spices. Salt-free seasonings. Onion and garlic powders. Low-sodium varieties of mustard and ketchup. Lemon juice.  Fats and Oils Reduced-sodium salad dressings. Unsalted butter.  Other Unsalted popcorn and pretzels.  The items listed above may not be a complete list of recommended foods or beverages. Contact your dietitian for more options. WHAT FOODS ARE NOT RECOMMENDED? Grains Instant hot cereals. Bread stuffing, pancake, and biscuit mixes. Croutons. Seasoned rice or pasta mixes. Noodle soup cups. Boxed or frozen macaroni and cheese. Self-rising flour. Regular salted crackers. Vegetables Regular canned vegetables. Regular canned tomato sauce and paste. Regular tomato and vegetable juices. Frozen vegetables in sauces. Salted french fries. Olives. Angie Fava. Relishes. Sauerkraut. Salsa. Meat and Other Protein Products Salted, canned, smoked, spiced, or pickled meats, seafood, or fish. Bacon, ham, sausage, hot dogs, corned beef, chipped beef, and packaged luncheon meats. Salt pork. Jerky. Pickled herring. Anchovies, regular canned tuna, and sardines. Salted nuts. Dairy Processed cheese and cheese spreads. Cheese curds. Blue cheese and cottage cheese. Buttermilk.  Condiments Onion and garlic salt, seasoned salt,  table salt, and sea salt. Canned and packaged gravies. Worcestershire sauce. Tartar sauce. Barbecue sauce. Teriyaki sauce. Soy sauce, including reduced sodium. Steak sauce. Fish sauce. Oyster sauce. Cocktail sauce. Horseradish. Regular ketchup and mustard. Meat flavorings and tenderizers. Bouillon cubes. Hot sauce. Tabasco sauce. Marinades. Taco seasonings. Relishes. Fats and Oils Regular salad  dressings. Salted butter. Margarine. Ghee. Bacon fat.  Other Potato and tortilla chips. Corn chips and puffs. Salted popcorn and pretzels. Canned or dried soups. Pizza. Frozen entrees and pot pies.  The items listed above may not be a complete list of foods and beverages to avoid. Contact your dietitian for more information. Document Released: 07/06/2001 Document Revised: 01/19/2013 Document Reviewed: 11/18/2012 [facility] Patient Information 2015 [place], [place]. This information is not intended to replace advice given to you by your health care provider. Make sure you discuss any questions you have with your health care provider. Low-Sodium Eating Plan Sodium raises blood pressure and causes water to be held in the body. Getting less sodium from food will help lower your blood pressure, reduce any swelling, and protect your heart, liver, and kidneys. We get sodium by adding salt (sodium chloride) to food. Most of our sodium comes from canned, boxed, and frozen foods. Restaurant foods, fast foods, and pizza are also very high in sodium. Even if you take medicine to lower your blood pressure or to reduce fluid in your body, getting less sodium from your food is important. WHAT IS MY PLAN? Most people should limit their sodium intake to 2,300 mg a day. Your health care provider recommends that you limit your sodium intake to __________ a day.  WHAT DO I NEED TO KNOW ABOUT THIS EATING PLAN? For the low-sodium eating plan, you will follow these general guidelines:  Choose foods with a % Daily Value for sodium of  less than 5% (as listed on the food label).   Use salt-free seasonings or herbs instead of table salt or sea salt.   Check with your health care provider or pharmacist before using salt substitutes.   Eat fresh foods.  Eat more vegetables and fruits.  Limit canned vegetables. If you do use them, rinse them well to decrease the sodium.   Limit cheese to 1 oz (28 g) per day.   Eat lower-sodium products, often labeled as "lower sodium" or "no salt added."  Avoid foods that contain monosodium glutamate (MSG). MSG is sometimes added to [place] food and some canned foods.  Check food labels (Nutrition Facts labels) on foods to learn how much sodium is in one serving.  Eat more home-cooked food and less restaurant, buffet, and fast food.  When eating at a restaurant, ask that your food be prepared with less salt or none, if possible.  HOW DO I READ FOOD LABELS FOR SODIUM INFORMATION? The Nutrition Facts label lists the amount of sodium in one serving of the food. If you eat more than one serving, you must multiply the listed amount of sodium by the number of servings. Food labels may also identify foods as:  Sodium free--Less than 5 mg in a serving.  Very low sodium--35 mg or less in a serving.  Low sodium--140 mg or less in a serving.  Light in sodium--50% less sodium in a serving. For example, if a food that usually has 300 mg of sodium is changed to become light in sodium, it will have 150 mg of sodium.  Reduced sodium--25% less sodium in a serving. For example, if a food that usually has 400 mg of sodium is changed to reduced sodium, it will have 300 mg of sodium. WHAT FOODS CAN I EAT? Grains Low-sodium cereals, including oats, puffed wheat and rice, and shredded wheat cereals. Low-sodium crackers. Unsalted rice and pasta. Lower-sodium bread.  Vegetables Frozen or fresh vegetables. Low-sodium or reduced-sodium canned vegetables. Low-sodium  or reduced-sodium tomato  sauce and paste. Low-sodium or reduced-sodium tomato and vegetable juices.  Fruits Fresh, frozen, and canned fruit. Fruit juice.  Meat and Other Protein Products Low-sodium canned tuna and salmon. Fresh or frozen meat, poultry, seafood, and fish. Lamb. Unsalted nuts. Dried beans, peas, and lentils without added salt. Unsalted canned beans. Homemade soups without salt. Eggs.  Dairy Milk. Soy milk. Ricotta cheese. Low-sodium or reduced-sodium cheeses. Yogurt.  Condiments Fresh and dried herbs and spices. Salt-free seasonings. Onion and garlic powders. Low-sodium varieties of mustard and ketchup. Lemon juice.  Fats and Oils Reduced-sodium salad dressings. Unsalted butter.  Other Unsalted popcorn and pretzels.  The items listed above may not be a complete list of recommended foods or beverages. Contact your dietitian for more options. WHAT FOODS ARE NOT RECOMMENDED? Grains Instant hot cereals. Bread stuffing, pancake, and biscuit mixes. Croutons. Seasoned rice or pasta mixes. Noodle soup cups. Boxed or frozen macaroni and cheese. Self-rising flour. Regular salted crackers. Vegetables Regular canned vegetables. Regular canned tomato sauce and paste. Regular tomato and vegetable juices. Frozen vegetables in sauces. Salted french fries. Olives. Angie Fava. Relishes. Sauerkraut. Salsa. Meat and Other Protein Products Salted, canned, smoked, spiced, or pickled meats, seafood, or fish. Bacon, ham, sausage, hot dogs, corned beef, chipped beef, and packaged luncheon meats. Salt pork. Jerky. Pickled herring. Anchovies, regular canned tuna, and sardines. Salted nuts. Dairy Processed cheese and cheese spreads. Cheese curds. Blue cheese and cottage cheese. Buttermilk.  Condiments Onion and garlic salt, seasoned salt, table salt, and sea salt. Canned and packaged gravies. Worcestershire sauce. Tartar sauce. Barbecue sauce. Teriyaki sauce. Soy sauce, including reduced sodium. Steak sauce. Fish sauce.  Oyster sauce. Cocktail sauce. Horseradish. Regular ketchup and mustard. Meat flavorings and tenderizers. Bouillon cubes. Hot sauce. Tabasco sauce. Marinades. Taco seasonings. Relishes. Fats and Oils Regular salad dressings. Salted butter. Margarine. Ghee. Bacon fat.  Other Potato and tortilla chips. Corn chips and puffs. Salted popcorn and pretzels. Canned or dried soups. Pizza. Frozen entrees and pot pies.  The items listed above may not be a complete list of foods and beverages to avoid. Contact your dietitian for more information. Document Released: 07/06/2001 Document Revised: 01/19/2013 Document Reviewed: 11/18/2012 [facility] Patient Information 2015 [place], [place]. This information is not intended to replace advice given to you by your health care provider. Make sure you discuss any questions you have with your health care provider. Low-Sodium Eating Plan Sodium raises blood pressure and causes water to be held in the body. Getting less sodium from food will help lower your blood pressure, reduce any swelling, and protect your heart, liver, and kidneys. We get sodium by adding salt (sodium chloride) to food. Most of our sodium comes from canned, boxed, and frozen foods. Restaurant foods, fast foods, and pizza are also very high in sodium. Even if you take medicine to lower your blood pressure or to reduce fluid in your body, getting less sodium from your food is important. WHAT IS MY PLAN? Most people should limit their sodium intake to 2,300 mg a day. Your health care provider recommends that you limit your sodium intake to __________ a day.  WHAT DO I NEED TO KNOW ABOUT THIS EATING PLAN? For the low-sodium eating plan, you will follow these general guidelines:  Choose foods with a % Daily Value for sodium of less than 5% (as listed on the food label).   Use salt-free seasonings or herbs instead of table salt or sea salt.   Check with your health care provider  or pharmacist before  using salt substitutes.   Eat fresh foods.  Eat more vegetables and fruits.  Limit canned vegetables. If you do use them, rinse them well to decrease the sodium.   Limit cheese to 1 oz (28 g) per day.   Eat lower-sodium products, often labeled as "lower sodium" or "no salt added."  Avoid foods that contain monosodium glutamate (MSG). MSG is sometimes added to [place] food and some canned foods.  Check food labels (Nutrition Facts labels) on foods to learn how much sodium is in one serving.  Eat more home-cooked food and less restaurant, buffet, and fast food.  When eating at a restaurant, ask that your food be prepared with less salt or none, if possible.  HOW DO I READ FOOD LABELS FOR SODIUM INFORMATION? The Nutrition Facts label lists the amount of sodium in one serving of the food. If you eat more than one serving, you must multiply the listed amount of sodium by the number of servings. Food labels may also identify foods as:  Sodium free--Less than 5 mg in a serving.  Very low sodium--35 mg or less in a serving.  Low sodium--140 mg or less in a serving.  Light in sodium--50% less sodium in a serving. For example, if a food that usually has 300 mg of sodium is changed to become light in sodium, it will have 150 mg of sodium.  Reduced sodium--25% less sodium in a serving. For example, if a food that usually has 400 mg of sodium is changed to reduced sodium, it will have 300 mg of sodium. WHAT FOODS CAN I EAT? Grains Low-sodium cereals, including oats, puffed wheat and rice, and shredded wheat cereals. Low-sodium crackers. Unsalted rice and pasta. Lower-sodium bread.  Vegetables Frozen or fresh vegetables. Low-sodium or reduced-sodium canned vegetables. Low-sodium or reduced-sodium tomato sauce and paste. Low-sodium or reduced-sodium tomato and vegetable juices.  Fruits Fresh, frozen, and canned fruit. Fruit juice.  Meat and Other Protein Products Low-sodium  canned tuna and salmon. Fresh or frozen meat, poultry, seafood, and fish. Lamb. Unsalted nuts. Dried beans, peas, and lentils without added salt. Unsalted canned beans. Homemade soups without salt. Eggs.  Dairy Milk. Soy milk. Ricotta cheese. Low-sodium or reduced-sodium cheeses. Yogurt.  Condiments Fresh and dried herbs and spices. Salt-free seasonings. Onion and garlic powders. Low-sodium varieties of mustard and ketchup. Lemon juice.  Fats and Oils Reduced-sodium salad dressings. Unsalted butter.  Other Unsalted popcorn and pretzels.  The items listed above may not be a complete list of recommended foods or beverages. Contact your dietitian for more options. WHAT FOODS ARE NOT RECOMMENDED? Grains Instant hot cereals. Bread stuffing, pancake, and biscuit mixes. Croutons. Seasoned rice or pasta mixes. Noodle soup cups. Boxed or frozen macaroni and cheese. Self-rising flour. Regular salted crackers. Vegetables Regular canned vegetables. Regular canned tomato sauce and paste. Regular tomato and vegetable juices. Frozen vegetables in sauces. Salted french fries. Olives. Angie Fava. Relishes. Sauerkraut. Salsa. Meat and Other Protein Products Salted, canned, smoked, spiced, or pickled meats, seafood, or fish. Bacon, ham, sausage, hot dogs, corned beef, chipped beef, and packaged luncheon meats. Salt pork. Jerky. Pickled herring. Anchovies, regular canned tuna, and sardines. Salted nuts. Dairy Processed cheese and cheese spreads. Cheese curds. Blue cheese and cottage cheese. Buttermilk.  Condiments Onion and garlic salt, seasoned salt, table salt, and sea salt. Canned and packaged gravies. Worcestershire sauce. Tartar sauce. Barbecue sauce. Teriyaki sauce. Soy sauce, including reduced sodium. Steak sauce. Fish sauce. Oyster sauce. Cocktail sauce. Horseradish.  Regular ketchup and mustard. Meat flavorings and tenderizers. Bouillon cubes. Hot sauce. Tabasco sauce. Marinades. Taco seasonings.  Relishes. Fats and Oils Regular salad dressings. Salted butter. Margarine. Ghee. Bacon fat.  Other Potato and tortilla chips. Corn chips and puffs. Salted popcorn and pretzels. Canned or dried soups. Pizza. Frozen entrees and pot pies.  The items listed above may not be a complete list of foods and beverages to avoid. Contact your dietitian for more information. Document Released: 07/06/2001 Document Revised: 01/19/2013 Document Reviewed: 11/18/2012 [facility] Patient Information 2015 [place], [place]. This information is not intended to replace advice given to you by your health care provider. Make sure you discuss any questions you have with your health care provider. Low-Sodium Eating Plan Sodium raises blood pressure and causes water to be held in the body. Getting less sodium from food will help lower your blood pressure, reduce any swelling, and protect your heart, liver, and kidneys. We get sodium by adding salt (sodium chloride) to food. Most of our sodium comes from canned, boxed, and frozen foods. Restaurant foods, fast foods, and pizza are also very high in sodium. Even if you take medicine to lower your blood pressure or to reduce fluid in your body, getting less sodium from your food is important. WHAT IS MY PLAN? Most people should limit their sodium intake to 2,300 mg a day. Your health care provider recommends that you limit your sodium intake to __________ a day.  WHAT DO I NEED TO KNOW ABOUT THIS EATING PLAN? For the low-sodium eating plan, you will follow these general guidelines:  Choose foods with a % Daily Value for sodium of less than 5% (as listed on the food label).   Use salt-free seasonings or herbs instead of table salt or sea salt.   Check with your health care provider or pharmacist before using salt substitutes.   Eat fresh foods.  Eat more vegetables and fruits.  Limit canned vegetables. If you do use them, rinse them well to decrease the sodium.    Limit cheese to 1 oz (28 g) per day.   Eat lower-sodium products, often labeled as "lower sodium" or "no salt added."  Avoid foods that contain monosodium glutamate (MSG). MSG is sometimes added to [place] food and some canned foods.  Check food labels (Nutrition Facts labels) on foods to learn how much sodium is in one serving.  Eat more home-cooked food and less restaurant, buffet, and fast food.  When eating at a restaurant, ask that your food be prepared with less salt or none, if possible.  HOW DO I READ FOOD LABELS FOR SODIUM INFORMATION? The Nutrition Facts label lists the amount of sodium in one serving of the food. If you eat more than one serving, you must multiply the listed amount of sodium by the number of servings. Food labels may also identify foods as:  Sodium free--Less than 5 mg in a serving.  Very low sodium--35 mg or less in a serving.  Low sodium--140 mg or less in a serving.  Light in sodium--50% less sodium in a serving. For example, if a food that usually has 300 mg of sodium is changed to become light in sodium, it will have 150 mg of sodium.  Reduced sodium--25% less sodium in a serving. For example, if a food that usually has 400 mg of sodium is changed to reduced sodium, it will have 300 mg of sodium. WHAT FOODS CAN I EAT? Grains Low-sodium cereals, including oats, puffed wheat and rice,  and shredded wheat cereals. Low-sodium crackers. Unsalted rice and pasta. Lower-sodium bread.  Vegetables Frozen or fresh vegetables. Low-sodium or reduced-sodium canned vegetables. Low-sodium or reduced-sodium tomato sauce and paste. Low-sodium or reduced-sodium tomato and vegetable juices.  Fruits Fresh, frozen, and canned fruit. Fruit juice.  Meat and Other Protein Products Low-sodium canned tuna and salmon. Fresh or frozen meat, poultry, seafood, and fish. Lamb. Unsalted nuts. Dried beans, peas, and lentils without added salt. Unsalted canned beans.  Homemade soups without salt. Eggs.  Dairy Milk. Soy milk. Ricotta cheese. Low-sodium or reduced-sodium cheeses. Yogurt.  Condiments Fresh and dried herbs and spices. Salt-free seasonings. Onion and garlic powders. Low-sodium varieties of mustard and ketchup. Lemon juice.  Fats and Oils Reduced-sodium salad dressings. Unsalted butter.  Other Unsalted popcorn and pretzels.  The items listed above may not be a complete list of recommended foods or beverages. Contact your dietitian for more options. WHAT FOODS ARE NOT RECOMMENDED? Grains Instant hot cereals. Bread stuffing, pancake, and biscuit mixes. Croutons. Seasoned rice or pasta mixes. Noodle soup cups. Boxed or frozen macaroni and cheese. Self-rising flour. Regular salted crackers. Vegetables Regular canned vegetables. Regular canned tomato sauce and paste. Regular tomato and vegetable juices. Frozen vegetables in sauces. Salted french fries. Olives. Angie Fava. Relishes. Sauerkraut. Salsa. Meat and Other Protein Products Salted, canned, smoked, spiced, or pickled meats, seafood, or fish. Bacon, ham, sausage, hot dogs, corned beef, chipped beef, and packaged luncheon meats. Salt pork. Jerky. Pickled herring. Anchovies, regular canned tuna, and sardines. Salted nuts. Dairy Processed cheese and cheese spreads. Cheese curds. Blue cheese and cottage cheese. Buttermilk.  Condiments Onion and garlic salt, seasoned salt, table salt, and sea salt. Canned and packaged gravies. Worcestershire sauce. Tartar sauce. Barbecue sauce. Teriyaki sauce. Soy sauce, including reduced sodium. Steak sauce. Fish sauce. Oyster sauce. Cocktail sauce. Horseradish. Regular ketchup and mustard. Meat flavorings and tenderizers. Bouillon cubes. Hot sauce. Tabasco sauce. Marinades. Taco seasonings. Relishes. Fats and Oils Regular salad dressings. Salted butter. Margarine. Ghee. Bacon fat.  Other Potato and tortilla chips. Corn chips and puffs. Salted popcorn  and pretzels. Canned or dried soups. Pizza. Frozen entrees and pot pies.  The items listed above may not be a complete list of foods and beverages to avoid. Contact your dietitian for more information. Document Released: 07/06/2001 Document Revised: 01/19/2013 Document Reviewed: 11/18/2012 [facility] Patient Information 2015 [place], [place]. This information is not intended to replace advice given to you by your health care provider. Make sure you discuss any questions you have with your health care provider. Low-Sodium Eating Plan Sodium raises blood pressure and causes water to be held in the body. Getting less sodium from food will help lower your blood pressure, reduce any swelling, and protect your heart, liver, and kidneys. We get sodium by adding salt (sodium chloride) to food. Most of our sodium comes from canned, boxed, and frozen foods. Restaurant foods, fast foods, and pizza are also very high in sodium. Even if you take medicine to lower your blood pressure or to reduce fluid in your body, getting less sodium from your food is important. WHAT IS MY PLAN? Most people should limit their sodium intake to 2,300 mg a day. Your health care provider recommends that you limit your sodium intake to __________ a day.  WHAT DO I NEED TO KNOW ABOUT THIS EATING PLAN? For the low-sodium eating plan, you will follow these general guidelines:  Choose foods with a % Daily Value for sodium of less than 5% (as listed on the  food label).   Use salt-free seasonings or herbs instead of table salt or sea salt.   Check with your health care provider or pharmacist before using salt substitutes.   Eat fresh foods.  Eat more vegetables and fruits.  Limit canned vegetables. If you do use them, rinse them well to decrease the sodium.   Limit cheese to 1 oz (28 g) per day.   Eat lower-sodium products, often labeled as "lower sodium" or "no salt added."  Avoid foods that contain monosodium glutamate  (MSG). MSG is sometimes added to [place] food and some canned foods.  Check food labels (Nutrition Facts labels) on foods to learn how much sodium is in one serving.  Eat more home-cooked food and less restaurant, buffet, and fast food.  When eating at a restaurant, ask that your food be prepared with less salt or none, if possible.  HOW DO I READ FOOD LABELS FOR SODIUM INFORMATION? The Nutrition Facts label lists the amount of sodium in one serving of the food. If you eat more than one serving, you must multiply the listed amount of sodium by the number of servings. Food labels may also identify foods as:  Sodium free--Less than 5 mg in a serving.  Very low sodium--35 mg or less in a serving.  Low sodium--140 mg or less in a serving.  Light in sodium--50% less sodium in a serving. For example, if a food that usually has 300 mg of sodium is changed to become light in sodium, it will have 150 mg of sodium.  Reduced sodium--25% less sodium in a serving. For example, if a food that usually has 400 mg of sodium is changed to reduced sodium, it will have 300 mg of sodium. WHAT FOODS CAN I EAT? Grains Low-sodium cereals, including oats, puffed wheat and rice, and shredded wheat cereals. Low-sodium crackers. Unsalted rice and pasta. Lower-sodium bread.  Vegetables Frozen or fresh vegetables. Low-sodium or reduced-sodium canned vegetables. Low-sodium or reduced-sodium tomato sauce and paste. Low-sodium or reduced-sodium tomato and vegetable juices.  Fruits Fresh, frozen, and canned fruit. Fruit juice.  Meat and Other Protein Products Low-sodium canned tuna and salmon. Fresh or frozen meat, poultry, seafood, and fish. Lamb. Unsalted nuts. Dried beans, peas, and lentils without added salt. Unsalted canned beans. Homemade soups without salt. Eggs.  Dairy Milk. Soy milk. Ricotta cheese. Low-sodium or reduced-sodium cheeses. Yogurt.  Condiments Fresh and dried herbs and spices. Salt-free  seasonings. Onion and garlic powders. Low-sodium varieties of mustard and ketchup. Lemon juice.  Fats and Oils Reduced-sodium salad dressings. Unsalted butter.  Other Unsalted popcorn and pretzels.  The items listed above may not be a complete list of recommended foods or beverages. Contact your dietitian for more options. WHAT FOODS ARE NOT RECOMMENDED? Grains Instant hot cereals. Bread stuffing, pancake, and biscuit mixes. Croutons. Seasoned rice or pasta mixes. Noodle soup cups. Boxed or frozen macaroni and cheese. Self-rising flour. Regular salted crackers. Vegetables Regular canned vegetables. Regular canned tomato sauce and paste. Regular tomato and vegetable juices. Frozen vegetables in sauces. Salted french fries. Olives. Angie Fava. Relishes. Sauerkraut. Salsa. Meat and Other Protein Products Salted, canned, smoked, spiced, or pickled meats, seafood, or fish. Bacon, ham, sausage, hot dogs, corned beef, chipped beef, and packaged luncheon meats. Salt pork. Jerky. Pickled herring. Anchovies, regular canned tuna, and sardines. Salted nuts. Dairy Processed cheese and cheese spreads. Cheese curds. Blue cheese and cottage cheese. Buttermilk.  Condiments Onion and garlic salt, seasoned salt, table salt, and sea salt. Canned and  packaged gravies. Worcestershire sauce. Tartar sauce. Barbecue sauce. Teriyaki sauce. Soy sauce, including reduced sodium. Steak sauce. Fish sauce. Oyster sauce. Cocktail sauce. Horseradish. Regular ketchup and mustard. Meat flavorings and tenderizers. Bouillon cubes. Hot sauce. Tabasco sauce. Marinades. Taco seasonings. Relishes. Fats and Oils Regular salad dressings. Salted butter. Margarine. Ghee. Bacon fat.  Other Potato and tortilla chips. Corn chips and puffs. Salted popcorn and pretzels. Canned or dried soups. Pizza. Frozen entrees and pot pies.  The items listed above may not be a complete list of foods and beverages to avoid. Contact your dietitian  for more information. Document Released: 07/06/2001 Document Revised: 01/19/2013 Document Reviewed: 11/18/2012 [facility] Patient Information 2015 [place], [place]. This information is not intended to replace advice given to you by your health care provider. Make sure you discuss any questions you have with your health care provider. Low-Sodium Eating Plan Sodium raises blood pressure and causes water to be held in the body. Getting less sodium from food will help lower your blood pressure, reduce any swelling, and protect your heart, liver, and kidneys. We get sodium by adding salt (sodium chloride) to food. Most of our sodium comes from canned, boxed, and frozen foods. Restaurant foods, fast foods, and pizza are also very high in sodium. Even if you take medicine to lower your blood pressure or to reduce fluid in your body, getting less sodium from your food is important. WHAT IS MY PLAN? Most people should limit their sodium intake to 2,300 mg a day. Your health care provider recommends that you limit your sodium intake to __________ a day.  WHAT DO I NEED TO KNOW ABOUT THIS EATING PLAN? For the low-sodium eating plan, you will follow these general guidelines:  Choose foods with a % Daily Value for sodium of less than 5% (as listed on the food label).   Use salt-free seasonings or herbs instead of table salt or sea salt.   Check with your health care provider or pharmacist before using salt substitutes.   Eat fresh foods.  Eat more vegetables and fruits.  Limit canned vegetables. If you do use them, rinse them well to decrease the sodium.   Limit cheese to 1 oz (28 g) per day.   Eat lower-sodium products, often labeled as "lower sodium" or "no salt added."  Avoid foods that contain monosodium glutamate (MSG). MSG is sometimes added to [place] food and some canned foods.  Check food labels (Nutrition Facts labels) on foods to learn how much sodium is in one serving.  Eat more  home-cooked food and less restaurant, buffet, and fast food.  When eating at a restaurant, ask that your food be prepared with less salt or none, if possible.  HOW DO I READ FOOD LABELS FOR SODIUM INFORMATION? The Nutrition Facts label lists the amount of sodium in one serving of the food. If you eat more than one serving, you must multiply the listed amount of sodium by the number of servings. Food labels may also identify foods as:  Sodium free--Less than 5 mg in a serving.  Very low sodium--35 mg or less in a serving.  Low sodium--140 mg or less in a serving.  Light in sodium--50% less sodium in a serving. For example, if a food that usually has 300 mg of sodium is changed to become light in sodium, it will have 150 mg of sodium.  Reduced sodium--25% less sodium in a serving. For example, if a food that usually has 400 mg of sodium is changed  to reduced sodium, it will have 300 mg of sodium. WHAT FOODS CAN I EAT? Grains Low-sodium cereals, including oats, puffed wheat and rice, and shredded wheat cereals. Low-sodium crackers. Unsalted rice and pasta. Lower-sodium bread.  Vegetables Frozen or fresh vegetables. Low-sodium or reduced-sodium canned vegetables. Low-sodium or reduced-sodium tomato sauce and paste. Low-sodium or reduced-sodium tomato and vegetable juices.  Fruits Fresh, frozen, and canned fruit. Fruit juice.  Meat and Other Protein Products Low-sodium canned tuna and salmon. Fresh or frozen meat, poultry, seafood, and fish. Lamb. Unsalted nuts. Dried beans, peas, and lentils without added salt. Unsalted canned beans. Homemade soups without salt. Eggs.  Dairy Milk. Soy milk. Ricotta cheese. Low-sodium or reduced-sodium cheeses. Yogurt.  Condiments Fresh and dried herbs and spices. Salt-free seasonings. Onion and garlic powders. Low-sodium varieties of mustard and ketchup. Lemon juice.  Fats and Oils Reduced-sodium salad dressings. Unsalted butter.   Other Unsalted popcorn and pretzels.  The items listed above may not be a complete list of recommended foods or beverages. Contact your dietitian for more options. WHAT FOODS ARE NOT RECOMMENDED? Grains Instant hot cereals. Bread stuffing, pancake, and biscuit mixes. Croutons. Seasoned rice or pasta mixes. Noodle soup cups. Boxed or frozen macaroni and cheese. Self-rising flour. Regular salted crackers. Vegetables Regular canned vegetables. Regular canned tomato sauce and paste. Regular tomato and vegetable juices. Frozen vegetables in sauces. Salted french fries. Olives. Angie Fava. Relishes. Sauerkraut. Salsa. Meat and Other Protein Products Salted, canned, smoked, spiced, or pickled meats, seafood, or fish. Bacon, ham, sausage, hot dogs, corned beef, chipped beef, and packaged luncheon meats. Salt pork. Jerky. Pickled herring. Anchovies, regular canned tuna, and sardines. Salted nuts. Dairy Processed cheese and cheese spreads. Cheese curds. Blue cheese and cottage cheese. Buttermilk.  Condiments Onion and garlic salt, seasoned salt, table salt, and sea salt. Canned and packaged gravies. Worcestershire sauce. Tartar sauce. Barbecue sauce. Teriyaki sauce. Soy sauce, including reduced sodium. Steak sauce. Fish sauce. Oyster sauce. Cocktail sauce. Horseradish. Regular ketchup and mustard. Meat flavorings and tenderizers. Bouillon cubes. Hot sauce. Tabasco sauce. Marinades. Taco seasonings. Relishes. Fats and Oils Regular salad dressings. Salted butter. Margarine. Ghee. Bacon fat.  Other Potato and tortilla chips. Corn chips and puffs. Salted popcorn and pretzels. Canned or dried soups. Pizza. Frozen entrees and pot pies.  The items listed above may not be a complete list of foods and beverages to avoid. Contact your dietitian for more information. Document Released: 07/06/2001 Document Revised: 01/19/2013 Document Reviewed: 11/18/2012 [facility] Patient Information 2015 [place], [place].  This information is not intended to replace advice given to you by your health care provider. Make sure you discuss any questions you have with your health care provider. Low-Sodium Eating Plan Sodium raises blood pressure and causes water to be held in the body. Getting less sodium from food will help lower your blood pressure, reduce any swelling, and protect your heart, liver, and kidneys. We get sodium by adding salt (sodium chloride) to food. Most of our sodium comes from canned, boxed, and frozen foods. Restaurant foods, fast foods, and pizza are also very high in sodium. Even if you take medicine to lower your blood pressure or to reduce fluid in your body, getting less sodium from your food is important. WHAT IS MY PLAN? Most people should limit their sodium intake to 2,300 mg a day. Your health care provider recommends that you limit your sodium intake to __________ a day.  WHAT DO I NEED TO KNOW ABOUT THIS EATING PLAN? For the low-sodium eating plan,  you will follow these general guidelines:  Choose foods with a % Daily Value for sodium of less than 5% (as listed on the food label).   Use salt-free seasonings or herbs instead of table salt or sea salt.   Check with your health care provider or pharmacist before using salt substitutes.   Eat fresh foods.  Eat more vegetables and fruits.  Limit canned vegetables. If you do use them, rinse them well to decrease the sodium.   Limit cheese to 1 oz (28 g) per day.   Eat lower-sodium products, often labeled as "lower sodium" or "no salt added."  Avoid foods that contain monosodium glutamate (MSG). MSG is sometimes added to [place] food and some canned foods.  Check food labels (Nutrition Facts labels) on foods to learn how much sodium is in one serving.  Eat more home-cooked food and less restaurant, buffet, and fast food.  When eating at a restaurant, ask that your food be prepared with less salt or none, if possible.   HOW DO I READ FOOD LABELS FOR SODIUM INFORMATION? The Nutrition Facts label lists the amount of sodium in one serving of the food. If you eat more than one serving, you must multiply the listed amount of sodium by the number of servings. Food labels may also identify foods as:  Sodium free--Less than 5 mg in a serving.  Very low sodium--35 mg or less in a serving.  Low sodium--140 mg or less in a serving.  Light in sodium--50% less sodium in a serving. For example, if a food that usually has 300 mg of sodium is changed to become light in sodium, it will have 150 mg of sodium.  Reduced sodium--25% less sodium in a serving. For example, if a food that usually has 400 mg of sodium is changed to reduced sodium, it will have 300 mg of sodium. WHAT FOODS CAN I EAT? Grains Low-sodium cereals, including oats, puffed wheat and rice, and shredded wheat cereals. Low-sodium crackers. Unsalted rice and pasta. Lower-sodium bread.  Vegetables Frozen or fresh vegetables. Low-sodium or reduced-sodium canned vegetables. Low-sodium or reduced-sodium tomato sauce and paste. Low-sodium or reduced-sodium tomato and vegetable juices.  Fruits Fresh, frozen, and canned fruit. Fruit juice.  Meat and Other Protein Products Low-sodium canned tuna and salmon. Fresh or frozen meat, poultry, seafood, and fish. Lamb. Unsalted nuts. Dried beans, peas, and lentils without added salt. Unsalted canned beans. Homemade soups without salt. Eggs.  Dairy Milk. Soy milk. Ricotta cheese. Low-sodium or reduced-sodium cheeses. Yogurt.  Condiments Fresh and dried herbs and spices. Salt-free seasonings. Onion and garlic powders. Low-sodium varieties of mustard and ketchup. Lemon juice.  Fats and Oils Reduced-sodium salad dressings. Unsalted butter.  Other Unsalted popcorn and pretzels.  The items listed above may not be a complete list of recommended foods or beverages. Contact your dietitian for more options. WHAT  FOODS ARE NOT RECOMMENDED? Grains Instant hot cereals. Bread stuffing, pancake, and biscuit mixes. Croutons. Seasoned rice or pasta mixes. Noodle soup cups. Boxed or frozen macaroni and cheese. Self-rising flour. Regular salted crackers. Vegetables Regular canned vegetables. Regular canned tomato sauce and paste. Regular tomato and vegetable juices. Frozen vegetables in sauces. Salted french fries. Olives. Angie Fava. Relishes. Sauerkraut. Salsa. Meat and Other Protein Products Salted, canned, smoked, spiced, or pickled meats, seafood, or fish. Bacon, ham, sausage, hot dogs, corned beef, chipped beef, and packaged luncheon meats. Salt pork. Jerky. Pickled herring. Anchovies, regular canned tuna, and sardines. Salted nuts. Dairy Processed cheese and cheese  spreads. Cheese curds. Blue cheese and cottage cheese. Buttermilk.  Condiments Onion and garlic salt, seasoned salt, table salt, and sea salt. Canned and packaged gravies. Worcestershire sauce. Tartar sauce. Barbecue sauce. Teriyaki sauce. Soy sauce, including reduced sodium. Steak sauce. Fish sauce. Oyster sauce. Cocktail sauce. Horseradish. Regular ketchup and mustard. Meat flavorings and tenderizers. Bouillon cubes. Hot sauce. Tabasco sauce. Marinades. Taco seasonings. Relishes. Fats and Oils Regular salad dressings. Salted butter. Margarine. Ghee. Bacon fat.  Other Potato and tortilla chips. Corn chips and puffs. Salted popcorn and pretzels. Canned or dried soups. Pizza. Frozen entrees and pot pies.  The items listed above may not be a complete list of foods and beverages to avoid. Contact your dietitian for more information. Document Released: 07/06/2001 Document Revised: 01/19/2013 Document Reviewed: 11/18/2012 [facility] Patient Information 2015 [place], [place]. This information is not intended to replace advice given to you by your health care provider. Make sure you discuss any questions you have with your health care  provider. Low-Sodium Eating Plan Sodium raises blood pressure and causes water to be held in the body. Getting less sodium from food will help lower your blood pressure, reduce any swelling, and protect your heart, liver, and kidneys. We get sodium by adding salt (sodium chloride) to food. Most of our sodium comes from canned, boxed, and frozen foods. Restaurant foods, fast foods, and pizza are also very high in sodium. Even if you take medicine to lower your blood pressure or to reduce fluid in your body, getting less sodium from your food is important. WHAT IS MY PLAN? Most people should limit their sodium intake to 2,300 mg a day. Your health care provider recommends that you limit your sodium intake to __________ a day.  WHAT DO I NEED TO KNOW ABOUT THIS EATING PLAN? For the low-sodium eating plan, you will follow these general guidelines:  Choose foods with a % Daily Value for sodium of less than 5% (as listed on the food label).   Use salt-free seasonings or herbs instead of table salt or sea salt.   Check with your health care provider or pharmacist before using salt substitutes.   Eat fresh foods.  Eat more vegetables and fruits.  Limit canned vegetables. If you do use them, rinse them well to decrease the sodium.   Limit cheese to 1 oz (28 g) per day.   Eat lower-sodium products, often labeled as "lower sodium" or "no salt added."  Avoid foods that contain monosodium glutamate (MSG). MSG is sometimes added to [place] food and some canned foods.  Check food labels (Nutrition Facts labels) on foods to learn how much sodium is in one serving.  Eat more home-cooked food and less restaurant, buffet, and fast food.  When eating at a restaurant, ask that your food be prepared with less salt or none, if possible.  HOW DO I READ FOOD LABELS FOR SODIUM INFORMATION? The Nutrition Facts label lists the amount of sodium in one serving of the food. If you eat more than one  serving, you must multiply the listed amount of sodium by the number of servings. Food labels may also identify foods as:  Sodium free--Less than 5 mg in a serving.  Very low sodium--35 mg or less in a serving.  Low sodium--140 mg or less in a serving.  Light in sodium--50% less sodium in a serving. For example, if a food that usually has 300 mg of sodium is changed to become light in sodium, it will have 150 mg  of sodium.  Reduced sodium--25% less sodium in a serving. For example, if a food that usually has 400 mg of sodium is changed to reduced sodium, it will have 300 mg of sodium. WHAT FOODS CAN I EAT? Grains Low-sodium cereals, including oats, puffed wheat and rice, and shredded wheat cereals. Low-sodium crackers. Unsalted rice and pasta. Lower-sodium bread.  Vegetables Frozen or fresh vegetables. Low-sodium or reduced-sodium canned vegetables. Low-sodium or reduced-sodium tomato sauce and paste. Low-sodium or reduced-sodium tomato and vegetable juices.  Fruits Fresh, frozen, and canned fruit. Fruit juice.  Meat and Other Protein Products Low-sodium canned tuna and salmon. Fresh or frozen meat, poultry, seafood, and fish. Lamb. Unsalted nuts. Dried beans, peas, and lentils without added salt. Unsalted canned beans. Homemade soups without salt. Eggs.  Dairy Milk. Soy milk. Ricotta cheese. Low-sodium or reduced-sodium cheeses. Yogurt.  Condiments Fresh and dried herbs and spices. Salt-free seasonings. Onion and garlic powders. Low-sodium varieties of mustard and ketchup. Lemon juice.  Fats and Oils Reduced-sodium salad dressings. Unsalted butter.  Other Unsalted popcorn and pretzels.  The items listed above may not be a complete list of recommended foods or beverages. Contact your dietitian for more options. WHAT FOODS ARE NOT RECOMMENDED? Grains Instant hot cereals. Bread stuffing, pancake, and biscuit mixes. Croutons. Seasoned rice or pasta mixes. Noodle soup cups.  Boxed or frozen macaroni and cheese. Self-rising flour. Regular salted crackers. Vegetables Regular canned vegetables. Regular canned tomato sauce and paste. Regular tomato and vegetable juices. Frozen vegetables in sauces. Salted french fries. Olives. Angie Fava. Relishes. Sauerkraut. Salsa. Meat and Other Protein Products Salted, canned, smoked, spiced, or pickled meats, seafood, or fish. Bacon, ham, sausage, hot dogs, corned beef, chipped beef, and packaged luncheon meats. Salt pork. Jerky. Pickled herring. Anchovies, regular canned tuna, and sardines. Salted nuts. Dairy Processed cheese and cheese spreads. Cheese curds. Blue cheese and cottage cheese. Buttermilk.  Condiments Onion and garlic salt, seasoned salt, table salt, and sea salt. Canned and packaged gravies. Worcestershire sauce. Tartar sauce. Barbecue sauce. Teriyaki sauce. Soy sauce, including reduced sodium. Steak sauce. Fish sauce. Oyster sauce. Cocktail sauce. Horseradish. Regular ketchup and mustard. Meat flavorings and tenderizers. Bouillon cubes. Hot sauce. Tabasco sauce. Marinades. Taco seasonings. Relishes. Fats and Oils Regular salad dressings. Salted butter. Margarine. Ghee. Bacon fat.  Other Potato and tortilla chips. Corn chips and puffs. Salted popcorn and pretzels. Canned or dried soups. Pizza. Frozen entrees and pot pies.  The items listed above may not be a complete list of foods and beverages to avoid. Contact your dietitian for more information. Document Released: 07/06/2001 Document Revised: 01/19/2013 Document Reviewed: 11/18/2012 [facility] Patient Information 2015 [place], [place]. This information is not intended to replace advice given to you by your health care provider. Make sure you discuss any questions you have with your health care provider. Low-Sodium Eating Plan Sodium raises blood pressure and causes water to be held in the body. Getting less sodium from food will help lower your blood pressure,  reduce any swelling, and protect your heart, liver, and kidneys. We get sodium by adding salt (sodium chloride) to food. Most of our sodium comes from canned, boxed, and frozen foods. Restaurant foods, fast foods, and pizza are also very high in sodium. Even if you take medicine to lower your blood pressure or to reduce fluid in your body, getting less sodium from your food is important. WHAT IS MY PLAN? Most people should limit their sodium intake to 2,300 mg a day. Your health care provider recommends that you  limit your sodium intake to __________ a day.  WHAT DO I NEED TO KNOW ABOUT THIS EATING PLAN? For the low-sodium eating plan, you will follow these general guidelines:  Choose foods with a % Daily Value for sodium of less than 5% (as listed on the food label).   Use salt-free seasonings or herbs instead of table salt or sea salt.   Check with your health care provider or pharmacist before using salt substitutes.   Eat fresh foods.  Eat more vegetables and fruits.  Limit canned vegetables. If you do use them, rinse them well to decrease the sodium.   Limit cheese to 1 oz (28 g) per day.   Eat lower-sodium products, often labeled as "lower sodium" or "no salt added."  Avoid foods that contain monosodium glutamate (MSG). MSG is sometimes added to [place] food and some canned foods.  Check food labels (Nutrition Facts labels) on foods to learn how much sodium is in one serving.  Eat more home-cooked food and less restaurant, buffet, and fast food.  When eating at a restaurant, ask that your food be prepared with less salt or none, if possible.  HOW DO I READ FOOD LABELS FOR SODIUM INFORMATION? The Nutrition Facts label lists the amount of sodium in one serving of the food. If you eat more than one serving, you must multiply the listed amount of sodium by the number of servings. Food labels may also identify foods as:  Sodium free--Less than 5 mg in a serving.  Very  low sodium--35 mg or less in a serving.  Low sodium--140 mg or less in a serving.  Light in sodium--50% less sodium in a serving. For example, if a food that usually has 300 mg of sodium is changed to become light in sodium, it will have 150 mg of sodium.  Reduced sodium--25% less sodium in a serving. For example, if a food that usually has 400 mg of sodium is changed to reduced sodium, it will have 300 mg of sodium. WHAT FOODS CAN I EAT? Grains Low-sodium cereals, including oats, puffed wheat and rice, and shredded wheat cereals. Low-sodium crackers. Unsalted rice and pasta. Lower-sodium bread.  Vegetables Frozen or fresh vegetables. Low-sodium or reduced-sodium canned vegetables. Low-sodium or reduced-sodium tomato sauce and paste. Low-sodium or reduced-sodium tomato and vegetable juices.  Fruits Fresh, frozen, and canned fruit. Fruit juice.  Meat and Other Protein Products Low-sodium canned tuna and salmon. Fresh or frozen meat, poultry, seafood, and fish. Lamb. Unsalted nuts. Dried beans, peas, and lentils without added salt. Unsalted canned beans. Homemade soups without salt. Eggs.  Dairy Milk. Soy milk. Ricotta cheese. Low-sodium or reduced-sodium cheeses. Yogurt.  Condiments Fresh and dried herbs and spices. Salt-free seasonings. Onion and garlic powders. Low-sodium varieties of mustard and ketchup. Lemon juice.  Fats and Oils Reduced-sodium salad dressings. Unsalted butter.  Other Unsalted popcorn and pretzels.  The items listed above may not be a complete list of recommended foods or beverages. Contact your dietitian for more options. WHAT FOODS ARE NOT RECOMMENDED? Grains Instant hot cereals. Bread stuffing, pancake, and biscuit mixes. Croutons. Seasoned rice or pasta mixes. Noodle soup cups. Boxed or frozen macaroni and cheese. Self-rising flour. Regular salted crackers. Vegetables Regular canned vegetables. Regular canned tomato sauce and paste. Regular tomato and  vegetable juices. Frozen vegetables in sauces. Salted french fries. Olives. Angie Fava. Relishes. Sauerkraut. Salsa. Meat and Other Protein Products Salted, canned, smoked, spiced, or pickled meats, seafood, or fish. Bacon, ham, sausage, hot dogs, corned beef,  chipped beef, and packaged luncheon meats. Salt pork. Jerky. Pickled herring. Anchovies, regular canned tuna, and sardines. Salted nuts. Dairy Processed cheese and cheese spreads. Cheese curds. Blue cheese and cottage cheese. Buttermilk.  Condiments Onion and garlic salt, seasoned salt, table salt, and sea salt. Canned and packaged gravies. Worcestershire sauce. Tartar sauce. Barbecue sauce. Teriyaki sauce. Soy sauce, including reduced sodium. Steak sauce. Fish sauce. Oyster sauce. Cocktail sauce. Horseradish. Regular ketchup and mustard. Meat flavorings and tenderizers. Bouillon cubes. Hot sauce. Tabasco sauce. Marinades. Taco seasonings. Relishes. Fats and Oils Regular salad dressings. Salted butter. Margarine. Ghee. Bacon fat.  Other Potato and tortilla chips. Corn chips and puffs. Salted popcorn and pretzels. Canned or dried soups. Pizza. Frozen entrees and pot pies.  The items listed above may not be a complete list of foods and beverages to avoid. Contact your dietitian for more information. Document Released: 07/06/2001 Document Revised: 01/19/2013 Document Reviewed: 11/18/2012 [facility] Patient Information 2015 [place], [place]. This information is not intended to replace advice given to you by your health care provider. Make sure you discuss any questions you have with your health care provider. Low-Sodium Eating Plan Sodium raises blood pressure and causes water to be held in the body. Getting less sodium from food will help lower your blood pressure, reduce any swelling, and protect your heart, liver, and kidneys. We get sodium by adding salt (sodium chloride) to food. Most of our sodium comes from canned, boxed, and frozen foods.  Restaurant foods, fast foods, and pizza are also very high in sodium. Even if you take medicine to lower your blood pressure or to reduce fluid in your body, getting less sodium from your food is important. WHAT IS MY PLAN? Most people should limit their sodium intake to 2,300 mg a day. Your health care provider recommends that you limit your sodium intake to __________ a day.  WHAT DO I NEED TO KNOW ABOUT THIS EATING PLAN? For the low-sodium eating plan, you will follow these general guidelines:  Choose foods with a % Daily Value for sodium of less than 5% (as listed on the food label).   Use salt-free seasonings or herbs instead of table salt or sea salt.   Check with your health care provider or pharmacist before using salt substitutes.   Eat fresh foods.  Eat more vegetables and fruits.  Limit canned vegetables. If you do use them, rinse them well to decrease the sodium.   Limit cheese to 1 oz (28 g) per day.   Eat lower-sodium products, often labeled as "lower sodium" or "no salt added."  Avoid foods that contain monosodium glutamate (MSG). MSG is sometimes added to [place] food and some canned foods.  Check food labels (Nutrition Facts labels) on foods to learn how much sodium is in one serving.  Eat more home-cooked food and less restaurant, buffet, and fast food.  When eating at a restaurant, ask that your food be prepared with less salt or none, if possible.  HOW DO I READ FOOD LABELS FOR SODIUM INFORMATION? The Nutrition Facts label lists the amount of sodium in one serving of the food. If you eat more than one serving, you must multiply the listed amount of sodium by the number of servings. Food labels may also identify foods as:  Sodium free--Less than 5 mg in a serving.  Very low sodium--35 mg or less in a serving.  Low sodium--140 mg or less in a serving.  Light in sodium--50% less sodium in a serving. For  example, if a food that usually has 300 mg of  sodium is changed to become light in sodium, it will have 150 mg of sodium.  Reduced sodium--25% less sodium in a serving. For example, if a food that usually has 400 mg of sodium is changed to reduced sodium, it will have 300 mg of sodium. WHAT FOODS CAN I EAT? Grains Low-sodium cereals, including oats, puffed wheat and rice, and shredded wheat cereals. Low-sodium crackers. Unsalted rice and pasta. Lower-sodium bread.  Vegetables Frozen or fresh vegetables. Low-sodium or reduced-sodium canned vegetables. Low-sodium or reduced-sodium tomato sauce and paste. Low-sodium or reduced-sodium tomato and vegetable juices.  Fruits Fresh, frozen, and canned fruit. Fruit juice.  Meat and Other Protein Products Low-sodium canned tuna and salmon. Fresh or frozen meat, poultry, seafood, and fish. Lamb. Unsalted nuts. Dried beans, peas, and lentils without added salt. Unsalted canned beans. Homemade soups without salt. Eggs.  Dairy Milk. Soy milk. Ricotta cheese. Low-sodium or reduced-sodium cheeses. Yogurt.  Condiments Fresh and dried herbs and spices. Salt-free seasonings. Onion and garlic powders. Low-sodium varieties of mustard and ketchup. Lemon juice.  Fats and Oils Reduced-sodium salad dressings. Unsalted butter.  Other Unsalted popcorn and pretzels.  The items listed above may not be a complete list of recommended foods or beverages. Contact your dietitian for more options. WHAT FOODS ARE NOT RECOMMENDED? Grains Instant hot cereals. Bread stuffing, pancake, and biscuit mixes. Croutons. Seasoned rice or pasta mixes. Noodle soup cups. Boxed or frozen macaroni and cheese. Self-rising flour. Regular salted crackers. Vegetables Regular canned vegetables. Regular canned tomato sauce and paste. Regular tomato and vegetable juices. Frozen vegetables in sauces. Salted french fries. Olives. Angie Fava. Relishes. Sauerkraut. Salsa. Meat and Other Protein Products Salted, canned, smoked, spiced, or  pickled meats, seafood, or fish. Bacon, ham, sausage, hot dogs, corned beef, chipped beef, and packaged luncheon meats. Salt pork. Jerky. Pickled herring. Anchovies, regular canned tuna, and sardines. Salted nuts. Dairy Processed cheese and cheese spreads. Cheese curds. Blue cheese and cottage cheese. Buttermilk.  Condiments Onion and garlic salt, seasoned salt, table salt, and sea salt. Canned and packaged gravies. Worcestershire sauce. Tartar sauce. Barbecue sauce. Teriyaki sauce. Soy sauce, including reduced sodium. Steak sauce. Fish sauce. Oyster sauce. Cocktail sauce. Horseradish. Regular ketchup and mustard. Meat flavorings and tenderizers. Bouillon cubes. Hot sauce. Tabasco sauce. Marinades. Taco seasonings. Relishes. Fats and Oils Regular salad dressings. Salted butter. Margarine. Ghee. Bacon fat.  Other Potato and tortilla chips. Corn chips and puffs. Salted popcorn and pretzels. Canned or dried soups. Pizza. Frozen entrees and pot pies.  The items listed above may not be a complete list of foods and beverages to avoid. Contact your dietitian for more information. Document Released: 07/06/2001 Document Revised: 01/19/2013 Document Reviewed: 11/18/2012 [facility] Patient Information 2015 [place], [place]. This information is not intended to replace advice given to you by your health care provider. Make sure you discuss any questions you have with your health care provider. Low-Sodium Eating Plan Sodium raises blood pressure and causes water to be held in the body. Getting less sodium from food will help lower your blood pressure, reduce any swelling, and protect your heart, liver, and kidneys. We get sodium by adding salt (sodium chloride) to food. Most of our sodium comes from canned, boxed, and frozen foods. Restaurant foods, fast foods, and pizza are also very high in sodium. Even if you take medicine to lower your blood pressure or to reduce fluid in your body, getting less sodium from  your food is  important. WHAT IS MY PLAN? Most people should limit their sodium intake to 2,300 mg a day. Your health care provider recommends that you limit your sodium intake to __________ a day.  WHAT DO I NEED TO KNOW ABOUT THIS EATING PLAN? For the low-sodium eating plan, you will follow these general guidelines:  Choose foods with a % Daily Value for sodium of less than 5% (as listed on the food label).   Use salt-free seasonings or herbs instead of table salt or sea salt.   Check with your health care provider or pharmacist before using salt substitutes.   Eat fresh foods.  Eat more vegetables and fruits.  Limit canned vegetables. If you do use them, rinse them well to decrease the sodium.   Limit cheese to 1 oz (28 g) per day.   Eat lower-sodium products, often labeled as "lower sodium" or "no salt added."  Avoid foods that contain monosodium glutamate (MSG). MSG is sometimes added to [place] food and some canned foods.  Check food labels (Nutrition Facts labels) on foods to learn how much sodium is in one serving.  Eat more home-cooked food and less restaurant, buffet, and fast food.  When eating at a restaurant, ask that your food be prepared with less salt or none, if possible.  HOW DO I READ FOOD LABELS FOR SODIUM INFORMATION? The Nutrition Facts label lists the amount of sodium in one serving of the food. If you eat more than one serving, you must multiply the listed amount of sodium by the number of servings. Food labels may also identify foods as:  Sodium free--Less than 5 mg in a serving.  Very low sodium--35 mg or less in a serving.  Low sodium--140 mg or less in a serving.  Light in sodium--50% less sodium in a serving. For example, if a food that usually has 300 mg of sodium is changed to become light in sodium, it will have 150 mg of sodium.  Reduced sodium--25% less sodium in a serving. For example, if a food that usually has 400 mg of sodium is  changed to reduced sodium, it will have 300 mg of sodium. WHAT FOODS CAN I EAT? Grains Low-sodium cereals, including oats, puffed wheat and rice, and shredded wheat cereals. Low-sodium crackers. Unsalted rice and pasta. Lower-sodium bread.  Vegetables Frozen or fresh vegetables. Low-sodium or reduced-sodium canned vegetables. Low-sodium or reduced-sodium tomato sauce and paste. Low-sodium or reduced-sodium tomato and vegetable juices.  Fruits Fresh, frozen, and canned fruit. Fruit juice.  Meat and Other Protein Products Low-sodium canned tuna and salmon. Fresh or frozen meat, poultry, seafood, and fish. Lamb. Unsalted nuts. Dried beans, peas, and lentils without added salt. Unsalted canned beans. Homemade soups without salt. Eggs.  Dairy Milk. Soy milk. Ricotta cheese. Low-sodium or reduced-sodium cheeses. Yogurt.  Condiments Fresh and dried herbs and spices. Salt-free seasonings. Onion and garlic powders. Low-sodium varieties of mustard and ketchup. Lemon juice.  Fats and Oils Reduced-sodium salad dressings. Unsalted butter.  Other Unsalted popcorn and pretzels.  The items listed above may not be a complete list of recommended foods or beverages. Contact your dietitian for more options. WHAT FOODS ARE NOT RECOMMENDED? Grains Instant hot cereals. Bread stuffing, pancake, and biscuit mixes. Croutons. Seasoned rice or pasta mixes. Noodle soup cups. Boxed or frozen macaroni and cheese. Self-rising flour. Regular salted crackers. Vegetables Regular canned vegetables. Regular canned tomato sauce and paste. Regular tomato and vegetable juices. Frozen vegetables in sauces. Salted french fries. Olives. Angie Fava. Relishes. Sauerkraut.  Salsa. Meat and Other Protein Products Salted, canned, smoked, spiced, or pickled meats, seafood, or fish. Bacon, ham, sausage, hot dogs, corned beef, chipped beef, and packaged luncheon meats. Salt pork. Jerky. Pickled herring. Anchovies, regular canned  tuna, and sardines. Salted nuts. Dairy Processed cheese and cheese spreads. Cheese curds. Blue cheese and cottage cheese. Buttermilk.  Condiments Onion and garlic salt, seasoned salt, table salt, and sea salt. Canned and packaged gravies. Worcestershire sauce. Tartar sauce. Barbecue sauce. Teriyaki sauce. Soy sauce, including reduced sodium. Steak sauce. Fish sauce. Oyster sauce. Cocktail sauce. Horseradish. Regular ketchup and mustard. Meat flavorings and tenderizers. Bouillon cubes. Hot sauce. Tabasco sauce. Marinades. Taco seasonings. Relishes. Fats and Oils Regular salad dressings. Salted butter. Margarine. Ghee. Bacon fat.  Other Potato and tortilla chips. Corn chips and puffs. Salted popcorn and pretzels. Canned or dried soups. Pizza. Frozen entrees and pot pies.  The items listed above may not be a complete list of foods and beverages to avoid. Contact your dietitian for more information. Document Released: 07/06/2001 Document Revised: 01/19/2013 Document Reviewed: 11/18/2012 [facility] Patient Information 2015 [place], [place]. This information is not intended to replace advice given to you by your health care provider. Make sure you discuss any questions you have with your health care provider. Low-Sodium Eating Plan Sodium raises blood pressure and causes water to be held in the body. Getting less sodium from food will help lower your blood pressure, reduce any swelling, and protect your heart, liver, and kidneys. We get sodium by adding salt (sodium chloride) to food. Most of our sodium comes from canned, boxed, and frozen foods. Restaurant foods, fast foods, and pizza are also very high in sodium. Even if you take medicine to lower your blood pressure or to reduce fluid in your body, getting less sodium from your food is important. WHAT IS MY PLAN? Most people should limit their sodium intake to 2,300 mg a day. Your health care provider recommends that you limit your sodium intake to  __________ a day.  WHAT DO I NEED TO KNOW ABOUT THIS EATING PLAN? For the low-sodium eating plan, you will follow these general guidelines:  Choose foods with a % Daily Value for sodium of less than 5% (as listed on the food label).   Use salt-free seasonings or herbs instead of table salt or sea salt.   Check with your health care provider or pharmacist before using salt substitutes.   Eat fresh foods.  Eat more vegetables and fruits.  Limit canned vegetables. If you do use them, rinse them well to decrease the sodium.   Limit cheese to 1 oz (28 g) per day.   Eat lower-sodium products, often labeled as "lower sodium" or "no salt added."  Avoid foods that contain monosodium glutamate (MSG). MSG is sometimes added to [place] food and some canned foods.  Check food labels (Nutrition Facts labels) on foods to learn how much sodium is in one serving.  Eat more home-cooked food and less restaurant, buffet, and fast food.  When eating at a restaurant, ask that your food be prepared with less salt or none, if possible.  HOW DO I READ FOOD LABELS FOR SODIUM INFORMATION? The Nutrition Facts label lists the amount of sodium in one serving of the food. If you eat more than one serving, you must multiply the listed amount of sodium by the number of servings. Food labels may also identify foods as:  Sodium free--Less than 5 mg in a serving.  Very low sodium--35 mg  or less in a serving.  Low sodium--140 mg or less in a serving.  Light in sodium--50% less sodium in a serving. For example, if a food that usually has 300 mg of sodium is changed to become light in sodium, it will have 150 mg of sodium.  Reduced sodium--25% less sodium in a serving. For example, if a food that usually has 400 mg of sodium is changed to reduced sodium, it will have 300 mg of sodium. WHAT FOODS CAN I EAT? Grains Low-sodium cereals, including oats, puffed wheat and rice, and shredded wheat cereals.  Low-sodium crackers. Unsalted rice and pasta. Lower-sodium bread.  Vegetables Frozen or fresh vegetables. Low-sodium or reduced-sodium canned vegetables. Low-sodium or reduced-sodium tomato sauce and paste. Low-sodium or reduced-sodium tomato and vegetable juices.  Fruits Fresh, frozen, and canned fruit. Fruit juice.  Meat and Other Protein Products Low-sodium canned tuna and salmon. Fresh or frozen meat, poultry, seafood, and fish. Lamb. Unsalted nuts. Dried beans, peas, and lentils without added salt. Unsalted canned beans. Homemade soups without salt. Eggs.  Dairy Milk. Soy milk. Ricotta cheese. Low-sodium or reduced-sodium cheeses. Yogurt.  Condiments Fresh and dried herbs and spices. Salt-free seasonings. Onion and garlic powders. Low-sodium varieties of mustard and ketchup. Lemon juice.  Fats and Oils Reduced-sodium salad dressings. Unsalted butter.  Other Unsalted popcorn and pretzels.  The items listed above may not be a complete list of recommended foods or beverages. Contact your dietitian for more options. WHAT FOODS ARE NOT RECOMMENDED? Grains Instant hot cereals. Bread stuffing, pancake, and biscuit mixes. Croutons. Seasoned rice or pasta mixes. Noodle soup cups. Boxed or frozen macaroni and cheese. Self-rising flour. Regular salted crackers. Vegetables Regular canned vegetables. Regular canned tomato sauce and paste. Regular tomato and vegetable juices. Frozen vegetables in sauces. Salted french fries. Olives. Angie Fava. Relishes. Sauerkraut. Salsa. Meat and Other Protein Products Salted, canned, smoked, spiced, or pickled meats, seafood, or fish. Bacon, ham, sausage, hot dogs, corned beef, chipped beef, and packaged luncheon meats. Salt pork. Jerky. Pickled herring. Anchovies, regular canned tuna, and sardines. Salted nuts. Dairy Processed cheese and cheese spreads. Cheese curds. Blue cheese and cottage cheese. Buttermilk.  Condiments Onion and garlic salt,  seasoned salt, table salt, and sea salt. Canned and packaged gravies. Worcestershire sauce. Tartar sauce. Barbecue sauce. Teriyaki sauce. Soy sauce, including reduced sodium. Steak sauce. Fish sauce. Oyster sauce. Cocktail sauce. Horseradish. Regular ketchup and mustard. Meat flavorings and tenderizers. Bouillon cubes. Hot sauce. Tabasco sauce. Marinades. Taco seasonings. Relishes. Fats and Oils Regular salad dressings. Salted butter. Margarine. Ghee. Bacon fat.  Other Potato and tortilla chips. Corn chips and puffs. Salted popcorn and pretzels. Canned or dried soups. Pizza. Frozen entrees and pot pies.  The items listed above may not be a complete list of foods and beverages to avoid. Contact your dietitian for more information. Document Released: 07/06/2001 Document Revised: 01/19/2013 Document Reviewed: 11/18/2012 [facility] Patient Information 2015 [place], [place]. This information is not intended to replace advice given to you by your health care provider. Make sure you discuss any questions you have with your health care provider. Low-Sodium Eating Plan Sodium raises blood pressure and causes water to be held in the body. Getting less sodium from food will help lower your blood pressure, reduce any swelling, and protect your heart, liver, and kidneys. We get sodium by adding salt (sodium chloride) to food. Most of our sodium comes from canned, boxed, and frozen foods. Restaurant foods, fast foods, and pizza are also very high in sodium. Even  if you take medicine to lower your blood pressure or to reduce fluid in your body, getting less sodium from your food is important. WHAT IS MY PLAN? Most people should limit their sodium intake to 2,300 mg a day. Your health care provider recommends that you limit your sodium intake to __________ a day.  WHAT DO I NEED TO KNOW ABOUT THIS EATING PLAN? For the low-sodium eating plan, you will follow these general guidelines:  Choose foods with a % Daily  Value for sodium of less than 5% (as listed on the food label).   Use salt-free seasonings or herbs instead of table salt or sea salt.   Check with your health care provider or pharmacist before using salt substitutes.   Eat fresh foods.  Eat more vegetables and fruits.  Limit canned vegetables. If you do use them, rinse them well to decrease the sodium.   Limit cheese to 1 oz (28 g) per day.   Eat lower-sodium products, often labeled as "lower sodium" or "no salt added."  Avoid foods that contain monosodium glutamate (MSG). MSG is sometimes added to [place] food and some canned foods.  Check food labels (Nutrition Facts labels) on foods to learn how much sodium is in one serving.  Eat more home-cooked food and less restaurant, buffet, and fast food.  When eating at a restaurant, ask that your food be prepared with less salt or none, if possible.  HOW DO I READ FOOD LABELS FOR SODIUM INFORMATION? The Nutrition Facts label lists the amount of sodium in one serving of the food. If you eat more than one serving, you must multiply the listed amount of sodium by the number of servings. Food labels may also identify foods as:  Sodium free--Less than 5 mg in a serving.  Very low sodium--35 mg or less in a serving.  Low sodium--140 mg or less in a serving.  Light in sodium--50% less sodium in a serving. For example, if a food that usually has 300 mg of sodium is changed to become light in sodium, it will have 150 mg of sodium.  Reduced sodium--25% less sodium in a serving. For example, if a food that usually has 400 mg of sodium is changed to reduced sodium, it will have 300 mg of sodium. WHAT FOODS CAN I EAT? Grains Low-sodium cereals, including oats, puffed wheat and rice, and shredded wheat cereals. Low-sodium crackers. Unsalted rice and pasta. Lower-sodium bread.  Vegetables Frozen or fresh vegetables. Low-sodium or reduced-sodium canned vegetables. Low-sodium or  reduced-sodium tomato sauce and paste. Low-sodium or reduced-sodium tomato and vegetable juices.  Fruits Fresh, frozen, and canned fruit. Fruit juice.  Meat and Other Protein Products Low-sodium canned tuna and salmon. Fresh or frozen meat, poultry, seafood, and fish. Lamb. Unsalted nuts. Dried beans, peas, and lentils without added salt. Unsalted canned beans. Homemade soups without salt. Eggs.  Dairy Milk. Soy milk. Ricotta cheese. Low-sodium or reduced-sodium cheeses. Yogurt.  Condiments Fresh and dried herbs and spices. Salt-free seasonings. Onion and garlic powders. Low-sodium varieties of mustard and ketchup. Lemon juice.  Fats and Oils Reduced-sodium salad dressings. Unsalted butter.  Other Unsalted popcorn and pretzels.  The items listed above may not be a complete list of recommended foods or beverages. Contact your dietitian for more options. WHAT FOODS ARE NOT RECOMMENDED? Grains Instant hot cereals. Bread stuffing, pancake, and biscuit mixes. Croutons. Seasoned rice or pasta mixes. Noodle soup cups. Boxed or frozen macaroni and cheese. Self-rising flour. Regular salted crackers. Vegetables Regular  canned vegetables. Regular canned tomato sauce and paste. Regular tomato and vegetable juices. Frozen vegetables in sauces. Salted french fries. Olives. Angie Fava. Relishes. Sauerkraut. Salsa. Meat and Other Protein Products Salted, canned, smoked, spiced, or pickled meats, seafood, or fish. Bacon, ham, sausage, hot dogs, corned beef, chipped beef, and packaged luncheon meats. Salt pork. Jerky. Pickled herring. Anchovies, regular canned tuna, and sardines. Salted nuts. Dairy Processed cheese and cheese spreads. Cheese curds. Blue cheese and cottage cheese. Buttermilk.  Condiments Onion and garlic salt, seasoned salt, table salt, and sea salt. Canned and packaged gravies. Worcestershire sauce. Tartar sauce. Barbecue sauce. Teriyaki sauce. Soy sauce, including reduced sodium.  Steak sauce. Fish sauce. Oyster sauce. Cocktail sauce. Horseradish. Regular ketchup and mustard. Meat flavorings and tenderizers. Bouillon cubes. Hot sauce. Tabasco sauce. Marinades. Taco seasonings. Relishes. Fats and Oils Regular salad dressings. Salted butter. Margarine. Ghee. Bacon fat.  Other Potato and tortilla chips. Corn chips and puffs. Salted popcorn and pretzels. Canned or dried soups. Pizza. Frozen entrees and pot pies.  The items listed above may not be a complete list of foods and beverages to avoid. Contact your dietitian for more information. Document Released: 07/06/2001 Document Revised: 01/19/2013 Document Reviewed: 11/18/2012 [facility] Patient Information 2015 [place], [place]. This information is not intended to replace advice given to you by your health care provider. Make sure you discuss any questions you have with your health care provider. Low-Sodium Eating Plan Sodium raises blood pressure and causes water to be held in the body. Getting less sodium from food will help lower your blood pressure, reduce any swelling, and protect your heart, liver, and kidneys. We get sodium by adding salt (sodium chloride) to food. Most of our sodium comes from canned, boxed, and frozen foods. Restaurant foods, fast foods, and pizza are also very high in sodium. Even if you take medicine to lower your blood pressure or to reduce fluid in your body, getting less sodium from your food is important. WHAT IS MY PLAN? Most people should limit their sodium intake to 2,300 mg a day. Your health care provider recommends that you limit your sodium intake to __________ a day.  WHAT DO I NEED TO KNOW ABOUT THIS EATING PLAN? For the low-sodium eating plan, you will follow these general guidelines:  Choose foods with a % Daily Value for sodium of less than 5% (as listed on the food label).   Use salt-free seasonings or herbs instead of table salt or sea salt.   Check with your health care provider  or pharmacist before using salt substitutes.   Eat fresh foods.  Eat more vegetables and fruits.  Limit canned vegetables. If you do use them, rinse them well to decrease the sodium.   Limit cheese to 1 oz (28 g) per day.   Eat lower-sodium products, often labeled as "lower sodium" or "no salt added."  Avoid foods that contain monosodium glutamate (MSG). MSG is sometimes added to [place] food and some canned foods.  Check food labels (Nutrition Facts labels) on foods to learn how much sodium is in one serving.  Eat more home-cooked food and less restaurant, buffet, and fast food.  When eating at a restaurant, ask that your food be prepared with less salt or none, if possible.  HOW DO I READ FOOD LABELS FOR SODIUM INFORMATION? The Nutrition Facts label lists the amount of sodium in one serving of the food. If you eat more than one serving, you must multiply the listed amount of sodium by the  number of servings. Food labels may also identify foods as:  Sodium free--Less than 5 mg in a serving.  Very low sodium--35 mg or less in a serving.  Low sodium--140 mg or less in a serving.  Light in sodium--50% less sodium in a serving. For example, if a food that usually has 300 mg of sodium is changed to become light in sodium, it will have 150 mg of sodium.  Reduced sodium--25% less sodium in a serving. For example, if a food that usually has 400 mg of sodium is changed to reduced sodium, it will have 300 mg of sodium. WHAT FOODS CAN I EAT? Grains Low-sodium cereals, including oats, puffed wheat and rice, and shredded wheat cereals. Low-sodium crackers. Unsalted rice and pasta. Lower-sodium bread.  Vegetables Frozen or fresh vegetables. Low-sodium or reduced-sodium canned vegetables. Low-sodium or reduced-sodium tomato sauce and paste. Low-sodium or reduced-sodium tomato and vegetable juices.  Fruits Fresh, frozen, and canned fruit. Fruit juice.  Meat and Other Protein  Products Low-sodium canned tuna and salmon. Fresh or frozen meat, poultry, seafood, and fish. Lamb. Unsalted nuts. Dried beans, peas, and lentils without added salt. Unsalted canned beans. Homemade soups without salt. Eggs.  Dairy Milk. Soy milk. Ricotta cheese. Low-sodium or reduced-sodium cheeses. Yogurt.  Condiments Fresh and dried herbs and spices. Salt-free seasonings. Onion and garlic powders. Low-sodium varieties of mustard and ketchup. Lemon juice.  Fats and Oils Reduced-sodium salad dressings. Unsalted butter.  Other Unsalted popcorn and pretzels.  The items listed above may not be a complete list of recommended foods or beverages. Contact your dietitian for more options. WHAT FOODS ARE NOT RECOMMENDED? Grains Instant hot cereals. Bread stuffing, pancake, and biscuit mixes. Croutons. Seasoned rice or pasta mixes. Noodle soup cups. Boxed or frozen macaroni and cheese. Self-rising flour. Regular salted crackers. Vegetables Regular canned vegetables. Regular canned tomato sauce and paste. Regular tomato and vegetable juices. Frozen vegetables in sauces. Salted french fries. Olives. Angie Fava. Relishes. Sauerkraut. Salsa. Meat and Other Protein Products Salted, canned, smoked, spiced, or pickled meats, seafood, or fish. Bacon, ham, sausage, hot dogs, corned beef, chipped beef, and packaged luncheon meats. Salt pork. Jerky. Pickled herring. Anchovies, regular canned tuna, and sardines. Salted nuts. Dairy Processed cheese and cheese spreads. Cheese curds. Blue cheese and cottage cheese. Buttermilk.  Condiments Onion and garlic salt, seasoned salt, table salt, and sea salt. Canned and packaged gravies. Worcestershire sauce. Tartar sauce. Barbecue sauce. Teriyaki sauce. Soy sauce, including reduced sodium. Steak sauce. Fish sauce. Oyster sauce. Cocktail sauce. Horseradish. Regular ketchup and mustard. Meat flavorings and tenderizers. Bouillon cubes. Hot sauce. Tabasco sauce.  Marinades. Taco seasonings. Relishes. Fats and Oils Regular salad dressings. Salted butter. Margarine. Ghee. Bacon fat.  Other Potato and tortilla chips. Corn chips and puffs. Salted popcorn and pretzels. Canned or dried soups. Pizza. Frozen entrees and pot pies.  The items listed above may not be a complete list of foods and beverages to avoid. Contact your dietitian for more information. Document Released: 07/06/2001 Document Revised: 01/19/2013 Document Reviewed: 11/18/2012 [facility] Patient Information 2015 [place], [place]. This information is not intended to replace advice given to you by your health care provider. Make sure you discuss any questions you have with your health care provider. Low-Sodium Eating Plan Sodium raises blood pressure and causes water to be held in the body. Getting less sodium from food will help lower your blood pressure, reduce any swelling, and protect your heart, liver, and kidneys. We get sodium by adding salt (sodium chloride) to food.  Most of our sodium comes from canned, boxed, and frozen foods. Restaurant foods, fast foods, and pizza are also very high in sodium. Even if you take medicine to lower your blood pressure or to reduce fluid in your body, getting less sodium from your food is important. WHAT IS MY PLAN? Most people should limit their sodium intake to 2,300 mg a day. Your health care provider recommends that you limit your sodium intake to __________ a day.  WHAT DO I NEED TO KNOW ABOUT THIS EATING PLAN? For the low-sodium eating plan, you will follow these general guidelines:  Choose foods with a % Daily Value for sodium of less than 5% (as listed on the food label).   Use salt-free seasonings or herbs instead of table salt or sea salt.   Check with your health care provider or pharmacist before using salt substitutes.   Eat fresh foods.  Eat more vegetables and fruits.  Limit canned vegetables. If you do use them, rinse them well to  decrease the sodium.   Limit cheese to 1 oz (28 g) per day.   Eat lower-sodium products, often labeled as "lower sodium" or "no salt added."  Avoid foods that contain monosodium glutamate (MSG). MSG is sometimes added to [place] food and some canned foods.  Check food labels (Nutrition Facts labels) on foods to learn how much sodium is in one serving.  Eat more home-cooked food and less restaurant, buffet, and fast food.  When eating at a restaurant, ask that your food be prepared with less salt or none, if possible.  HOW DO I READ FOOD LABELS FOR SODIUM INFORMATION? The Nutrition Facts label lists the amount of sodium in one serving of the food. If you eat more than one serving, you must multiply the listed amount of sodium by the number of servings. Food labels may also identify foods as:  Sodium free--Less than 5 mg in a serving.  Very low sodium--35 mg or less in a serving.  Low sodium--140 mg or less in a serving.  Light in sodium--50% less sodium in a serving. For example, if a food that usually has 300 mg of sodium is changed to become light in sodium, it will have 150 mg of sodium.  Reduced sodium--25% less sodium in a serving. For example, if a food that usually has 400 mg of sodium is changed to reduced sodium, it will have 300 mg of sodium. WHAT FOODS CAN I EAT? Grains Low-sodium cereals, including oats, puffed wheat and rice, and shredded wheat cereals. Low-sodium crackers. Unsalted rice and pasta. Lower-sodium bread.  Vegetables Frozen or fresh vegetables. Low-sodium or reduced-sodium canned vegetables. Low-sodium or reduced-sodium tomato sauce and paste. Low-sodium or reduced-sodium tomato and vegetable juices.  Fruits Fresh, frozen, and canned fruit. Fruit juice.  Meat and Other Protein Products Low-sodium canned tuna and salmon. Fresh or frozen meat, poultry, seafood, and fish. Lamb. Unsalted nuts. Dried beans, peas, and lentils without added salt.  Unsalted canned beans. Homemade soups without salt. Eggs.  Dairy Milk. Soy milk. Ricotta cheese. Low-sodium or reduced-sodium cheeses. Yogurt.  Condiments Fresh and dried herbs and spices. Salt-free seasonings. Onion and garlic powders. Low-sodium varieties of mustard and ketchup. Lemon juice.  Fats and Oils Reduced-sodium salad dressings. Unsalted butter.  Other Unsalted popcorn and pretzels.  The items listed above may not be a complete list of recommended foods or beverages. Contact your dietitian for more options. WHAT FOODS ARE NOT RECOMMENDED? Grains Instant hot cereals. Bread stuffing, pancake, and  biscuit mixes. Croutons. Seasoned rice or pasta mixes. Noodle soup cups. Boxed or frozen macaroni and cheese. Self-rising flour. Regular salted crackers. Vegetables Regular canned vegetables. Regular canned tomato sauce and paste. Regular tomato and vegetable juices. Frozen vegetables in sauces. Salted french fries. Olives. Angie Fava. Relishes. Sauerkraut. Salsa. Meat and Other Protein Products Salted, canned, smoked, spiced, or pickled meats, seafood, or fish. Bacon, ham, sausage, hot dogs, corned beef, chipped beef, and packaged luncheon meats. Salt pork. Jerky. Pickled herring. Anchovies, regular canned tuna, and sardines. Salted nuts. Dairy Processed cheese and cheese spreads. Cheese curds. Blue cheese and cottage cheese. Buttermilk.  Condiments Onion and garlic salt, seasoned salt, table salt, and sea salt. Canned and packaged gravies. Worcestershire sauce. Tartar sauce. Barbecue sauce. Teriyaki sauce. Soy sauce, including reduced sodium. Steak sauce. Fish sauce. Oyster sauce. Cocktail sauce. Horseradish. Regular ketchup and mustard. Meat flavorings and tenderizers. Bouillon cubes. Hot sauce. Tabasco sauce. Marinades. Taco seasonings. Relishes. Fats and Oils Regular salad dressings. Salted butter. Margarine. Ghee. Bacon fat.  Other Potato and tortilla chips. Corn chips and  puffs. Salted popcorn and pretzels. Canned or dried soups. Pizza. Frozen entrees and pot pies.  The items listed above may not be a complete list of foods and beverages to avoid. Contact your dietitian for more information. Document Released: 07/06/2001 Document Revised: 01/19/2013 Document Reviewed: 11/18/2012 [facility] Patient Information 2015 [place], [place]. This information is not intended to replace advice given to you by your health care provider. Make sure you discuss any questions you have with your health care provider. Low-Sodium Eating Plan Sodium raises blood pressure and causes water to be held in the body. Getting less sodium from food will help lower your blood pressure, reduce any swelling, and protect your heart, liver, and kidneys. We get sodium by adding salt (sodium chloride) to food. Most of our sodium comes from canned, boxed, and frozen foods. Restaurant foods, fast foods, and pizza are also very high in sodium. Even if you take medicine to lower your blood pressure or to reduce fluid in your body, getting less sodium from your food is important. WHAT IS MY PLAN? Most people should limit their sodium intake to 2,300 mg a day. Your health care provider recommends that you limit your sodium intake to __________ a day.  WHAT DO I NEED TO KNOW ABOUT THIS EATING PLAN? For the low-sodium eating plan, you will follow these general guidelines:  Choose foods with a % Daily Value for sodium of less than 5% (as listed on the food label).   Use salt-free seasonings or herbs instead of table salt or sea salt.   Check with your health care provider or pharmacist before using salt substitutes.   Eat fresh foods.  Eat more vegetables and fruits.  Limit canned vegetables. If you do use them, rinse them well to decrease the sodium.   Limit cheese to 1 oz (28 g) per day.   Eat lower-sodium products, often labeled as "lower sodium" or "no salt added."  Avoid foods that contain  monosodium glutamate (MSG). MSG is sometimes added to [place] food and some canned foods.  Check food labels (Nutrition Facts labels) on foods to learn how much sodium is in one serving.  Eat more home-cooked food and less restaurant, buffet, and fast food.  When eating at a restaurant, ask that your food be prepared with less salt or none, if possible.  HOW DO I READ FOOD LABELS FOR SODIUM INFORMATION? The Nutrition Facts label lists the amount of  sodium in one serving of the food. If you eat more than one serving, you must multiply the listed amount of sodium by the number of servings. Food labels may also identify foods as:  Sodium free--Less than 5 mg in a serving.  Very low sodium--35 mg or less in a serving.  Low sodium--140 mg or less in a serving.  Light in sodium--50% less sodium in a serving. For example, if a food that usually has 300 mg of sodium is changed to become light in sodium, it will have 150 mg of sodium.  Reduced sodium--25% less sodium in a serving. For example, if a food that usually has 400 mg of sodium is changed to reduced sodium, it will have 300 mg of sodium. WHAT FOODS CAN I EAT? Grains Low-sodium cereals, including oats, puffed wheat and rice, and shredded wheat cereals. Low-sodium crackers. Unsalted rice and pasta. Lower-sodium bread.  Vegetables Frozen or fresh vegetables. Low-sodium or reduced-sodium canned vegetables. Low-sodium or reduced-sodium tomato sauce and paste. Low-sodium or reduced-sodium tomato and vegetable juices.  Fruits Fresh, frozen, and canned fruit. Fruit juice.  Meat and Other Protein Products Low-sodium canned tuna and salmon. Fresh or frozen meat, poultry, seafood, and fish. Lamb. Unsalted nuts. Dried beans, peas, and lentils without added salt. Unsalted canned beans. Homemade soups without salt. Eggs.  Dairy Milk. Soy milk. Ricotta cheese. Low-sodium or reduced-sodium cheeses. Yogurt.  Condiments Fresh and dried herbs  and spices. Salt-free seasonings. Onion and garlic powders. Low-sodium varieties of mustard and ketchup. Lemon juice.  Fats and Oils Reduced-sodium salad dressings. Unsalted butter.  Other Unsalted popcorn and pretzels.  The items listed above may not be a complete list of recommended foods or beverages. Contact your dietitian for more options. WHAT FOODS ARE NOT RECOMMENDED? Grains Instant hot cereals. Bread stuffing, pancake, and biscuit mixes. Croutons. Seasoned rice or pasta mixes. Noodle soup cups. Boxed or frozen macaroni and cheese. Self-rising flour. Regular salted crackers. Vegetables Regular canned vegetables. Regular canned tomato sauce and paste. Regular tomato and vegetable juices. Frozen vegetables in sauces. Salted french fries. Olives. Angie Fava. Relishes. Sauerkraut. Salsa. Meat and Other Protein Products Salted, canned, smoked, spiced, or pickled meats, seafood, or fish. Bacon, ham, sausage, hot dogs, corned beef, chipped beef, and packaged luncheon meats. Salt pork. Jerky. Pickled herring. Anchovies, regular canned tuna, and sardines. Salted nuts. Dairy Processed cheese and cheese spreads. Cheese curds. Blue cheese and cottage cheese. Buttermilk.  Condiments Onion and garlic salt, seasoned salt, table salt, and sea salt. Canned and packaged gravies. Worcestershire sauce. Tartar sauce. Barbecue sauce. Teriyaki sauce. Soy sauce, including reduced sodium. Steak sauce. Fish sauce. Oyster sauce. Cocktail sauce. Horseradish. Regular ketchup and mustard. Meat flavorings and tenderizers. Bouillon cubes. Hot sauce. Tabasco sauce. Marinades. Taco seasonings. Relishes. Fats and Oils Regular salad dressings. Salted butter. Margarine. Ghee. Bacon fat.  Other Potato and tortilla chips. Corn chips and puffs. Salted popcorn and pretzels. Canned or dried soups. Pizza. Frozen entrees and pot pies.  The items listed above may not be a complete list of foods and beverages to avoid.  Contact your dietitian for more information. Document Released: 07/06/2001 Document Revised: 01/19/2013 Document Reviewed: 11/18/2012 [facility] Patient Information 2015 [place], [place]. This information is not intended to replace advice given to you by your health care provider. Make sure you discuss any questions you have with your health care provider. Low-Sodium Eating Plan Sodium raises blood pressure and causes water to be held in the body. Getting less sodium from food will help  lower your blood pressure, reduce any swelling, and protect your heart, liver, and kidneys. We get sodium by adding salt (sodium chloride) to food. Most of our sodium comes from canned, boxed, and frozen foods. Restaurant foods, fast foods, and pizza are also very high in sodium. Even if you take medicine to lower your blood pressure or to reduce fluid in your body, getting less sodium from your food is important. WHAT IS MY PLAN? Most people should limit their sodium intake to 2,300 mg a day. Your health care provider recommends that you limit your sodium intake to __________ a day.  WHAT DO I NEED TO KNOW ABOUT THIS EATING PLAN? For the low-sodium eating plan, you will follow these general guidelines:  Choose foods with a % Daily Value for sodium of less than 5% (as listed on the food label).   Use salt-free seasonings or herbs instead of table salt or sea salt.   Check with your health care provider or pharmacist before using salt substitutes.   Eat fresh foods.  Eat more vegetables and fruits.  Limit canned vegetables. If you do use them, rinse them well to decrease the sodium.   Limit cheese to 1 oz (28 g) per day.   Eat lower-sodium products, often labeled as "lower sodium" or "no salt added."  Avoid foods that contain monosodium glutamate (MSG). MSG is sometimes added to [place] food and some canned foods.  Check food labels (Nutrition Facts labels) on foods to learn how much sodium is in one  serving.  Eat more home-cooked food and less restaurant, buffet, and fast food.  When eating at a restaurant, ask that your food be prepared with less salt or none, if possible.  HOW DO I READ FOOD LABELS FOR SODIUM INFORMATION? The Nutrition Facts label lists the amount of sodium in one serving of the food. If you eat more than one serving, you must multiply the listed amount of sodium by the number of servings. Food labels may also identify foods as:  Sodium free--Less than 5 mg in a serving.  Very low sodium--35 mg or less in a serving.  Low sodium--140 mg or less in a serving.  Light in sodium--50% less sodium in a serving. For example, if a food that usually has 300 mg of sodium is changed to become light in sodium, it will have 150 mg of sodium.  Reduced sodium--25% less sodium in a serving. For example, if a food that usually has 400 mg of sodium is changed to reduced sodium, it will have 300 mg of sodium. WHAT FOODS CAN I EAT? Grains Low-sodium cereals, including oats, puffed wheat and rice, and shredded wheat cereals. Low-sodium crackers. Unsalted rice and pasta. Lower-sodium bread.  Vegetables Frozen or fresh vegetables. Low-sodium or reduced-sodium canned vegetables. Low-sodium or reduced-sodium tomato sauce and paste. Low-sodium or reduced-sodium tomato and vegetable juices.  Fruits Fresh, frozen, and canned fruit. Fruit juice.  Meat and Other Protein Products Low-sodium canned tuna and salmon. Fresh or frozen meat, poultry, seafood, and fish. Lamb. Unsalted nuts. Dried beans, peas, and lentils without added salt. Unsalted canned beans. Homemade soups without salt. Eggs.  Dairy Milk. Soy milk. Ricotta cheese. Low-sodium or reduced-sodium cheeses. Yogurt.  Condiments Fresh and dried herbs and spices. Salt-free seasonings. Onion and garlic powders. Low-sodium varieties of mustard and ketchup. Lemon juice.  Fats and Oils Reduced-sodium salad dressings. Unsalted  butter.  Other Unsalted popcorn and pretzels.  The items listed above may not be a complete list  of recommended foods or beverages. Contact your dietitian for more options. WHAT FOODS ARE NOT RECOMMENDED? Grains Instant hot cereals. Bread stuffing, pancake, and biscuit mixes. Croutons. Seasoned rice or pasta mixes. Noodle soup cups. Boxed or frozen macaroni and cheese. Self-rising flour. Regular salted crackers. Vegetables Regular canned vegetables. Regular canned tomato sauce and paste. Regular tomato and vegetable juices. Frozen vegetables in sauces. Salted french fries. Olives. Angie Fava. Relishes. Sauerkraut. Salsa. Meat and Other Protein Products Salted, canned, smoked, spiced, or pickled meats, seafood, or fish. Bacon, ham, sausage, hot dogs, corned beef, chipped beef, and packaged luncheon meats. Salt pork. Jerky. Pickled herring. Anchovies, regular canned tuna, and sardines. Salted nuts. Dairy Processed cheese and cheese spreads. Cheese curds. Blue cheese and cottage cheese. Buttermilk.  Condiments Onion and garlic salt, seasoned salt, table salt, and sea salt. Canned and packaged gravies. Worcestershire sauce. Tartar sauce. Barbecue sauce. Teriyaki sauce. Soy sauce, including reduced sodium. Steak sauce. Fish sauce. Oyster sauce. Cocktail sauce. Horseradish. Regular ketchup and mustard. Meat flavorings and tenderizers. Bouillon cubes. Hot sauce. Tabasco sauce. Marinades. Taco seasonings. Relishes. Fats and Oils Regular salad dressings. Salted butter. Margarine. Ghee. Bacon fat.  Other Potato and tortilla chips. Corn chips and puffs. Salted popcorn and pretzels. Canned or dried soups. Pizza. Frozen entrees and pot pies.  The items listed above may not be a complete list of foods and beverages to avoid. Contact your dietitian for more information. Document Released: 07/06/2001 Document Revised: 01/19/2013 Document Reviewed: 11/18/2012 [facility] Patient Information 2015 [place],  [place]. This information is not intended to replace advice given to you by your health care provider. Make sure you discuss any questions you have with your health care provider. Low-Sodium Eating Plan Sodium raises blood pressure and causes water to be held in the body. Getting less sodium from food will help lower your blood pressure, reduce any swelling, and protect your heart, liver, and kidneys. We get sodium by adding salt (sodium chloride) to food. Most of our sodium comes from canned, boxed, and frozen foods. Restaurant foods, fast foods, and pizza are also very high in sodium. Even if you take medicine to lower your blood pressure or to reduce fluid in your body, getting less sodium from your food is important. WHAT IS MY PLAN? Most people should limit their sodium intake to 2,300 mg a day. Your health care provider recommends that you limit your sodium intake to __________ a day.  WHAT DO I NEED TO KNOW ABOUT THIS EATING PLAN? For the low-sodium eating plan, you will follow these general guidelines:  Choose foods with a % Daily Value for sodium of less than 5% (as listed on the food label).   Use salt-free seasonings or herbs instead of table salt or sea salt.   Check with your health care provider or pharmacist before using salt substitutes.   Eat fresh foods.  Eat more vegetables and fruits.  Limit canned vegetables. If you do use them, rinse them well to decrease the sodium.   Limit cheese to 1 oz (28 g) per day.   Eat lower-sodium products, often labeled as "lower sodium" or "no salt added."  Avoid foods that contain monosodium glutamate (MSG). MSG is sometimes added to [place] food and some canned foods.  Check food labels (Nutrition Facts labels) on foods to learn how much sodium is in one serving.  Eat more home-cooked food and less restaurant, buffet, and fast food.  When eating at a restaurant, ask that your food be prepared with less  salt or none, if possible.   HOW DO I READ FOOD LABELS FOR SODIUM INFORMATION? The Nutrition Facts label lists the amount of sodium in one serving of the food. If you eat more than one serving, you must multiply the listed amount of sodium by the number of servings. Food labels may also identify foods as:  Sodium free--Less than 5 mg in a serving.  Very low sodium--35 mg or less in a serving.  Low sodium--140 mg or less in a serving.  Light in sodium--50% less sodium in a serving. For example, if a food that usually has 300 mg of sodium is changed to become light in sodium, it will have 150 mg of sodium.  Reduced sodium--25% less sodium in a serving. For example, if a food that usually has 400 mg of sodium is changed to reduced sodium, it will have 300 mg of sodium. WHAT FOODS CAN I EAT? Grains Low-sodium cereals, including oats, puffed wheat and rice, and shredded wheat cereals. Low-sodium crackers. Unsalted rice and pasta. Lower-sodium bread.  Vegetables Frozen or fresh vegetables. Low-sodium or reduced-sodium canned vegetables. Low-sodium or reduced-sodium tomato sauce and paste. Low-sodium or reduced-sodium tomato and vegetable juices.  Fruits Fresh, frozen, and canned fruit. Fruit juice.  Meat and Other Protein Products Low-sodium canned tuna and salmon. Fresh or frozen meat, poultry, seafood, and fish. Lamb. Unsalted nuts. Dried beans, peas, and lentils without added salt. Unsalted canned beans. Homemade soups without salt. Eggs.  Dairy Milk. Soy milk. Ricotta cheese. Low-sodium or reduced-sodium cheeses. Yogurt.  Condiments Fresh and dried herbs and spices. Salt-free seasonings. Onion and garlic powders. Low-sodium varieties of mustard and ketchup. Lemon juice.  Fats and Oils Reduced-sodium salad dressings. Unsalted butter.  Other Unsalted popcorn and pretzels.  The items listed above may not be a complete list of recommended foods or beverages. Contact your dietitian for more options. WHAT  FOODS ARE NOT RECOMMENDED? Grains Instant hot cereals. Bread stuffing, pancake, and biscuit mixes. Croutons. Seasoned rice or pasta mixes. Noodle soup cups. Boxed or frozen macaroni and cheese. Self-rising flour. Regular salted crackers. Vegetables Regular canned vegetables. Regular canned tomato sauce and paste. Regular tomato and vegetable juices. Frozen vegetables in sauces. Salted french fries. Olives. Angie Fava. Relishes. Sauerkraut. Salsa. Meat and Other Protein Products Salted, canned, smoked, spiced, or pickled meats, seafood, or fish. Bacon, ham, sausage, hot dogs, corned beef, chipped beef, and packaged luncheon meats. Salt pork. Jerky. Pickled herring. Anchovies, regular canned tuna, and sardines. Salted nuts. Dairy Processed cheese and cheese spreads. Cheese curds. Blue cheese and cottage cheese. Buttermilk.  Condiments Onion and garlic salt, seasoned salt, table salt, and sea salt. Canned and packaged gravies. Worcestershire sauce. Tartar sauce. Barbecue sauce. Teriyaki sauce. Soy sauce, including reduced sodium. Steak sauce. Fish sauce. Oyster sauce. Cocktail sauce. Horseradish. Regular ketchup and mustard. Meat flavorings and tenderizers. Bouillon cubes. Hot sauce. Tabasco sauce. Marinades. Taco seasonings. Relishes. Fats and Oils Regular salad dressings. Salted butter. Margarine. Ghee. Bacon fat.  Other Potato and tortilla chips. Corn chips and puffs. Salted popcorn and pretzels. Canned or dried soups. Pizza. Frozen entrees and pot pies.  The items listed above may not be a complete list of foods and beverages to avoid. Contact your dietitian for more information. Document Released: 07/06/2001 Document Revised: 01/19/2013 Document Reviewed: 11/18/2012 [facility] Patient Information 2015 [employer], [place]. This information is not intended to replace advice given to you by your health care provider. Make sure you discuss any questions you have with your health care  provider. Low-Sodium Eating Plan Sodium raises blood pressure and causes water to be held in the body. Getting less sodium from food will help lower your blood pressure, reduce any swelling, and protect your heart, liver, and kidneys. We get sodium by adding salt (sodium chloride) to food. Most of our sodium comes from canned, boxed, and frozen foods. Restaurant foods, fast foods, and pizza are also very high in sodium. Even if you take medicine to lower your blood pressure or to reduce fluid in your body, getting less sodium from your food is important. WHAT IS MY PLAN? Most people should limit their sodium intake to 2,300 mg a day. Your health care provider recommends that you limit your sodium intake to __________ a day.  WHAT DO I NEED TO KNOW ABOUT THIS EATING PLAN? For the low-sodium eating plan, you will follow these general guidelines:  Choose foods with a % Daily Value for sodium of less than 5% (as listed on the food label).   Use salt-free seasonings or herbs instead of table salt or sea salt.   Check with your health care provider or pharmacist before using salt substitutes.   Eat fresh foods.  Eat more vegetables and fruits.  Limit canned vegetables. If you do use them, rinse them well to decrease the sodium.   Limit cheese to 1 oz (28 g) per day.   Eat lower-sodium products, often labeled as "lower sodium" or "no salt added."  Avoid foods that contain monosodium glutamate (MSG). MSG is sometimes added to [place] food and some canned foods.  Check food labels (Nutrition Facts labels) on foods to learn how much sodium is in one serving.  Eat more home-cooked food and less restaurant, buffet, and fast food.  When eating at a restaurant, ask that your food be prepared with less salt or none, if possible.  HOW DO I READ FOOD LABELS FOR SODIUM INFORMATION? The Nutrition Facts label lists the amount of sodium in one serving of the food. If you eat more than one  serving, you must multiply the listed amount of sodium by the number of servings. Food labels may also identify foods as:  Sodium free--Less than 5 mg in a serving.  Very low sodium--35 mg or less in a serving.  Low sodium--140 mg or less in a serving.  Light in sodium--50% less sodium in a serving. For example, if a food that usually has 300 mg of sodium is changed to become light in sodium, it will have 150 mg of sodium.  Reduced sodium--25% less sodium in a serving. For example, if a food that usually has 400 mg of sodium is changed to reduced sodium, it will have 300 mg of sodium. WHAT FOODS CAN I EAT? Grains Low-sodium cereals, including oats, puffed wheat and rice, and shredded wheat cereals. Low-sodium crackers. Unsalted rice and pasta. Lower-sodium bread.  Vegetables Frozen or fresh vegetables. Low-sodium or reduced-sodium canned vegetables. Low-sodium or reduced-sodium tomato sauce and paste. Low-sodium or reduced-sodium tomato and vegetable juices.  Fruits Fresh, frozen, and canned fruit. Fruit juice.  Meat and Other Protein Products Low-sodium canned tuna and salmon. Fresh or frozen meat, poultry, seafood, and fish. Lamb. Unsalted nuts. Dried beans, peas, and lentils without added salt. Unsalted canned beans. Homemade soups without salt. Eggs.  Dairy Milk. Soy milk. Ricotta cheese. Low-sodium or reduced-sodium cheeses. Yogurt.  Condiments Fresh and dried herbs and spices. Salt-free seasonings. Onion and garlic powders. Low-sodium varieties of mustard and ketchup. Lemon juice.  Fats and  Oils Reduced-sodium salad dressings. Unsalted butter.  Other Unsalted popcorn and pretzels.  The items listed above may not be a complete list of recommended foods or beverages. Contact your dietitian for more options. WHAT FOODS ARE NOT RECOMMENDED? Grains Instant hot cereals. Bread stuffing, pancake, and biscuit mixes. Croutons. Seasoned rice or pasta mixes. Noodle soup cups.  Boxed or frozen macaroni and cheese. Self-rising flour. Regular salted crackers. Vegetables Regular canned vegetables. Regular canned tomato sauce and paste. Regular tomato and vegetable juices. Frozen vegetables in sauces. Salted french fries. Olives. Angie Fava. Relishes. Sauerkraut. Salsa. Meat and Other Protein Products Salted, canned, smoked, spiced, or pickled meats, seafood, or fish. Bacon, ham, sausage, hot dogs, corned beef, chipped beef, and packaged luncheon meats. Salt pork. Jerky. Pickled herring. Anchovies, regular canned tuna, and sardines. Salted nuts. Dairy Processed cheese and cheese spreads. Cheese curds. Blue cheese and cottage cheese. Buttermilk.  Condiments Onion and garlic salt, seasoned salt, table salt, and sea salt. Canned and packaged gravies. Worcestershire sauce. Tartar sauce. Barbecue sauce. Teriyaki sauce. Soy sauce, including reduced sodium. Steak sauce. Fish sauce. Oyster sauce. Cocktail sauce. Horseradish. Regular ketchup and mustard. Meat flavorings and tenderizers. Bouillon cubes. Hot sauce. Tabasco sauce. Marinades. Taco seasonings. Relishes. Fats and Oils Regular salad dressings. Salted butter. Margarine. Ghee. Bacon fat.  Other Potato and tortilla chips. Corn chips and puffs. Salted popcorn and pretzels. Canned or dried soups. Pizza. Frozen entrees and pot pies.  The items listed above may not be a complete list of foods and beverages to avoid. Contact your dietitian for more information. Document Released: 07/06/2001 Document Revised: 01/19/2013 Document Reviewed: 11/18/2012 [facility] Patient Information 2015 [place], [place]. This information is not intended to replace advice given to you by your health care provider. Make sure you discuss any questions you have with your health care provider. Low-Sodium Eating Plan Sodium raises blood pressure and causes water to be held in the body. Getting less sodium from food will help lower your blood pressure,  reduce any swelling, and protect your heart, liver, and kidneys. We get sodium by adding salt (sodium chloride) to food. Most of our sodium comes from canned, boxed, and frozen foods. Restaurant foods, fast foods, and pizza are also very high in sodium. Even if you take medicine to lower your blood pressure or to reduce fluid in your body, getting less sodium from your food is important. WHAT IS MY PLAN? Most people should limit their sodium intake to 2,300 mg a day. Your health care provider recommends that you limit your sodium intake to __________ a day.  WHAT DO I NEED TO KNOW ABOUT THIS EATING PLAN? For the low-sodium eating plan, you will follow these general guidelines:  Choose foods with a % Daily Value for sodium of less than 5% (as listed on the food label).   Use salt-free seasonings or herbs instead of table salt or sea salt.   Check with your health care provider or pharmacist before using salt substitutes.   Eat fresh foods.  Eat more vegetables and fruits.  Limit canned vegetables. If you do use them, rinse them well to decrease the sodium.   Limit cheese to 1 oz (28 g) per day.   Eat lower-sodium products, often labeled as "lower sodium" or "no salt added."  Avoid foods that contain monosodium glutamate (MSG). MSG is sometimes added to [place] food and some canned foods.  Check food labels (Nutrition Facts labels) on foods to learn how much sodium is in one serving.  Eat  more home-cooked food and less restaurant, buffet, and fast food.  When eating at a restaurant, ask that your food be prepared with less salt or none, if possible.  HOW DO I READ FOOD LABELS FOR SODIUM INFORMATION? The Nutrition Facts label lists the amount of sodium in one serving of the food. If you eat more than one serving, you must multiply the listed amount of sodium by the number of servings. Food labels may also identify foods as:  Sodium free--Less than 5 mg in a serving.  Very  low sodium--35 mg or less in a serving.  Low sodium--140 mg or less in a serving.  Light in sodium--50% less sodium in a serving. For example, if a food that usually has 300 mg of sodium is changed to become light in sodium, it will have 150 mg of sodium.  Reduced sodium--25% less sodium in a serving. For example, if a food that usually has 400 mg of sodium is changed to reduced sodium, it will have 300 mg of sodium. WHAT FOODS CAN I EAT? Grains Low-sodium cereals, including oats, puffed wheat and rice, and shredded wheat cereals. Low-sodium crackers. Unsalted rice and pasta. Lower-sodium bread.  Vegetables Frozen or fresh vegetables. Low-sodium or reduced-sodium canned vegetables. Low-sodium or reduced-sodium tomato sauce and paste. Low-sodium or reduced-sodium tomato and vegetable juices.  Fruits Fresh, frozen, and canned fruit. Fruit juice.  Meat and Other Protein Products Low-sodium canned tuna and salmon. Fresh or frozen meat, poultry, seafood, and fish. Lamb. Unsalted nuts. Dried beans, peas, and lentils without added salt. Unsalted canned beans. Homemade soups without salt. Eggs.  Dairy Milk. Soy milk. Ricotta cheese. Low-sodium or reduced-sodium cheeses. Yogurt.  Condiments Fresh and dried herbs and spices. Salt-free seasonings. Onion and garlic powders. Low-sodium varieties of mustard and ketchup. Lemon juice.  Fats and Oils Reduced-sodium salad dressings. Unsalted butter.  Other Unsalted popcorn and pretzels.  The items listed above may not be a complete list of recommended foods or beverages. Contact your dietitian for more options. WHAT FOODS ARE NOT RECOMMENDED? Grains Instant hot cereals. Bread stuffing, pancake, and biscuit mixes. Croutons. Seasoned rice or pasta mixes. Noodle soup cups. Boxed or frozen macaroni and cheese. Self-rising flour. Regular salted crackers. Vegetables Regular canned vegetables. Regular canned tomato sauce and paste. Regular tomato and  vegetable juices. Frozen vegetables in sauces. Salted french fries. Olives. Angie Fava. Relishes. Sauerkraut. Salsa. Meat and Other Protein Products Salted, canned, smoked, spiced, or pickled meats, seafood, or fish. Bacon, ham, sausage, hot dogs, corned beef, chipped beef, and packaged luncheon meats. Salt pork. Jerky. Pickled herring. Anchovies, regular canned tuna, and sardines. Salted nuts. Dairy Processed cheese and cheese spreads. Cheese curds. Blue cheese and cottage cheese. Buttermilk.  Condiments Onion and garlic salt, seasoned salt, table salt, and sea salt. Canned and packaged gravies. Worcestershire sauce. Tartar sauce. Barbecue sauce. Teriyaki sauce. Soy sauce, including reduced sodium. Steak sauce. Fish sauce. Oyster sauce. Cocktail sauce. Horseradish. Regular ketchup and mustard. Meat flavorings and tenderizers. Bouillon cubes. Hot sauce. Tabasco sauce. Marinades. Taco seasonings. Relishes. Fats and Oils Regular salad dressings. Salted butter. Margarine. Ghee. Bacon fat.  Other Potato and tortilla chips. Corn chips and puffs. Salted popcorn and pretzels. Canned or dried soups. Pizza. Frozen entrees and pot pies.  The items listed above may not be a complete list of foods and beverages to avoid. Contact your dietitian for more information. Document Released: 07/06/2001 Document Revised: 01/19/2013 Document Reviewed: 11/18/2012 [facility] Patient Information 2015 [place], [place]. This information is not intended  to replace advice given to you by your health care provider. Make sure you discuss any questions you have with your health care provider. Low-Sodium Eating Plan Sodium raises blood pressure and causes water to be held in the body. Getting less sodium from food will help lower your blood pressure, reduce any swelling, and protect your heart, liver, and kidneys. We get sodium by adding salt (sodium chloride) to food. Most of our sodium comes from canned, boxed, and frozen foods.  Restaurant foods, fast foods, and pizza are also very high in sodium. Even if you take medicine to lower your blood pressure or to reduce fluid in your body, getting less sodium from your food is important. WHAT IS MY PLAN? Most people should limit their sodium intake to 2,300 mg a day. Your health care provider recommends that you limit your sodium intake to __________ a day.  WHAT DO I NEED TO KNOW ABOUT THIS EATING PLAN? For the low-sodium eating plan, you will follow these general guidelines:  Choose foods with a % Daily Value for sodium of less than 5% (as listed on the food label).   Use salt-free seasonings or herbs instead of table salt or sea salt.   Check with your health care provider or pharmacist before using salt substitutes.   Eat fresh foods.  Eat more vegetables and fruits.  Limit canned vegetables. If you do use them, rinse them well to decrease the sodium.   Limit cheese to 1 oz (28 g) per day.   Eat lower-sodium products, often labeled as "lower sodium" or "no salt added."  Avoid foods that contain monosodium glutamate (MSG). MSG is sometimes added to [place] food and some canned foods.  Check food labels (Nutrition Facts labels) on foods to learn how much sodium is in one serving.  Eat more home-cooked food and less restaurant, buffet, and fast food.  When eating at a restaurant, ask that your food be prepared with less salt or none, if possible.  HOW DO I READ FOOD LABELS FOR SODIUM INFORMATION? The Nutrition Facts label lists the amount of sodium in one serving of the food. If you eat more than one serving, you must multiply the listed amount of sodium by the number of servings. Food labels may also identify foods as:  Sodium free--Less than 5 mg in a serving.  Very low sodium--35 mg or less in a serving.  Low sodium--140 mg or less in a serving.  Light in sodium--50% less sodium in a serving. For example, if a food that usually has 300 mg of  sodium is changed to become light in sodium, it will have 150 mg of sodium.  Reduced sodium--25% less sodium in a serving. For example, if a food that usually has 400 mg of sodium is changed to reduced sodium, it will have 300 mg of sodium. WHAT FOODS CAN I EAT? Grains Low-sodium cereals, including oats, puffed wheat and rice, and shredded wheat cereals. Low-sodium crackers. Unsalted rice and pasta. Lower-sodium bread.  Vegetables Frozen or fresh vegetables. Low-sodium or reduced-sodium canned vegetables. Low-sodium or reduced-sodium tomato sauce and paste. Low-sodium or reduced-sodium tomato and vegetable juices.  Fruits Fresh, frozen, and canned fruit. Fruit juice.  Meat and Other Protein Products Low-sodium canned tuna and salmon. Fresh or frozen meat, poultry, seafood, and fish. Lamb. Unsalted nuts. Dried beans, peas, and lentils without added salt. Unsalted canned beans. Homemade soups without salt. Eggs.  Dairy Milk. Soy milk. Ricotta cheese. Low-sodium or reduced-sodium cheeses. Yogurt.  Condiments Fresh and dried herbs and spices. Salt-free seasonings. Onion and garlic powders. Low-sodium varieties of mustard and ketchup. Lemon juice.  Fats and Oils Reduced-sodium salad dressings. Unsalted butter.  Other Unsalted popcorn and pretzels.  The items listed above may not be a complete list of recommended foods or beverages. Contact your dietitian for more options. WHAT FOODS ARE NOT RECOMMENDED? Grains Instant hot cereals. Bread stuffing, pancake, and biscuit mixes. Croutons. Seasoned rice or pasta mixes. Noodle soup cups. Boxed or frozen macaroni and cheese. Self-rising flour. Regular salted crackers. Vegetables Regular canned vegetables. Regular canned tomato sauce and paste. Regular tomato and vegetable juices. Frozen vegetables in sauces. Salted french fries. Olives. Angie Fava. Relishes. Sauerkraut. Salsa. Meat and Other Protein Products Salted, canned, smoked, spiced, or  pickled meats, seafood, or fish. Bacon, ham, sausage, hot dogs, corned beef, chipped beef, and packaged luncheon meats. Salt pork. Jerky. Pickled herring. Anchovies, regular canned tuna, and sardines. Salted nuts. Dairy Processed cheese and cheese spreads. Cheese curds. Blue cheese and cottage cheese. Buttermilk.  Condiments Onion and garlic salt, seasoned salt, table salt, and sea salt. Canned and packaged gravies. Worcestershire sauce. Tartar sauce. Barbecue sauce. Teriyaki sauce. Soy sauce, including reduced sodium. Steak sauce. Fish sauce. Oyster sauce. Cocktail sauce. Horseradish. Regular ketchup and mustard. Meat flavorings and tenderizers. Bouillon cubes. Hot sauce. Tabasco sauce. Marinades. Taco seasonings. Relishes. Fats and Oils Regular salad dressings. Salted butter. Margarine. Ghee. Bacon fat.  Other Potato and tortilla chips. Corn chips and puffs. Salted popcorn and pretzels. Canned or dried soups. Pizza. Frozen entrees and pot pies.  The items listed above may not be a complete list of foods and beverages to avoid. Contact your dietitian for more information. Document Released: 07/06/2001 Document Revised: 01/19/2013 Document Reviewed: 11/18/2012 [facility] Patient Information 2015 [place], [place]. This information is not intended to replace advice given to you by your health care provider. Make sure you discuss any questions you have with your health care provider. Low-Sodium Eating Plan Sodium raises blood pressure and causes water to be held in the body. Getting less sodium from food will help lower your blood pressure, reduce any swelling, and protect your heart, liver, and kidneys. We get sodium by adding salt (sodium chloride) to food. Most of our sodium comes from canned, boxed, and frozen foods. Restaurant foods, fast foods, and pizza are also very high in sodium. Even if you take medicine to lower your blood pressure or to reduce fluid in your body, getting less sodium from  your food is important. WHAT IS MY PLAN? Most people should limit their sodium intake to 2,300 mg a day. Your health care provider recommends that you limit your sodium intake to __________ a day.  WHAT DO I NEED TO KNOW ABOUT THIS EATING PLAN? For the low-sodium eating plan, you will follow these general guidelines:  Choose foods with a % Daily Value for sodium of less than 5% (as listed on the food label).   Use salt-free seasonings or herbs instead of table salt or sea salt.   Check with your health care provider or pharmacist before using salt substitutes.   Eat fresh foods.  Eat more vegetables and fruits.  Limit canned vegetables. If you do use them, rinse them well to decrease the sodium.   Limit cheese to 1 oz (28 g) per day.   Eat lower-sodium products, often labeled as "lower sodium" or "no salt added."  Avoid foods that contain monosodium glutamate (MSG). MSG is sometimes added to [place]  and some canned foods.  Check food labels (Nutrition Facts labels) on foods to learn how much sodium is in one serving.  Eat more home-cooked food and less restaurant, buffet, and fast food.  When eating at a restaurant, ask that your food be prepared with less salt or none, if possible.  HOW DO I READ FOOD LABELS FOR SODIUM INFORMATION? The Nutrition Facts label lists the amount of sodium in one serving of the food. If you eat more than one serving, you must multiply the listed amount of sodium by the number of servings. Food labels may also identify foods as:  Sodium free--Less than 5 mg in a serving.  Very low sodium--35 mg or less in a serving.  Low sodium--140 mg or less in a serving.  Light in sodium--50% less sodium in a serving. For example, if a food that usually has 300 mg of sodium is changed to become light in sodium, it will have 150 mg of sodium.  Reduced sodium--25% less sodium in a serving. For example, if a food that usually has 400 mg of sodium is  changed to reduced sodium, it will have 300 mg of sodium. WHAT FOODS CAN I EAT? Grains Low-sodium cereals, including oats, puffed wheat and rice, and shredded wheat cereals. Low-sodium crackers. Unsalted rice and pasta. Lower-sodium bread.  Vegetables Frozen or fresh vegetables. Low-sodium or reduced-sodium canned vegetables. Low-sodium or reduced-sodium tomato sauce and paste. Low-sodium or reduced-sodium tomato and vegetable juices.  Fruits Fresh, frozen, and canned fruit. Fruit juice.  Meat and Other Protein Products Low-sodium canned tuna and salmon. Fresh or frozen meat, poultry, seafood, and fish. Lamb. Unsalted nuts. Dried beans, peas, and lentils without added salt. Unsalted canned beans. Homemade soups without salt. Eggs.  Dairy Milk. Soy milk. Ricotta cheese. Low-sodium or reduced-sodium cheeses. Yogurt.  Condiments Fresh and dried herbs and spices. Salt-free seasonings. Onion and garlic powders. Low-sodium varieties of mustard and ketchup. Lemon juice.  Fats and Oils Reduced-sodium salad dressings. Unsalted butter.  Other Unsalted popcorn and pretzels.  The items listed above may not be a complete list of recommended foods or beverages. Contact your dietitian for more options. WHAT FOODS ARE NOT RECOMMENDED? Grains Instant hot cereals. Bread stuffing, pancake, and biscuit mixes. Croutons. Seasoned rice or pasta mixes. Noodle soup cups. Boxed or frozen macaroni and cheese. Self-rising flour. Regular salted crackers. Vegetables Regular canned vegetables. Regular canned tomato sauce and paste. Regular tomato and vegetable juices. Frozen vegetables in sauces. Salted french fries. Olives. Angie Fava. Relishes. Sauerkraut. Salsa. Meat and Other Protein Products Salted, canned, smoked, spiced, or pickled meats, seafood, or fish. Bacon, ham, sausage, hot dogs, corned beef, chipped beef, and packaged luncheon meats. Salt pork. Jerky. Pickled herring. Anchovies, regular canned  tuna, and sardines. Salted nuts. Dairy Processed cheese and cheese spreads. Cheese curds. Blue cheese and cottage cheese. Buttermilk.  Condiments Onion and garlic salt, seasoned salt, table salt, and sea salt. Canned and packaged gravies. Worcestershire sauce. Tartar sauce. Barbecue sauce. Teriyaki sauce. Soy sauce, including reduced sodium. Steak sauce. Fish sauce. Oyster sauce. Cocktail sauce. Horseradish. Regular ketchup and mustard. Meat flavorings and tenderizers. Bouillon cubes. Hot sauce. Tabasco sauce. Marinades. Taco seasonings. Relishes. Fats and Oils Regular salad dressings. Salted butter. Margarine. Ghee. Bacon fat.  Other Potato and tortilla chips. Corn chips and puffs. Salted popcorn and pretzels. Canned or dried soups. Pizza. Frozen entrees and pot pies.  The items listed above may not be a complete list of foods and beverages to avoid. Contact your  dietitian for more information. Document Released: 07/06/2001 Document Revised: 01/19/2013 Document Reviewed: 11/18/2012 [facility] Patient Information 2015 [place], [place]. This information is not intended to replace advice given to you by your health care provider. Make sure you discuss any questions you have with your health care provider. Low-Sodium Eating Plan Sodium raises blood pressure and causes water to be held in the body. Getting less sodium from food will help lower your blood pressure, reduce any swelling, and protect your heart, liver, and kidneys. We get sodium by adding salt (sodium chloride) to food. Most of our sodium comes from canned, boxed, and frozen foods. Restaurant foods, fast foods, and pizza are also very high in sodium. Even if you take medicine to lower your blood pressure or to reduce fluid in your body, getting less sodium from your food is important. WHAT IS MY PLAN? Most people should limit their sodium intake to 2,300 mg a day. Your health care provider recommends that you limit your sodium intake to  __________ a day.  WHAT DO I NEED TO KNOW ABOUT THIS EATING PLAN? For the low-sodium eating plan, you will follow these general guidelines:  Choose foods with a % Daily Value for sodium of less than 5% (as listed on the food label).   Use salt-free seasonings or herbs instead of table salt or sea salt.   Check with your health care provider or pharmacist before using salt substitutes.   Eat fresh foods.  Eat more vegetables and fruits.  Limit canned vegetables. If you do use them, rinse them well to decrease the sodium.   Limit cheese to 1 oz (28 g) per day.   Eat lower-sodium products, often labeled as "lower sodium" or "no salt added."  Avoid foods that contain monosodium glutamate (MSG). MSG is sometimes added to [place] food and some canned foods.  Check food labels (Nutrition Facts labels) on foods to learn how much sodium is in one serving.  Eat more home-cooked food and less restaurant, buffet, and fast food.  When eating at a restaurant, ask that your food be prepared with less salt or none, if possible.  HOW DO I READ FOOD LABELS FOR SODIUM INFORMATION? The Nutrition Facts label lists the amount of sodium in one serving of the food. If you eat more than one serving, you must multiply the listed amount of sodium by the number of servings. Food labels may also identify foods as:  Sodium free--Less than 5 mg in a serving.  Very low sodium--35 mg or less in a serving.  Low sodium--140 mg or less in a serving.  Light in sodium--50% less sodium in a serving. For example, if a food that usually has 300 mg of sodium is changed to become light in sodium, it will have 150 mg of sodium.  Reduced sodium--25% less sodium in a serving. For example, if a food that usually has 400 mg of sodium is changed to reduced sodium, it will have 300 mg of sodium. WHAT FOODS CAN I EAT? Grains Low-sodium cereals, including oats, puffed wheat and rice, and shredded wheat cereals.  Low-sodium crackers. Unsalted rice and pasta. Lower-sodium bread.  Vegetables Frozen or fresh vegetables. Low-sodium or reduced-sodium canned vegetables. Low-sodium or reduced-sodium tomato sauce and paste. Low-sodium or reduced-sodium tomato and vegetable juices.  Fruits Fresh, frozen, and canned fruit. Fruit juice.  Meat and Other Protein Products Low-sodium canned tuna and salmon. Fresh or frozen meat, poultry, seafood, and fish. Lamb. Unsalted nuts. Dried beans, peas, and lentils  without added salt. Unsalted canned beans. Homemade soups without salt. Eggs.  Dairy Milk. Soy milk. Ricotta cheese. Low-sodium or reduced-sodium cheeses. Yogurt.  Condiments Fresh and dried herbs and spices. Salt-free seasonings. Onion and garlic powders. Low-sodium varieties of mustard and ketchup. Lemon juice.  Fats and Oils Reduced-sodium salad dressings. Unsalted butter.  Other Unsalted popcorn and pretzels.  The items listed above may not be a complete list of recommended foods or beverages. Contact your dietitian for more options. WHAT FOODS ARE NOT RECOMMENDED? Grains Instant hot cereals. Bread stuffing, pancake, and biscuit mixes. Croutons. Seasoned rice or pasta mixes. Noodle soup cups. Boxed or frozen macaroni and cheese. Self-rising flour. Regular salted crackers. Vegetables Regular canned vegetables. Regular canned tomato sauce and paste. Regular tomato and vegetable juices. Frozen vegetables in sauces. Salted french fries. Olives. Angie Fava. Relishes. Sauerkraut. Salsa. Meat and Other Protein Products Salted, canned, smoked, spiced, or pickled meats, seafood, or fish. Bacon, ham, sausage, hot dogs, corned beef, chipped beef, and packaged luncheon meats. Salt pork. Jerky. Pickled herring. Anchovies, regular canned tuna, and sardines. Salted nuts. Dairy Processed cheese and cheese spreads. Cheese curds. Blue cheese and cottage cheese. Buttermilk.  Condiments Onion and garlic salt,  seasoned salt, table salt, and sea salt. Canned and packaged gravies. Worcestershire sauce. Tartar sauce. Barbecue sauce. Teriyaki sauce. Soy sauce, including reduced sodium. Steak sauce. Fish sauce. Oyster sauce. Cocktail sauce. Horseradish. Regular ketchup and mustard. Meat flavorings and tenderizers. Bouillon cubes. Hot sauce. Tabasco sauce. Marinades. Taco seasonings. Relishes. Fats and Oils Regular salad dressings. Salted butter. Margarine. Ghee. Bacon fat.  Other Potato and tortilla chips. Corn chips and puffs. Salted popcorn and pretzels. Canned or dried soups. Pizza. Frozen entrees and pot pies.  The items listed above may not be a complete list of foods and beverages to avoid. Contact your dietitian for more information. Document Released: 07/06/2001 Document Revised: 01/19/2013 Document Reviewed: 11/18/2012 [facility] Patient Information 2015 [place], [place]. This information is not intended to replace advice given to you by your health care provider. Make sure you discuss any questions you have with your health care provider. Low-Sodium Eating Plan Sodium raises blood pressure and causes water to be held in the body. Getting less sodium from food will help lower your blood pressure, reduce any swelling, and protect your heart, liver, and kidneys. We get sodium by adding salt (sodium chloride) to food. Most of our sodium comes from canned, boxed, and frozen foods. Restaurant foods, fast foods, and pizza are also very high in sodium. Even if you take medicine to lower your blood pressure or to reduce fluid in your body, getting less sodium from your food is important. WHAT IS MY PLAN? Most people should limit their sodium intake to 2,300 mg a day. Your health care provider recommends that you limit your sodium intake to __________ a day.  WHAT DO I NEED TO KNOW ABOUT THIS EATING PLAN? For the low-sodium eating plan, you will follow these general guidelines:  Choose foods with a % Daily  Value for sodium of less than 5% (as listed on the food label).   Use salt-free seasonings or herbs instead of table salt or sea salt.   Check with your health care provider or pharmacist before using salt substitutes.   Eat fresh foods.  Eat more vegetables and fruits.  Limit canned vegetables. If you do use them, rinse them well to decrease the sodium.   Limit cheese to 1 oz (28 g) per day.   Eat lower-sodium products,  often labeled as "lower sodium" or "no salt added."  Avoid foods that contain monosodium glutamate (MSG). MSG is sometimes added to [place] food and some canned foods.  Check food labels (Nutrition Facts labels) on foods to learn how much sodium is in one serving.  Eat more home-cooked food and less restaurant, buffet, and fast food.  When eating at a restaurant, ask that your food be prepared with less salt or none, if possible.  HOW DO I READ FOOD LABELS FOR SODIUM INFORMATION? The Nutrition Facts label lists the amount of sodium in one serving of the food. If you eat more than one serving, you must multiply the listed amount of sodium by the number of servings. Food labels may also identify foods as:  Sodium free--Less than 5 mg in a serving.  Very low sodium--35 mg or less in a serving.  Low sodium--140 mg or less in a serving.  Light in sodium--50% less sodium in a serving. For example, if a food that usually has 300 mg of sodium is changed to become light in sodium, it will have 150 mg of sodium.  Reduced sodium--25% less sodium in a serving. For example, if a food that usually has 400 mg of sodium is changed to reduced sodium, it will have 300 mg of sodium. WHAT FOODS CAN I EAT? Grains Low-sodium cereals, including oats, puffed wheat and rice, and shredded wheat cereals. Low-sodium crackers. Unsalted rice and pasta. Lower-sodium bread.  Vegetables Frozen or fresh vegetables. Low-sodium or reduced-sodium canned vegetables. Low-sodium or  reduced-sodium tomato sauce and paste. Low-sodium or reduced-sodium tomato and vegetable juices.  Fruits Fresh, frozen, and canned fruit. Fruit juice.  Meat and Other Protein Products Low-sodium canned tuna and salmon. Fresh or frozen meat, poultry, seafood, and fish. Lamb. Unsalted nuts. Dried beans, peas, and lentils without added salt. Unsalted canned beans. Homemade soups without salt. Eggs.  Dairy Milk. Soy milk. Ricotta cheese. Low-sodium or reduced-sodium cheeses. Yogurt.  Condiments Fresh and dried herbs and spices. Salt-free seasonings. Onion and garlic powders. Low-sodium varieties of mustard and ketchup. Lemon juice.  Fats and Oils Reduced-sodium salad dressings. Unsalted butter.  Other Unsalted popcorn and pretzels.  The items listed above may not be a complete list of recommended foods or beverages. Contact your dietitian for more options. WHAT FOODS ARE NOT RECOMMENDED? Grains Instant hot cereals. Bread stuffing, pancake, and biscuit mixes. Croutons. Seasoned rice or pasta mixes. Noodle soup cups. Boxed or frozen macaroni and cheese. Self-rising flour. Regular salted crackers. Vegetables Regular canned vegetables. Regular canned tomato sauce and paste. Regular tomato and vegetable juices. Frozen vegetables in sauces. Salted french fries. Olives. Angie Fava. Relishes. Sauerkraut. Salsa. Meat and Other Protein Products Salted, canned, smoked, spiced, or pickled meats, seafood, or fish. Bacon, ham, sausage, hot dogs, corned beef, chipped beef, and packaged luncheon meats. Salt pork. Jerky. Pickled herring. Anchovies, regular canned tuna, and sardines. Salted nuts. Dairy Processed cheese and cheese spreads. Cheese curds. Blue cheese and cottage cheese. Buttermilk.  Condiments Onion and garlic salt, seasoned salt, table salt, and sea salt. Canned and packaged gravies. Worcestershire sauce. Tartar sauce. Barbecue sauce. Teriyaki sauce. Soy sauce, including reduced sodium.  Steak sauce. Fish sauce. Oyster sauce. Cocktail sauce. Horseradish. Regular ketchup and mustard. Meat flavorings and tenderizers. Bouillon cubes. Hot sauce. Tabasco sauce. Marinades. Taco seasonings. Relishes. Fats and Oils Regular salad dressings. Salted butter. Margarine. Ghee. Bacon fat.  Other Potato and tortilla chips. Corn chips and puffs. Salted popcorn and pretzels. Canned or dried soups. Pizza.  Frozen entrees and pot pies.  The items listed above may not be a complete list of foods and beverages to avoid. Contact your dietitian for more information. Document Released: 07/06/2001 Document Revised: 01/19/2013 Document Reviewed: 11/18/2012 [facility] Patient Information 2015 [place], [place]. This information is not intended to replace advice given to you by your health care provider. Make sure you discuss any questions you have with your health care provider. Low-Sodium Eating Plan Sodium raises blood pressure and causes water to be held in the body. Getting less sodium from food will help lower your blood pressure, reduce any swelling, and protect your heart, liver, and kidneys. We get sodium by adding salt (sodium chloride) to food. Most of our sodium comes from canned, boxed, and frozen foods. Restaurant foods, fast foods, and pizza are also very high in sodium. Even if you take medicine to lower your blood pressure or to reduce fluid in your body, getting less sodium from your food is important. WHAT IS MY PLAN? Most people should limit their sodium intake to 2,300 mg a day. Your health care provider recommends that you limit your sodium intake to __________ a day.  WHAT DO I NEED TO KNOW ABOUT THIS EATING PLAN? For the low-sodium eating plan, you will follow these general guidelines:  Choose foods with a % Daily Value for sodium of less than 5% (as listed on the food label).   Use salt-free seasonings or herbs instead of table salt or sea salt.   Check with your health care provider  or pharmacist before using salt substitutes.   Eat fresh foods.  Eat more vegetables and fruits.  Limit canned vegetables. If you do use them, rinse them well to decrease the sodium.   Limit cheese to 1 oz (28 g) per day.   Eat lower-sodium products, often labeled as "lower sodium" or "no salt added."  Avoid foods that contain monosodium glutamate (MSG). MSG is sometimes added to [place] food and some canned foods.  Check food labels (Nutrition Facts labels) on foods to learn how much sodium is in one serving.  Eat more home-cooked food and less restaurant, buffet, and fast food.  When eating at a restaurant, ask that your food be prepared with less salt or none, if possible.  HOW DO I READ FOOD LABELS FOR SODIUM INFORMATION? The Nutrition Facts label lists the amount of sodium in one serving of the food. If you eat more than one serving, you must multiply the listed amount of sodium by the number of servings. Food labels may also identify foods as:  Sodium free--Less than 5 mg in a serving.  Very low sodium--35 mg or less in a serving.  Low sodium--140 mg or less in a serving.  Light in sodium--50% less sodium in a serving. For example, if a food that usually has 300 mg of sodium is changed to become light in sodium, it will have 150 mg of sodium.  Reduced sodium--25% less sodium in a serving. For example, if a food that usually has 400 mg of sodium is changed to reduced sodium, it will have 300 mg of sodium. WHAT FOODS CAN I EAT? Grains Low-sodium cereals, including oats, puffed wheat and rice, and shredded wheat cereals. Low-sodium crackers. Unsalted rice and pasta. Lower-sodium bread.  Vegetables Frozen or fresh vegetables. Low-sodium or reduced-sodium canned vegetables. Low-sodium or reduced-sodium tomato sauce and paste. Low-sodium or reduced-sodium tomato and vegetable juices.  Fruits Fresh, frozen, and canned fruit. Fruit juice.  Meat and Other  Protein  Products Low-sodium canned tuna and salmon. Fresh or frozen meat, poultry, seafood, and fish. Lamb. Unsalted nuts. Dried beans, peas, and lentils without added salt. Unsalted canned beans. Homemade soups without salt. Eggs.  Dairy Milk. Soy milk. Ricotta cheese. Low-sodium or reduced-sodium cheeses. Yogurt.  Condiments Fresh and dried herbs and spices. Salt-free seasonings. Onion and garlic powders. Low-sodium varieties of mustard and ketchup. Lemon juice.  Fats and Oils Reduced-sodium salad dressings. Unsalted butter.  Other Unsalted popcorn and pretzels.  The items listed above may not be a complete list of recommended foods or beverages. Contact your dietitian for more options. WHAT FOODS ARE NOT RECOMMENDED? Grains Instant hot cereals. Bread stuffing, pancake, and biscuit mixes. Croutons. Seasoned rice or pasta mixes. Noodle soup cups. Boxed or frozen macaroni and cheese. Self-rising flour. Regular salted crackers. Vegetables Regular canned vegetables. Regular canned tomato sauce and paste. Regular tomato and vegetable juices. Frozen vegetables in sauces. Salted french fries. Olives. Angie Fava. Relishes. Sauerkraut. Salsa. Meat and Other Protein Products Salted, canned, smoked, spiced, or pickled meats, seafood, or fish. Bacon, ham, sausage, hot dogs, corned beef, chipped beef, and packaged luncheon meats. Salt pork. Jerky. Pickled herring. Anchovies, regular canned tuna, and sardines. Salted nuts. Dairy Processed cheese and cheese spreads. Cheese curds. Blue cheese and cottage cheese. Buttermilk.  Condiments Onion and garlic salt, seasoned salt, table salt, and sea salt. Canned and packaged gravies. Worcestershire sauce. Tartar sauce. Barbecue sauce. Teriyaki sauce. Soy sauce, including reduced sodium. Steak sauce. Fish sauce. Oyster sauce. Cocktail sauce. Horseradish. Regular ketchup and mustard. Meat flavorings and tenderizers. Bouillon cubes. Hot sauce. Tabasco sauce.  Marinades. Taco seasonings. Relishes. Fats and Oils Regular salad dressings. Salted butter. Margarine. Ghee. Bacon fat.  Other Potato and tortilla chips. Corn chips and puffs. Salted popcorn and pretzels. Canned or dried soups. Pizza. Frozen entrees and pot pies.  The items listed above may not be a complete list of foods and beverages to avoid. Contact your dietitian for more information. Document Released: 07/06/2001 Document Revised: 01/19/2013 Document Reviewed: 11/18/2012 [facility] Patient Information 2015 [place], [place]. This information is not intended to replace advice given to you by your health care provider. Make sure you discuss any questions you have with your health care provider. Low-Sodium Eating Plan Sodium raises blood pressure and causes water to be held in the body. Getting less sodium from food will help lower your blood pressure, reduce any swelling, and protect your heart, liver, and kidneys. We get sodium by adding salt (sodium chloride) to food. Most of our sodium comes from canned, boxed, and frozen foods. Restaurant foods, fast foods, and pizza are also very high in sodium. Even if you take medicine to lower your blood pressure or to reduce fluid in your body, getting less sodium from your food is important. WHAT IS MY PLAN? Most people should limit their sodium intake to 2,300 mg a day. Your health care provider recommends that you limit your sodium intake to __________ a day.  WHAT DO I NEED TO KNOW ABOUT THIS EATING PLAN? For the low-sodium eating plan, you will follow these general guidelines:  Choose foods with a % Daily Value for sodium of less than 5% (as listed on the food label).   Use salt-free seasonings or herbs instead of table salt or sea salt.   Check with your health care provider or pharmacist before using salt substitutes.   Eat fresh foods.  Eat more vegetables and fruits.  Limit canned vegetables. If you do use them,  rinse them well to  decrease the sodium.   Limit cheese to 1 oz (28 g) per day.   Eat lower-sodium products, often labeled as "lower sodium" or "no salt added."  Avoid foods that contain monosodium glutamate (MSG). MSG is sometimes added to [place] food and some canned foods.  Check food labels (Nutrition Facts labels) on foods to learn how much sodium is in one serving.  Eat more home-cooked food and less restaurant, buffet, and fast food.  When eating at a restaurant, ask that your food be prepared with less salt or none, if possible.  HOW DO I READ FOOD LABELS FOR SODIUM INFORMATION? The Nutrition Facts label lists the amount of sodium in one serving of the food. If you eat more than one serving, you must multiply the listed amount of sodium by the number of servings. Food labels may also identify foods as:  Sodium free--Less than 5 mg in a serving.  Very low sodium--35 mg or less in a serving.  Low sodium--140 mg or less in a serving.  Light in sodium--50% less sodium in a serving. For example, if a food that usually has 300 mg of sodium is changed to become light in sodium, it will have 150 mg of sodium.  Reduced sodium--25% less sodium in a serving. For example, if a food that usually has 400 mg of sodium is changed to reduced sodium, it will have 300 mg of sodium. WHAT FOODS CAN I EAT? Grains Low-sodium cereals, including oats, puffed wheat and rice, and shredded wheat cereals. Low-sodium crackers. Unsalted rice and pasta. Lower-sodium bread.  Vegetables Frozen or fresh vegetables. Low-sodium or reduced-sodium canned vegetables. Low-sodium or reduced-sodium tomato sauce and paste. Low-sodium or reduced-sodium tomato and vegetable juices.  Fruits Fresh, frozen, and canned fruit. Fruit juice.  Meat and Other Protein Products Low-sodium canned tuna and salmon. Fresh or frozen meat, poultry, seafood, and fish. Lamb. Unsalted nuts. Dried beans, peas, and lentils without added salt.  Unsalted canned beans. Homemade soups without salt. Eggs.  Dairy Milk. Soy milk. Ricotta cheese. Low-sodium or reduced-sodium cheeses. Yogurt.  Condiments Fresh and dried herbs and spices. Salt-free seasonings. Onion and garlic powders. Low-sodium varieties of mustard and ketchup. Lemon juice.  Fats and Oils Reduced-sodium salad dressings. Unsalted butter.  Other Unsalted popcorn and pretzels.  The items listed above may not be a complete list of recommended foods or beverages. Contact your dietitian for more options. WHAT FOODS ARE NOT RECOMMENDED? Grains Instant hot cereals. Bread stuffing, pancake, and biscuit mixes. Croutons. Seasoned rice or pasta mixes. Noodle soup cups. Boxed or frozen macaroni and cheese. Self-rising flour. Regular salted crackers. Vegetables Regular canned vegetables. Regular canned tomato sauce and paste. Regular tomato and vegetable juices. Frozen vegetables in sauces. Salted french fries. Olives. Angie Fava. Relishes. Sauerkraut. Salsa. Meat and Other Protein Products Salted, canned, smoked, spiced, or pickled meats, seafood, or fish. Bacon, ham, sausage, hot dogs, corned beef, chipped beef, and packaged luncheon meats. Salt pork. Jerky. Pickled herring. Anchovies, regular canned tuna, and sardines. Salted nuts. Dairy Processed cheese and cheese spreads. Cheese curds. Blue cheese and cottage cheese. Buttermilk.  Condiments Onion and garlic salt, seasoned salt, table salt, and sea salt. Canned and packaged gravies. Worcestershire sauce. Tartar sauce. Barbecue sauce. Teriyaki sauce. Soy sauce, including reduced sodium. Steak sauce. Fish sauce. Oyster sauce. Cocktail sauce. Horseradish. Regular ketchup and mustard. Meat flavorings and tenderizers. Bouillon cubes. Hot sauce. Tabasco sauce. Marinades. Taco seasonings. Relishes. Fats and Oils Regular salad dressings. Salted butter.  Margarine. Ghee. Bacon fat.  Other Potato and tortilla chips. Corn chips and  puffs. Salted popcorn and pretzels. Canned or dried soups. Pizza. Frozen entrees and pot pies.  The items listed above may not be a complete list of foods and beverages to avoid. Contact your dietitian for more information. Document Released: 07/06/2001 Document Revised: 01/19/2013 Document Reviewed: 11/18/2012 [facility] Patient Information 2015 [place], [place]. This information is not intended to replace advice given to you by your health care provider. Make sure you discuss any questions you have with your health care provider. Low-Sodium Eating Plan Sodium raises blood pressure and causes water to be held in the body. Getting less sodium from food will help lower your blood pressure, reduce any swelling, and protect your heart, liver, and kidneys. We get sodium by adding salt (sodium chloride) to food. Most of our sodium comes from canned, boxed, and frozen foods. Restaurant foods, fast foods, and pizza are also very high in sodium. Even if you take medicine to lower your blood pressure or to reduce fluid in your body, getting less sodium from your food is important. WHAT IS MY PLAN? Most people should limit their sodium intake to 2,300 mg a day. Your health care provider recommends that you limit your sodium intake to __________ a day.  WHAT DO I NEED TO KNOW ABOUT THIS EATING PLAN? For the low-sodium eating plan, you will follow these general guidelines:  Choose foods with a % Daily Value for sodium of less than 5% (as listed on the food label).   Use salt-free seasonings or herbs instead of table salt or sea salt.   Check with your health care provider or pharmacist before using salt substitutes.   Eat fresh foods.  Eat more vegetables and fruits.  Limit canned vegetables. If you do use them, rinse them well to decrease the sodium.   Limit cheese to 1 oz (28 g) per day.   Eat lower-sodium products, often labeled as "lower sodium" or "no salt added."  Avoid foods that contain  monosodium glutamate (MSG). MSG is sometimes added to [place] food and some canned foods.  Check food labels (Nutrition Facts labels) on foods to learn how much sodium is in one serving.  Eat more home-cooked food and less restaurant, buffet, and fast food.  When eating at a restaurant, ask that your food be prepared with less salt or none, if possible.  HOW DO I READ FOOD LABELS FOR SODIUM INFORMATION? The Nutrition Facts label lists the amount of sodium in one serving of the food. If you eat more than one serving, you must multiply the listed amount of sodium by the number of servings. Food labels may also identify foods as:  Sodium free--Less than 5 mg in a serving.  Very low sodium--35 mg or less in a serving.  Low sodium--140 mg or less in a serving.  Light in sodium--50% less sodium in a serving. For example, if a food that usually has 300 mg of sodium is changed to become light in sodium, it will have 150 mg of sodium.  Reduced sodium--25% less sodium in a serving. For example, if a food that usually has 400 mg of sodium is changed to reduced sodium, it will have 300 mg of sodium. WHAT FOODS CAN I EAT? Grains Low-sodium cereals, including oats, puffed wheat and rice, and shredded wheat cereals. Low-sodium crackers. Unsalted rice and pasta. Lower-sodium bread.  Vegetables Frozen or fresh vegetables. Low-sodium or reduced-sodium canned vegetables. Low-sodium or reduced-sodium  tomato sauce and paste. Low-sodium or reduced-sodium tomato and vegetable juices.  Fruits Fresh, frozen, and canned fruit. Fruit juice.  Meat and Other Protein Products Low-sodium canned tuna and salmon. Fresh or frozen meat, poultry, seafood, and fish. Lamb. Unsalted nuts. Dried beans, peas, and lentils without added salt. Unsalted canned beans. Homemade soups without salt. Eggs.  Dairy Milk. Soy milk. Ricotta cheese. Low-sodium or reduced-sodium cheeses. Yogurt.  Condiments Fresh and dried herbs  and spices. Salt-free seasonings. Onion and garlic powders. Low-sodium varieties of mustard and ketchup. Lemon juice.  Fats and Oils Reduced-sodium salad dressings. Unsalted butter.  Other Unsalted popcorn and pretzels.  The items listed above may not be a complete list of recommended foods or beverages. Contact your dietitian for more options. WHAT FOODS ARE NOT RECOMMENDED? Grains Instant hot cereals. Bread stuffing, pancake, and biscuit mixes. Croutons. Seasoned rice or pasta mixes. Noodle soup cups. Boxed or frozen macaroni and cheese. Self-rising flour. Regular salted crackers. Vegetables Regular canned vegetables. Regular canned tomato sauce and paste. Regular tomato and vegetable juices. Frozen vegetables in sauces. Salted french fries. Olives. Angie Fava. Relishes. Sauerkraut. Salsa. Meat and Other Protein Products Salted, canned, smoked, spiced, or pickled meats, seafood, or fish. Bacon, ham, sausage, hot dogs, corned beef, chipped beef, and packaged luncheon meats. Salt pork. Jerky. Pickled herring. Anchovies, regular canned tuna, and sardines. Salted nuts. Dairy Processed cheese and cheese spreads. Cheese curds. Blue cheese and cottage cheese. Buttermilk.  Condiments Onion and garlic salt, seasoned salt, table salt, and sea salt. Canned and packaged gravies. Worcestershire sauce. Tartar sauce. Barbecue sauce. Teriyaki sauce. Soy sauce, including reduced sodium. Steak sauce. Fish sauce. Oyster sauce. Cocktail sauce. Horseradish. Regular ketchup and mustard. Meat flavorings and tenderizers. Bouillon cubes. Hot sauce. Tabasco sauce. Marinades. Taco seasonings. Relishes. Fats and Oils Regular salad dressings. Salted butter. Margarine. Ghee. Bacon fat.  Other Potato and tortilla chips. Corn chips and puffs. Salted popcorn and pretzels. Canned or dried soups. Pizza. Frozen entrees and pot pies.  The items listed above may not be a complete list of foods and beverages to avoid.  Contact your dietitian for more information. Document Released: 07/06/2001 Document Revised: 01/19/2013 Document Reviewed: 11/18/2012 [facility] Patient Information 2015 [place], [place]. This information is not intended to replace advice given to you by your health care provider. Make sure you discuss any questions you have with your health care provider. Low-Sodium Eating Plan Sodium raises blood pressure and causes water to be held in the body. Getting less sodium from food will help lower your blood pressure, reduce any swelling, and protect your heart, liver, and kidneys. We get sodium by adding salt (sodium chloride) to food. Most of our sodium comes from canned, boxed, and frozen foods. Restaurant foods, fast foods, and pizza are also very high in sodium. Even if you take medicine to lower your blood pressure or to reduce fluid in your body, getting less sodium from your food is important. WHAT IS MY PLAN? Most people should limit their sodium intake to 2,300 mg a day. Your health care provider recommends that you limit your sodium intake to __________ a day.  WHAT DO I NEED TO KNOW ABOUT THIS EATING PLAN? For the low-sodium eating plan, you will follow these general guidelines:  Choose foods with a % Daily Value for sodium of less than 5% (as listed on the food label).   Use salt-free seasonings or herbs instead of table salt or sea salt.   Check with your health care provider or pharmacist  before using salt substitutes.   Eat fresh foods.  Eat more vegetables and fruits.  Limit canned vegetables. If you do use them, rinse them well to decrease the sodium.   Limit cheese to 1 oz (28 g) per day.   Eat lower-sodium products, often labeled as "lower sodium" or "no salt added."  Avoid foods that contain monosodium glutamate (MSG). MSG is sometimes added to [place] food and some canned foods.  Check food labels (Nutrition Facts labels) on foods to learn how much sodium is in one  serving.  Eat more home-cooked food and less restaurant, buffet, and fast food.  When eating at a restaurant, ask that your food be prepared with less salt or none, if possible.  HOW DO I READ FOOD LABELS FOR SODIUM INFORMATION? The Nutrition Facts label lists the amount of sodium in one serving of the food. If you eat more than one serving, you must multiply the listed amount of sodium by the number of servings. Food labels may also identify foods as:  Sodium free--Less than 5 mg in a serving.  Very low sodium--35 mg or less in a serving.  Low sodium--140 mg or less in a serving.  Light in sodium--50% less sodium in a serving. For example, if a food that usually has 300 mg of sodium is changed to become light in sodium, it will have 150 mg of sodium.  Reduced sodium--25% less sodium in a serving. For example, if a food that usually has 400 mg of sodium is changed to reduced sodium, it will have 300 mg of sodium. WHAT FOODS CAN I EAT? Grains Low-sodium cereals, including oats, puffed wheat and rice, and shredded wheat cereals. Low-sodium crackers. Unsalted rice and pasta. Lower-sodium bread.  Vegetables Frozen or fresh vegetables. Low-sodium or reduced-sodium canned vegetables. Low-sodium or reduced-sodium tomato sauce and paste. Low-sodium or reduced-sodium tomato and vegetable juices.  Fruits Fresh, frozen, and canned fruit. Fruit juice.  Meat and Other Protein Products Low-sodium canned tuna and salmon. Fresh or frozen meat, poultry, seafood, and fish. Lamb. Unsalted nuts. Dried beans, peas, and lentils without added salt. Unsalted canned beans. Homemade soups without salt. Eggs.  Dairy Milk. Soy milk. Ricotta cheese. Low-sodium or reduced-sodium cheeses. Yogurt.  Condiments Fresh and dried herbs and spices. Salt-free seasonings. Onion and garlic powders. Low-sodium varieties of mustard and ketchup. Lemon juice.  Fats and Oils Reduced-sodium salad dressings. Unsalted  butter.  Other Unsalted popcorn and pretzels.  The items listed above may not be a complete list of recommended foods or beverages. Contact your dietitian for more options. WHAT FOODS ARE NOT RECOMMENDED? Grains Instant hot cereals. Bread stuffing, pancake, and biscuit mixes. Croutons. Seasoned rice or pasta mixes. Noodle soup cups. Boxed or frozen macaroni and cheese. Self-rising flour. Regular salted crackers. Vegetables Regular canned vegetables. Regular canned tomato sauce and paste. Regular tomato and vegetable juices. Frozen vegetables in sauces. Salted french fries. Olives. Angie Fava. Relishes. Sauerkraut. Salsa. Meat and Other Protein Products Salted, canned, smoked, spiced, or pickled meats, seafood, or fish. Bacon, ham, sausage, hot dogs, corned beef, chipped beef, and packaged luncheon meats. Salt pork. Jerky. Pickled herring. Anchovies, regular canned tuna, and sardines. Salted nuts. Dairy Processed cheese and cheese spreads. Cheese curds. Blue cheese and cottage cheese. Buttermilk.  Condiments Onion and garlic salt, seasoned salt, table salt, and sea salt. Canned and packaged gravies. Worcestershire sauce. Tartar sauce. Barbecue sauce. Teriyaki sauce. Soy sauce, including reduced sodium. Steak sauce. Fish sauce. Oyster sauce. Cocktail sauce. Horseradish. Regular ketchup  and mustard. Meat flavorings and tenderizers. Bouillon cubes. Hot sauce. Tabasco sauce. Marinades. Taco seasonings. Relishes. Fats and Oils Regular salad dressings. Salted butter. Margarine. Ghee. Bacon fat.  Other Potato and tortilla chips. Corn chips and puffs. Salted popcorn and pretzels. Canned or dried soups. Pizza. Frozen entrees and pot pies.  The items listed above may not be a complete list of foods and beverages to avoid. Contact your dietitian for more information. Document Released: 07/06/2001 Document Revised: 01/19/2013 Document Reviewed: 11/18/2012 [facility] Patient Information 2015 [place],  [place]. This information is not intended to replace advice given to you by your health care provider. Make sure you discuss any questions you have with your health care provider. Low-Sodium Eating Plan Sodium raises blood pressure and causes water to be held in the body. Getting less sodium from food will help lower your blood pressure, reduce any swelling, and protect your heart, liver, and kidneys. We get sodium by adding salt (sodium chloride) to food. Most of our sodium comes from canned, boxed, and frozen foods. Restaurant foods, fast foods, and pizza are also very high in sodium. Even if you take medicine to lower your blood pressure or to reduce fluid in your body, getting less sodium from your food is important. WHAT IS MY PLAN? Most people should limit their sodium intake to 2,300 mg a day. Your health care provider recommends that you limit your sodium intake to __________ a day.  WHAT DO I NEED TO KNOW ABOUT THIS EATING PLAN? For the low-sodium eating plan, you will follow these general guidelines:  Choose foods with a % Daily Value for sodium of less than 5% (as listed on the food label).   Use salt-free seasonings or herbs instead of table salt or sea salt.   Check with your health care provider or pharmacist before using salt substitutes.   Eat fresh foods.  Eat more vegetables and fruits.  Limit canned vegetables. If you do use them, rinse them well to decrease the sodium.   Limit cheese to 1 oz (28 g) per day.   Eat lower-sodium products, often labeled as "lower sodium" or "no salt added."  Avoid foods that contain monosodium glutamate (MSG). MSG is sometimes added to [place] food and some canned foods.  Check food labels (Nutrition Facts labels) on foods to learn how much sodium is in one serving.  Eat more home-cooked food and less restaurant, buffet, and fast food.  When eating at a restaurant, ask that your food be prepared with less salt or none, if possible.   HOW DO I READ FOOD LABELS FOR SODIUM INFORMATION? The Nutrition Facts label lists the amount of sodium in one serving of the food. If you eat more than one serving, you must multiply the listed amount of sodium by the number of servings. Food labels may also identify foods as:  Sodium free--Less than 5 mg in a serving.  Very low sodium--35 mg or less in a serving.  Low sodium--140 mg or less in a serving.  Light in sodium--50% less sodium in a serving. For example, if a food that usually has 300 mg of sodium is changed to become light in sodium, it will have 150 mg of sodium.  Reduced sodium--25% less sodium in a serving. For example, if a food that usually has 400 mg of sodium is changed to reduced sodium, it will have 300 mg of sodium. WHAT FOODS CAN I EAT? Grains Low-sodium cereals, including oats, puffed wheat and rice, and shredded  wheat cereals. Low-sodium crackers. Unsalted rice and pasta. Lower-sodium bread.  Vegetables Frozen or fresh vegetables. Low-sodium or reduced-sodium canned vegetables. Low-sodium or reduced-sodium tomato sauce and paste. Low-sodium or reduced-sodium tomato and vegetable juices.  Fruits Fresh, frozen, and canned fruit. Fruit juice.  Meat and Other Protein Products Low-sodium canned tuna and salmon. Fresh or frozen meat, poultry, seafood, and fish. Lamb. Unsalted nuts. Dried beans, peas, and lentils without added salt. Unsalted canned beans. Homemade soups without salt. Eggs.  Dairy Milk. Soy milk. Ricotta cheese. Low-sodium or reduced-sodium cheeses. Yogurt.  Condiments Fresh and dried herbs and spices. Salt-free seasonings. Onion and garlic powders. Low-sodium varieties of mustard and ketchup. Lemon juice.  Fats and Oils Reduced-sodium salad dressings. Unsalted butter.  Other Unsalted popcorn and pretzels.  The items listed above may not be a complete list of recommended foods or beverages. Contact your dietitian for more options. WHAT  FOODS ARE NOT RECOMMENDED? Grains Instant hot cereals. Bread stuffing, pancake, and biscuit mixes. Croutons. Seasoned rice or pasta mixes. Noodle soup cups. Boxed or frozen macaroni and cheese. Self-rising flour. Regular salted crackers. Vegetables Regular canned vegetables. Regular canned tomato sauce and paste. Regular tomato and vegetable juices. Frozen vegetables in sauces. Salted french fries. Olives. Angie Fava. Relishes. Sauerkraut. Salsa. Meat and Other Protein Products Salted, canned, smoked, spiced, or pickled meats, seafood, or fish. Bacon, ham, sausage, hot dogs, corned beef, chipped beef, and packaged luncheon meats. Salt pork. Jerky. Pickled herring. Anchovies, regular canned tuna, and sardines. Salted nuts. Dairy Processed cheese and cheese spreads. Cheese curds. Blue cheese and cottage cheese. Buttermilk.  Condiments Onion and garlic salt, seasoned salt, table salt, and sea salt. Canned and packaged gravies. Worcestershire sauce. Tartar sauce. Barbecue sauce. Teriyaki sauce. Soy sauce, including reduced sodium. Steak sauce. Fish sauce. Oyster sauce. Cocktail sauce. Horseradish. Regular ketchup and mustard. Meat flavorings and tenderizers. Bouillon cubes. Hot sauce. Tabasco sauce. Marinades. Taco seasonings. Relishes. Fats and Oils Regular salad dressings. Salted butter. Margarine. Ghee. Bacon fat.  Other Potato and tortilla chips. Corn chips and puffs. Salted popcorn and pretzels. Canned or dried soups. Pizza. Frozen entrees and pot pies.  The items listed above may not be a complete list of foods and beverages to avoid. Contact your dietitian for more information. Document Released: 07/06/2001 Document Revised: 01/19/2013 Document Reviewed: 11/18/2012 [facility] Patient Information 2015 [place], [place]. This information is not intended to replace advice given to you by your health care provider. Make sure you discuss any questions you have with your health care  provider. Low-Sodium Eating Plan Sodium raises blood pressure and causes water to be held in the body. Getting less sodium from food will help lower your blood pressure, reduce any swelling, and protect your heart, liver, and kidneys. We get sodium by adding salt (sodium chloride) to food. Most of our sodium comes from canned, boxed, and frozen foods. Restaurant foods, fast foods, and pizza are also very high in sodium. Even if you take medicine to lower your blood pressure or to reduce fluid in your body, getting less sodium from your food is important. WHAT IS MY PLAN? Most people should limit their sodium intake to 2,300 mg a day. Your health care provider recommends that you limit your sodium intake to __________ a day.  WHAT DO I NEED TO KNOW ABOUT THIS EATING PLAN? For the low-sodium eating plan, you will follow these general guidelines:  Choose foods with a % Daily Value for sodium of less than 5% (as listed on the food label).  Use salt-free seasonings or herbs instead of table salt or sea salt.   Check with your health care provider or pharmacist before using salt substitutes.   Eat fresh foods.  Eat more vegetables and fruits.  Limit canned vegetables. If you do use them, rinse them well to decrease the sodium.   Limit cheese to 1 oz (28 g) per day.   Eat lower-sodium products, often labeled as "lower sodium" or "no salt added."  Avoid foods that contain monosodium glutamate (MSG). MSG is sometimes added to [place] food and some canned foods.  Check food labels (Nutrition Facts labels) on foods to learn how much sodium is in one serving.  Eat more home-cooked food and less restaurant, buffet, and fast food.  When eating at a restaurant, ask that your food be prepared with less salt or none, if possible.  HOW DO I READ FOOD LABELS FOR SODIUM INFORMATION? The Nutrition Facts label lists the amount of sodium in one serving of the food. If you eat more than one  serving, you must multiply the listed amount of sodium by the number of servings. Food labels may also identify foods as:  Sodium free--Less than 5 mg in a serving.  Very low sodium--35 mg or less in a serving.  Low sodium--140 mg or less in a serving.  Light in sodium--50% less sodium in a serving. For example, if a food that usually has 300 mg of sodium is changed to become light in sodium, it will have 150 mg of sodium.  Reduced sodium--25% less sodium in a serving. For example, if a food that usually has 400 mg of sodium is changed to reduced sodium, it will have 300 mg of sodium. WHAT FOODS CAN I EAT? Grains Low-sodium cereals, including oats, puffed wheat and rice, and shredded wheat cereals. Low-sodium crackers. Unsalted rice and pasta. Lower-sodium bread.  Vegetables Frozen or fresh vegetables. Low-sodium or reduced-sodium canned vegetables. Low-sodium or reduced-sodium tomato sauce and paste. Low-sodium or reduced-sodium tomato and vegetable juices.  Fruits Fresh, frozen, and canned fruit. Fruit juice.  Meat and Other Protein Products Low-sodium canned tuna and salmon. Fresh or frozen meat, poultry, seafood, and fish. Lamb. Unsalted nuts. Dried beans, peas, and lentils without added salt. Unsalted canned beans. Homemade soups without salt. Eggs.  Dairy Milk. Soy milk. Ricotta cheese. Low-sodium or reduced-sodium cheeses. Yogurt.  Condiments Fresh and dried herbs and spices. Salt-free seasonings. Onion and garlic powders. Low-sodium varieties of mustard and ketchup. Lemon juice.  Fats and Oils Reduced-sodium salad dressings. Unsalted butter.  Other Unsalted popcorn and pretzels.  The items listed above may not be a complete list of recommended foods or beverages. Contact your dietitian for more options. WHAT FOODS ARE NOT RECOMMENDED? Grains Instant hot cereals. Bread stuffing, pancake, and biscuit mixes. Croutons. Seasoned rice or pasta mixes. Noodle soup cups.  Boxed or frozen macaroni and cheese. Self-rising flour. Regular salted crackers. Vegetables Regular canned vegetables. Regular canned tomato sauce and paste. Regular tomato and vegetable juices. Frozen vegetables in sauces. Salted french fries. Olives. Angie Fava. Relishes. Sauerkraut. Salsa. Meat and Other Protein Products Salted, canned, smoked, spiced, or pickled meats, seafood, or fish. Bacon, ham, sausage, hot dogs, corned beef, chipped beef, and packaged luncheon meats. Salt pork. Jerky. Pickled herring. Anchovies, regular canned tuna, and sardines. Salted nuts. Dairy Processed cheese and cheese spreads. Cheese curds. Blue cheese and cottage cheese. Buttermilk.  Condiments Onion and garlic salt, seasoned salt, table salt, and sea salt. Canned and packaged gravies. Worcestershire sauce.  Tartar sauce. Barbecue sauce. Teriyaki sauce. Soy sauce, including reduced sodium. Steak sauce. Fish sauce. Oyster sauce. Cocktail sauce. Horseradish. Regular ketchup and mustard. Meat flavorings and tenderizers. Bouillon cubes. Hot sauce. Tabasco sauce. Marinades. Taco seasonings. Relishes. Fats and Oils Regular salad dressings. Salted butter. Margarine. Ghee. Bacon fat.  Other Potato and tortilla chips. Corn chips and puffs. Salted popcorn and pretzels. Canned or dried soups. Pizza. Frozen entrees and pot pies.  The items listed above may not be a complete list of foods and beverages to avoid. Contact your dietitian for more information. Document Released: 07/06/2001 Document Revised: 01/19/2013 Document Reviewed: 11/18/2012 [facility] Patient Information 2015 [place], [place]. This information is not intended to replace advice given to you by your health care provider. Make sure you discuss any questions you have with your health care provider. Low-Sodium Eating Plan Sodium raises blood pressure and causes water to be held in the body. Getting less sodium from food will help lower your blood pressure,  reduce any swelling, and protect your heart, liver, and kidneys. We get sodium by adding salt (sodium chloride) to food. Most of our sodium comes from canned, boxed, and frozen foods. Restaurant foods, fast foods, and pizza are also very high in sodium. Even if you take medicine to lower your blood pressure or to reduce fluid in your body, getting less sodium from your food is important. WHAT IS MY PLAN? Most people should limit their sodium intake to 2,300 mg a day. Your health care provider recommends that you limit your sodium intake to __________ a day.  WHAT DO I NEED TO KNOW ABOUT THIS EATING PLAN? For the low-sodium eating plan, you will follow these general guidelines:  Choose foods with a % Daily Value for sodium of less than 5% (as listed on the food label).   Use salt-free seasonings or herbs instead of table salt or sea salt.   Check with your health care provider or pharmacist before using salt substitutes.   Eat fresh foods.  Eat more vegetables and fruits.  Limit canned vegetables. If you do use them, rinse them well to decrease the sodium.   Limit cheese to 1 oz (28 g) per day.   Eat lower-sodium products, often labeled as "lower sodium" or "no salt added."  Avoid foods that contain monosodium glutamate (MSG). MSG is sometimes added to [place] food and some canned foods.  Check food labels (Nutrition Facts labels) on foods to learn how much sodium is in one serving.  Eat more home-cooked food and less restaurant, buffet, and fast food.  When eating at a restaurant, ask that your food be prepared with less salt or none, if possible.  HOW DO I READ FOOD LABELS FOR SODIUM INFORMATION? The Nutrition Facts label lists the amount of sodium in one serving of the food. If you eat more than one serving, you must multiply the listed amount of sodium by the number of servings. Food labels may also identify foods as:  Sodium free--Less than 5 mg in a serving.  Very  low sodium--35 mg or less in a serving.  Low sodium--140 mg or less in a serving.  Light in sodium--50% less sodium in a serving. For example, if a food that usually has 300 mg of sodium is changed to become light in sodium, it will have 150 mg of sodium.  Reduced sodium--25% less sodium in a serving. For example, if a food that usually has 400 mg of sodium is changed to reduced sodium, it  will have 300 mg of sodium. WHAT FOODS CAN I EAT? Grains Low-sodium cereals, including oats, puffed wheat and rice, and shredded wheat cereals. Low-sodium crackers. Unsalted rice and pasta. Lower-sodium bread.  Vegetables Frozen or fresh vegetables. Low-sodium or reduced-sodium canned vegetables. Low-sodium or reduced-sodium tomato sauce and paste. Low-sodium or reduced-sodium tomato and vegetable juices.  Fruits Fresh, frozen, and canned fruit. Fruit juice.  Meat and Other Protein Products Low-sodium canned tuna and salmon. Fresh or frozen meat, poultry, seafood, and fish. Lamb. Unsalted nuts. Dried beans, peas, and lentils without added salt. Unsalted canned beans. Homemade soups without salt. Eggs.  Dairy Milk. Soy milk. Ricotta cheese. Low-sodium or reduced-sodium cheeses. Yogurt.  Condiments Fresh and dried herbs and spices. Salt-free seasonings. Onion and garlic powders. Low-sodium varieties of mustard and ketchup. Lemon juice.  Fats and Oils Reduced-sodium salad dressings. Unsalted butter.  Other Unsalted popcorn and pretzels.  The items listed above may not be a complete list of recommended foods or beverages. Contact your dietitian for more options. WHAT FOODS ARE NOT RECOMMENDED? Grains Instant hot cereals. Bread stuffing, pancake, and biscuit mixes. Croutons. Seasoned rice or pasta mixes. Noodle soup cups. Boxed or frozen macaroni and cheese. Self-rising flour. Regular salted crackers. Vegetables Regular canned vegetables. Regular canned tomato sauce and paste. Regular tomato and  vegetable juices. Frozen vegetables in sauces. Salted french fries. Olives. Angie Fava. Relishes. Sauerkraut. Salsa. Meat and Other Protein Products Salted, canned, smoked, spiced, or pickled meats, seafood, or fish. Bacon, ham, sausage, hot dogs, corned beef, chipped beef, and packaged luncheon meats. Salt pork. Jerky. Pickled herring. Anchovies, regular canned tuna, and sardines. Salted nuts. Dairy Processed cheese and cheese spreads. Cheese curds. Blue cheese and cottage cheese. Buttermilk.  Condiments Onion and garlic salt, seasoned salt, table salt, and sea salt. Canned and packaged gravies. Worcestershire sauce. Tartar sauce. Barbecue sauce. Teriyaki sauce. Soy sauce, including reduced sodium. Steak sauce. Fish sauce. Oyster sauce. Cocktail sauce. Horseradish. Regular ketchup and mustard. Meat flavorings and tenderizers. Bouillon cubes. Hot sauce. Tabasco sauce. Marinades. Taco seasonings. Relishes. Fats and Oils Regular salad dressings. Salted butter. Margarine. Ghee. Bacon fat.  Other Potato and tortilla chips. Corn chips and puffs. Salted popcorn and pretzels. Canned or dried soups. Pizza. Frozen entrees and pot pies.  The items listed above may not be a complete list of foods and beverages to avoid. Contact your dietitian for more information. Document Released: 07/06/2001 Document Revised: 01/19/2013 Document Reviewed: 11/18/2012 [facility] Patient Information 2015 [place], [place]. This information is not intended to replace advice given to you by your health care provider. Make sure you discuss any questions you have with your health care provider. Low-Sodium Eating Plan Sodium raises blood pressure and causes water to be held in the body. Getting less sodium from food will help lower your blood pressure, reduce any swelling, and protect your heart, liver, and kidneys. We get sodium by adding salt (sodium chloride) to food. Most of our sodium comes from canned, boxed, and frozen foods.  Restaurant foods, fast foods, and pizza are also very high in sodium. Even if you take medicine to lower your blood pressure or to reduce fluid in your body, getting less sodium from your food is important. WHAT IS MY PLAN? Most people should limit their sodium intake to 2,300 mg a day. Your health care provider recommends that you limit your sodium intake to __________ a day.  WHAT DO I NEED TO KNOW ABOUT THIS EATING PLAN? For the low-sodium eating plan, you will follow these  general guidelines:  Choose foods with a % Daily Value for sodium of less than 5% (as listed on the food label).   Use salt-free seasonings or herbs instead of table salt or sea salt.   Check with your health care provider or pharmacist before using salt substitutes.   Eat fresh foods.  Eat more vegetables and fruits.  Limit canned vegetables. If you do use them, rinse them well to decrease the sodium.   Limit cheese to 1 oz (28 g) per day.   Eat lower-sodium products, often labeled as "lower sodium" or "no salt added."  Avoid foods that contain monosodium glutamate (MSG). MSG is sometimes added to [place] food and some canned foods.  Check food labels (Nutrition Facts labels) on foods to learn how much sodium is in one serving.  Eat more home-cooked food and less restaurant, buffet, and fast food.  When eating at a restaurant, ask that your food be prepared with less salt or none, if possible.  HOW DO I READ FOOD LABELS FOR SODIUM INFORMATION? The Nutrition Facts label lists the amount of sodium in one serving of the food. If you eat more than one serving, you must multiply the listed amount of sodium by the number of servings. Food labels may also identify foods as:  Sodium free--Less than 5 mg in a serving.  Very low sodium--35 mg or less in a serving.  Low sodium--140 mg or less in a serving.  Light in sodium--50% less sodium in a serving. For example, if a food that usually has 300 mg of  sodium is changed to become light in sodium, it will have 150 mg of sodium.  Reduced sodium--25% less sodium in a serving. For example, if a food that usually has 400 mg of sodium is changed to reduced sodium, it will have 300 mg of sodium. WHAT FOODS CAN I EAT? Grains Low-sodium cereals, including oats, puffed wheat and rice, and shredded wheat cereals. Low-sodium crackers. Unsalted rice and pasta. Lower-sodium bread.  Vegetables Frozen or fresh vegetables. Low-sodium or reduced-sodium canned vegetables. Low-sodium or reduced-sodium tomato sauce and paste. Low-sodium or reduced-sodium tomato and vegetable juices.  Fruits Fresh, frozen, and canned fruit. Fruit juice.  Meat and Other Protein Products Low-sodium canned tuna and salmon. Fresh or frozen meat, poultry, seafood, and fish. Lamb. Unsalted nuts. Dried beans, peas, and lentils without added salt. Unsalted canned beans. Homemade soups without salt. Eggs.  Dairy Milk. Soy milk. Ricotta cheese. Low-sodium or reduced-sodium cheeses. Yogurt.  Condiments Fresh and dried herbs and spices. Salt-free seasonings. Onion and garlic powders. Low-sodium varieties of mustard and ketchup. Lemon juice.  Fats and Oils Reduced-sodium salad dressings. Unsalted butter.  Other Unsalted popcorn and pretzels.  The items listed above may not be a complete list of recommended foods or beverages. Contact your dietitian for more options. WHAT FOODS ARE NOT RECOMMENDED? Grains Instant hot cereals. Bread stuffing, pancake, and biscuit mixes. Croutons. Seasoned rice or pasta mixes. Noodle soup cups. Boxed or frozen macaroni and cheese. Self-rising flour. Regular salted crackers. Vegetables Regular canned vegetables. Regular canned tomato sauce and paste. Regular tomato and vegetable juices. Frozen vegetables in sauces. Salted french fries. Olives. Angie Fava. Relishes. Sauerkraut. Salsa. Meat and Other Protein Products Salted, canned, smoked, spiced, or  pickled meats, seafood, or fish. Bacon, ham, sausage, hot dogs, corned beef, chipped beef, and packaged luncheon meats. Salt pork. Jerky. Pickled herring. Anchovies, regular canned tuna, and sardines. Salted nuts. Dairy Processed cheese and cheese spreads. Cheese curds. Blue  cheese and cottage cheese. Buttermilk.  Condiments Onion and garlic salt, seasoned salt, table salt, and sea salt. Canned and packaged gravies. Worcestershire sauce. Tartar sauce. Barbecue sauce. Teriyaki sauce. Soy sauce, including reduced sodium. Steak sauce. Fish sauce. Oyster sauce. Cocktail sauce. Horseradish. Regular ketchup and mustard. Meat flavorings and tenderizers. Bouillon cubes. Hot sauce. Tabasco sauce. Marinades. Taco seasonings. Relishes. Fats and Oils Regular salad dressings. Salted butter. Margarine. Ghee. Bacon fat.  Other Potato and tortilla chips. Corn chips and puffs. Salted popcorn and pretzels. Canned or dried soups. Pizza. Frozen entrees and pot pies.  The items listed above may not be a complete list of foods and beverages to avoid. Contact your dietitian for more information. Document Released: 07/06/2001 Document Revised: 01/19/2013 Document Reviewed: 11/18/2012 [facility] Patient Information 2015 [place], [place]. This information is not intended to replace advice given to you by your health care provider. Make sure you discuss any questions you have with your health care provider. Low-Sodium Eating Plan Sodium raises blood pressure and causes water to be held in the body. Getting less sodium from food will help lower your blood pressure, reduce any swelling, and protect your heart, liver, and kidneys. We get sodium by adding salt (sodium chloride) to food. Most of our sodium comes from canned, boxed, and frozen foods. Restaurant foods, fast foods, and pizza are also very high in sodium. Even if you take medicine to lower your blood pressure or to reduce fluid in your body, getting less sodium from  your food is important. WHAT IS MY PLAN? Most people should limit their sodium intake to 2,300 mg a day. Your health care provider recommends that you limit your sodium intake to __________ a day.  WHAT DO I NEED TO KNOW ABOUT THIS EATING PLAN? For the low-sodium eating plan, you will follow these general guidelines:  Choose foods with a % Daily Value for sodium of less than 5% (as listed on the food label).   Use salt-free seasonings or herbs instead of table salt or sea salt.   Check with your health care provider or pharmacist before using salt substitutes.   Eat fresh foods.  Eat more vegetables and fruits.  Limit canned vegetables. If you do use them, rinse them well to decrease the sodium.   Limit cheese to 1 oz (28 g) per day.   Eat lower-sodium products, often labeled as "lower sodium" or "no salt added."  Avoid foods that contain monosodium glutamate (MSG). MSG is sometimes added to [place] food and some canned foods.  Check food labels (Nutrition Facts labels) on foods to learn how much sodium is in one serving.  Eat more home-cooked food and less restaurant, buffet, and fast food.  When eating at a restaurant, ask that your food be prepared with less salt or none, if possible.  HOW DO I READ FOOD LABELS FOR SODIUM INFORMATION? The Nutrition Facts label lists the amount of sodium in one serving of the food. If you eat more than one serving, you must multiply the listed amount of sodium by the number of servings. Food labels may also identify foods as:  Sodium free--Less than 5 mg in a serving.  Very low sodium--35 mg or less in a serving.  Low sodium--140 mg or less in a serving.  Light in sodium--50% less sodium in a serving. For example, if a food that usually has 300 mg of sodium is changed to become light in sodium, it will have 150 mg of sodium.  Reduced sodium--25%  less sodium in a serving. For example, if a food that usually has 400 mg of sodium is  changed to reduced sodium, it will have 300 mg of sodium. WHAT FOODS CAN I EAT? Grains Low-sodium cereals, including oats, puffed wheat and rice, and shredded wheat cereals. Low-sodium crackers. Unsalted rice and pasta. Lower-sodium bread.  Vegetables Frozen or fresh vegetables. Low-sodium or reduced-sodium canned vegetables. Low-sodium or reduced-sodium tomato sauce and paste. Low-sodium or reduced-sodium tomato and vegetable juices.  Fruits Fresh, frozen, and canned fruit. Fruit juice.  Meat and Other Protein Products Low-sodium canned tuna and salmon. Fresh or frozen meat, poultry, seafood, and fish. Lamb. Unsalted nuts. Dried beans, peas, and lentils without added salt. Unsalted canned beans. Homemade soups without salt. Eggs.  Dairy Milk. Soy milk. Ricotta cheese. Low-sodium or reduced-sodium cheeses. Yogurt.  Condiments Fresh and dried herbs and spices. Salt-free seasonings. Onion and garlic powders. Low-sodium varieties of mustard and ketchup. Lemon juice.  Fats and Oils Reduced-sodium salad dressings. Unsalted butter.  Other Unsalted popcorn and pretzels.  The items listed above may not be a complete list of recommended foods or beverages. Contact your dietitian for more options. WHAT FOODS ARE NOT RECOMMENDED? Grains Instant hot cereals. Bread stuffing, pancake, and biscuit mixes. Croutons. Seasoned rice or pasta mixes. Noodle soup cups. Boxed or frozen macaroni and cheese. Self-rising flour. Regular salted crackers. Vegetables Regular canned vegetables. Regular canned tomato sauce and paste. Regular tomato and vegetable juices. Frozen vegetables in sauces. Salted french fries. Olives. Angie Fava. Relishes. Sauerkraut. Salsa. Meat and Other Protein Products Salted, canned, smoked, spiced, or pickled meats, seafood, or fish. Bacon, ham, sausage, hot dogs, corned beef, chipped beef, and packaged luncheon meats. Salt pork. Jerky. Pickled herring. Anchovies, regular canned  tuna, and sardines. Salted nuts. Dairy Processed cheese and cheese spreads. Cheese curds. Blue cheese and cottage cheese. Buttermilk.  Condiments Onion and garlic salt, seasoned salt, table salt, and sea salt. Canned and packaged gravies. Worcestershire sauce. Tartar sauce. Barbecue sauce. Teriyaki sauce. Soy sauce, including reduced sodium. Steak sauce. Fish sauce. Oyster sauce. Cocktail sauce. Horseradish. Regular ketchup and mustard. Meat flavorings and tenderizers. Bouillon cubes. Hot sauce. Tabasco sauce. Marinades. Taco seasonings. Relishes. Fats and Oils Regular salad dressings. Salted butter. Margarine. Ghee. Bacon fat.  Other Potato and tortilla chips. Corn chips and puffs. Salted popcorn and pretzels. Canned or dried soups. Pizza. Frozen entrees and pot pies.  The items listed above may not be a complete list of foods and beverages to avoid. Contact your dietitian for more information. Document Released: 07/06/2001 Document Revised: 01/19/2013 Document Reviewed: 11/18/2012 [facility] Patient Information 2015 [place], [place]. This information is not intended to replace advice given to you by your health care provider. Make sure you discuss any questions you have with your health care provider. Low-Sodium Eating Plan Sodium raises blood pressure and causes water to be held in the body. Getting less sodium from food will help lower your blood pressure, reduce any swelling, and protect your heart, liver, and kidneys. We get sodium by adding salt (sodium chloride) to food. Most of our sodium comes from canned, boxed, and frozen foods. Restaurant foods, fast foods, and pizza are also very high in sodium. Even if you take medicine to lower your blood pressure or to reduce fluid in your body, getting less sodium from your food is important. WHAT IS MY PLAN? Most people should limit their sodium intake to 2,300 mg a day. Your health care provider recommends that you limit your sodium intake to  __________ a day.  WHAT DO I NEED TO KNOW ABOUT THIS EATING PLAN? For the low-sodium eating plan, you will follow these general guidelines:  Choose foods with a % Daily Value for sodium of less than 5% (as listed on the food label).   Use salt-free seasonings or herbs instead of table salt or sea salt.   Check with your health care provider or pharmacist before using salt substitutes.   Eat fresh foods.  Eat more vegetables and fruits.  Limit canned vegetables. If you do use them, rinse them well to decrease the sodium.   Limit cheese to 1 oz (28 g) per day.   Eat lower-sodium products, often labeled as "lower sodium" or "no salt added."  Avoid foods that contain monosodium glutamate (MSG). MSG is sometimes added to [place] food and some canned foods.  Check food labels (Nutrition Facts labels) on foods to learn how much sodium is in one serving.  Eat more home-cooked food and less restaurant, buffet, and fast food.  When eating at a restaurant, ask that your food be prepared with less salt or none, if possible.  HOW DO I READ FOOD LABELS FOR SODIUM INFORMATION? The Nutrition Facts label lists the amount of sodium in one serving of the food. If you eat more than one serving, you must multiply the listed amount of sodium by the number of servings. Food labels may also identify foods as:  Sodium free--Less than 5 mg in a serving.  Very low sodium--35 mg or less in a serving.  Low sodium--140 mg or less in a serving.  Light in sodium--50% less sodium in a serving. For example, if a food that usually has 300 mg of sodium is changed to become light in sodium, it will have 150 mg of sodium.  Reduced sodium--25% less sodium in a serving. For example, if a food that usually has 400 mg of sodium is changed to reduced sodium, it will have 300 mg of sodium. WHAT FOODS CAN I EAT? Grains Low-sodium cereals, including oats, puffed wheat and rice, and shredded wheat cereals.  Low-sodium crackers. Unsalted rice and pasta. Lower-sodium bread.  Vegetables Frozen or fresh vegetables. Low-sodium or reduced-sodium canned vegetables. Low-sodium or reduced-sodium tomato sauce and paste. Low-sodium or reduced-sodium tomato and vegetable juices.  Fruits Fresh, frozen, and canned fruit. Fruit juice.  Meat and Other Protein Products Low-sodium canned tuna and salmon. Fresh or frozen meat, poultry, seafood, and fish. Lamb. Unsalted nuts. Dried beans, peas, and lentils without added salt. Unsalted canned beans. Homemade soups without salt. Eggs.  Dairy Milk. Soy milk. Ricotta cheese. Low-sodium or reduced-sodium cheeses. Yogurt.  Condiments Fresh and dried herbs and spices. Salt-free seasonings. Onion and garlic powders. Low-sodium varieties of mustard and ketchup. Lemon juice.  Fats and Oils Reduced-sodium salad dressings. Unsalted butter.  Other Unsalted popcorn and pretzels.  The items listed above may not be a complete list of recommended foods or beverages. Contact your dietitian for more options. WHAT FOODS ARE NOT RECOMMENDED? Grains Instant hot cereals. Bread stuffing, pancake, and biscuit mixes. Croutons. Seasoned rice or pasta mixes. Noodle soup cups. Boxed or frozen macaroni and cheese. Self-rising flour. Regular salted crackers. Vegetables Regular canned vegetables. Regular canned tomato sauce and paste. Regular tomato and vegetable juices. Frozen vegetables in sauces. Salted french fries. Olives. Angie Fava. Relishes. Sauerkraut. Salsa. Meat and Other Protein Products Salted, canned, smoked, spiced, or pickled meats, seafood, or fish. Bacon, ham, sausage, hot dogs, corned beef, chipped beef, and packaged luncheon meats.  Salt pork. Jerky. Pickled herring. Anchovies, regular canned tuna, and sardines. Salted nuts. Dairy Processed cheese and cheese spreads. Cheese curds. Blue cheese and cottage cheese. Buttermilk.  Condiments Onion and garlic salt,  seasoned salt, table salt, and sea salt. Canned and packaged gravies. Worcestershire sauce. Tartar sauce. Barbecue sauce. Teriyaki sauce. Soy sauce, including reduced sodium. Steak sauce. Fish sauce. Oyster sauce. Cocktail sauce. Horseradish. Regular ketchup and mustard. Meat flavorings and tenderizers. Bouillon cubes. Hot sauce. Tabasco sauce. Marinades. Taco seasonings. Relishes. Fats and Oils Regular salad dressings. Salted butter. Margarine. Ghee. Bacon fat.  Other Potato and tortilla chips. Corn chips and puffs. Salted popcorn and pretzels. Canned or dried soups. Pizza. Frozen entrees and pot pies.  The items listed above may not be a complete list of foods and beverages to avoid. Contact your dietitian for more information. Document Released: 07/06/2001 Document Revised: 01/19/2013 Document Reviewed: 11/18/2012 [facility] Patient Information 2015 [place], [place]. This information is not intended to replace advice given to you by your health care provider. Make sure you discuss any questions you have with your health care provider. Low-Sodium Eating Plan Sodium raises blood pressure and causes water to be held in the body. Getting less sodium from food will help lower your blood pressure, reduce any swelling, and protect your heart, liver, and kidneys. We get sodium by adding salt (sodium chloride) to food. Most of our sodium comes from canned, boxed, and frozen foods. Restaurant foods, fast foods, and pizza are also very high in sodium. Even if you take medicine to lower your blood pressure or to reduce fluid in your body, getting less sodium from your food is important. WHAT IS MY PLAN? Most people should limit their sodium intake to 2,300 mg a day. Your health care provider recommends that you limit your sodium intake to __________ a day.  WHAT DO I NEED TO KNOW ABOUT THIS EATING PLAN? For the low-sodium eating plan, you will follow these general guidelines:  Choose foods with a % Daily  Value for sodium of less than 5% (as listed on the food label).   Use salt-free seasonings or herbs instead of table salt or sea salt.   Check with your health care provider or pharmacist before using salt substitutes.   Eat fresh foods.  Eat more vegetables and fruits.  Limit canned vegetables. If you do use them, rinse them well to decrease the sodium.   Limit cheese to 1 oz (28 g) per day.   Eat lower-sodium products, often labeled as "lower sodium" or "no salt added."  Avoid foods that contain monosodium glutamate (MSG). MSG is sometimes added to [place] food and some canned foods.  Check food labels (Nutrition Facts labels) on foods to learn how much sodium is in one serving.  Eat more home-cooked food and less restaurant, buffet, and fast food.  When eating at a restaurant, ask that your food be prepared with less salt or none, if possible.  HOW DO I READ FOOD LABELS FOR SODIUM INFORMATION? The Nutrition Facts label lists the amount of sodium in one serving of the food. If you eat more than one serving, you must multiply the listed amount of sodium by the number of servings. Food labels may also identify foods as:  Sodium free--Less than 5 mg in a serving.  Very low sodium--35 mg or less in a serving.  Low sodium--140 mg or less in a serving.  Light in sodium--50% less sodium in a serving. For example, if a food that  usually has 300 mg of sodium is changed to become light in sodium, it will have 150 mg of sodium.  Reduced sodium--25% less sodium in a serving. For example, if a food that usually has 400 mg of sodium is changed to reduced sodium, it will have 300 mg of sodium. WHAT FOODS CAN I EAT? Grains Low-sodium cereals, including oats, puffed wheat and rice, and shredded wheat cereals. Low-sodium crackers. Unsalted rice and pasta. Lower-sodium bread.  Vegetables Frozen or fresh vegetables. Low-sodium or reduced-sodium canned vegetables. Low-sodium or  reduced-sodium tomato sauce and paste. Low-sodium or reduced-sodium tomato and vegetable juices.  Fruits Fresh, frozen, and canned fruit. Fruit juice.  Meat and Other Protein Products Low-sodium canned tuna and salmon. Fresh or frozen meat, poultry, seafood, and fish. Lamb. Unsalted nuts. Dried beans, peas, and lentils without added salt. Unsalted canned beans. Homemade soups without salt. Eggs.  Dairy Milk. Soy milk. Ricotta cheese. Low-sodium or reduced-sodium cheeses. Yogurt.  Condiments Fresh and dried herbs and spices. Salt-free seasonings. Onion and garlic powders. Low-sodium varieties of mustard and ketchup. Lemon juice.  Fats and Oils Reduced-sodium salad dressings. Unsalted butter.  Other Unsalted popcorn and pretzels.  The items listed above may not be a complete list of recommended foods or beverages. Contact your dietitian for more options. WHAT FOODS ARE NOT RECOMMENDED? Grains Instant hot cereals. Bread stuffing, pancake, and biscuit mixes. Croutons. Seasoned rice or pasta mixes. Noodle soup cups. Boxed or frozen macaroni and cheese. Self-rising flour. Regular salted crackers. Vegetables Regular canned vegetables. Regular canned tomato sauce and paste. Regular tomato and vegetable juices. Frozen vegetables in sauces. Salted french fries. Olives. Angie Fava. Relishes. Sauerkraut. Salsa. Meat and Other Protein Products Salted, canned, smoked, spiced, or pickled meats, seafood, or fish. Bacon, ham, sausage, hot dogs, corned beef, chipped beef, and packaged luncheon meats. Salt pork. Jerky. Pickled herring. Anchovies, regular canned tuna, and sardines. Salted nuts. Dairy Processed cheese and cheese spreads. Cheese curds. Blue cheese and cottage cheese. Buttermilk.  Condiments Onion and garlic salt, seasoned salt, table salt, and sea salt. Canned and packaged gravies. Worcestershire sauce. Tartar sauce. Barbecue sauce. Teriyaki sauce. Soy sauce, including reduced sodium.  Steak sauce. Fish sauce. Oyster sauce. Cocktail sauce. Horseradish. Regular ketchup and mustard. Meat flavorings and tenderizers. Bouillon cubes. Hot sauce. Tabasco sauce. Marinades. Taco seasonings. Relishes. Fats and Oils Regular salad dressings. Salted butter. Margarine. Ghee. Bacon fat.  Other Potato and tortilla chips. Corn chips and puffs. Salted popcorn and pretzels. Canned or dried soups. Pizza. Frozen entrees and pot pies.  The items listed above may not be a complete list of foods and beverages to avoid. Contact your dietitian for more information. Document Released: 07/06/2001 Document Revised: 01/19/2013 Document Reviewed: 11/18/2012 [facility] Patient Information 2015 [place], [place]. This information is not intended to replace advice given to you by your health care provider. Make sure you discuss any questions you have with your health care provider. Low-Sodium Eating Plan Sodium raises blood pressure and causes water to be held in the body. Getting less sodium from food will help lower your blood pressure, reduce any swelling, and protect your heart, liver, and kidneys. We get sodium by adding salt (sodium chloride) to food. Most of our sodium comes from canned, boxed, and frozen foods. Restaurant foods, fast foods, and pizza are also very high in sodium. Even if you take medicine to lower your blood pressure or to reduce fluid in your body, getting less sodium from your food is important. WHAT IS MY PLAN? Most  people should limit their sodium intake to 2,300 mg a day. Your health care provider recommends that you limit your sodium intake to __________ a day.  WHAT DO I NEED TO KNOW ABOUT THIS EATING PLAN? For the low-sodium eating plan, you will follow these general guidelines:  Choose foods with a % Daily Value for sodium of less than 5% (as listed on the food label).   Use salt-free seasonings or herbs instead of table salt or sea salt.   Check with your health care provider  or pharmacist before using salt substitutes.   Eat fresh foods.  Eat more vegetables and fruits.  Limit canned vegetables. If you do use them, rinse them well to decrease the sodium.   Limit cheese to 1 oz (28 g) per day.   Eat lower-sodium products, often labeled as "lower sodium" or "no salt added."  Avoid foods that contain monosodium glutamate (MSG). MSG is sometimes added to [place] food and some canned foods.  Check food labels (Nutrition Facts labels) on foods to learn how much sodium is in one serving.  Eat more home-cooked food and less restaurant, buffet, and fast food.  When eating at a restaurant, ask that your food be prepared with less salt or none, if possible.  HOW DO I READ FOOD LABELS FOR SODIUM INFORMATION? The Nutrition Facts label lists the amount of sodium in one serving of the food. If you eat more than one serving, you must multiply the listed amount of sodium by the number of servings. Food labels may also identify foods as:  Sodium free--Less than 5 mg in a serving.  Very low sodium--35 mg or less in a serving.  Low sodium--140 mg or less in a serving.  Light in sodium--50% less sodium in a serving. For example, if a food that usually has 300 mg of sodium is changed to become light in sodium, it will have 150 mg of sodium.  Reduced sodium--25% less sodium in a serving. For example, if a food that usually has 400 mg of sodium is changed to reduced sodium, it will have 300 mg of sodium. WHAT FOODS CAN I EAT? Grains Low-sodium cereals, including oats, puffed wheat and rice, and shredded wheat cereals. Low-sodium crackers. Unsalted rice and pasta. Lower-sodium bread.  Vegetables Frozen or fresh vegetables. Low-sodium or reduced-sodium canned vegetables. Low-sodium or reduced-sodium tomato sauce and paste. Low-sodium or reduced-sodium tomato and vegetable juices.  Fruits Fresh, frozen, and canned fruit. Fruit juice.  Meat and Other Protein  Products Low-sodium canned tuna and salmon. Fresh or frozen meat, poultry, seafood, and fish. Lamb. Unsalted nuts. Dried beans, peas, and lentils without added salt. Unsalted canned beans. Homemade soups without salt. Eggs.  Dairy Milk. Soy milk. Ricotta cheese. Low-sodium or reduced-sodium cheeses. Yogurt.  Condiments Fresh and dried herbs and spices. Salt-free seasonings. Onion and garlic powders. Low-sodium varieties of mustard and ketchup. Lemon juice.  Fats and Oils Reduced-sodium salad dressings. Unsalted butter.  Other Unsalted popcorn and pretzels.  The items listed above may not be a complete list of recommended foods or beverages. Contact your dietitian for more options. WHAT FOODS ARE NOT RECOMMENDED? Grains Instant hot cereals. Bread stuffing, pancake, and biscuit mixes. Croutons. Seasoned rice or pasta mixes. Noodle soup cups. Boxed or frozen macaroni and cheese. Self-rising flour. Regular salted crackers. Vegetables Regular canned vegetables. Regular canned tomato sauce and paste. Regular tomato and vegetable juices. Frozen vegetables in sauces. Salted french fries. Olives. Angie Fava. Relishes. Sauerkraut. Salsa. Meat and Other Protein  Products Salted, canned, smoked, spiced, or pickled meats, seafood, or fish. Bacon, ham, sausage, hot dogs, corned beef, chipped beef, and packaged luncheon meats. Salt pork. Jerky. Pickled herring. Anchovies, regular canned tuna, and sardines. Salted nuts. Dairy Processed cheese and cheese spreads. Cheese curds. Blue cheese and cottage cheese. Buttermilk.  Condiments Onion and garlic salt, seasoned salt, table salt, and sea salt. Canned and packaged gravies. Worcestershire sauce. Tartar sauce. Barbecue sauce. Teriyaki sauce. Soy sauce, including reduced sodium. Steak sauce. Fish sauce. Oyster sauce. Cocktail sauce. Horseradish. Regular ketchup and mustard. Meat flavorings and tenderizers. Bouillon cubes. Hot sauce. Tabasco sauce.  Marinades. Taco seasonings. Relishes. Fats and Oils Regular salad dressings. Salted butter. Margarine. Ghee. Bacon fat.  Other Potato and tortilla chips. Corn chips and puffs. Salted popcorn and pretzels. Canned or dried soups. Pizza. Frozen entrees and pot pies.  The items listed above may not be a complete list of foods and beverages to avoid. Contact your dietitian for more information. Document Released: 07/06/2001 Document Revised: 01/19/2013 Document Reviewed: 11/18/2012 [facility] Patient Information 2015 [place], [place]. This information is not intended to replace advice given to you by your health care provider. Make sure you discuss any questions you have with your health care provider. Low-Sodium Eating Plan Sodium raises blood pressure and causes water to be held in the body. Getting less sodium from food will help lower your blood pressure, reduce any swelling, and protect your heart, liver, and kidneys. We get sodium by adding salt (sodium chloride) to food. Most of our sodium comes from canned, boxed, and frozen foods. Restaurant foods, fast foods, and pizza are also very high in sodium. Even if you take medicine to lower your blood pressure or to reduce fluid in your body, getting less sodium from your food is important. WHAT IS MY PLAN? Most people should limit their sodium intake to 2,300 mg a day. Your health care provider recommends that you limit your sodium intake to __________ a day.  WHAT DO I NEED TO KNOW ABOUT THIS EATING PLAN? For the low-sodium eating plan, you will follow these general guidelines:  Choose foods with a % Daily Value for sodium of less than 5% (as listed on the food label).   Use salt-free seasonings or herbs instead of table salt or sea salt.   Check with your health care provider or pharmacist before using salt substitutes.   Eat fresh foods.  Eat more vegetables and fruits.  Limit canned vegetables. If you do use them, rinse them well to  decrease the sodium.   Limit cheese to 1 oz (28 g) per day.   Eat lower-sodium products, often labeled as "lower sodium" or "no salt added."  Avoid foods that contain monosodium glutamate (MSG). MSG is sometimes added to [place] food and some canned foods.  Check food labels (Nutrition Facts labels) on foods to learn how much sodium is in one serving.  Eat more home-cooked food and less restaurant, buffet, and fast food.  When eating at a restaurant, ask that your food be prepared with less salt or none, if possible.  HOW DO I READ FOOD LABELS FOR SODIUM INFORMATION? The Nutrition Facts label lists the amount of sodium in one serving of the food. If you eat more than one serving, you must multiply the listed amount of sodium by the number of servings. Food labels may also identify foods as:  Sodium free--Less than 5 mg in a serving.  Very low sodium--35 mg or less in a serving.  Low sodium--140 mg or less in a serving.  Light in sodium--50% less sodium in a serving. For example, if a food that usually has 300 mg of sodium is changed to become light in sodium, it will have 150 mg of sodium.  Reduced sodium--25% less sodium in a serving. For example, if a food that usually has 400 mg of sodium is changed to reduced sodium, it will have 300 mg of sodium. WHAT FOODS CAN I EAT? Grains Low-sodium cereals, including oats, puffed wheat and rice, and shredded wheat cereals. Low-sodium crackers. Unsalted rice and pasta. Lower-sodium bread.  Vegetables Frozen or fresh vegetables. Low-sodium or reduced-sodium canned vegetables. Low-sodium or reduced-sodium tomato sauce and paste. Low-sodium or reduced-sodium tomato and vegetable juices.  Fruits Fresh, frozen, and canned fruit. Fruit juice.  Meat and Other Protein Products Low-sodium canned tuna and salmon. Fresh or frozen meat, poultry, seafood, and fish. Lamb. Unsalted nuts. Dried beans, peas, and lentils without added salt.  Unsalted canned beans. Homemade soups without salt. Eggs.  Dairy Milk. Soy milk. Ricotta cheese. Low-sodium or reduced-sodium cheeses. Yogurt.  Condiments Fresh and dried herbs and spices. Salt-free seasonings. Onion and garlic powders. Low-sodium varieties of mustard and ketchup. Lemon juice.  Fats and Oils Reduced-sodium salad dressings. Unsalted butter.  Other Unsalted popcorn and pretzels.  The items listed above may not be a complete list of recommended foods or beverages. Contact your dietitian for more options. WHAT FOODS ARE NOT RECOMMENDED? Grains Instant hot cereals. Bread stuffing, pancake, and biscuit mixes. Croutons. Seasoned rice or pasta mixes. Noodle soup cups. Boxed or frozen macaroni and cheese. Self-rising flour. Regular salted crackers. Vegetables Regular canned vegetables. Regular canned tomato sauce and paste. Regular tomato and vegetable juices. Frozen vegetables in sauces. Salted french fries. Olives. Angie Fava. Relishes. Sauerkraut. Salsa. Meat and Other Protein Products Salted, canned, smoked, spiced, or pickled meats, seafood, or fish. Bacon, ham, sausage, hot dogs, corned beef, chipped beef, and packaged luncheon meats. Salt pork. Jerky. Pickled herring. Anchovies, regular canned tuna, and sardines. Salted nuts. Dairy Processed cheese and cheese spreads. Cheese curds. Blue cheese and cottage cheese. Buttermilk.  Condiments Onion and garlic salt, seasoned salt, table salt, and sea salt. Canned and packaged gravies. Worcestershire sauce. Tartar sauce. Barbecue sauce. Teriyaki sauce. Soy sauce, including reduced sodium. Steak sauce. Fish sauce. Oyster sauce. Cocktail sauce. Horseradish. Regular ketchup and mustard. Meat flavorings and tenderizers. Bouillon cubes. Hot sauce. Tabasco sauce. Marinades. Taco seasonings. Relishes. Fats and Oils Regular salad dressings. Salted butter. Margarine. Ghee. Bacon fat.  Other Potato and tortilla chips. Corn chips and  puffs. Salted popcorn and pretzels. Canned or dried soups. Pizza. Frozen entrees and pot pies.  The items listed above may not be a complete list of foods and beverages to avoid. Contact your dietitian for more information. Document Released: 07/06/2001 Document Revised: 01/19/2013 Document Reviewed: 11/18/2012 [facility] Patient Information 2015 [place], [place]. This information is not intended to replace advice given to you by your health care provider. Make sure you discuss any questions you have with your health care provider. Low-Sodium Eating Plan Sodium raises blood pressure and causes water to be held in the body. Getting less sodium from food will help lower your blood pressure, reduce any swelling, and protect your heart, liver, and kidneys. We get sodium by adding salt (sodium chloride) to food. Most of our sodium comes from canned, boxed, and frozen foods. Restaurant foods, fast foods, and pizza are also very high in sodium. Even if you take medicine to lower  your blood pressure or to reduce fluid in your body, getting less sodium from your food is important. WHAT IS MY PLAN? Most people should limit their sodium intake to 2,300 mg a day. Your health care provider recommends that you limit your sodium intake to __________ a day.  WHAT DO I NEED TO KNOW ABOUT THIS EATING PLAN? For the low-sodium eating plan, you will follow these general guidelines:  Choose foods with a % Daily Value for sodium of less than 5% (as listed on the food label).   Use salt-free seasonings or herbs instead of table salt or sea salt.   Check with your health care provider or pharmacist before using salt substitutes.   Eat fresh foods.  Eat more vegetables and fruits.  Limit canned vegetables. If you do use them, rinse them well to decrease the sodium.   Limit cheese to 1 oz (28 g) per day.   Eat lower-sodium products, often labeled as "lower sodium" or "no salt added."  Avoid foods that contain  monosodium glutamate (MSG). MSG is sometimes added to [place] food and some canned foods.  Check food labels (Nutrition Facts labels) on foods to learn how much sodium is in one serving.  Eat more home-cooked food and less restaurant, buffet, and fast food.  When eating at a restaurant, ask that your food be prepared with less salt or none, if possible.  HOW DO I READ FOOD LABELS FOR SODIUM INFORMATION? The Nutrition Facts label lists the amount of sodium in one serving of the food. If you eat more than one serving, you must multiply the listed amount of sodium by the number of servings. Food labels may also identify foods as:  Sodium free--Less than 5 mg in a serving.  Very low sodium--35 mg or less in a serving.  Low sodium--140 mg or less in a serving.  Light in sodium--50% less sodium in a serving. For example, if a food that usually has 300 mg of sodium is changed to become light in sodium, it will have 150 mg of sodium.  Reduced sodium--25% less sodium in a serving. For example, if a food that usually has 400 mg of sodium is changed to reduced sodium, it will have 300 mg of sodium. WHAT FOODS CAN I EAT? Grains Low-sodium cereals, including oats, puffed wheat and rice, and shredded wheat cereals. Low-sodium crackers. Unsalted rice and pasta. Lower-sodium bread.  Vegetables Frozen or fresh vegetables. Low-sodium or reduced-sodium canned vegetables. Low-sodium or reduced-sodium tomato sauce and paste. Low-sodium or reduced-sodium tomato and vegetable juices.  Fruits Fresh, frozen, and canned fruit. Fruit juice.  Meat and Other Protein Products Low-sodium canned tuna and salmon. Fresh or frozen meat, poultry, seafood, and fish. Lamb. Unsalted nuts. Dried beans, peas, and lentils without added salt. Unsalted canned beans. Homemade soups without salt. Eggs.  Dairy Milk. Soy milk. Ricotta cheese. Low-sodium or reduced-sodium cheeses. Yogurt.  Condiments Fresh and dried herbs  and spices. Salt-free seasonings. Onion and garlic powders. Low-sodium varieties of mustard and ketchup. Lemon juice.  Fats and Oils Reduced-sodium salad dressings. Unsalted butter.  Other Unsalted popcorn and pretzels.  The items listed above may not be a complete list of recommended foods or beverages. Contact your dietitian for more options. WHAT FOODS ARE NOT RECOMMENDED? Grains Instant hot cereals. Bread stuffing, pancake, and biscuit mixes. Croutons. Seasoned rice or pasta mixes. Noodle soup cups. Boxed or frozen macaroni and cheese. Self-rising flour. Regular salted crackers. Vegetables Regular canned vegetables. Regular canned tomato sauce  and paste. Regular tomato and vegetable juices. Frozen vegetables in sauces. Salted french fries. Olives. Angie Fava. Relishes. Sauerkraut. Salsa. Meat and Other Protein Products Salted, canned, smoked, spiced, or pickled meats, seafood, or fish. Bacon, ham, sausage, hot dogs, corned beef, chipped beef, and packaged luncheon meats. Salt pork. Jerky. Pickled herring. Anchovies, regular canned tuna, and sardines. Salted nuts. Dairy Processed cheese and cheese spreads. Cheese curds. Blue cheese and cottage cheese. Buttermilk.  Condiments Onion and garlic salt, seasoned salt, table salt, and sea salt. Canned and packaged gravies. Worcestershire sauce. Tartar sauce. Barbecue sauce. Teriyaki sauce. Soy sauce, including reduced sodium. Steak sauce. Fish sauce. Oyster sauce. Cocktail sauce. Horseradish. Regular ketchup and mustard. Meat flavorings and tenderizers. Bouillon cubes. Hot sauce. Tabasco sauce. Marinades. Taco seasonings. Relishes. Fats and Oils Regular salad dressings. Salted butter. Margarine. Ghee. Bacon fat.  Other Potato and tortilla chips. Corn chips and puffs. Salted popcorn and pretzels. Canned or dried soups. Pizza. Frozen entrees and pot pies.  The items listed above may not be a complete list of foods and beverages to avoid.  Contact your dietitian for more information. Document Released: 07/06/2001 Document Revised: 01/19/2013 Document Reviewed: 11/18/2012 [facility] Patient Information 2015 [place], [place]. This information is not intended to replace advice given to you by your health care provider. Make sure you discuss any questions you have with your health care provider. Low-Sodium Eating Plan Sodium raises blood pressure and causes water to be held in the body. Getting less sodium from food will help lower your blood pressure, reduce any swelling, and protect your heart, liver, and kidneys. We get sodium by adding salt (sodium chloride) to food. Most of our sodium comes from canned, boxed, and frozen foods. Restaurant foods, fast foods, and pizza are also very high in sodium. Even if you take medicine to lower your blood pressure or to reduce fluid in your body, getting less sodium from your food is important. WHAT IS MY PLAN? Most people should limit their sodium intake to 2,300 mg a day. Your health care provider recommends that you limit your sodium intake to __________ a day.  WHAT DO I NEED TO KNOW ABOUT THIS EATING PLAN? For the low-sodium eating plan, you will follow these general guidelines:  Choose foods with a % Daily Value for sodium of less than 5% (as listed on the food label).   Use salt-free seasonings or herbs instead of table salt or sea salt.   Check with your health care provider or pharmacist before using salt substitutes.   Eat fresh foods.  Eat more vegetables and fruits.  Limit canned vegetables. If you do use them, rinse them well to decrease the sodium.   Limit cheese to 1 oz (28 g) per day.   Eat lower-sodium products, often labeled as "lower sodium" or "no salt added."  Avoid foods that contain monosodium glutamate (MSG). MSG is sometimes added to [place] food and some canned foods.  Check food labels (Nutrition Facts labels) on foods to learn how much sodium is in one  serving.  Eat more home-cooked food and less restaurant, buffet, and fast food.  When eating at a restaurant, ask that your food be prepared with less salt or none, if possible.  HOW DO I READ FOOD LABELS FOR SODIUM INFORMATION? The Nutrition Facts label lists the amount of sodium in one serving of the food. If you eat more than one serving, you must multiply the listed amount of sodium by the number of servings. Food labels may  also identify foods as:  Sodium free--Less than 5 mg in a serving.  Very low sodium--35 mg or less in a serving.  Low sodium--140 mg or less in a serving.  Light in sodium--50% less sodium in a serving. For example, if a food that usually has 300 mg of sodium is changed to become light in sodium, it will have 150 mg of sodium.  Reduced sodium--25% less sodium in a serving. For example, if a food that usually has 400 mg of sodium is changed to reduced sodium, it will have 300 mg of sodium. WHAT FOODS CAN I EAT? Grains Low-sodium cereals, including oats, puffed wheat and rice, and shredded wheat cereals. Low-sodium crackers. Unsalted rice and pasta. Lower-sodium bread.  Vegetables Frozen or fresh vegetables. Low-sodium or reduced-sodium canned vegetables. Low-sodium or reduced-sodium tomato sauce and paste. Low-sodium or reduced-sodium tomato and vegetable juices.  Fruits Fresh, frozen, and canned fruit. Fruit juice.  Meat and Other Protein Products Low-sodium canned tuna and salmon. Fresh or frozen meat, poultry, seafood, and fish. Lamb. Unsalted nuts. Dried beans, peas, and lentils without added salt. Unsalted canned beans. Homemade soups without salt. Eggs.  Dairy Milk. Soy milk. Ricotta cheese. Low-sodium or reduced-sodium cheeses. Yogurt.  Condiments Fresh and dried herbs and spices. Salt-free seasonings. Onion and garlic powders. Low-sodium varieties of mustard and ketchup. Lemon juice.  Fats and Oils Reduced-sodium salad dressings. Unsalted  butter.  Other Unsalted popcorn and pretzels.  The items listed above may not be a complete list of recommended foods or beverages. Contact your dietitian for more options. WHAT FOODS ARE NOT RECOMMENDED? Grains Instant hot cereals. Bread stuffing, pancake, and biscuit mixes. Croutons. Seasoned rice or pasta mixes. Noodle soup cups. Boxed or frozen macaroni and cheese. Self-rising flour. Regular salted crackers. Vegetables Regular canned vegetables. Regular canned tomato sauce and paste. Regular tomato and vegetable juices. Frozen vegetables in sauces. Salted french fries. Olives. Angie Fava. Relishes. Sauerkraut. Salsa. Meat and Other Protein Products Salted, canned, smoked, spiced, or pickled meats, seafood, or fish. Bacon, ham, sausage, hot dogs, corned beef, chipped beef, and packaged luncheon meats. Salt pork. Jerky. Pickled herring. Anchovies, regular canned tuna, and sardines. Salted nuts. Dairy Processed cheese and cheese spreads. Cheese curds. Blue cheese and cottage cheese. Buttermilk.  Condiments Onion and garlic salt, seasoned salt, table salt, and sea salt. Canned and packaged gravies. Worcestershire sauce. Tartar sauce. Barbecue sauce. Teriyaki sauce. Soy sauce, including reduced sodium. Steak sauce. Fish sauce. Oyster sauce. Cocktail sauce. Horseradish. Regular ketchup and mustard. Meat flavorings and tenderizers. Bouillon cubes. Hot sauce. Tabasco sauce. Marinades. Taco seasonings. Relishes. Fats and Oils Regular salad dressings. Salted butter. Margarine. Ghee. Bacon fat.  Other Potato and tortilla chips. Corn chips and puffs. Salted popcorn and pretzels. Canned or dried soups. Pizza. Frozen entrees and pot pies.  The items listed above may not be a complete list of foods and beverages to avoid. Contact your dietitian for more information. Document Released: 07/06/2001 Document Revised: 01/19/2013 Document Reviewed: 11/18/2012 [facility] Patient Information 2015 [place],  [place]. This information is not intended to replace advice given to you by your health care provider. Make sure you discuss any questions you have with your health care provider. Low-Sodium Eating Plan Sodium raises blood pressure and causes water to be held in the body. Getting less sodium from food will help lower your blood pressure, reduce any swelling, and protect your heart, liver, and kidneys. We get sodium by adding salt (sodium chloride) to food. Most of our sodium comes from  canned, boxed, and frozen foods. Restaurant foods, fast foods, and pizza are also very high in sodium. Even if you take medicine to lower your blood pressure or to reduce fluid in your body, getting less sodium from your food is important. WHAT IS MY PLAN? Most people should limit their sodium intake to 2,300 mg a day. Your health care provider recommends that you limit your sodium intake to __________ a day.  WHAT DO I NEED TO KNOW ABOUT THIS EATING PLAN? For the low-sodium eating plan, you will follow these general guidelines:  Choose foods with a % Daily Value for sodium of less than 5% (as listed on the food label).   Use salt-free seasonings or herbs instead of table salt or sea salt.   Check with your health care provider or pharmacist before using salt substitutes.   Eat fresh foods.  Eat more vegetables and fruits.  Limit canned vegetables. If you do use them, rinse them well to decrease the sodium.   Limit cheese to 1 oz (28 g) per day.   Eat lower-sodium products, often labeled as "lower sodium" or "no salt added."  Avoid foods that contain monosodium glutamate (MSG). MSG is sometimes added to [place] food and some canned foods.  Check food labels (Nutrition Facts labels) on foods to learn how much sodium is in one serving.  Eat more home-cooked food and less restaurant, buffet, and fast food.  When eating at a restaurant, ask that your food be prepared with less salt or none, if possible.   HOW DO I READ FOOD LABELS FOR SODIUM INFORMATION? The Nutrition Facts label lists the amount of sodium in one serving of the food. If you eat more than one serving, you must multiply the listed amount of sodium by the number of servings. Food labels may also identify foods as:  Sodium free--Less than 5 mg in a serving.  Very low sodium--35 mg or less in a serving.  Low sodium--140 mg or less in a serving.  Light in sodium--50% less sodium in a serving. For example, if a food that usually has 300 mg of sodium is changed to become light in sodium, it will have 150 mg of sodium.  Reduced sodium--25% less sodium in a serving. For example, if a food that usually has 400 mg of sodium is changed to reduced sodium, it will have 300 mg of sodium. WHAT FOODS CAN I EAT? Grains Low-sodium cereals, including oats, puffed wheat and rice, and shredded wheat cereals. Low-sodium crackers. Unsalted rice and pasta. Lower-sodium bread.  Vegetables Frozen or fresh vegetables. Low-sodium or reduced-sodium canned vegetables. Low-sodium or reduced-sodium tomato sauce and paste. Low-sodium or reduced-sodium tomato and vegetable juices.  Fruits Fresh, frozen, and canned fruit. Fruit juice.  Meat and Other Protein Products Low-sodium canned tuna and salmon. Fresh or frozen meat, poultry, seafood, and fish. Lamb. Unsalted nuts. Dried beans, peas, and lentils without added salt. Unsalted canned beans. Homemade soups without salt. Eggs.  Dairy Milk. Soy milk. Ricotta cheese. Low-sodium or reduced-sodium cheeses. Yogurt.  Condiments Fresh and dried herbs and spices. Salt-free seasonings. Onion and garlic powders. Low-sodium varieties of mustard and ketchup. Lemon juice.  Fats and Oils Reduced-sodium salad dressings. Unsalted butter.  Other Unsalted popcorn and pretzels.  The items listed above may not be a complete list of recommended foods or beverages. Contact your dietitian for more options. WHAT  FOODS ARE NOT RECOMMENDED? Grains Instant hot cereals. Bread stuffing, pancake, and biscuit mixes. Croutons. Seasoned rice or  pasta mixes. Noodle soup cups. Boxed or frozen macaroni and cheese. Self-rising flour. Regular salted crackers. Vegetables Regular canned vegetables. Regular canned tomato sauce and paste. Regular tomato and vegetable juices. Frozen vegetables in sauces. Salted french fries. Olives. Angie Fava. Relishes. Sauerkraut. Salsa. Meat and Other Protein Products Salted, canned, smoked, spiced, or pickled meats, seafood, or fish. Bacon, ham, sausage, hot dogs, corned beef, chipped beef, and packaged luncheon meats. Salt pork. Jerky. Pickled herring. Anchovies, regular canned tuna, and sardines. Salted nuts. Dairy Processed cheese and cheese spreads. Cheese curds. Blue cheese and cottage cheese. Buttermilk.  Condiments Onion and garlic salt, seasoned salt, table salt, and sea salt. Canned and packaged gravies. Worcestershire sauce. Tartar sauce. Barbecue sauce. Teriyaki sauce. Soy sauce, including reduced sodium. Steak sauce. Fish sauce. Oyster sauce. Cocktail sauce. Horseradish. Regular ketchup and mustard. Meat flavorings and tenderizers. Bouillon cubes. Hot sauce. Tabasco sauce. Marinades. Taco seasonings. Relishes. Fats and Oils Regular salad dressings. Salted butter. Margarine. Ghee. Bacon fat.  Other Potato and tortilla chips. Corn chips and puffs. Salted popcorn and pretzels. Canned or dried soups. Pizza. Frozen entrees and pot pies.  The items listed above may not be a complete list of foods and beverages to avoid. Contact your dietitian for more information. Document Released: 07/06/2001 Document Revised: 01/19/2013 Document Reviewed: 11/18/2012 [facility] Patient Information 2015 [place], [place]. This information is not intended to replace advice given to you by your health care provider. Make sure you discuss any questions you have with your health care provider.

## 2013-09-06 NOTE — Progress Notes (Signed)
Pre visit review using our clinic review tool, if applicable. No additional management support is needed unless otherwise documented below in the visit note. 

## 2013-09-06 NOTE — Progress Notes (Signed)
Subjective:    Patient ID: Sally Douglas, female    DOB: December 12, 1945, 68 y.o.   MRN: [id number]  HPI 68 year old patient who has treated hypertension, ongoing tobacco use, and chronic low back pain.  She is followed by Dr. Ronnald Ramp and has had a fairly recent lumbar MRI. Her chief complaint today is lower extremity edema present for the past 2 or 3 weeks.  She has been on hydrochlorothiazide in the past, but this was discontinued due to leg cramps.  The leg cramps continue unchanged. Denies any pulmonary complaints.  Denies any shortness or breath, DOE, orthopnea.  Medical regimen also includes meloxicam.  She is also on diltiazem  Past Medical History  Diagnosis Date  . Insomnia     takes Ambien nightly  . Obesity   . Menopausal syndrome   . Tobacco abuse   . Low back pain   . Bronchitis     hx of-many yrs ago  . Abnormal finding on Pap smear     hx abnl pap  . Hypertension     takes Diltiazem and HCTZ daily  . Constipation     takes Miralax daily  . Arthritis   . Joint pain   . Joint swelling     History   Social History  . Marital Status: Married    Spouse Name: N/A    Number of Children: N/A  . Years of Education: N/A   Occupational History  . Not on file.   Social History Main Topics  . Smoking status: Current Every Day Smoker -- 0.50 packs/day for 25 years    Types: Cigarettes  . Smokeless tobacco: Never Used  . Alcohol Use: No     Comment: occassional  . Drug Use: No  . Sexual Activity: Not Currently    Birth Control/ Protection: Post-menopausal   Other Topics Concern  . Not on file   Social History Narrative   gavidia 1   Para 1    aborta 0    Past Surgical History  Procedure Laterality Date  . Tonsillectomy    . Dilation and curettage of uterus    . Ventral hernia repair  01/24/2011    Procedure: LAPAROSCOPIC VENTRAL HERNIA;  Surgeon: Adin Hector, MD;  Location: [facility];  Service: General;  Laterality: N/A;  with Mesh  . Maximum access  (mas)posterior lumbar interbody fusion (plif) 1 level N/A 11/04/2012    Procedure: Lumbar four-five Maximum access posterior lumbar interbody fusion with Nuvasive;  Surgeon: Eustace Moore, MD;  Location: [facility];  Service: Neurosurgery;  Laterality: N/A;  Lumbar four-five Maximum access posterior lumbar interbody fusion with Nuvasive    Family History  Problem Relation Age of Onset  . Cancer Brother     renal ,also sister  . COPD Brother   . Hypertension Brother   . Diabetes Brother   . Kidney disease Brother   . Diabetes Brother     and sister  . Breast cancer      sister  . Kidney disease    . Cancer Mother     Breast Cancer  . Diabetes Mother   . Hypertension Mother   . Glaucoma Mother   . Diabetes Sister   . Hypertension Sister   . Heart disease Sister     No Known Allergies  Current Outpatient Prescriptions on File Prior to Visit  Medication Sig Dispense Refill  . amitriptyline (ELAVIL) 25 MG tablet TAKE 1 TO 2 TABLETS BY MOUTH  TO CONTROL PAIN BUT NOT GET TOO SLEEPY  270 tablet  1  . aspirin 81 MG tablet Take 81 mg by mouth every morning.       [name] lisinopril (PRINIVIL,ZESTRIL) 20 MG tablet Take 1 tablet (20 mg total) by mouth daily.  90 tablet  3  . polyethylene glycol powder (GLYCOLAX/MIRALAX) powder Take 17 g by mouth daily.      [name] TAZTIA XT 360 MG 24 hr capsule TAKE 1 CAPSULE BY MOUTH EVERY MORNING  90 capsule  1  . zolpidem (AMBIEN) 10 MG tablet TAKE 1 TABLET BY MOUTH EVERY NIGHT AT BEDTIME AS NEEDED FOR SLEEP  30 tablet  5  . Diphenhyd-Hydrocort-Nystatin (FIRST-DUKES MOUTHWASH) SUSP Use as directed 5 mLs in the mouth or throat 4 (four) times daily.  120 mL  0   No current facility-administered medications on file prior to visit.    BP 140/86  Pulse 69  Temp(Src) 98.1 F (36.7 C) (Oral)  Resp 20  Ht 4' 8.75" (1.441 m)  Wt 187 lb (84.823 kg)  BMI 40.85 kg/m2  SpO2 95%       Review of Systems  Constitutional: Negative.   HENT: Negative for  congestion, dental problem, hearing loss, rhinorrhea, sinus pressure, sore throat and tinnitus.   Eyes: Negative for pain, discharge and visual disturbance.  Respiratory: Negative for cough and shortness of breath.   Cardiovascular: Positive for leg swelling. Negative for chest pain and palpitations.  Gastrointestinal: Negative for nausea, vomiting, abdominal pain, diarrhea, constipation, blood in stool and abdominal distention.  Genitourinary: Negative for dysuria, urgency, frequency, hematuria, flank pain, vaginal bleeding, vaginal discharge, difficulty urinating, vaginal pain and pelvic pain.  Musculoskeletal: Positive for back pain. Negative for arthralgias, gait problem and joint swelling.  Skin: Negative for rash.  Neurological: Negative for dizziness, syncope, speech difficulty, weakness, numbness and headaches.  Hematological: Negative for adenopathy.  Psychiatric/Behavioral: Negative for behavioral problems, dysphoric mood and agitation. The patient is not nervous/anxious.        Objective:   Physical Exam  Constitutional: She is oriented to person, place, and time. She appears well-developed and well-nourished.  Obese blood pressure 140/90   HENT:  Head: Normocephalic.  Right Ear: External ear normal.  Left Ear: External ear normal.  Mouth/Throat: Oropharynx is clear and moist.  Eyes: Conjunctivae and EOM are normal. Pupils are equal, round, and reactive to light.  Neck: Normal range of motion. Neck supple. No thyromegaly present.  Cardiovascular: Normal rate, regular rhythm, normal heart sounds and intact distal pulses.   Pulmonary/Chest: Effort normal and breath sounds normal.  Abdominal: Soft. Bowel sounds are normal. She exhibits no mass. There is no tenderness.  Musculoskeletal: Normal range of motion. She exhibits edema.  Prominent plus 2 pitting edema distal to the knees bilaterally  Lymphadenopathy:    She has no cervical adenopathy.  Neurological: She is alert and  oriented to person, place, and time.  Skin: Skin is warm and dry. No rash noted.  Psychiatric: She has a normal mood and affect. Her behavior is normal.          Assessment & Plan:   Lower extremity edema.  Will add diuretic therapy low salt diet recommended Hypertension.  Should improve with diuresis Chronic low back pain Osteoarthritis Obesity Ongoing tobacco use.  Total smoking cessation encouraged

## 2013-09-06 NOTE — Telephone Encounter (Signed)
Relevant patient education assigned to patient using Emmi. ° °

## 2013-09-20 DIAGNOSIS — M549 Dorsalgia, unspecified: Secondary | ICD-10-CM | POA: Diagnosis not present

## 2013-09-20 DIAGNOSIS — Z6841 Body Mass Index (BMI) 40.0 and over, adult: Secondary | ICD-10-CM | POA: Diagnosis not present

## 2013-11-11 DIAGNOSIS — Z6841 Body Mass Index (BMI) 40.0 and over, adult: Secondary | ICD-10-CM | POA: Diagnosis not present

## 2013-11-11 DIAGNOSIS — M4806 Spinal stenosis, lumbar region: Secondary | ICD-10-CM | POA: Diagnosis not present

## 2013-11-11 DIAGNOSIS — M545 Low back pain: Secondary | ICD-10-CM | POA: Diagnosis not present

## 2013-11-12 ENCOUNTER — Other Ambulatory Visit: Payer: Self-pay | Admitting: Internal Medicine

## 2013-11-29 ENCOUNTER — Encounter: Payer: Self-pay | Admitting: Internal Medicine

## 2013-11-29 ENCOUNTER — Encounter: Payer: Self-pay | Admitting: *Deleted

## 2013-11-29 ENCOUNTER — Ambulatory Visit (INDEPENDENT_AMBULATORY_CARE_PROVIDER_SITE_OTHER): Payer: Medicare Other | Admitting: Internal Medicine

## 2013-11-29 VITALS — BP 130/70 | HR 70 | Temp 98.3°F | Resp 20 | Ht <= 58 in | Wt 182.0 lb

## 2013-11-29 DIAGNOSIS — R600 Localized edema: Secondary | ICD-10-CM

## 2013-11-29 DIAGNOSIS — I10 Essential (primary) hypertension: Secondary | ICD-10-CM

## 2013-11-29 DIAGNOSIS — Z0000 Encounter for general adult medical examination without abnormal findings: Secondary | ICD-10-CM

## 2013-11-29 DIAGNOSIS — Z23 Encounter for immunization: Secondary | ICD-10-CM | POA: Diagnosis not present

## 2013-11-29 DIAGNOSIS — E669 Obesity, unspecified: Secondary | ICD-10-CM

## 2013-11-29 DIAGNOSIS — E785 Hyperlipidemia, unspecified: Secondary | ICD-10-CM

## 2013-11-29 DIAGNOSIS — E6609 Other obesity due to excess calories: Secondary | ICD-10-CM

## 2013-11-29 DIAGNOSIS — M545 Low back pain: Secondary | ICD-10-CM | POA: Diagnosis not present

## 2013-11-29 DIAGNOSIS — G8929 Other chronic pain: Secondary | ICD-10-CM

## 2013-11-29 LAB — CBC WITH DIFFERENTIAL/PLATELET
Basophils Absolute: 0.1 10*3/uL (ref 0.0–0.1)
Basophils Relative: 0.5 % (ref 0.0–3.0)
EOS ABS: 0.4 10*3/uL (ref 0.0–0.7)
EOS PCT: 3.4 % (ref 0.0–5.0)
HCT: 39.1 % (ref 36.0–46.0)
Hemoglobin: 12.7 g/dL (ref 12.0–15.0)
LYMPHS PCT: 25.9 % (ref 12.0–46.0)
Lymphs Abs: 3.4 10*3/uL (ref 0.7–4.0)
MCHC: 32.5 g/dL (ref 30.0–36.0)
MCV: 87.3 fl (ref 78.0–100.0)
Monocytes Absolute: 0.8 10*3/uL (ref 0.1–1.0)
Monocytes Relative: 6.3 % (ref 3.0–12.0)
NEUTROS PCT: 63.9 % (ref 43.0–77.0)
Neutro Abs: 8.3 10*3/uL — ABNORMAL HIGH (ref 1.4–7.7)
Platelets: 309 10*3/uL (ref 150.0–400.0)
RBC: 4.48 Mil/uL (ref 3.87–5.11)
RDW: 15.3 % (ref 11.5–15.5)
WBC: 12.9 10*3/uL — AB (ref 4.0–10.5)

## 2013-11-29 LAB — COMPREHENSIVE METABOLIC PANEL
ALBUMIN: 3.5 g/dL (ref 3.5–5.2)
ALT: 15 U/L (ref 0–35)
AST: 17 U/L (ref 0–37)
Alkaline Phosphatase: 99 U/L (ref 39–117)
BUN: 19 mg/dL (ref 6–23)
CALCIUM: 9.7 mg/dL (ref 8.4–10.5)
CHLORIDE: 103 meq/L (ref 96–112)
CO2: 27 meq/L (ref 19–32)
CREATININE: 1.3 mg/dL — AB (ref 0.4–1.2)
GFR: 44.4 mL/min — AB (ref >60.00)
Glucose, Bld: 92 mg/dL (ref 70–99)
POTASSIUM: 5.2 meq/L — AB (ref 3.5–5.1)
Sodium: 140 mEq/L (ref 135–145)
Total Bilirubin: 0.4 mg/dL (ref 0.2–1.2)
Total Protein: 7.5 g/dL (ref 6.0–8.3)

## 2013-11-29 LAB — LIPID PANEL
CHOL/HDL RATIO: 3
Cholesterol: 156 mg/dL (ref 0–200)
HDL: 56.3 mg/dL (ref >39.00)
LDL Cholesterol: 74 mg/dL (ref 0–99)
NonHDL: 99.7
Triglycerides: 129 mg/dL (ref 0.0–149.0)
VLDL: 25.8 mg/dL (ref 0.0–40.0)

## 2013-11-29 MED ORDER — HYDROCHLOROTHIAZIDE 25 MG PO TABS: 25.0000 mg | ORAL_TABLET | Freq: Every day | ORAL | Status: DC

## 2013-11-29 MED ORDER — ZOLPIDEM TARTRATE 10 MG PO TABS: ORAL_TABLET | ORAL | Status: DC

## 2013-11-29 NOTE — Patient Instructions (Signed)
Limit your sodium (Salt) intake    It is important that you exercise regularly, at least 20 minutes 3 to 4 times per week.  If you develop chest pain or shortness of breath seek  medical attention.  You need to lose weight.  Consider a lower calorie diet and regular exercise.  Please check your blood pressure on a regular basis.  If it is consistently greater than 150/90, please make an office appointment.  Health Maintenance Adopting a healthy lifestyle and getting preventive care can go a long way to promote health and wellness. Talk with your health care provider about what schedule of regular examinations is right for you. This is a good chance for you to check in with your provider about disease prevention and staying healthy. In between checkups, there are plenty of things you can do on your own. Experts have done a lot of research about which lifestyle changes and preventive measures are most likely to keep you healthy. Ask your health care provider for more information. WEIGHT AND DIET  Eat a healthy diet  Be sure to include plenty of vegetables, fruits, low-fat dairy products, and lean protein.  Do not eat a lot of foods high in solid fats, added sugars, or salt.  Get regular exercise. This is one of the most important things you can do for your health.  Most adults should exercise for at least 150 minutes each week. The exercise should increase your heart rate and make you sweat (moderate-intensity exercise).  Most adults should also do strengthening exercises at least twice a week. This is in addition to the moderate-intensity exercise.  Maintain a healthy weight  Body mass index (BMI) is a measurement that can be used to identify possible weight problems. It estimates body fat based on height and weight. Your health care provider can help determine your BMI and help you achieve or maintain a healthy weight.  For females 20 years of age and older:   A BMI below 18.5 is  considered underweight.  A BMI of 18.5 to 24.9 is normal.  A BMI of 25 to 29.9 is considered overweight.  A BMI of 30 and above is considered obese.  Watch levels of cholesterol and blood lipids  You should start having your blood tested for lipids and cholesterol at 68 years of age, then have this test every 5 years.  You may need to have your cholesterol levels checked more often if:  Your lipid or cholesterol levels are high.  You are older than 68 years of age.  You are at high risk for heart disease.  CANCER SCREENING   Lung Cancer  Lung cancer screening is recommended for adults 55-80 years old who are at high risk for lung cancer because of a history of smoking.  A yearly low-dose CT scan of the lungs is recommended for people who:  Currently smoke.  Have quit within the past 15 years.  Have at least a 30-pack-year history of smoking. A pack year is smoking an average of one pack of cigarettes a day for 1 year.  Yearly screening should continue until it has been 15 years since you quit.  Yearly screening should stop if you develop a health problem that would prevent you from having lung cancer treatment.  Breast Cancer  Practice breast self-awareness. This means understanding how your breasts normally appear and feel.  It also means doing regular breast self-exams. Let your health care provider know about any changes, no matter   small.  If you are in your 20s or 30s, you should have a clinical breast exam (CBE) by a health care provider every 1-3 years as part of a regular health exam.  If you are 40 or older, have a CBE every year. Also consider having a breast X-ray (mammogram) every year.  If you have a family history of breast cancer, talk to your health care provider about genetic screening.  If you are at high risk for breast cancer, talk to your health care provider about having an MRI and a mammogram every year.  Breast cancer gene (BRCA)  assessment is recommended for women who have family members with BRCA-related cancers. BRCA-related cancers include:  Breast.  Ovarian.  Tubal.  Peritoneal cancers.  Results of the assessment will determine the need for genetic counseling and BRCA1 and BRCA2 testing. Cervical Cancer Routine pelvic examinations to screen for cervical cancer are no longer recommended for nonpregnant women who are considered low risk for cancer of the pelvic organs (ovaries, uterus, and vagina) and who do not have symptoms. A pelvic examination may be necessary if you have symptoms including those associated with pelvic infections. Ask your health care provider if a screening pelvic exam is right for you.   The Pap test is the screening test for cervical cancer for women who are considered at risk.  If you had a hysterectomy for a problem that was not cancer or a condition that could lead to cancer, then you no longer need Pap tests.  If you are older than 65 years, and you have had normal Pap tests for the past 10 years, you no longer need to have Pap tests.  If you have had past treatment for cervical cancer or a condition that could lead to cancer, you need Pap tests and screening for cancer for at least 20 years after your treatment.  If you no longer get a Pap test, assess your risk factors if they change (such as having a new sexual partner). This can affect whether you should start being screened again.  Some women have medical problems that increase their chance of getting cervical cancer. If this is the case for you, your health care provider may recommend more frequent screening and Pap tests.  The human papillomavirus (HPV) test is another test that may be used for cervical cancer screening. The HPV test looks for the virus that can cause cell changes in the cervix. The cells collected during the Pap test can be tested for HPV.  The HPV test can be used to screen women 62 years of age and older.  Getting tested for HPV can extend the interval between normal Pap tests from three to five years.  An HPV test also should be used to screen women of any age who have unclear Pap test results.  After 68 years of age, women should have HPV testing as often as Pap tests.  Colorectal Cancer  This type of cancer can be detected and often prevented.  Routine colorectal cancer screening usually begins at 68 years of age and continues through 68 years of age.  Your health care provider may recommend screening at an earlier age if you have risk factors for colon cancer.  Your health care provider may also recommend using home test kits to check for hidden blood in the stool.  A small camera at the end of a tube can be used to examine your colon directly (sigmoidoscopy or colonoscopy). This is done to  to check for the earliest forms of colorectal cancer.  Routine screening usually begins at age 50.  Direct examination of the colon should be repeated every 5-10 years through 68 years of age. However, you may need to be screened more often if early forms of precancerous polyps or small growths are found. Skin Cancer  Check your skin from head to toe regularly.  Tell your health care provider about any new moles or changes in moles, especially if there is a change in a mole's shape or color.  Also tell your health care provider if you have a mole that is larger than the size of a pencil eraser.  Always use sunscreen. Apply sunscreen liberally and repeatedly throughout the day.  Protect yourself by wearing long sleeves, pants, a wide-brimmed hat, and sunglasses whenever you are outside. HEART DISEASE, DIABETES, AND HIGH BLOOD PRESSURE   Have your blood pressure checked at least every 1-2 years. High blood pressure causes heart disease and increases the risk of stroke.  If you are between 55 years and 79 years old, ask your health care provider if you should take aspirin to prevent  strokes.  Have regular diabetes screenings. This involves taking a blood sample to check your fasting blood sugar level.  If you are at a normal weight and have a low risk for diabetes, have this test once every three years after 68 years of age.  If you are overweight and have a high risk for diabetes, consider being tested at a younger age or more often. PREVENTING INFECTION  Hepatitis B  If you have a higher risk for hepatitis B, you should be screened for this virus. You are considered at high risk for hepatitis B if:  You were born in a country where hepatitis B is common. Ask your health care provider which countries are considered high risk.  Your parents were born in a high-risk country, and you have not been immunized against hepatitis B (hepatitis B vaccine).  You have HIV or AIDS.  You use needles to inject street drugs.  You live with someone who has hepatitis B.  You have had sex with someone who has hepatitis B.  You get hemodialysis treatment.  You take certain medicines for conditions, including cancer, organ transplantation, and autoimmune conditions. Hepatitis C  Blood testing is recommended for:  Everyone born from 1945 through 1965.  Anyone with known risk factors for hepatitis C. Sexually transmitted infections (STIs)  You should be screened for sexually transmitted infections (STIs) including gonorrhea and chlamydia if:  You are sexually active and are younger than 68 years of age.  You are older than 68 years of age and your health care provider tells you that you are at risk for this type of infection.  Your sexual activity has changed since you were last screened and you are at an increased risk for chlamydia or gonorrhea. Ask your health care provider if you are at risk.  If you do not have HIV, but are at risk, it may be recommended that you take a prescription medicine daily to prevent HIV infection. This is called pre-exposure prophylaxis  (PrEP). You are considered at risk if:  You are sexually active and do not regularly use condoms or know the HIV status of your partner(s).  You take drugs by injection.  You are sexually active with a partner who has HIV. Talk with your health care provider about whether you are at high risk of being infected with   If you choose to begin PrEP, you should first be tested for HIV. You should then be tested every 3 months for as long as you are taking PrEP.  PREGNANCY   If you are premenopausal and you may become pregnant, ask your health care provider about preconception counseling.  If you may become pregnant, take 400 to 800 micrograms (mcg) of folic acid every day.  If you want to prevent pregnancy, talk to your health care provider about birth control (contraception). OSTEOPOROSIS AND MENOPAUSE   Osteoporosis is a disease in which the bones lose minerals and strength with aging. This can result in serious bone fractures. Your risk for osteoporosis can be identified using a bone density scan.  If you are 65 years of age or older, or if you are at risk for osteoporosis and fractures, ask your health care provider if you should be screened.  Ask your health care provider whether you should take a calcium or vitamin D supplement to lower your risk for osteoporosis.  Menopause may have certain physical symptoms and risks.  Hormone replacement therapy may reduce some of these symptoms and risks. Talk to your health care provider about whether hormone replacement therapy is right for you.  HOME CARE INSTRUCTIONS   Schedule regular health, dental, and eye exams.  Stay current with your immunizations.   Do not use any tobacco products including cigarettes, chewing tobacco, or electronic cigarettes.  If you are pregnant, do not drink alcohol.  If you are breastfeeding, limit how much and how often you drink alcohol.  Limit alcohol intake to no more than 1 drink per day for  nonpregnant women. One drink equals 12 ounces of beer, 5 ounces of wine, or 1 ounces of hard liquor.  Do not use street drugs.  Do not share needles.  Ask your health care provider for help if you need support or information about quitting drugs.  Tell your health care provider if you often feel depressed.  Tell your health care provider if you have ever been abused or do not feel safe at home. Document Released: 07/30/2010 Document Revised: 05/31/2013 Document Reviewed: 12/16/2012 [facility] Patient Information 2015 [place], [place]. This information is not intended to replace advice given to you by your health care provider. Make sure you discuss any questions you have with your health care provider.

## 2013-11-29 NOTE — Progress Notes (Signed)
Subjective:    Patient ID: Sally Douglas, female    DOB: [birth year], 68 y.o.   MRN: [id number]  HPI     Subjective:    Patient ID: Sally Douglas, female    DOB: 1946/01/11, 68 y.o.   MRN: [id number]  HPI      CC: CPX and labs done.[name]  History of Present Illness:   68  year-old patient is seen today for an annual examination.  Medical problems include hypertension, ongoing tobacco use. She has a history of exogenous obesity and chronic low back pain. She is now followed at the chronic pain center and is on oxycodone.  No new concerns or complaints. No prior colonoscopy, which she again adamantly declines today.  Her mother had a history of breast cancer. No recent mammograms She had back surgery about 1 year ago for severe spinal stenosis.  She continues to have significant low back pain.  Preventive Screening-Counseling & Management  Caffeine-Diet-Exercise  Does Patient Exercise: no   Problems Prior to Update:   1) Family History Breast Cancer 1st Degree Relative <50 (ICD-V16.3)  2) Physical Examination (ICD-V70.0)  3) Back Pain, Chronic (ICD-724.5)  4) Obesity (ICD-278.00)  5) Hypertension (ICD-401.9)   Medications Prior to Update:  1) Ambien 10 Mg Tabs (Zolpidem Tartrate) .... Take 1 Tablet By Mouth At Bedtime  2) Hydrochlorothiazide 25 Mg Tabs (Hydrochlorothiazide) .[name].. 1 Once Daily  3) Meloxicam 15 Mg Tabs (Meloxicam) .[name].. 1 Once Daily  4) Skelaxin 800 Mg Tabs (Metaxalone) .... One Twice Daily As Needed  5) Diltiazem Hcl Er Beads 360 Mg Cp24 (Diltiazem Hcl Er Beads) .... One By Mouth Daily  6) Vicodin 5-500 Mg Tabs (Hydrocodone-Acetaminophen) .... One By Mouth Q 4-6 Hrs As Needed Pain   Allergies:  No Known Drug Allergies   Past History:  Past Medical History:   Hypertension  Insomnia  Obesity  Menopausal syndrome  tobacco use  low back pain  history bronchitis  history of impaired glucose tolerance  history of abnormal Pap   Past Surgical History:  GYN  surgery  Tonsillectomy age 62  status post D&C  gravida one, para one, aborta zero  colonoscopy-declines  Ventral hernia repair 2012  Family History:   father died age 39, cerebral, hemorrhage;  mother died age 51 History, diabetes, breast cancer.  One brother two sisters positive for renal cancer, diabetes, and breast cancer  Family History Breast cancer 1st degree relative <50  Family History Kidney disease   Social History:   Married  Current Smoker-  Regular exercise-no  Does Patient Exercise: no   Past Medical History  Diagnosis Date  . Insomnia     takes Ambien nightly  . Obesity   . Menopausal syndrome   . Tobacco abuse   . Low back pain   . Bronchitis     hx of-many yrs ago  . Abnormal finding on Pap smear     hx abnl pap  . Hypertension     takes Diltiazem and HCTZ daily  . Constipation     takes Miralax daily  . Arthritis   . Joint pain   . Joint swelling     History   Social History  . Marital Status: Married    Spouse Name: N/A    Number of Children: N/A  . Years of Education: N/A   Occupational History  . Not on file.   Social History Main Topics  . Smoking status: Former Smoker -- 0.50 packs/day for  25 years    Types: Cigarettes    Quit date: 10/29/2012  . Smokeless tobacco: Never Used     Comment: Electronic Cigarettes  . Alcohol Use: No     Comment: occassional  . Drug Use: No  . Sexual Activity: Not Currently    Birth Control/ Protection: Post-menopausal   Other Topics Concern  . Not on file   Social History Narrative   gavidia 1   Para 1    aborta 0    Past Surgical History  Procedure Laterality Date  . Tonsillectomy    . Dilation and curettage of uterus    . Ventral hernia repair  01/24/2011    Procedure: LAPAROSCOPIC VENTRAL HERNIA;  Surgeon: Adin Hector, MD;  Location: [facility];  Service: General;  Laterality: N/A;  with Mesh  . Maximum access (mas)posterior lumbar interbody fusion (plif) 1 level N/A 11/04/2012     Procedure: Lumbar four-five Maximum access posterior lumbar interbody fusion with Nuvasive;  Surgeon: Eustace Moore, MD;  Location: [facility];  Service: Neurosurgery;  Laterality: N/A;  Lumbar four-five Maximum access posterior lumbar interbody fusion with Nuvasive    Family History  Problem Relation Age of Onset  . Cancer Brother     renal ,also sister  . COPD Brother   . Hypertension Brother   . Diabetes Brother   . Kidney disease Brother   . Diabetes Brother     and sister  . Breast cancer      sister  . Kidney disease    . Cancer Mother     Breast Cancer  . Diabetes Mother   . Hypertension Mother   . Glaucoma Mother   . Diabetes Sister   . Hypertension Sister   . Heart disease Sister     Allergies  Allergen Reactions  . K-Dur [Potassium Chloride] Swelling    Face Swelling    Current Outpatient Prescriptions on File Prior to Visit  Medication Sig Dispense Refill  . amitriptyline (ELAVIL) 25 MG tablet TAKE 1 TO 2 TABLETS BY MOUTH TO CONTROL PAIN BUT NOT GET TOO SLEEPY 270 tablet 1  . aspirin 81 MG tablet Take 81 mg by mouth every morning.     . furosemide (LASIX) 20 MG tablet TAKE 1 TABLET BY MOUTH EVERY DAY 90 tablet 0  . lisinopril (PRINIVIL,ZESTRIL) 20 MG tablet Take 1 tablet (20 mg total) by mouth daily. 90 tablet 3  . meloxicam (MOBIC) 7.5 MG tablet Take 7.5 mg by mouth 2 (two) times daily. Prescribed by Dr. Maryjean Ka    . polyethylene glycol powder (GLYCOLAX/MIRALAX) powder Take 17 g by mouth daily.    [name] TAZTIA XT 360 MG 24 hr capsule TAKE 1 CAPSULE BY MOUTH EVERY MORNING 90 capsule 1   No current facility-administered medications on file prior to visit.    BP 130/70 mmHg  Pulse 70  Temp(Src) 98.3 F (36.8 C) (Oral)  Resp 20  Ht 4' 8.75" (1.441 m)  Wt 182 lb (82.555 kg)  BMI 39.76 kg/m2  SpO2 97%     1. Risk factors, based on past  M,S,F history- cardiovascular risk factors include hypertension and ongoing tobacco use  2.  Physical activities: No  exercise limitations but very sedentary. Does have chronic low back pain  3.  Depression/mood: No history of depression or mood disorder  4.  Hearing: No significant deficits  5.  ADL's: Independent in all aspects of daily living  6.  Fall risk: Moderate due to obesity.  Walks with a cane due to severe back pain  7.  Home safety: No problems identified  8.  Height weight, and visual acuity; height and weight stable no change in visual acuity  9.  Counseling: Regular exercise weight loss encouraged  10. Lab orders based on risk factors: Lipid profile and TSH will be reviewed  11. Referral : Followup  neurosurgery  12. Care plan: Mammogram encouraged  13. Cognitive assessment: Alert and oriented with normal affect no cognitive dysfunction  14.  Preventive services will include efforts to schedule colonoscopy and follow-up mammogram.  She'll be followed at least annually by her primary care for annual exams and quarterly visits for follow-up of hypertension and other medical issues.  Patient was provided with a written and personalized care plan.  15.  Provider list includes primary care and chronic pain management.      Review of Systems  Constitutional: Negative for fever, appetite change, fatigue and unexpected weight change.  HENT: Negative for hearing loss, ear pain, nosebleeds, congestion, sore throat, mouth sores, trouble swallowing, neck stiffness, dental problem, voice change, sinus pressure and tinnitus.   Eyes: Negative for photophobia, pain, redness and visual disturbance.  Respiratory: Negative for cough, chest tightness and shortness of breath.   Cardiovascular: Negative for chest pain, palpitations and leg swelling.  Gastrointestinal: Positive for abdominal pain. Negative for nausea, vomiting, diarrhea, constipation, blood in stool, abdominal distention and rectal pain.  Genitourinary: Negative for dysuria, urgency, frequency, hematuria, flank pain, vaginal  bleeding, vaginal discharge, difficulty urinating, genital sores, vaginal pain, menstrual problem and pelvic pain.  Musculoskeletal: Positive for back pain. Negative for arthralgias.  Skin: Negative for rash.  Neurological: Negative for dizziness, syncope, speech difficulty, weakness, light-headedness, numbness and headaches.  Hematological: Negative for adenopathy. Does not bruise/bleed easily.  Psychiatric/Behavioral: Negative for suicidal ideas, behavioral problems, self-injury, dysphoric mood and agitation. The patient is not nervous/anxious.        Objective:   Physical Exam  Constitutional: She is oriented to person, place, and time. She appears well-developed and well-nourished.  HENT:  Head: Normocephalic and atraumatic.  Right Ear: External ear normal.  Left Ear: External ear normal.  Mouth/Throat: Oropharynx is clear and moist.  Eyes: Conjunctivae and EOM are normal.  Neck: Normal range of motion. Neck supple. No JVD present. No thyromegaly present.  Cardiovascular: Normal rate, regular rhythm, normal heart sounds and intact distal pulses.   No murmur heard. Left dorsalis pedis pulse diminished  Pulmonary/Chest: Effort normal and breath sounds normal. She has no wheezes. She has no rales.  Abdominal: Soft. Bowel sounds are normal. She exhibits no distension and no mass. There is no tenderness. There is no rebound and no guarding.   Nicely healed laparoscopic scars  Musculoskeletal: Normal range of motion. She exhibits no edema and no tenderness.  Right bunion  Neurological: She is alert and oriented to person, place, and time. She has normal reflexes. No cranial nerve deficit. She exhibits normal muscle tone. Coordination normal.  Skin: Skin is warm and dry. No rash noted.  Onychomycotic toenails  Psychiatric: She has a normal mood and affect. Her behavior is normal.          Assessment & Plan:   Preventive health examination Status post laparoscopic repair of  incarcerated ventral hernia Chronic low back pain Exogenous obesity Ongoing tobacco use. Smoking cessation encouraged Annual mammogram encouraged   We'll check a lipid profile TSH Recheck 6 months Medications refill  Review of Systems  Objective:   Physical Exam  Musculoskeletal: She exhibits edema.  Plus 2 lower extremity edema          Assessment & Plan:   Preventive health examination Chronic low back pain Hypertension, well-controlled today.  She states that she has itching and bruising on lisinopril.  She wishes to switch back to hydrochlorothiazide.  The patient does have worsening lower extremity edema in spite of occasional furosemide uses Obesity

## 2013-11-29 NOTE — Progress Notes (Signed)
Pre visit review using our clinic review tool, if applicable. No additional management support is needed unless otherwise documented below in the visit note. 

## 2013-11-30 LAB — TSH: TSH: 0.59 u[IU]/mL (ref 0.35–4.50)

## 2014-02-03 DIAGNOSIS — Z6838 Body mass index (BMI) 38.0-38.9, adult: Secondary | ICD-10-CM | POA: Diagnosis not present

## 2014-02-03 DIAGNOSIS — M4806 Spinal stenosis, lumbar region: Secondary | ICD-10-CM | POA: Diagnosis not present

## 2014-02-03 DIAGNOSIS — M545 Low back pain: Secondary | ICD-10-CM | POA: Diagnosis not present

## 2014-02-03 DIAGNOSIS — R030 Elevated blood-pressure reading, without diagnosis of hypertension: Secondary | ICD-10-CM | POA: Diagnosis not present

## 2014-02-03 DIAGNOSIS — I10 Essential (primary) hypertension: Secondary | ICD-10-CM | POA: Diagnosis not present

## 2014-02-07 ENCOUNTER — Encounter: Payer: Self-pay | Admitting: Internal Medicine

## 2014-02-08 NOTE — Telephone Encounter (Signed)
Kenney Houseman, can you find out what alternative medications Dr.K can change pt to. I Zolpidem has to change to Trazodone, but we don't know the others. Thanks

## 2014-02-11 NOTE — Telephone Encounter (Signed)
PA for amitriptyline and zolpidem was faxed.

## 2014-02-15 NOTE — Telephone Encounter (Signed)
PA request for Zolpidem was submitted and I received a fax back stating the medication is covered on the 2016 formulary quantity limit 30 per 30 days and the PA received was not needed. The PA request for amitriptyline was denied.  The medication is not covered by the FDA for pain.  It is only approved for the treatment for depression.

## 2014-02-16 ENCOUNTER — Encounter: Payer: Self-pay | Admitting: Internal Medicine

## 2014-02-16 NOTE — Telephone Encounter (Signed)
Dr. Raliegh Ip, need to change Amitriptyline it was denied, pt takes it for pain, substitution is Wellbutrin. Please advise.

## 2014-02-17 MED ORDER — ZOLPIDEM TARTRATE 10 MG PO TABS: ORAL_TABLET | ORAL | Status: DC

## 2014-02-17 MED ORDER — DILTIAZEM HCL ER BEADS 360 MG PO CP24: 360.0000 mg | ORAL_CAPSULE | Freq: Every day | ORAL | Status: DC

## 2014-02-17 MED ORDER — NORTRIPTYLINE HCL 25 MG PO CAPS: 25.0000 mg | ORAL_CAPSULE | Freq: Every day | ORAL | Status: DC

## 2014-02-17 NOTE — Telephone Encounter (Signed)
Discussed medication change with Dr. Raliegh Ip for Amiytriptyline and Rema Fendt, verbal order given for Nortriptyline 25 mg 1-2 capsules and Diltiazem ER 360.  Called pt and told her Rx's sent to pharmacy changed to Nortriptyline and Diltiazem and Ambien called into pharmacy did not need PA. Pt verbalized understanding.

## 2014-04-03 ENCOUNTER — Other Ambulatory Visit: Payer: Self-pay | Admitting: Internal Medicine

## 2014-04-05 ENCOUNTER — Telehealth: Payer: Self-pay | Admitting: Internal Medicine

## 2014-04-05 NOTE — Telephone Encounter (Signed)
Pt would lie a call back. She said she no longer has her rx at [facility] and would like to discuss why her pharmacy was not changed and why a rx was allowed to be filled and she no longer takes this medicine.[name]

## 2014-04-05 NOTE — Telephone Encounter (Signed)
Spoke to pt, told her it was an automatic refill from the pharmacy, sorry and I changed her pharmacy in her chart now. Pt verbalized understanding.

## 2014-04-27 DIAGNOSIS — M545 Low back pain: Secondary | ICD-10-CM | POA: Diagnosis not present

## 2014-04-27 DIAGNOSIS — M4806 Spinal stenosis, lumbar region: Secondary | ICD-10-CM | POA: Diagnosis not present

## 2014-06-01 ENCOUNTER — Ambulatory Visit (INDEPENDENT_AMBULATORY_CARE_PROVIDER_SITE_OTHER): Payer: Medicare Other | Admitting: Internal Medicine

## 2014-06-01 ENCOUNTER — Encounter: Payer: Self-pay | Admitting: Internal Medicine

## 2014-06-01 ENCOUNTER — Other Ambulatory Visit: Payer: Self-pay | Admitting: *Deleted

## 2014-06-01 VITALS — BP 130/70 | HR 68 | Temp 98.2°F | Resp 20 | Ht <= 58 in | Wt 170.0 lb

## 2014-06-01 DIAGNOSIS — Z720 Tobacco use: Secondary | ICD-10-CM | POA: Diagnosis not present

## 2014-06-01 DIAGNOSIS — I10 Essential (primary) hypertension: Secondary | ICD-10-CM

## 2014-06-01 DIAGNOSIS — F17200 Nicotine dependence, unspecified, uncomplicated: Secondary | ICD-10-CM

## 2014-06-01 MED ORDER — DILTIAZEM HCL ER BEADS 360 MG PO CP24: 360.0000 mg | ORAL_CAPSULE | Freq: Every day | ORAL | Status: DC

## 2014-06-01 NOTE — Patient Instructions (Signed)
Limit your sodium (Salt) intake  Please check your blood pressure on a regular basis.  If it is consistently greater than 150/90, please make an office appointment.    It is important that you exercise regularly, at least 20 minutes 3 to 4 times per week.  If you develop chest pain or shortness of breath seek  medical attention.  Return in 6 months for follow-up  

## 2014-06-01 NOTE — Progress Notes (Signed)
Subjective:    Patient ID: Sally Douglas, female    DOB: 1945-11-03, 69 y.o.   MRN: [id number]  HPI  Wt Readings from Last 3 Encounters:  06/01/14 170 lb (77.111 kg)  11/29/13 182 lb (82.555 kg)  09/06/13 187 lb (84.28 kg)   69 year old patient who has essential hypertension and ongoing tobacco use.  She has chronic low back pain.  In general doing pretty well.  She is followed by orthopedics.  Blood pressure has been well-controlled.  No pulmonary complaints.  Past Medical History  Diagnosis Date  . Insomnia     takes Ambien nightly  . Obesity   . Menopausal syndrome   . Tobacco abuse   . Low back pain   . Bronchitis     hx of-many yrs ago  . Abnormal finding on Pap smear     hx abnl pap  . Hypertension     takes Diltiazem and HCTZ daily  . Constipation     takes Miralax daily  . Arthritis   . Joint pain   . Joint swelling     History   Social History  . Marital Status: Married    Spouse Name: N/A  . Number of Children: N/A  . Years of Education: N/A   Occupational History  . Not on file.   Social History Main Topics  . Smoking status: Former Smoker -- 0.50 packs/day for 25 years    Types: Cigarettes    Quit date: 10/29/2012  . Smokeless tobacco: Never Used     Comment: Electronic Cigarettes  . Alcohol Use: No     Comment: occassional  . Drug Use: No  . Sexual Activity: Not Currently    Birth Control/ Protection: Post-menopausal   Other Topics Concern  . Not on file   Social History Narrative   gavidia 1   Para 1    aborta 0    Past Surgical History  Procedure Laterality Date  . Tonsillectomy    . Dilation and curettage of uterus    . Ventral hernia repair  01/24/2011    Procedure: LAPAROSCOPIC VENTRAL HERNIA;  Surgeon: Adin Hector, MD;  Location: [facility];  Service: General;  Laterality: N/A;  with Mesh  . Maximum access (mas)posterior lumbar interbody fusion (plif) 1 level N/A 11/04/2012    Procedure: Lumbar four-five Maximum access  posterior lumbar interbody fusion with Nuvasive;  Surgeon: Eustace Moore, MD;  Location: [facility];  Service: Neurosurgery;  Laterality: N/A;  Lumbar four-five Maximum access posterior lumbar interbody fusion with Nuvasive    Family History  Problem Relation Age of Onset  . Cancer Brother     renal ,also sister  . COPD Brother   . Hypertension Brother   . Diabetes Brother   . Kidney disease Brother   . Diabetes Brother     and sister  . Breast cancer      sister  . Kidney disease    . Cancer Mother     Breast Cancer  . Diabetes Mother   . Hypertension Mother   . Glaucoma Mother   . Diabetes Sister   . Hypertension Sister   . Heart disease Sister     Allergies  Allergen Reactions  . K-Dur [Potassium Chloride] Swelling    Face Swelling    Current Outpatient Prescriptions on File Prior to Visit  Medication Sig Dispense Refill  . aspirin 81 MG tablet Take 81 mg by mouth every morning.     [name]  hydrochlorothiazide (HYDRODIURIL) 25 MG tablet Take 1 tablet (25 mg total) by mouth daily. 90 tablet 3  . meloxicam (MOBIC) 7.5 MG tablet Take 7.5 mg by mouth 2 (two) times daily. Prescribed by Dr. Maryjean Ka    . nortriptyline (PAMELOR) 25 MG capsule Take 1-2 capsules (25-50 mg total) by mouth at bedtime. 180 capsule 1  . polyethylene glycol powder (GLYCOLAX/MIRALAX) powder Take 17 g by mouth daily.    [name] zolpidem (AMBIEN) 10 MG tablet TAKE 1 TABLET BY MOUTH EVERY NIGHT AT BEDTIME AS NEEDED FOR SLEEP 30 tablet 5   No current facility-administered medications on file prior to visit.    BP 130/70 mmHg  Pulse 68  Temp(Src) 98.2 F (36.8 C) (Oral)  Resp 20  Ht 4' 8.75" (1.441 m)  Wt 170 lb (77.111 kg)  BMI 37.14 kg/m2  SpO2 96%     Review of Systems  Constitutional: Negative.   HENT: Negative for congestion, dental problem, hearing loss, rhinorrhea, sinus pressure, sore throat and tinnitus.   Eyes: Negative for pain, discharge and visual disturbance.  Respiratory: Negative for  cough and shortness of breath.   Cardiovascular: Negative for chest pain, palpitations and leg swelling.  Gastrointestinal: Negative for nausea, vomiting, abdominal pain, diarrhea, constipation, blood in stool and abdominal distention.  Genitourinary: Negative for dysuria, urgency, frequency, hematuria, flank pain, vaginal bleeding, vaginal discharge, difficulty urinating, vaginal pain and pelvic pain.  Musculoskeletal: Positive for back pain. Negative for joint swelling, arthralgias and gait problem.  Skin: Negative for rash.  Neurological: Negative for dizziness, syncope, speech difficulty, weakness, numbness and headaches.  Hematological: Negative for adenopathy.  Psychiatric/Behavioral: Negative for behavioral problems, dysphoric mood and agitation. The patient is not nervous/anxious.        Objective:   Physical Exam  Constitutional: She is oriented to person, place, and time. She appears well-developed and well-nourished.  Weight 170 Blood pressure 130/70  HENT:  Head: Normocephalic.  Right Ear: External ear normal.  Left Ear: External ear normal.  Mouth/Throat: Oropharynx is clear and moist.  Eyes: Conjunctivae and EOM are normal. Pupils are equal, round, and reactive to light.  Neck: Normal range of motion. Neck supple. No thyromegaly present.  Cardiovascular: Normal rate, regular rhythm, normal heart sounds and intact distal pulses.   Pulmonary/Chest: Effort normal and breath sounds normal.  Abdominal: Soft. Bowel sounds are normal. She exhibits no mass. There is no tenderness.  Musculoskeletal: Normal range of motion.  Lymphadenopathy:    She has no cervical adenopathy.  Neurological: She is alert and oriented to person, place, and time.  Skin: Skin is warm and dry. No rash noted.  Psychiatric: She has a normal mood and affect. Her behavior is normal.          Assessment & Plan:   Essential hypertension, stable.  We'll continue present regimen.  Furosemide will be  discontinued.  She has taken this in the past for fluid retention. Chronic low back pain.  Follow-up orthopedics  Recheck 6 months

## 2014-06-01 NOTE — Progress Notes (Signed)
Pre visit review using our clinic review tool, if applicable. No additional management support is needed unless otherwise documented below in the visit note. 

## 2014-06-20 ENCOUNTER — Telehealth: Payer: Self-pay | Admitting: Family Medicine

## 2014-06-20 NOTE — Telephone Encounter (Signed)
Called and spoke to the pt to see if she has had a recent mammogram.  Pt stated she has not and she will make an appt when she is ready.  Does not want to receive any further phone calls.

## 2014-07-25 DIAGNOSIS — M4806 Spinal stenosis, lumbar region: Secondary | ICD-10-CM | POA: Diagnosis not present

## 2014-07-25 DIAGNOSIS — M545 Low back pain: Secondary | ICD-10-CM | POA: Diagnosis not present

## 2014-07-25 DIAGNOSIS — I10 Essential (primary) hypertension: Secondary | ICD-10-CM | POA: Diagnosis not present

## 2014-07-25 DIAGNOSIS — Z6838 Body mass index (BMI) 38.0-38.9, adult: Secondary | ICD-10-CM | POA: Diagnosis not present

## 2014-08-13 ENCOUNTER — Other Ambulatory Visit: Payer: Self-pay | Admitting: Internal Medicine

## 2014-10-04 DIAGNOSIS — M4806 Spinal stenosis, lumbar region: Secondary | ICD-10-CM | POA: Diagnosis not present

## 2014-10-04 DIAGNOSIS — Z6837 Body mass index (BMI) 37.0-37.9, adult: Secondary | ICD-10-CM | POA: Diagnosis not present

## 2014-10-04 DIAGNOSIS — I10 Essential (primary) hypertension: Secondary | ICD-10-CM | POA: Diagnosis not present

## 2014-10-04 DIAGNOSIS — M545 Low back pain: Secondary | ICD-10-CM | POA: Diagnosis not present

## 2014-10-16 ENCOUNTER — Emergency Department (HOSPITAL_COMMUNITY): Payer: Medicare Other | Admitting: Certified Registered"

## 2014-10-16 ENCOUNTER — Emergency Department (HOSPITAL_COMMUNITY): Payer: Medicare Other

## 2014-10-16 ENCOUNTER — Encounter (HOSPITAL_COMMUNITY): Payer: Self-pay | Admitting: *Deleted

## 2014-10-16 ENCOUNTER — Encounter (HOSPITAL_COMMUNITY): Admission: EM | Disposition: A | Discharge status: Home Health | Payer: Self-pay | Source: Home / Self Care

## 2014-10-16 ENCOUNTER — Inpatient Hospital Stay (HOSPITAL_COMMUNITY)
Admission: EM | Admit: 2014-10-16 | Discharge: 2014-10-23 | DRG: 326 | Disposition: A | Discharge status: Home Health | Payer: Medicare Other | Attending: General Surgery | Admitting: General Surgery

## 2014-10-16 DIAGNOSIS — I428 Other cardiomyopathies: Secondary | ICD-10-CM | POA: Insufficient documentation

## 2014-10-16 DIAGNOSIS — R739 Hyperglycemia, unspecified: Secondary | ICD-10-CM | POA: Diagnosis present

## 2014-10-16 DIAGNOSIS — I214 Non-ST elevation (NSTEMI) myocardial infarction: Secondary | ICD-10-CM | POA: Diagnosis not present

## 2014-10-16 DIAGNOSIS — J9811 Atelectasis: Secondary | ICD-10-CM | POA: Diagnosis not present

## 2014-10-16 DIAGNOSIS — Z6837 Body mass index (BMI) 37.0-37.9, adult: Secondary | ICD-10-CM | POA: Diagnosis not present

## 2014-10-16 DIAGNOSIS — R0602 Shortness of breath: Secondary | ICD-10-CM | POA: Diagnosis not present

## 2014-10-16 DIAGNOSIS — R7989 Other specified abnormal findings of blood chemistry: Secondary | ICD-10-CM | POA: Diagnosis not present

## 2014-10-16 DIAGNOSIS — A419 Sepsis, unspecified organism: Secondary | ICD-10-CM | POA: Diagnosis present

## 2014-10-16 DIAGNOSIS — K660 Peritoneal adhesions (postprocedural) (postinfection): Secondary | ICD-10-CM | POA: Diagnosis present

## 2014-10-16 DIAGNOSIS — E669 Obesity, unspecified: Secondary | ICD-10-CM | POA: Diagnosis present

## 2014-10-16 DIAGNOSIS — R1010 Upper abdominal pain, unspecified: Secondary | ICD-10-CM | POA: Diagnosis not present

## 2014-10-16 DIAGNOSIS — R911 Solitary pulmonary nodule: Secondary | ICD-10-CM | POA: Diagnosis not present

## 2014-10-16 DIAGNOSIS — K255 Chronic or unspecified gastric ulcer with perforation: Principal | ICD-10-CM | POA: Diagnosis present

## 2014-10-16 DIAGNOSIS — Z888 Allergy status to other drugs, medicaments and biological substances status: Secondary | ICD-10-CM | POA: Diagnosis not present

## 2014-10-16 DIAGNOSIS — K521 Toxic gastroenteritis and colitis: Secondary | ICD-10-CM | POA: Diagnosis not present

## 2014-10-16 DIAGNOSIS — J9600 Acute respiratory failure, unspecified whether with hypoxia or hypercapnia: Secondary | ICD-10-CM | POA: Diagnosis not present

## 2014-10-16 DIAGNOSIS — Z9289 Personal history of other medical treatment: Secondary | ICD-10-CM | POA: Insufficient documentation

## 2014-10-16 DIAGNOSIS — I248 Other forms of acute ischemic heart disease: Secondary | ICD-10-CM | POA: Diagnosis not present

## 2014-10-16 DIAGNOSIS — K8020 Calculus of gallbladder without cholecystitis without obstruction: Secondary | ICD-10-CM | POA: Diagnosis not present

## 2014-10-16 DIAGNOSIS — R109 Unspecified abdominal pain: Secondary | ICD-10-CM

## 2014-10-16 DIAGNOSIS — K251 Acute gastric ulcer with perforation: Secondary | ICD-10-CM | POA: Diagnosis not present

## 2014-10-16 DIAGNOSIS — R079 Chest pain, unspecified: Secondary | ICD-10-CM | POA: Diagnosis not present

## 2014-10-16 DIAGNOSIS — N179 Acute kidney failure, unspecified: Secondary | ICD-10-CM | POA: Diagnosis not present

## 2014-10-16 DIAGNOSIS — K659 Peritonitis, unspecified: Secondary | ICD-10-CM | POA: Diagnosis present

## 2014-10-16 DIAGNOSIS — I701 Atherosclerosis of renal artery: Secondary | ICD-10-CM | POA: Diagnosis not present

## 2014-10-16 DIAGNOSIS — F17210 Nicotine dependence, cigarettes, uncomplicated: Secondary | ICD-10-CM | POA: Diagnosis present

## 2014-10-16 DIAGNOSIS — I10 Essential (primary) hypertension: Secondary | ICD-10-CM | POA: Diagnosis present

## 2014-10-16 DIAGNOSIS — M799 Soft tissue disorder, unspecified: Secondary | ICD-10-CM | POA: Diagnosis not present

## 2014-10-16 DIAGNOSIS — I5189 Other ill-defined heart diseases: Secondary | ICD-10-CM | POA: Insufficient documentation

## 2014-10-16 DIAGNOSIS — E876 Hypokalemia: Secondary | ICD-10-CM | POA: Diagnosis not present

## 2014-10-16 DIAGNOSIS — R9431 Abnormal electrocardiogram [ECG] [EKG]: Secondary | ICD-10-CM | POA: Diagnosis not present

## 2014-10-16 DIAGNOSIS — I429 Cardiomyopathy, unspecified: Secondary | ICD-10-CM | POA: Diagnosis not present

## 2014-10-16 DIAGNOSIS — R931 Abnormal findings on diagnostic imaging of heart and coronary circulation: Secondary | ICD-10-CM | POA: Diagnosis not present

## 2014-10-16 DIAGNOSIS — R1013 Epigastric pain: Secondary | ICD-10-CM | POA: Diagnosis not present

## 2014-10-16 DIAGNOSIS — R05 Cough: Secondary | ICD-10-CM | POA: Diagnosis not present

## 2014-10-16 DIAGNOSIS — I256 Silent myocardial ischemia: Secondary | ICD-10-CM | POA: Diagnosis not present

## 2014-10-16 DIAGNOSIS — J9601 Acute respiratory failure with hypoxia: Secondary | ICD-10-CM | POA: Diagnosis not present

## 2014-10-16 DIAGNOSIS — Z4682 Encounter for fitting and adjustment of non-vascular catheter: Secondary | ICD-10-CM | POA: Diagnosis not present

## 2014-10-16 DIAGNOSIS — D72829 Elevated white blood cell count, unspecified: Secondary | ICD-10-CM

## 2014-10-16 DIAGNOSIS — M1990 Unspecified osteoarthritis, unspecified site: Secondary | ICD-10-CM | POA: Diagnosis present

## 2014-10-16 DIAGNOSIS — R778 Other specified abnormalities of plasma proteins: Secondary | ICD-10-CM | POA: Diagnosis not present

## 2014-10-16 DIAGNOSIS — I5181 Takotsubo syndrome: Secondary | ICD-10-CM | POA: Diagnosis not present

## 2014-10-16 DIAGNOSIS — M545 Low back pain: Secondary | ICD-10-CM | POA: Diagnosis not present

## 2014-10-16 DIAGNOSIS — Z789 Other specified health status: Secondary | ICD-10-CM | POA: Diagnosis not present

## 2014-10-16 DIAGNOSIS — J9690 Respiratory failure, unspecified, unspecified whether with hypoxia or hypercapnia: Secondary | ICD-10-CM | POA: Diagnosis not present

## 2014-10-16 DIAGNOSIS — R071 Chest pain on breathing: Secondary | ICD-10-CM | POA: Diagnosis not present

## 2014-10-16 DIAGNOSIS — Z978 Presence of other specified devices: Secondary | ICD-10-CM | POA: Insufficient documentation

## 2014-10-16 HISTORY — PX: LAPAROTOMY: SHX154

## 2014-10-16 LAB — I-STAT TROPONIN, ED: TROPONIN I, POC: 0.01 ng/mL (ref 0.00–0.08)

## 2014-10-16 LAB — COMPREHENSIVE METABOLIC PANEL
ALT: 33 U/L (ref 14–54)
AST: 22 U/L (ref 15–41)
Albumin: 3.6 g/dL (ref 3.5–5.0)
Alkaline Phosphatase: 71 U/L (ref 38–126)
Anion gap: 14 (ref 5–15)
BUN: 32 mg/dL — ABNORMAL HIGH (ref 6–20)
CO2: 22 mmol/L (ref 22–32)
Calcium: 9 mg/dL (ref 8.9–10.3)
Chloride: 101 mmol/L (ref 101–111)
Creatinine, Ser: 1.16 mg/dL — ABNORMAL HIGH (ref 0.44–1.00)
GFR calc Af Amer: 54 mL/min — ABNORMAL LOW (ref >60)
GFR calc non Af Amer: 47 mL/min — ABNORMAL LOW (ref >60)
Glucose, Bld: 148 mg/dL — ABNORMAL HIGH (ref 65–99)
Potassium: 3.5 mmol/L (ref 3.5–5.1)
Sodium: 137 mmol/L (ref 135–145)
Total Bilirubin: 0.7 mg/dL (ref 0.3–1.2)
Total Protein: 7 g/dL (ref 6.5–8.1)

## 2014-10-16 LAB — LIPASE, BLOOD: Lipase: 31 U/L (ref 22–51)

## 2014-10-16 LAB — CBC WITH DIFFERENTIAL/PLATELET
Basophils Absolute: 0 10*3/uL (ref 0.0–0.1)
Basophils Relative: 0 %
Eosinophils Absolute: 0.1 10*3/uL (ref 0.0–0.7)
Eosinophils Relative: 0 %
HCT: 44 % (ref 36.0–46.0)
Hemoglobin: 14.5 g/dL (ref 12.0–15.0)
Lymphocytes Relative: 11 %
Lymphs Abs: 2.6 10*3/uL (ref 0.7–4.0)
MCH: 30.1 pg (ref 26.0–34.0)
MCHC: 33 g/dL (ref 30.0–36.0)
MCV: 91.5 fL (ref 78.0–100.0)
Monocytes Absolute: 0.9 10*3/uL (ref 0.1–1.0)
Monocytes Relative: 4 %
Neutro Abs: 21.3 10*3/uL — ABNORMAL HIGH (ref 1.7–7.7)
Neutrophils Relative %: 85 %
Platelets: 378 10*3/uL (ref 150–400)
RBC: 4.81 MIL/uL (ref 3.87–5.11)
RDW: 15.4 % (ref 11.5–15.5)
WBC: 24.9 10*3/uL — ABNORMAL HIGH (ref 4.0–10.5)

## 2014-10-16 LAB — I-STAT CREATININE, ED: Creatinine, Ser: 1.1 mg/dL — ABNORMAL HIGH (ref 0.44–1.00)

## 2014-10-16 LAB — I-STAT CG4 LACTIC ACID, ED: Lactic Acid, Venous: 1.59 mmol/L (ref 0.5–2.0)

## 2014-10-16 SURGERY — LAPAROTOMY, EXPLORATORY
Anesthesia: General | Site: Abdomen

## 2014-10-16 MED ORDER — FENTANYL CITRATE (PF) 250 MCG/5ML IJ SOLN
INTRAMUSCULAR | Status: AC
  Filled 2014-10-16: qty 5

## 2014-10-16 MED ORDER — GLYCOPYRROLATE 0.2 MG/ML IJ SOLN
INTRAMUSCULAR | Status: AC
  Filled 2014-10-16: qty 2

## 2014-10-16 MED ORDER — PHENYLEPHRINE 40 MCG/ML (10ML) SYRINGE FOR IV PUSH (FOR BLOOD PRESSURE SUPPORT)
PREFILLED_SYRINGE | INTRAVENOUS | Status: AC
  Filled 2014-10-16: qty 10

## 2014-10-16 MED ORDER — GLYCOPYRROLATE 0.2 MG/ML IJ SOLN
INTRAMUSCULAR | Status: DC | PRN
  Administered 2014-10-16: 0.4 mg via INTRAVENOUS

## 2014-10-16 MED ORDER — DEXAMETHASONE SODIUM PHOSPHATE 10 MG/ML IJ SOLN
INTRAMUSCULAR | Status: AC
  Filled 2014-10-16: qty 1

## 2014-10-16 MED ORDER — PROPOFOL 10 MG/ML IV BOLUS
INTRAVENOUS | Status: AC
  Filled 2014-10-16: qty 20

## 2014-10-16 MED ORDER — SODIUM CHLORIDE 0.9 % IV BOLUS (SEPSIS)
1000.0000 mL | Freq: Once | INTRAVENOUS | Status: AC
  Administered 2014-10-16: 1000 mL via INTRAVENOUS

## 2014-10-16 MED ORDER — SODIUM CHLORIDE 0.9 % IV SOLN
INTRAVENOUS | Status: DC | PRN
  Administered 2014-10-16: 23:00:00 via INTRAVENOUS

## 2014-10-16 MED ORDER — ROCURONIUM BROMIDE 100 MG/10ML IV SOLN
INTRAVENOUS | Status: DC | PRN
  Administered 2014-10-16: 10 mg via INTRAVENOUS
  Administered 2014-10-16: 30 mg via INTRAVENOUS

## 2014-10-16 MED ORDER — LABETALOL HCL 5 MG/ML IV SOLN
10.0000 mg | Freq: Once | INTRAVENOUS | Status: AC
  Administered 2014-10-16: 10 mg via INTRAVENOUS
  Filled 2014-10-16: qty 4

## 2014-10-16 MED ORDER — DEXAMETHASONE SODIUM PHOSPHATE 10 MG/ML IJ SOLN
INTRAMUSCULAR | Status: DC | PRN
  Administered 2014-10-16: 10 mg via INTRAVENOUS

## 2014-10-16 MED ORDER — ONDANSETRON HCL 4 MG/2ML IJ SOLN
INTRAMUSCULAR | Status: DC | PRN
  Administered 2014-10-16: 4 mg via INTRAVENOUS

## 2014-10-16 MED ORDER — SUCCINYLCHOLINE CHLORIDE 20 MG/ML IJ SOLN
INTRAMUSCULAR | Status: AC
  Filled 2014-10-16: qty 1

## 2014-10-16 MED ORDER — NEOSTIGMINE METHYLSULFATE 10 MG/10ML IV SOLN
INTRAVENOUS | Status: DC | PRN
  Administered 2014-10-16: 3 mg via INTRAVENOUS

## 2014-10-16 MED ORDER — FENTANYL CITRATE (PF) 100 MCG/2ML IJ SOLN
INTRAMUSCULAR | Status: DC | PRN
Start: 2014-10-16 — End: 2014-10-17
  Administered 2014-10-16: 100 ug via INTRAVENOUS
  Administered 2014-10-16: 50 ug via INTRAVENOUS
  Administered 2014-10-16 – 2014-10-17 (×3): 100 ug via INTRAVENOUS
  Administered 2014-10-17: 50 ug via INTRAVENOUS

## 2014-10-16 MED ORDER — MIDAZOLAM HCL 5 MG/5ML IJ SOLN
INTRAMUSCULAR | Status: DC | PRN
  Administered 2014-10-16: 2 mg via INTRAVENOUS

## 2014-10-16 MED ORDER — 0.9 % SODIUM CHLORIDE (POUR BTL) OPTIME
TOPICAL | Status: DC | PRN
  Administered 2014-10-16: 5000 mL

## 2014-10-16 MED ORDER — HYDROMORPHONE HCL 1 MG/ML IJ SOLN
INTRAMUSCULAR | Status: AC
  Administered 2014-10-16: 1 mg
  Filled 2014-10-16: qty 1

## 2014-10-16 MED ORDER — PROPOFOL 10 MG/ML IV BOLUS
INTRAVENOUS | Status: DC | PRN
  Administered 2014-10-16: 150 mg via INTRAVENOUS

## 2014-10-16 MED ORDER — ROCURONIUM BROMIDE 50 MG/5ML IV SOLN
INTRAVENOUS | Status: AC
  Filled 2014-10-16: qty 1

## 2014-10-16 MED ORDER — PIPERACILLIN-TAZOBACTAM 4.5 G IVPB: 4.5000 g | Freq: Once | INTRAVENOUS | Status: DC

## 2014-10-16 MED ORDER — NEOSTIGMINE METHYLSULFATE 10 MG/10ML IV SOLN
INTRAVENOUS | Status: AC
  Filled 2014-10-16: qty 1

## 2014-10-16 MED ORDER — LIDOCAINE HCL (CARDIAC) 20 MG/ML IV SOLN
INTRAVENOUS | Status: DC | PRN
  Administered 2014-10-16: 100 mg via INTRAVENOUS

## 2014-10-16 MED ORDER — LACTATED RINGERS IV SOLN
INTRAVENOUS | Status: DC | PRN
  Administered 2014-10-16: 22:00:00 via INTRAVENOUS

## 2014-10-16 MED ORDER — MIDAZOLAM HCL 2 MG/2ML IJ SOLN
INTRAMUSCULAR | Status: AC
  Filled 2014-10-16: qty 4

## 2014-10-16 MED ORDER — PIPERACILLIN-TAZOBACTAM 3.375 G IVPB 30 MIN
3.3750 g | Freq: Once | INTRAVENOUS | Status: AC
  Administered 2014-10-16: 3.375 g via INTRAVENOUS
  Filled 2014-10-16: qty 50

## 2014-10-16 MED ORDER — SUCCINYLCHOLINE CHLORIDE 20 MG/ML IJ SOLN
INTRAMUSCULAR | Status: DC | PRN
  Administered 2014-10-16: 120 mg via INTRAVENOUS

## 2014-10-16 MED ORDER — HYDROMORPHONE HCL 1 MG/ML IJ SOLN
1.0000 mg | Freq: Once | INTRAMUSCULAR | Status: AC
  Administered 2014-10-16: 1 mg via INTRAVENOUS
  Filled 2014-10-16: qty 1

## 2014-10-16 MED ORDER — ONDANSETRON HCL 4 MG/2ML IJ SOLN
INTRAMUSCULAR | Status: AC
  Filled 2014-10-16: qty 2

## 2014-10-16 MED ORDER — PHENYLEPHRINE HCL 10 MG/ML IJ SOLN
INTRAMUSCULAR | Status: DC | PRN
  Administered 2014-10-16: 120 ug via INTRAVENOUS
  Administered 2014-10-16 (×2): 80 ug via INTRAVENOUS

## 2014-10-16 MED ORDER — LIDOCAINE HCL (CARDIAC) 20 MG/ML IV SOLN
INTRAVENOUS | Status: AC
  Filled 2014-10-16: qty 5

## 2014-10-16 SURGICAL SUPPLY — 46 items
BLADE SURG ROTATE 9660 (MISCELLANEOUS) IMPLANT
CANISTER SUCTION 2500CC (MISCELLANEOUS) ×3 IMPLANT
CHLORAPREP W/TINT 26ML (MISCELLANEOUS) ×3 IMPLANT
COVER MAYO STAND STRL (DRAPES) IMPLANT
COVER SURGICAL LIGHT HANDLE (MISCELLANEOUS) ×3 IMPLANT
DRAPE LAPAROSCOPIC ABDOMINAL (DRAPES) ×3 IMPLANT
DRAPE PROXIMA HALF (DRAPES) IMPLANT
DRAPE UTILITY XL STRL (DRAPES) ×6 IMPLANT
DRAPE WARM FLUID 44X44 (DRAPE) ×3 IMPLANT
DRSG OPSITE POSTOP 4X10 (GAUZE/BANDAGES/DRESSINGS) IMPLANT
DRSG OPSITE POSTOP 4X8 (GAUZE/BANDAGES/DRESSINGS) IMPLANT
ELECT BLADE 6.5 EXT (BLADE) IMPLANT
ELECT CAUTERY BLADE 6.4 (BLADE) ×6 IMPLANT
ELECT REM PT RETURN 9FT ADLT (ELECTROSURGICAL) ×3
ELECTRODE REM PT RTRN 9FT ADLT (ELECTROSURGICAL) ×1 IMPLANT
GLOVE BIO SURGEON STRL SZ8 (GLOVE) ×3 IMPLANT
GLOVE BIOGEL PI IND STRL 8 (GLOVE) ×1 IMPLANT
GLOVE BIOGEL PI INDICATOR 8 (GLOVE) ×2
GOWN STRL REUS W/ TWL LRG LVL3 (GOWN DISPOSABLE) ×2 IMPLANT
GOWN STRL REUS W/ TWL XL LVL3 (GOWN DISPOSABLE) ×1 IMPLANT
GOWN STRL REUS W/TWL LRG LVL3 (GOWN DISPOSABLE) ×6
GOWN STRL REUS W/TWL XL LVL3 (GOWN DISPOSABLE) ×3
KIT BASIN OR (CUSTOM PROCEDURE TRAY) ×3 IMPLANT
KIT ROOM TURNOVER OR (KITS) ×3 IMPLANT
LIGASURE IMPACT 36 18CM CVD LR (INSTRUMENTS) IMPLANT
NS IRRIG 1000ML POUR BTL (IV SOLUTION) ×6 IMPLANT
PACK GENERAL/GYN (CUSTOM PROCEDURE TRAY) ×3 IMPLANT
PAD ABD 8X10 STRL (GAUZE/BANDAGES/DRESSINGS) ×2 IMPLANT
PAD ARMBOARD 7.5X6 YLW CONV (MISCELLANEOUS) ×3 IMPLANT
PENCIL BUTTON HOLSTER BLD 10FT (ELECTRODE) IMPLANT
SPECIMEN JAR LARGE (MISCELLANEOUS) IMPLANT
SPONGE GAUZE 4X4 12PLY STER LF (GAUZE/BANDAGES/DRESSINGS) ×2 IMPLANT
SPONGE LAP 18X18 X RAY DECT (DISPOSABLE) IMPLANT
STAPLER VISISTAT 35W (STAPLE) ×3 IMPLANT
SUCTION POOLE TIP (SUCTIONS) ×3 IMPLANT
SUT PDS AB 1 TP1 96 (SUTURE) ×6 IMPLANT
SUT SILK 2 0 SH CR/8 (SUTURE) ×3 IMPLANT
SUT SILK 2 0 TIES 10X30 (SUTURE) ×3 IMPLANT
SUT SILK 3 0 SH CR/8 (SUTURE) ×3 IMPLANT
SUT SILK 3 0 TIES 10X30 (SUTURE) ×3 IMPLANT
TAPE CLOTH SURG 6X10 WHT LF (GAUZE/BANDAGES/DRESSINGS) ×2 IMPLANT
TOWEL OR 17X26 10 PK STRL BLUE (TOWEL DISPOSABLE) ×3 IMPLANT
TRAY FOLEY CATH 16FRSI W/METER (SET/KITS/TRAYS/PACK) IMPLANT
TUBE CONNECTING 12'X1/4 (SUCTIONS)
TUBE CONNECTING 12X1/4 (SUCTIONS) IMPLANT
YANKAUER SUCT BULB TIP NO VENT (SUCTIONS) IMPLANT

## 2014-10-16 NOTE — Anesthesia Procedure Notes (Signed)
Procedure Name: Intubation Date/Time: 10/16/2014 10:25 PM Performed by: Manuela Schwartz B Pre-anesthesia Checklist: Patient identified, Emergency Drugs available, Suction available, Patient being monitored and Timeout performed Patient Re-evaluated:Patient Re-evaluated prior to inductionOxygen Delivery Method: Circle system utilized Preoxygenation: Pre-oxygenation with 100% oxygen Intubation Type: IV induction and Rapid sequence Laryngoscope Size: Mac and 3 Grade View: Grade I Tube type: Subglottic suction tube Tube size: 7.0 mm Number of attempts: 1 Airway Equipment and Method: Stylet Placement Confirmation: ETT inserted through vocal cords under direct vision,  positive ETCO2 and breath sounds checked- equal and bilateral Secured at: 20 cm Tube secured with: Tape Dental Injury: Teeth and Oropharynx as per pre-operative assessment

## 2014-10-16 NOTE — ED Provider Notes (Signed)
CSN: [id number]     Arrival date & time 10/16/14  1830 History   First MD Initiated Contact with Patient 10/16/14 1843     Chief Complaint  Patient presents with  . Chest Pain     (Consider location/radiation/quality/duration/timing/severity/associated sxs/prior Treatment) HPI Patient presents to the emergency department with sudden onset of upper abdominal pain that radiates to both shoulder blades.  The patient states that this is severe abdominal pain she has never had any issue similar to this in the past.  Patient states that she has some pain that radiates into her chest as well.  It feels like it is constant shortness of breath.  The patient states that she did not take any medications prior to arrival.  EMS did give the patient nitroglycerin and aspirin.  Patient states that did not help with her symptoms.  Patient denies nausea, vomiting, diarrhea, bloody stool, hematemesis, dysuria, incontinence, fever, headache, blurred vision, lightheadedness, near syncope or syncope.  The patient states that she has had some abdominal surgeries for hernia repair in the past Past Medical History  Diagnosis Date  . Insomnia     takes Ambien nightly  . Obesity   . Menopausal syndrome   . Tobacco abuse   . Low back pain   . Bronchitis     hx of-many yrs ago  . Abnormal finding on Pap smear     hx abnl pap  . Hypertension     takes Diltiazem and HCTZ daily  . Constipation     takes Miralax daily  . Arthritis   . Joint pain   . Joint swelling    Past Surgical History  Procedure Laterality Date  . Tonsillectomy    . Dilation and curettage of uterus    . Ventral hernia repair  01/24/2011    Procedure: LAPAROSCOPIC VENTRAL HERNIA;  Surgeon: Adin Hector, MD;  Location: [facility];  Service: General;  Laterality: N/A;  with Mesh  . Maximum access (mas)posterior lumbar interbody fusion (plif) 1 level N/A 11/04/2012    Procedure: Lumbar four-five Maximum access posterior lumbar interbody fusion  with Nuvasive;  Surgeon: Eustace Moore, MD;  Location: [facility];  Service: Neurosurgery;  Laterality: N/A;  Lumbar four-five Maximum access posterior lumbar interbody fusion with Nuvasive   Family History  Problem Relation Age of Onset  . Cancer Brother     renal ,also sister  . COPD Brother   . Hypertension Brother   . Diabetes Brother   . Kidney disease Brother   . Diabetes Brother     and sister  . Breast cancer      sister  . Kidney disease    . Cancer Mother     Breast Cancer  . Diabetes Mother   . Hypertension Mother   . Glaucoma Mother   . Diabetes Sister   . Hypertension Sister   . Heart disease Sister    Social History  Substance Use Topics  . Smoking status: Current Every Day Smoker -- 0.25 packs/day for 25 years    Types: Cigarettes  . Smokeless tobacco: Never Used     Comment: Electronic Cigarettes  . Alcohol Use: No     Comment: occassional   OB History    No data available     Review of Systems  All other systems negative except as documented in the HPI. All pertinent positives and negatives as reviewed in the HPI.  Allergies  K-dur  Home Medications   Prior  to Admission medications   Medication Sig Start Date End Date Taking? Authorizing Provider  aspirin 81 MG tablet Take 81 mg by mouth every morning.    Yes Historical Provider, MD  diltiazem (TIAZAC) 360 MG 24 hr capsule Take 1 capsule (360 mg total) by mouth daily. 06/01/14  Yes Marletta Lor, MD  furosemide (LASIX) 20 MG tablet Take 20 mg by mouth daily as needed for fluid or edema.   Yes Historical Provider, MD  hydrochlorothiazide (HYDRODIURIL) 25 MG tablet Take 1 tablet (25 mg total) by mouth daily. 11/29/13  Yes Marletta Lor, MD  HYDROcodone-acetaminophen [facility]) 10-325 MG per tablet Take 1 tablet by mouth every 6 (six) hours as needed (Prescribed by Dr. Maryjean Ka).  05/18/14  Yes Historical Provider, MD  meloxicam (MOBIC) 7.5 MG tablet Take 7.5 mg by mouth 2 (two) times daily.  Prescribed by Dr. Maryjean Ka   Yes Historical Provider, MD  polyethylene glycol powder (GLYCOLAX/MIRALAX) powder Take 17 g by mouth daily as needed for moderate constipation.    Yes Historical Provider, MD  tiZANidine (ZANAFLEX) 4 MG tablet Take 4 mg by mouth every 8 (eight) hours as needed for muscle spasms.   Yes Historical Provider, MD  VOLTAREN 1 % GEL Apply 1 application topically daily as needed. FOR HANDS 09/17/14  Yes Historical Provider, MD  zolpidem (AMBIEN) 10 MG tablet TAKE 1 TABLET BY MOUTH AT BEDTIME AS NEEDED FOR SLEEP Patient taking differently: TAKE 1 TABLET BY MOUTH AT BEDTIME FOR SLEEP 08/15/14  Yes Marletta Lor, MD  nortriptyline (PAMELOR) 25 MG capsule Take 1-2 capsules (25-50 mg total) by mouth at bedtime. 02/17/14   Marletta Lor, MD   BP 193/100 mmHg  Pulse 84  Temp(Src) 97.6 F (36.4 C) (Oral)  Resp 19  Ht 4\' 8"  (1.422 m)  Wt 165 lb (74.844 kg)  BMI 37.01 kg/m2  SpO2 97% Physical Exam  Constitutional: She is oriented to person, place, and time. She appears well-developed and well-nourished. She appears distressed.  HENT:  Head: Normocephalic and atraumatic.  Mouth/Throat: Oropharynx is clear and moist.  Eyes: Pupils are equal, round, and reactive to light.  Neck: Normal range of motion. Neck supple.  Cardiovascular: Normal rate, regular rhythm and normal heart sounds.  Exam reveals no gallop and no friction rub.   No murmur heard. Pulmonary/Chest: Effort normal and breath sounds normal. No respiratory distress.  Abdominal: Soft. Normal appearance and bowel sounds are normal. She exhibits distension. There is tenderness. There is rigidity and guarding. No hernia.  Neurological: She is alert and oriented to person, place, and time. She exhibits normal muscle tone. Coordination normal.  Skin: Skin is warm and dry. No rash noted. No erythema.  Psychiatric: She has a normal mood and affect. Her behavior is normal.  Nursing note and vitals reviewed.   ED  Course  Procedures (including critical care time) Labs Review Labs Reviewed  CBC WITH DIFFERENTIAL/PLATELET - Abnormal; Notable for the following:    WBC 24.9 (*)    Neutro Abs 21.3 (*)    All other components within normal limits  COMPREHENSIVE METABOLIC PANEL - Abnormal; Notable for the following:    Glucose, Bld 148 (*)    BUN 32 (*)    Creatinine, Ser 1.16 (*)    GFR calc non Af Amer 47 (*)    GFR calc Af Amer 54 (*)    All other components within normal limits  I-STAT CREATININE, ED - Abnormal; Notable for the following:  Creatinine, Ser 1.10 (*)    All other components within normal limits  LIPASE, BLOOD  I-STAT CG4 LACTIC ACID, ED  Randolm Idol, ED    Imaging Review Dg Chest 2 View  10/16/2014   CLINICAL DATA:  Centralized chest pain X 1 DAY, chest pressure X 1 DAY, cough X 1 day, hx of htn  EXAM: CHEST  2 VIEW  COMPARISON:  10/27/2012  FINDINGS: Lung volumes are relatively low. There is bibasilar atelectasis with some atelectasis along the minor fissure. No convincing pneumonia and no pulmonary edema.  No pleural effusion or pneumothorax.  Cardiac silhouette is borderline enlarged. No mediastinal or hilar masses or convincing adenopathy.  Bony thorax is demineralized.  IMPRESSION: 1. Low lung volumes and lung base atelectasis and atelectasis along the minor fissure. No convincing pneumonia. No pulmonary edema.   Electronically Signed   By: Lajean Manes M.D.   On: 10/16/2014 19:44   I have personally reviewed and evaluated these images and lab results as part of my medical decision-making.  CRITICAL CARE Performed by: [facility] Total critical care time: 40 minutes Critical care time was exclusive of separately billable procedures and treating other patients. Critical care was necessary to treat or prevent imminent or life-threatening deterioration. Critical care was time spent personally by me on the following activities: development of treatment plan with  patient and/or surrogate as well as nursing, discussions with consultants, evaluation of patient's response to treatment, examination of patient, obtaining history from patient or surrogate, ordering and performing treatments and interventions, ordering and review of laboratory studies, ordering and review of radiographic studies, pulse oximetry and re-evaluation of patient's condition.  Patient was noted to have rigidity on exam and severe pain.  She states she cannot lay back due to the increasing pain.  I immediately went over to CT scan and informed the tech that she will need be the next patient to be scanned.  I was concerned about a significant intra-abdominal process such as aortic dissection or other severe intra-abdominal process.  I spoke with Georganna Skeans, M.D., from surgery, who will be in to evaluate the patient and also started on Zosyn 4.5 g along with IV fluids.  Patient and family are advised of the findings and the plan   Dalia Heading, PA-C 10/16/14 2113  Charlesetta Shanks, MD 10/16/14 [phone]

## 2014-10-16 NOTE — Op Note (Signed)
10/16/2014  11:37 PM  PATIENT:  Sally Douglas  69 y.o. female  PRE-OPERATIVE DIAGNOSIS:  Perforated gastric ulcer  POST-OPERATIVE DIAGNOSIS:  Perforated gastric ulcer  PROCEDURE:  Procedure(s): EXPLORATORY LAPAROTOMY, GASTRIC BIOPSY, REPAIR OF PERFORATED GASTRIC ULCER  SURGEON:  Surgeon(s): Georganna Skeans, MD  ASSISTANTS: none   ANESTHESIA:   general  EBL:  Total I/O In: 1000 [I.V.:1000] Out: 200 [Urine:200]  BLOOD ADMINISTERED:none  DRAINS: (1) Jackson-Pratt drain(s) with closed bulb suction in the RUQ   SPECIMEN:  Biopsy / Limited Resection  DISPOSITION OF SPECIMEN:  PATHOLOGY  COUNTS:  YES  DICTATION: .Dragon Dictation Findings: Perforated gastric ulcer  Procedure in detail: [name] is brought for emergent exploratory laparotomy for suspected perforated gastric ulcer. Informed consent was obtained. She received intravenous antibiotics. She was brought to the operating room and general endotracheal anesthesia was administered by the anesthesia staff. Abdomen was prepped and draped in sterile fashion after nursing staff placed a Foley catheter. We did a time out procedure. Upper midline incision was made. Subcutaneous tissues were dissected down revealing the anterior fascia. This was divided sharply and the peritoneal cavity was entered under direct vision. there was copious tan purulent fluid in the abdomen which was sent for cultures. the fascia was gradually opened encountering an incorporated mesh from her previous hernia repair. There were a lot of omental adhesions up to this mesh which were gradually taken down. Hemostasis was obtained with cautery and ties. Once we freed up the omentum, the midline was opened down towards the umbilicus. There was a small herniation of preperitoneal fat which was excised. Next, the abdomen was copiously irrigated with multiple liters of warm saline. The perforation on the anterior stomach was identified. A small biopsy was taken sharply.  This was sent to pathology. The ulcer was less than 1 cm in size. It was closed with multiple 2-0 silk sutures and the tails were left long. A tongue of omentum was brought up and that suture tails were used to tied down securely over this area. This achieved good closure. Hemostasis was ensured in the abdomen. Nasogastric tube was positioned in the stomach appropriately. [place] drain was placed via the right upper quadrant. It was secured to the skin with nylon. Residual peritoneal fluid was evacuated. Fascia was closed with running #1 looped PDS from each end and tied in the middle. The midline wound was irrigated and hemostasis was ensured. It was packed with a sterile wet-to-dry dressing. All counts were correct. She tolerated the procedure well. She was taken to recovery with plans for admission to the ICU postoperatively. PATIENT DISPOSITION:  ICU - extubated and stable.   Delay start of Pharmacological VTE agent (>24hrs) due to surgical blood loss or risk of bleeding:  no  Georganna Skeans, MD, MPH, FACS Pager: [phone]  9/18/201611:37 PM

## 2014-10-16 NOTE — Anesthesia Preprocedure Evaluation (Addendum)
Anesthesia Evaluation  Patient identified by MRN, date of birth, ID band Patient awake    Reviewed: Allergy & Precautions, NPO status , Patient's Chart, lab work & pertinent test results  History of Anesthesia Complications Negative for: history of anesthetic complications  Airway Mallampati: III  TM Distance: >3 FB Neck ROM: Full    Dental  (+) Dental Advisory Given, Upper Dentures   Pulmonary neg pulmonary ROS, Current Smoker,     + wheezing      Cardiovascular Exercise Tolerance: Good hypertension, Pt. on medications (-) angina(-) Past MI Normal cardiovascular exam Rhythm:Regular Rate:Normal     Neuro/Psych negative neurological ROS  negative psych ROS   GI/Hepatic Neg liver ROS, PUD, Perforated bowel    Endo/Other  Obesity   Renal/GU negative Renal ROS     Musculoskeletal  (+) Arthritis , Osteoarthritis,    Abdominal   Peds  Hematology negative hematology ROS (+)   Anesthesia Other Findings Day of surgery medications reviewed with the patient.  Reproductive/Obstetrics                          Anesthesia Physical Anesthesia Plan  ASA: II and emergent  Anesthesia Plan: General   Post-op Pain Management:    Induction: Intravenous and Rapid sequence  Airway Management Planned: Oral ETT  Additional Equipment: Arterial line  Intra-op Plan:   Post-operative Plan: Extubation in OR and Possible Post-op intubation/ventilation  Informed Consent: I have reviewed the patients History and Physical, chart, labs and discussed the procedure including the risks, benefits and alternatives for the proposed anesthesia with the patient or authorized representative who has indicated his/her understanding and acceptance.   Dental advisory given  Plan Discussed with: CRNA  Anesthesia Plan Comments: (Risks/benefits of general anesthesia discussed with patient including risk of damage to teeth,  lips, gum, and tongue, nausea/vomiting, allergic reactions to medications, and the possibility of heart attack, stroke and death.  All patient questions answered.  Patient wishes to proceed.)       Anesthesia Quick Evaluation

## 2014-10-16 NOTE — H&P (Signed)
Sally Douglas is an 69 y.o. female.   Chief Complaint: Upper abdominal pain HPI: Sally Douglas developed sudden onset upper abdominal pain radiating into both shoulders earlier today. She came to the emergency room for evaluation. Workup here demonstrates leukocytosis of 24,900. CT angiogram of the chest/abdomen/pelvis was performed revealing free peritoneal air and fluid consistent with perforated gastric ulcer. I was asked to see her for treatment of this. She is known to our practice status post laparoscopic hernia repair in 2012 by Dr. Johney Maine.  Past Medical History  Diagnosis Date  . Insomnia     takes Ambien nightly  . Obesity   . Menopausal syndrome   . Tobacco abuse   . Low back pain   . Bronchitis     hx of-many yrs ago  . Abnormal finding on Pap smear     hx abnl pap  . Hypertension     takes Diltiazem and HCTZ daily  . Constipation     takes Miralax daily  . Arthritis   . Joint pain   . Joint swelling     Past Surgical History  Procedure Laterality Date  . Tonsillectomy    . Dilation and curettage of uterus    . Ventral hernia repair  01/24/2011    Procedure: LAPAROSCOPIC VENTRAL HERNIA;  Surgeon: Adin Hector, MD;  Location: [facility];  Service: General;  Laterality: N/A;  with Mesh  . Maximum access (mas)posterior lumbar interbody fusion (plif) 1 level N/A 11/04/2012    Procedure: Lumbar four-five Maximum access posterior lumbar interbody fusion with Nuvasive;  Surgeon: Eustace Moore, MD;  Location: [facility];  Service: Neurosurgery;  Laterality: N/A;  Lumbar four-five Maximum access posterior lumbar interbody fusion with Nuvasive    Family History  Problem Relation Age of Onset  . Cancer Brother     renal ,also sister  . COPD Brother   . Hypertension Brother   . Diabetes Brother   . Kidney disease Brother   . Diabetes Brother     and sister  . Breast cancer      sister  . Kidney disease    . Cancer Mother     Breast Cancer  . Diabetes Mother   . Hypertension  Mother   . Glaucoma Mother   . Diabetes Sister   . Hypertension Sister   . Heart disease Sister    Social History:  reports that she has been smoking Cigarettes.  She has a 6.25 pack-year smoking history. She has never used smokeless tobacco. She reports that she does not drink alcohol or use illicit drugs.  Allergies:  Allergies  Allergen Reactions  . K-Dur [Potassium Chloride] Swelling    Face Swelling     (Not in a hospital admission)  Results for orders placed or performed during the hospital encounter of 10/16/14 (from the past 48 hour(s))  CBC with Differential/Platelet     Status: Abnormal   Collection Time: 10/16/14  8:13 PM  Result Value Ref Range   WBC 24.9 (H) 4.0 - 10.5 K/uL   RBC 4.81 3.87 - 5.11 MIL/uL   Hemoglobin 14.5 12.0 - 15.0 g/dL   HCT 44.0 36.0 - 46.0 %   MCV 91.5 78.0 - 100.0 fL   MCH 30.1 26.0 - 34.0 pg   MCHC 33.0 30.0 - 36.0 g/dL   RDW 15.4 11.5 - 15.5 %   Platelets 378 150 - 400 K/uL   Neutrophils Relative % 85 %   Neutro Abs 21.3 (H)  1.7 - 7.7 K/uL   Lymphocytes Relative 11 %   Lymphs Abs 2.6 0.7 - 4.0 K/uL   Monocytes Relative 4 %   Monocytes Absolute 0.9 0.1 - 1.0 K/uL   Eosinophils Relative 0 %   Eosinophils Absolute 0.1 0.0 - 0.7 K/uL   Basophils Relative 0 %   Basophils Absolute 0.0 0.0 - 0.1 K/uL  Comprehensive metabolic panel     Status: Abnormal   Collection Time: 10/16/14  8:13 PM  Result Value Ref Range   Sodium 137 135 - 145 mmol/L   Potassium 3.5 3.5 - 5.1 mmol/L   Chloride 101 101 - 111 mmol/L   CO2 22 22 - 32 mmol/L   Glucose, Bld 148 (H) 65 - 99 mg/dL   BUN 32 (H) 6 - 20 mg/dL   Creatinine, Ser 1.16 (H) 0.44 - 1.00 mg/dL   Calcium 9.0 8.9 - 10.3 mg/dL   Total Protein 7.0 6.5 - 8.1 g/dL   Albumin 3.6 3.5 - 5.0 g/dL   AST 22 15 - 41 U/L   ALT 33 14 - 54 U/L   Alkaline Phosphatase 71 38 - 126 U/L   Total Bilirubin 0.7 0.3 - 1.2 mg/dL   GFR calc non Af Amer 47 (L) >60 mL/min   GFR calc Af Amer 54 (L) >60 mL/min     Comment: (NOTE) The eGFR has been calculated using the CKD EPI equation. This calculation has not been validated in all clinical situations. eGFR's persistently <60 mL/min signify possible Chronic Kidney Disease.    Anion gap 14 5 - 15  Lipase, blood     Status: None   Collection Time: 10/16/14  8:13 PM  Result Value Ref Range   Lipase 31 22 - 51 U/L  I-stat troponin, ED     Status: None   Collection Time: 10/16/14  8:17 PM  Result Value Ref Range   Troponin i, poc 0.01 0.00 - 0.08 ng/mL   Comment 3            Comment: Due to the release kinetics of cTnI, a negative result within the first hours of the onset of symptoms does not rule out myocardial infarction with certainty. If myocardial infarction is still suspected, repeat the test at appropriate intervals.   I-stat Creatinine, ED     Status: Abnormal   Collection Time: 10/16/14  8:19 PM  Result Value Ref Range   Creatinine, Ser 1.10 (H) 0.44 - 1.00 mg/dL  I-Stat CG4 Lactic Acid, ED     Status: None   Collection Time: 10/16/14  8:20 PM  Result Value Ref Range   Lactic Acid, Venous 1.59 0.5 - 2.0 mmol/L   Dg Chest 2 View  10/16/2014   CLINICAL DATA:  Centralized chest pain X 1 DAY, chest pressure X 1 DAY, cough X 1 day, hx of htn  EXAM: CHEST  2 VIEW  COMPARISON:  10/27/2012  FINDINGS: Lung volumes are relatively low. There is bibasilar atelectasis with some atelectasis along the minor fissure. No convincing pneumonia and no pulmonary edema.  No pleural effusion or pneumothorax.  Cardiac silhouette is borderline enlarged. No mediastinal or hilar masses or convincing adenopathy.  Bony thorax is demineralized.  IMPRESSION: 1. Low lung volumes and lung base atelectasis and atelectasis along the minor fissure. No convincing pneumonia. No pulmonary edema.   Electronically Signed   By: Lajean Manes M.D.   On: 10/16/2014 19:44   Ct Angio Chest Aorta W/cm &/or Wo/cm  10/16/2014  CLINICAL DATA:  Severe abdominal and chest pain  EXAM:  CT ANGIOGRAPHY CHEST, ABDOMEN AND PELVIS  TECHNIQUE: Multidetector CT imaging through the chest, abdomen and pelvis was performed using the standard protocol during bolus administration of intravenous contrast. Multiplanar reconstructed images and MIPs were obtained and reviewed to evaluate the vascular anatomy.  CONTRAST:  100 cc Omnipaque 350 intravenous  COMPARISON:  None.  FINDINGS: CTA CHEST FINDINGS  THORACIC INLET/BODY WALL:  No acute abnormality.  MEDIASTINUM:  No aortic intramural hematoma or dissection. No aortic aneurysm. Mild atheromatous changes, greatest along the aortic arch. No cardiomegaly or pericardial effusion. No lymphadenopathy.  LUNG WINDOWS:  Bibasilar atelectasis. Lobulated pulmonary nodule in the right lower lobe, nearly touching the major fissure, measuring 11 mm (measured coronally).  OSSEOUS:  See below  Review of the MIP images confirms the above findings.  CTA ABDOMEN AND PELVIS FINDINGS  Abdominal wall: 3 small midline abdominal wall hernias containing fat. The upper hernia also contains pneumoperitoneum.  Hepatobiliary: Liver cysts, up to 32 mm in segment 4B. Possible hepatic steatosis.Layering high density within the gallbladder could be small stones or sludge. No acute inflammatory changes.  Pancreas: Unremarkable.  Spleen: Unremarkable.  Adrenals/Urinary Tract: Negative adrenals. No hydronephrosis or stone. Early excretion of contrast, reaching the right bladder, likely test bolus. 26 mm right renal cyst. Left renal sinus cysts. Unremarkable bladder.  Reproductive:Incidental calcified fundal fibroid measuring 17 mm.  Stomach/Bowel: Pneumoperitoneum consistent with bowel perforation. Accumulation of gas in the upper abdomen and a visible mucosa defect in the ventral gastric body suggest a gastric ulcer perforation. There is mild colonic diverticulosis distally but no surrounding pneumoperitoneum. Reactive ascites and omental fat edema. No obstruction. No appendicitis.   Vascular/Lymphatic: Aortic atherosclerosis without aneurysm or dissection. Moderate stenosis of the right renal artery proximally. No other visceral branch stenosis. No mass or adenopathy.  Musculoskeletal: Diffuse disc degeneration, status post L4-5 discectomy with posterior rod and pedicle screw fixation. Remote appearing T5, T6, T8 and T11 superior endplate fractures.  Critical Value/emergent results and pulmonary nodule were called by telephone at the time of interpretation on 10/16/2014 at 8:53 pm to Dr. Dalia Heading , who verbally acknowledged these results.  Review of the MIP images confirms the above findings.  IMPRESSION: 1. Bowel perforation with moderate pneumoperitoneum, strongly favor a gastric ulcer. 2. 11 mm right lower lobe pulmonary nodule, concerning for malignancy in this smoker. The nodule is at the limits of PET resolution. Could attempt PET or short followup chest CT (on the order of 3 months). 3. No acute aortic findings. 4. Moderate right renal artery stenosis. 5. Gallbladder sludge or small calculi.   Electronically Signed   By: Monte Fantasia M.D.   On: 10/16/2014 21:07   Ct Angio Abd/pel W/ And/or W/o  10/16/2014   CLINICAL DATA:  Severe abdominal and chest pain  EXAM: CT ANGIOGRAPHY CHEST, ABDOMEN AND PELVIS  TECHNIQUE: Multidetector CT imaging through the chest, abdomen and pelvis was performed using the standard protocol during bolus administration of intravenous contrast. Multiplanar reconstructed images and MIPs were obtained and reviewed to evaluate the vascular anatomy.  CONTRAST:  100 cc Omnipaque 350 intravenous  COMPARISON:  None.  FINDINGS: CTA CHEST FINDINGS  THORACIC INLET/BODY WALL:  No acute abnormality.  MEDIASTINUM:  No aortic intramural hematoma or dissection. No aortic aneurysm. Mild atheromatous changes, greatest along the aortic arch. No cardiomegaly or pericardial effusion. No lymphadenopathy.  LUNG WINDOWS:  Bibasilar atelectasis. Lobulated pulmonary nodule  in the right lower lobe, nearly  touching the major fissure, measuring 11 mm (measured coronally).  OSSEOUS:  See below  Review of the MIP images confirms the above findings.  CTA ABDOMEN AND PELVIS FINDINGS  Abdominal wall: 3 small midline abdominal wall hernias containing fat. The upper hernia also contains pneumoperitoneum.  Hepatobiliary: Liver cysts, up to 32 mm in segment 4B. Possible hepatic steatosis.Layering high density within the gallbladder could be small stones or sludge. No acute inflammatory changes.  Pancreas: Unremarkable.  Spleen: Unremarkable.  Adrenals/Urinary Tract: Negative adrenals. No hydronephrosis or stone. Early excretion of contrast, reaching the right bladder, likely test bolus. 26 mm right renal cyst. Left renal sinus cysts. Unremarkable bladder.  Reproductive:Incidental calcified fundal fibroid measuring 17 mm.  Stomach/Bowel: Pneumoperitoneum consistent with bowel perforation. Accumulation of gas in the upper abdomen and a visible mucosa defect in the ventral gastric body suggest a gastric ulcer perforation. There is mild colonic diverticulosis distally but no surrounding pneumoperitoneum. Reactive ascites and omental fat edema. No obstruction. No appendicitis.  Vascular/Lymphatic: Aortic atherosclerosis without aneurysm or dissection. Moderate stenosis of the right renal artery proximally. No other visceral branch stenosis. No mass or adenopathy.  Musculoskeletal: Diffuse disc degeneration, status post L4-5 discectomy with posterior rod and pedicle screw fixation. Remote appearing T5, T6, T8 and T11 superior endplate fractures.  Critical Value/emergent results and pulmonary nodule were called by telephone at the time of interpretation on 10/16/2014 at 8:53 pm to Dr. Dalia Heading , who verbally acknowledged these results.  Review of the MIP images confirms the above findings.  IMPRESSION: 1. Bowel perforation with moderate pneumoperitoneum, strongly favor a gastric ulcer. 2. 11 mm  right lower lobe pulmonary nodule, concerning for malignancy in this smoker. The nodule is at the limits of PET resolution. Could attempt PET or short followup chest CT (on the order of 3 months). 3. No acute aortic findings. 4. Moderate right renal artery stenosis. 5. Gallbladder sludge or small calculi.   Electronically Signed   By: Monte Fantasia M.D.   On: 10/16/2014 21:07    Review of Systems  Constitutional: Negative.   HENT: Negative.   Eyes: Negative.   Respiratory: Negative for shortness of breath.   Cardiovascular: Positive for chest pain.  Gastrointestinal: Positive for abdominal pain. Negative for nausea and vomiting.  Genitourinary: Negative.   Musculoskeletal: Negative.   Skin: Negative.   Endo/Heme/Allergies: Negative.     Blood pressure 171/85, pulse 88, temperature 97.6 F (36.4 C), temperature source Oral, resp. rate 24, height $RemoveBe'4\' 8"'OQLrTyaGK$  (1.422 m), weight 74.844 kg (165 lb), SpO2 97 %. Physical Exam  Constitutional: She is oriented to person, place, and time. She appears well-developed and well-nourished. She appears distressed.  HENT:  Head: Normocephalic and atraumatic.  Right Ear: External ear normal.  Left Ear: External ear normal.  Nose: Nose normal.  Mouth/Throat: Oropharynx is clear and moist.  Eyes: EOM are normal. Pupils are equal, round, and reactive to light.  Neck: Neck supple. No tracheal deviation present.  Cardiovascular: Normal rate and normal heart sounds.   Respiratory: Effort normal and breath sounds normal. No stridor. No respiratory distress. She has no wheezes. She has no rales.  GI: She exhibits no distension. There is tenderness. There is rebound and guarding.  Upper abdominal tenderness with guarding and peritonitis, hypoactive bowel sounds  Musculoskeletal: Normal range of motion.  Neurological: She is alert and oriented to person, place, and time.  Skin: Skin is warm.  Psychiatric:  Anxious     Assessment/Plan Free intraperitoneal air  and  peritonitis with likely perforated gastric ulcer - IV Zosyn and will take emergently to the operating went for exploratory laparotomy and repair of perforated ulcer. Procedure, risks, and benefits were discussed in detail with her and her children. She is agreeable. I discussed the expected postoperative course with her.  Erisa Mehlman E 10/16/2014, 9:46 PM

## 2014-10-16 NOTE — ED Notes (Signed)
Family member came to desk irate requesting pain meds for pt. Explained procedure and delay to pt. Dr. Ree Kida. PA was otw into room and notified as well.

## 2014-10-16 NOTE — ED Notes (Addendum)
Patient transported to CT with this RN 

## 2014-10-16 NOTE — ED Notes (Signed)
Pt arrives via EMS from home. Pt was cooking dinner when she had sudden onset midsternal CP radiating to left arm. Worse with deep breaths. Initial BP by EMS was 220/150. EMS administered 3 NTG and 324 ASA. BP came down to 160/128 by EMS.

## 2014-10-17 ENCOUNTER — Inpatient Hospital Stay (HOSPITAL_COMMUNITY): Payer: Medicare Other

## 2014-10-17 ENCOUNTER — Encounter (HOSPITAL_COMMUNITY): Payer: Self-pay | Admitting: General Surgery

## 2014-10-17 DIAGNOSIS — R911 Solitary pulmonary nodule: Secondary | ICD-10-CM

## 2014-10-17 DIAGNOSIS — K521 Toxic gastroenteritis and colitis: Secondary | ICD-10-CM

## 2014-10-17 DIAGNOSIS — J9601 Acute respiratory failure with hypoxia: Secondary | ICD-10-CM

## 2014-10-17 LAB — POCT I-STAT 3, ART BLOOD GAS (G3+)
ACID-BASE DEFICIT: 5 mmol/L — AB (ref 0.0–2.0)
Acid-base deficit: 6 mmol/L — ABNORMAL HIGH (ref 0.0–2.0)
BICARBONATE: 22.8 meq/L (ref 20.0–24.0)
Bicarbonate: 22.8 mEq/L (ref 20.0–24.0)
O2 SAT: 96 %
O2 Saturation: 97 %
PCO2 ART: 57.6 mmHg — AB (ref 35.0–45.0)
PH ART: 7.204 — AB (ref 7.350–7.450)
Patient temperature: 98.1
TCO2: 25 mmol/L (ref 0–100)
TCO2: 24 mmol/L (ref 0–100)
pCO2 arterial: 50.1 mmHg — ABNORMAL HIGH (ref 35.0–45.0)
pH, Arterial: 7.265 — ABNORMAL LOW (ref 7.350–7.450)
pO2, Arterial: 96 mmHg (ref 80.0–100.0)
pO2, Arterial: 109 mmHg — ABNORMAL HIGH (ref 80.0–100.0)

## 2014-10-17 LAB — URINE MICROSCOPIC-ADD ON

## 2014-10-17 LAB — BASIC METABOLIC PANEL
Anion gap: 9 (ref 5–15)
BUN: 28 mg/dL — ABNORMAL HIGH (ref 6–20)
CHLORIDE: 108 mmol/L (ref 101–111)
CO2: 23 mmol/L (ref 22–32)
CREATININE: 1.27 mg/dL — AB (ref 0.44–1.00)
Calcium: 8 mg/dL — ABNORMAL LOW (ref 8.9–10.3)
GFR calc non Af Amer: 42 mL/min — ABNORMAL LOW (ref >60)
GFR, EST AFRICAN AMERICAN: 49 mL/min — AB (ref >60)
Glucose, Bld: 182 mg/dL — ABNORMAL HIGH (ref 65–99)
Potassium: 3.7 mmol/L (ref 3.5–5.1)
Sodium: 140 mmol/L (ref 135–145)

## 2014-10-17 LAB — URINALYSIS, ROUTINE W REFLEX MICROSCOPIC
Bilirubin Urine: NEGATIVE
Glucose, UA: NEGATIVE mg/dL
Hgb urine dipstick: NEGATIVE
Ketones, ur: NEGATIVE mg/dL
LEUKOCYTES UA: NEGATIVE
NITRITE: NEGATIVE
PROTEIN: 30 mg/dL — AB
Specific Gravity, Urine: 1.044 — ABNORMAL HIGH (ref 1.005–1.030)
UROBILINOGEN UA: 0.2 mg/dL (ref 0.0–1.0)
pH: 7 (ref 5.0–8.0)

## 2014-10-17 LAB — CBC
HEMATOCRIT: 39.8 % (ref 36.0–46.0)
HEMOGLOBIN: 12.7 g/dL (ref 12.0–15.0)
MCH: 30.5 pg (ref 26.0–34.0)
MCHC: 31.9 g/dL (ref 30.0–36.0)
MCV: 95.4 fL (ref 78.0–100.0)
Platelets: 292 10*3/uL (ref 150–400)
RBC: 4.17 MIL/uL (ref 3.87–5.11)
RDW: 15.7 % — ABNORMAL HIGH (ref 11.5–15.5)
WBC: 20.6 10*3/uL — ABNORMAL HIGH (ref 4.0–10.5)

## 2014-10-17 LAB — GLUCOSE, CAPILLARY
GLUCOSE-CAPILLARY: 166 mg/dL — AB (ref 65–99)
GLUCOSE-CAPILLARY: 178 mg/dL — AB (ref 65–99)
GLUCOSE-CAPILLARY: 201 mg/dL — AB (ref 65–99)
Glucose-Capillary: 127 mg/dL — ABNORMAL HIGH (ref 65–99)
Glucose-Capillary: 165 mg/dL — ABNORMAL HIGH (ref 65–99)
Glucose-Capillary: 130 mg/dL — ABNORMAL HIGH (ref 65–99)

## 2014-10-17 LAB — MRSA PCR SCREENING: MRSA by PCR: NEGATIVE

## 2014-10-17 LAB — TRIGLYCERIDES: Triglycerides: 66 mg/dL (ref <150)

## 2014-10-17 MED ORDER — FLUCONAZOLE IN SODIUM CHLORIDE 400-0.9 MG/200ML-% IV SOLN
400.0000 mg | INTRAVENOUS | Status: DC
  Administered 2014-10-17 – 2014-10-21 (×5): 400 mg via INTRAVENOUS
  Filled 2014-10-17 (×5): qty 200

## 2014-10-17 MED ORDER — INSULIN ASPART 100 UNIT/ML ~~LOC~~ SOLN
0.0000 [IU] | SUBCUTANEOUS | Status: DC
  Administered 2014-10-17: 2 [IU] via SUBCUTANEOUS
  Administered 2014-10-17 (×2): 4 [IU] via SUBCUTANEOUS
  Administered 2014-10-17: 2 [IU] via SUBCUTANEOUS
  Administered 2014-10-17: 8 [IU] via SUBCUTANEOUS
  Administered 2014-10-17: 4 [IU] via SUBCUTANEOUS
  Administered 2014-10-18: 2 [IU] via SUBCUTANEOUS
  Administered 2014-10-18: 4 [IU] via SUBCUTANEOUS
  Administered 2014-10-18 – 2014-10-22 (×3): 2 [IU] via SUBCUTANEOUS
  Administered 2014-10-22: 8 [IU] via SUBCUTANEOUS
  Administered 2014-10-23 (×3): 2 [IU] via SUBCUTANEOUS
  Administered 2014-10-23: 4 [IU] via SUBCUTANEOUS

## 2014-10-17 MED ORDER — HYDROMORPHONE 0.3 MG/ML IV SOLN
INTRAVENOUS | Status: DC
  Administered 2014-10-17 (×2): via INTRAVENOUS
  Administered 2014-10-18: 2.7 mg via INTRAVENOUS
  Administered 2014-10-18: 5.4 mg via INTRAVENOUS
  Administered 2014-10-18: 06:00:00 via INTRAVENOUS
  Administered 2014-10-18: 1.8 mg via INTRAVENOUS
  Administered 2014-10-18: 2.1 mg via INTRAVENOUS
  Administered 2014-10-18: 16:00:00 via INTRAVENOUS
  Administered 2014-10-19: 1.8 mL via INTRAVENOUS
  Administered 2014-10-19: 08:00:00 via INTRAVENOUS
  Filled 2014-10-17 (×5): qty 25

## 2014-10-17 MED ORDER — ONDANSETRON HCL 4 MG/2ML IJ SOLN: 4.0000 mg | Freq: Four times a day (QID) | INTRAMUSCULAR | Status: DC | PRN

## 2014-10-17 MED ORDER — INFLUENZA VAC SPLIT QUAD 0.5 ML IM SUSY
0.5000 mL | PREFILLED_SYRINGE | INTRAMUSCULAR | Status: AC
  Administered 2014-10-18: 0.5 mL via INTRAMUSCULAR
  Filled 2014-10-17: qty 0.5

## 2014-10-17 MED ORDER — ALBUMIN HUMAN 5 % IV SOLN
12.5000 g | Freq: Once | INTRAVENOUS | Status: AC
  Administered 2014-10-17: 12.5 g via INTRAVENOUS
  Filled 2014-10-17: qty 250

## 2014-10-17 MED ORDER — ONDANSETRON 4 MG PO TBDP
4.0000 mg | ORAL_TABLET | Freq: Four times a day (QID) | ORAL | Status: DC | PRN
  Filled 2014-10-17: qty 1

## 2014-10-17 MED ORDER — DEXTROSE-NACL 5-0.45 % IV SOLN
INTRAVENOUS | Status: DC
  Administered 2014-10-17: 02:00:00 via INTRAVENOUS
  Filled 2014-10-17 (×5): qty 1000

## 2014-10-17 MED ORDER — MORPHINE SULFATE (PF) 2 MG/ML IV SOLN
2.0000 mg | INTRAVENOUS | Status: DC | PRN
  Administered 2014-10-17: 4 mg via INTRAVENOUS
  Filled 2014-10-17: qty 2

## 2014-10-17 MED ORDER — ENOXAPARIN SODIUM 40 MG/0.4ML ~~LOC~~ SOLN
40.0000 mg | SUBCUTANEOUS | Status: DC
  Administered 2014-10-17 – 2014-10-23 (×7): 40 mg via SUBCUTANEOUS
  Filled 2014-10-17 (×9): qty 0.4

## 2014-10-17 MED ORDER — DIPHENHYDRAMINE HCL 50 MG/ML IJ SOLN: 12.5000 mg | Freq: Four times a day (QID) | INTRAMUSCULAR | Status: DC | PRN

## 2014-10-17 MED ORDER — DIPHENHYDRAMINE HCL 12.5 MG/5ML PO ELIX
12.5000 mg | ORAL_SOLUTION | Freq: Four times a day (QID) | ORAL | Status: DC | PRN
  Filled 2014-10-17: qty 5

## 2014-10-17 MED ORDER — CHLORHEXIDINE GLUCONATE 0.12% ORAL RINSE (MEDLINE KIT)
15.0000 mL | Freq: Two times a day (BID) | OROMUCOSAL | Status: DC
  Administered 2014-10-17 – 2014-10-21 (×9): 15 mL via OROMUCOSAL

## 2014-10-17 MED ORDER — PANTOPRAZOLE SODIUM 40 MG IV SOLR
40.0000 mg | Freq: Every day | INTRAVENOUS | Status: DC
Start: 2014-10-17 — End: 2014-10-18
  Administered 2014-10-17 (×2): 40 mg via INTRAVENOUS
  Filled 2014-10-17 (×3): qty 40

## 2014-10-17 MED ORDER — ANTISEPTIC ORAL RINSE SOLUTION (CORINZ)
7.0000 mL | Freq: Four times a day (QID) | OROMUCOSAL | Status: DC
  Administered 2014-10-17 – 2014-10-22 (×18): 7 mL via OROMUCOSAL

## 2014-10-17 MED ORDER — IPRATROPIUM-ALBUTEROL 0.5-2.5 (3) MG/3ML IN SOLN: 3.0000 mL | Freq: Four times a day (QID) | RESPIRATORY_TRACT | Status: DC | PRN

## 2014-10-17 MED ORDER — PROPOFOL 1000 MG/100ML IV EMUL
0.0000 ug/kg/min | INTRAVENOUS | Status: DC
  Administered 2014-10-17: 35 ug/kg/min via INTRAVENOUS
  Administered 2014-10-17: 40 ug/kg/min via INTRAVENOUS
  Filled 2014-10-17: qty 100

## 2014-10-17 MED ORDER — PIPERACILLIN-TAZOBACTAM 3.375 G IVPB
3.3750 g | Freq: Three times a day (TID) | INTRAVENOUS | Status: DC
  Administered 2014-10-17 – 2014-10-21 (×13): 3.375 g via INTRAVENOUS
  Filled 2014-10-17 (×17): qty 50

## 2014-10-17 MED ORDER — SODIUM CHLORIDE 0.9 % IV SOLN
25.0000 ug/h | INTRAVENOUS | Status: DC
  Administered 2014-10-17: 50 ug/h via INTRAVENOUS
  Filled 2014-10-17 (×2): qty 50

## 2014-10-17 MED ORDER — FENTANYL BOLUS VIA INFUSION
25.0000 ug | INTRAVENOUS | Status: DC | PRN
  Filled 2014-10-17: qty 25

## 2014-10-17 MED ORDER — FENTANYL CITRATE (PF) 100 MCG/2ML IJ SOLN: 50.0000 ug | Freq: Once | INTRAMUSCULAR | Status: DC

## 2014-10-17 MED ORDER — VANCOMYCIN HCL IN DEXTROSE 1-5 GM/200ML-% IV SOLN
1000.0000 mg | INTRAVENOUS | Status: DC
  Administered 2014-10-17 – 2014-10-20 (×4): 1000 mg via INTRAVENOUS
  Filled 2014-10-17 (×4): qty 200

## 2014-10-17 MED ORDER — NALOXONE HCL 0.4 MG/ML IJ SOLN: 0.4000 mg | INTRAMUSCULAR | Status: DC | PRN

## 2014-10-17 MED ORDER — IPRATROPIUM-ALBUTEROL 0.5-2.5 (3) MG/3ML IN SOLN
3.0000 mL | Freq: Four times a day (QID) | RESPIRATORY_TRACT | Status: DC
  Administered 2014-10-17 (×2): 3 mL via RESPIRATORY_TRACT
  Filled 2014-10-17 (×2): qty 3

## 2014-10-17 MED ORDER — FENTANYL CITRATE (PF) 100 MCG/2ML IJ SOLN
25.0000 ug | INTRAMUSCULAR | Status: DC | PRN
  Administered 2014-10-17: 50 ug via INTRAVENOUS
  Filled 2014-10-17 (×2): qty 2

## 2014-10-17 MED ORDER — SODIUM CHLORIDE 0.9 % IJ SOLN: 9.0000 mL | INTRAMUSCULAR | Status: DC | PRN

## 2014-10-17 NOTE — Progress Notes (Addendum)
ABG results called into E-link, RT at bedside and received orders to switch patient to [facility] and repeat ABG in 1 hour.

## 2014-10-17 NOTE — Anesthesia Postprocedure Evaluation (Signed)
  Anesthesia Post-op Note  Patient: Sally Douglas  Procedure(s) Performed: Procedure(s): EXPLORATORY LAPAROTOMY, REPAIR OF GASTRIC ULCER (N/A)  Patient Location: SICU  Anesthesia Type:General  Level of Consciousness: Patient remains intubated per anesthesia plan  Airway and Oxygen Therapy: Patient remains intubated per anesthesia plan and Patient placed on Ventilator (see vital sign flow sheet for setting)  Post-op Pain: none  Post-op Assessment: Post-op Vital signs reviewed, Patient's Cardiovascular Status Stable and Respiratory Function Stable Patient remained intubated post-operatively.       Post-op Vital Signs: Reviewed and stable  Last Vitals:  Filed Vitals:   10/17/14 0530  BP: 103/60  Pulse: 73  Temp:   Resp: 16    Complications: No apparent anesthesia complications

## 2014-10-17 NOTE — Progress Notes (Signed)
CRITICAL VALUE ALERT  Critical value received:gram stain: gram + cocci clusters and pairs; abundant white cells, poly and mononuclear  Date of notification:  10/17/14  Time of notification:  1:15  Critical value read back:Yes.    Nurse who received alert:  Norva Pavlov, RN  MD notified (1st page):  A. Tremble  Time of first page:  1:20 (was on unit; reported verbally)  MD notified (2nd page):  Time of second page:  Responding MD:  A. Tremble  Time MD responded:  1:20

## 2014-10-17 NOTE — Progress Notes (Signed)
Pt. Placed on pressure support by MD.

## 2014-10-17 NOTE — Progress Notes (Signed)
ANTIBIOTIC CONSULT NOTE - INITIAL  Pharmacy Consult for Vancomycin Indication: cellulitis  Allergies  Allergen Reactions  . K-Dur [Potassium Chloride] Swelling    Face Swelling    Patient Measurements: Height: 4\' 8"  (142.2 cm) Weight: 165 lb (74.844 kg) IBW/kg (Calculated) : 36.3 Adjusted Body Weight:   Vital Signs: Temp: 98.1 F (36.7 C) (09/19 0114) Temp Source: Oral (09/19 0114) BP: 108/64 mmHg (09/19 0045) Pulse Rate: 73 (09/19 0045) Intake/Output from previous day: 09/18 0701 - 09/19 0700 In: 1500 [I.V.:1500] Out: 200 [Urine:200] Intake/Output from this shift: Total I/O In: 1500 [I.V.:1500] Out: 200 [Urine:200]  Labs:  Recent Labs  10/16/14 2013 10/16/14 2019  WBC 24.9*  --   HGB 14.5  --   PLT 378  --   CREATININE 1.16* 1.10*   Estimated Creatinine Clearance: 39.4 mL/min (by C-G formula based on Cr of 1.1). No results for input(s): VANCOTROUGH, VANCOPEAK, VANCORANDOM, GENTTROUGH, GENTPEAK, GENTRANDOM, TOBRATROUGH, TOBRAPEAK, TOBRARND, AMIKACINPEAK, AMIKACINTROU, AMIKACIN in the last 72 hours.   Microbiology: Recent Results (from the past 720 hour(s))  Body fluid culture     Status: None (Preliminary result)   Collection Time: 10/16/14 10:55 PM  Result Value Ref Range Status   Specimen Description FLUID PERITONEAL  Final   Special Requests NONE  Final   Gram Stain   Final    DIRECT SMEAR ABUNDANT WBC PRESENT,BOTH PMN AND MONONUCLEAR GRAM POSITIVE COCCI IN PAIRS IN CLUSTERS CRITICAL RESULT CALLED TO, READ BACK BY AND VERIFIED WITH: S.MYERS,RN [id number] 10/17/14 M.CAMPBELL CONFIRMED BY B.MARTIN    Culture PENDING  Incomplete   Report Status PENDING  Incomplete    Medical History: Past Medical History  Diagnosis Date  . Insomnia     takes Ambien nightly  . Obesity   . Menopausal syndrome   . Tobacco abuse   . Low back pain   . Bronchitis     hx of-many yrs ago  . Abnormal finding on Pap smear     hx abnl pap  . Hypertension     takes  Diltiazem and HCTZ daily  . Constipation     takes Miralax daily  . Arthritis   . Joint pain   . Joint swelling     Medications:  Prescriptions prior to admission  Medication Sig Dispense Refill Last Dose  . aspirin 81 MG tablet Take 81 mg by mouth every morning.    10/16/2014 at Unknown time  . diltiazem (TIAZAC) 360 MG 24 hr capsule Take 1 capsule (360 mg total) by mouth daily. 90 capsule 1 10/16/2014 at Unknown time  . furosemide (LASIX) 20 MG tablet Take 20 mg by mouth daily as needed for fluid or edema.   NOT USED  . hydrochlorothiazide (HYDRODIURIL) 25 MG tablet Take 1 tablet (25 mg total) by mouth daily. 90 tablet 3 10/16/2014 at Unknown time  . HYDROcodone-acetaminophen (NORCO) 10-325 MG per tablet Take 1 tablet by mouth every 6 (six) hours as needed (Prescribed by Dr. Maryjean Ka).   0 10/16/2014 at Unknown time  . meloxicam (MOBIC) 7.5 MG tablet Take 7.5 mg by mouth 2 (two) times daily. Prescribed by Dr. Maryjean Ka   10/16/2014 at Unknown time  . polyethylene glycol powder (GLYCOLAX/MIRALAX) powder Take 17 g by mouth daily as needed for moderate constipation.    Past Month at Unknown time  . tiZANidine (ZANAFLEX) 4 MG tablet Take 4 mg by mouth every 8 (eight) hours as needed for muscle spasms.   Past Month at Unknown time  . VOLTAREN  1 % GEL Apply 1 application topically daily as needed. FOR HANDS  2 Past Week at Unknown time  . zolpidem (AMBIEN) 10 MG tablet TAKE 1 TABLET BY MOUTH AT BEDTIME AS NEEDED FOR SLEEP (Patient taking differently: TAKE 1 TABLET BY MOUTH AT BEDTIME FOR SLEEP) 30 tablet 2 10/15/2014 at Unknown time  . nortriptyline (PAMELOR) 25 MG capsule Take 1-2 capsules (25-50 mg total) by mouth at bedtime. 180 capsule 1 Taking   Scheduled:  . enoxaparin (LOVENOX) injection  40 mg Subcutaneous Q24H  . fentaNYL (SUBLIMAZE) injection  50 mcg Intravenous Once  . insulin aspart  0-24 Units Subcutaneous 6 times per day  . ipratropium-albuterol  3 mL Nebulization Q6H  . pantoprazole  (PROTONIX) IV  40 mg Intravenous QHS  . piperacillin-tazobactam (ZOSYN)  IV  3.375 g Intravenous Q8H   Infusions:  . fentaNYL infusion INTRAVENOUS 50 mcg/hr (10/17/14 0031)  . propofol (DIPRIVAN) infusion 30 mcg/kg/min (10/17/14 0118)   Assessment: 69yo female with history of obesity, HTN, and ventral hernia s/p repair presents with abdominal pain. Pharmacy is consulted to dose vancomycin for a perforated gastric ulcer. Pt is afebrile, WBC 24.9, sCr 1.1.  Goal of Therapy:  Vancomycin trough level 15-20 mcg/ml  Plan:  Vancomycin 1g IV q24h Continue Zosyn 3.375g IV q8h Measure antibiotic drug levels at steady state Follow up culture results, renal function and clinical course  Andrey Cota. Diona Foley, PharmD Clinical Pharmacist Pager [phone] 10/17/2014,1:23 AM

## 2014-10-17 NOTE — Consult Note (Signed)
PULMONARY  / [place]   Name: Sally Douglas MRN: [id number] DOB: [birth year]    ADMISSION DATE:  10/16/2014 CONSULTATION DATE: October 17, 2014  REQUESTING CLINICIAN: Md Ccs, MD PRIMARY SERVICE: Surgery  CHIEF COMPLAINT:  Abdominal Pain  BRIEF PATIENT DESCRIPTION: 69 y/o woman with sudden onset abdominal pain found to have perforated gastric ulcer.   SIGNIFICANT EVENTS / STUDIES:  9/18 CT of Chest/Abd/Pelvis ->> Free peritoneal air, 11 mm lung nodule. 9/19 Ex Lap w/ repair of gastric perforation  LINES / TUBES: 7 mm ETT 10/16/14 JP drain in abdomen 10/16/14 Foley 10/15/17  CULTURES: Blood 9/18 Intra-op peritoneal fluid 9/18  ANTIBIOTICS: Pip-tazo 9/19 >>  HISTORY OF PRESENT ILLNESS:   Sally Douglas is a 69 y/o woman with a history of ventral hernia s/p repair as well as tobacco abuse who presented with sudden onset abdominal pain and was found to have a perforated gastric ulcer. Incidentally, an 11 cm nodule was seen in her RLL without mediastinal adenoapathy. She underwent ex-lap with washout and repair on 9/18. Biopsies were taken. Following the procedure, she had low tidal volumes on pressure support, and she was taken to the ICU intubated. PCCM was consulted for evaluation of the nodule and ventilator management.  PAST MEDICAL HISTORY :  Past Medical History  Diagnosis Date  . Insomnia     takes Ambien nightly  . Obesity   . Menopausal syndrome   . Tobacco abuse   . Low back pain   . Bronchitis     hx of-many yrs ago  . Abnormal finding on Pap smear     hx abnl pap  . Hypertension     takes Diltiazem and HCTZ daily  . Constipation     takes Miralax daily  . Arthritis   . Joint pain   . Joint swelling    Past Surgical History  Procedure Laterality Date  . Tonsillectomy    . Dilation and curettage of uterus    . Ventral hernia repair  01/24/2011    Procedure: LAPAROSCOPIC VENTRAL HERNIA;  Surgeon: Adin Hector, MD;  Location: [facility];   Service: General;  Laterality: N/A;  with Mesh  . Maximum access (mas)posterior lumbar interbody fusion (plif) 1 level N/A 11/04/2012    Procedure: Lumbar four-five Maximum access posterior lumbar interbody fusion with Nuvasive;  Surgeon: Eustace Moore, MD;  Location: [facility];  Service: Neurosurgery;  Laterality: N/A;  Lumbar four-five Maximum access posterior lumbar interbody fusion with Nuvasive   Prior to Admission medications   Medication Sig Start Date End Date Taking? Authorizing Provider  aspirin 81 MG tablet Take 81 mg by mouth every morning.    Yes Historical Provider, MD  diltiazem (TIAZAC) 360 MG 24 hr capsule Take 1 capsule (360 mg total) by mouth daily. 06/01/14  Yes Marletta Lor, MD  furosemide (LASIX) 20 MG tablet Take 20 mg by mouth daily as needed for fluid or edema.   Yes Historical Provider, MD  hydrochlorothiazide (HYDRODIURIL) 25 MG tablet Take 1 tablet (25 mg total) by mouth daily. 11/29/13  Yes Marletta Lor, MD  HYDROcodone-acetaminophen [facility]) 10-325 MG per tablet Take 1 tablet by mouth every 6 (six) hours as needed (Prescribed by Dr. Maryjean Ka).  05/18/14  Yes Historical Provider, MD  meloxicam (MOBIC) 7.5 MG tablet Take 7.5 mg by mouth 2 (two) times daily. Prescribed by Dr. Maryjean Ka   Yes Historical Provider, MD  polyethylene glycol powder (GLYCOLAX/MIRALAX) powder Take 17 g by  mouth daily as needed for moderate constipation.    Yes Historical Provider, MD  tiZANidine (ZANAFLEX) 4 MG tablet Take 4 mg by mouth every 8 (eight) hours as needed for muscle spasms.   Yes Historical Provider, MD  VOLTAREN 1 % GEL Apply 1 application topically daily as needed. FOR HANDS 09/17/14  Yes Historical Provider, MD  zolpidem (AMBIEN) 10 MG tablet TAKE 1 TABLET BY MOUTH AT BEDTIME AS NEEDED FOR SLEEP Patient taking differently: TAKE 1 TABLET BY MOUTH AT BEDTIME FOR SLEEP 08/15/14  Yes Marletta Lor, MD  nortriptyline (PAMELOR) 25 MG capsule Take 1-2 capsules (25-50 mg  total) by mouth at bedtime. 02/17/14   Marletta Lor, MD   Allergies  Allergen Reactions  . K-Dur [Potassium Chloride] Swelling    Face Swelling    FAMILY HISTORY:  Family History  Problem Relation Age of Onset  . Cancer Brother     renal ,also sister  . COPD Brother   . Hypertension Brother   . Diabetes Brother   . Kidney disease Brother   . Diabetes Brother     and sister  . Breast cancer      sister  . Kidney disease    . Cancer Mother     Breast Cancer  . Diabetes Mother   . Hypertension Mother   . Glaucoma Mother   . Diabetes Sister   . Hypertension Sister   . Heart disease Sister    SOCIAL HISTORY:  reports that she has been smoking Cigarettes.  She has a 6.25 pack-year smoking history. She has never used smokeless tobacco. She reports that she does not drink alcohol or use illicit drugs.  REVIEW OF SYSTEMS:  Unable to obtain  SUBJECTIVE:   VITAL SIGNS: Temp:  [97.6 F (36.4 C)] 97.6 F (36.4 C) (09/18 1838) Pulse Rate:  [73-91] 73 (09/19 0045) Resp:  [10-29] 10 (09/19 0045) BP: (108-197)/(64-105) 108/64 mmHg (09/19 0045) SpO2:  [91 %-97 %] 96 % (09/19 0045) FiO2 (%):  [40 %] 40 % (09/19 0045) Weight:  [165 lb (74.844 kg)] 165 lb (74.844 kg) (09/18 1838) HEMODYNAMICS:   VENTILATOR SETTINGS: Vent Mode:  [-] PRVC FiO2 (%):  [40 %] 40 % Set Rate:  [14 bmp-18 bmp] 18 bmp Vt Set:  [300 mL] 300 mL PEEP:  [5 cmH20] 5 cmH20 Plateau Pressure:  [15 cmH20] 15 cmH20 INTAKE / OUTPUT: Intake/Output      09/18 0701 - 09/19 0700   I.V. (mL/kg) 1500 (20)   Total Intake(mL/kg) 1500 (20)   Urine (mL/kg/hr) 200   Total Output 200   Net +1300         PHYSICAL EXAMINATION: General:  Obese woman intubated and sedated. Neuro:  Sedated, not following commands. HEENT:  MMM Neck:  Cardiovascular:  No LAD Lungs:  CTAB. Abdomen:  Dressings in place, were left undisturbed. JP drain in place. Musculoskeletal:  No joint swelling. Skin:  No  rashes.  LABS:  CBC  Recent Labs Lab 10/16/14 2013  WBC 24.9*  HGB 14.5  HCT 44.0  PLT 378   Coag's No results for input(s): APTT, INR in the last 168 hours. BMET  Recent Labs Lab 10/16/14 2013 10/16/14 2019  NA 137  --   K 3.5  --   CL 101  --   CO2 22  --   BUN 32*  --   CREATININE 1.16* 1.10*  GLUCOSE 148*  --    Electrolytes  Recent Labs Lab 10/16/14 2013  CALCIUM 9.0   Sepsis Markers  Recent Labs Lab 10/16/14 2020  LATICACIDVEN 1.59   ABG No results for input(s): PHART, PCO2ART, PO2ART in the last 168 hours. Liver Enzymes  Recent Labs Lab 10/16/14 2013  AST 22  ALT 33  ALKPHOS 71  BILITOT 0.7  ALBUMIN 3.6   Cardiac Enzymes No results for input(s): TROPONINI, PROBNP in the last 168 hours. Glucose No results for input(s): GLUCAP in the last 168 hours.  Imaging Dg Chest 2 View  10/16/2014   CLINICAL DATA:  Centralized chest pain X 1 DAY, chest pressure X 1 DAY, cough X 1 day, hx of htn  EXAM: CHEST  2 VIEW  COMPARISON:  10/27/2012  FINDINGS: Lung volumes are relatively low. There is bibasilar atelectasis with some atelectasis along the minor fissure. No convincing pneumonia and no pulmonary edema.  No pleural effusion or pneumothorax.  Cardiac silhouette is borderline enlarged. No mediastinal or hilar masses or convincing adenopathy.  Bony thorax is demineralized.  IMPRESSION: 1. Low lung volumes and lung base atelectasis and atelectasis along the minor fissure. No convincing pneumonia. No pulmonary edema.   Electronically Signed   By: Lajean Manes M.D.   On: 10/16/2014 19:44   Ct Angio Chest Aorta W/cm &/or Wo/cm  10/16/2014   CLINICAL DATA:  Severe abdominal and chest pain  EXAM: CT ANGIOGRAPHY CHEST, ABDOMEN AND PELVIS  TECHNIQUE: Multidetector CT imaging through the chest, abdomen and pelvis was performed using the standard protocol during bolus administration of intravenous contrast. Multiplanar reconstructed images and MIPs were obtained and  reviewed to evaluate the vascular anatomy.  CONTRAST:  100 cc Omnipaque 350 intravenous  COMPARISON:  None.  FINDINGS: CTA CHEST FINDINGS  THORACIC INLET/BODY WALL:  No acute abnormality.  MEDIASTINUM:  No aortic intramural hematoma or dissection. No aortic aneurysm. Mild atheromatous changes, greatest along the aortic arch. No cardiomegaly or pericardial effusion. No lymphadenopathy.  LUNG WINDOWS:  Bibasilar atelectasis. Lobulated pulmonary nodule in the right lower lobe, nearly touching the major fissure, measuring 11 mm (measured coronally).  OSSEOUS:  See below  Review of the MIP images confirms the above findings.  CTA ABDOMEN AND PELVIS FINDINGS  Abdominal wall: 3 small midline abdominal wall hernias containing fat. The upper hernia also contains pneumoperitoneum.  Hepatobiliary: Liver cysts, up to 32 mm in segment 4B. Possible hepatic steatosis.Layering high density within the gallbladder could be small stones or sludge. No acute inflammatory changes.  Pancreas: Unremarkable.  Spleen: Unremarkable.  Adrenals/Urinary Tract: Negative adrenals. No hydronephrosis or stone. Early excretion of contrast, reaching the right bladder, likely test bolus. 26 mm right renal cyst. Left renal sinus cysts. Unremarkable bladder.  Reproductive:Incidental calcified fundal fibroid measuring 17 mm.  Stomach/Bowel: Pneumoperitoneum consistent with bowel perforation. Accumulation of gas in the upper abdomen and a visible mucosa defect in the ventral gastric body suggest a gastric ulcer perforation. There is mild colonic diverticulosis distally but no surrounding pneumoperitoneum. Reactive ascites and omental fat edema. No obstruction. No appendicitis.  Vascular/Lymphatic: Aortic atherosclerosis without aneurysm or dissection. Moderate stenosis of the right renal artery proximally. No other visceral branch stenosis. No mass or adenopathy.  Musculoskeletal: Diffuse disc degeneration, status post L4-5 discectomy with posterior rod  and pedicle screw fixation. Remote appearing T5, T6, T8 and T11 superior endplate fractures.  Critical Value/emergent results and pulmonary nodule were called by telephone at the time of interpretation on 10/16/2014 at 8:53 pm to Dr. Dalia Heading , who verbally acknowledged these results.  Review of the MIP  images confirms the above findings.  IMPRESSION: 1. Bowel perforation with moderate pneumoperitoneum, strongly favor a gastric ulcer. 2. 11 mm right lower lobe pulmonary nodule, concerning for malignancy in this smoker. The nodule is at the limits of PET resolution. Could attempt PET or short followup chest CT (on the order of 3 months). 3. No acute aortic findings. 4. Moderate right renal artery stenosis. 5. Gallbladder sludge or small calculi.   Electronically Signed   By: Monte Fantasia M.D.   On: 10/16/2014 21:07   Ct Angio Abd/pel W/ And/or W/o  10/16/2014   CLINICAL DATA:  Severe abdominal and chest pain  EXAM: CT ANGIOGRAPHY CHEST, ABDOMEN AND PELVIS  TECHNIQUE: Multidetector CT imaging through the chest, abdomen and pelvis was performed using the standard protocol during bolus administration of intravenous contrast. Multiplanar reconstructed images and MIPs were obtained and reviewed to evaluate the vascular anatomy.  CONTRAST:  100 cc Omnipaque 350 intravenous  COMPARISON:  None.  FINDINGS: CTA CHEST FINDINGS  THORACIC INLET/BODY WALL:  No acute abnormality.  MEDIASTINUM:  No aortic intramural hematoma or dissection. No aortic aneurysm. Mild atheromatous changes, greatest along the aortic arch. No cardiomegaly or pericardial effusion. No lymphadenopathy.  LUNG WINDOWS:  Bibasilar atelectasis. Lobulated pulmonary nodule in the right lower lobe, nearly touching the major fissure, measuring 11 mm (measured coronally).  OSSEOUS:  See below  Review of the MIP images confirms the above findings.  CTA ABDOMEN AND PELVIS FINDINGS  Abdominal wall: 3 small midline abdominal wall hernias containing fat.  The upper hernia also contains pneumoperitoneum.  Hepatobiliary: Liver cysts, up to 32 mm in segment 4B. Possible hepatic steatosis.Layering high density within the gallbladder could be small stones or sludge. No acute inflammatory changes.  Pancreas: Unremarkable.  Spleen: Unremarkable.  Adrenals/Urinary Tract: Negative adrenals. No hydronephrosis or stone. Early excretion of contrast, reaching the right bladder, likely test bolus. 26 mm right renal cyst. Left renal sinus cysts. Unremarkable bladder.  Reproductive:Incidental calcified fundal fibroid measuring 17 mm.  Stomach/Bowel: Pneumoperitoneum consistent with bowel perforation. Accumulation of gas in the upper abdomen and a visible mucosa defect in the ventral gastric body suggest a gastric ulcer perforation. There is mild colonic diverticulosis distally but no surrounding pneumoperitoneum. Reactive ascites and omental fat edema. No obstruction. No appendicitis.  Vascular/Lymphatic: Aortic atherosclerosis without aneurysm or dissection. Moderate stenosis of the right renal artery proximally. No other visceral branch stenosis. No mass or adenopathy.  Musculoskeletal: Diffuse disc degeneration, status post L4-5 discectomy with posterior rod and pedicle screw fixation. Remote appearing T5, T6, T8 and T11 superior endplate fractures.  Critical Value/emergent results and pulmonary nodule were called by telephone at the time of interpretation on 10/16/2014 at 8:53 pm to Dr. Dalia Heading , who verbally acknowledged these results.  Review of the MIP images confirms the above findings.  IMPRESSION: 1. Bowel perforation with moderate pneumoperitoneum, strongly favor a gastric ulcer. 2. 11 mm right lower lobe pulmonary nodule, concerning for malignancy in this smoker. The nodule is at the limits of PET resolution. Could attempt PET or short followup chest CT (on the order of 3 months). 3. No acute aortic findings. 4. Moderate right renal artery stenosis. 5.  Gallbladder sludge or small calculi.   Electronically Signed   By: Monte Fantasia M.D.   On: 10/16/2014 21:07    EKG: NSR CXR: Fluid in R great fissure. Chest CT shows lung nodule.  ASSESSMENT / PLAN:  PULMONARY A: Need for Mechanical Ventilation  RLL Lung nodule, possible malignancy  P:   - Pulling OK volumes on pressure support 10/5, will check gas. Consider extubation in AM - Will need evaluation of RLL nodule. Given >1 cm size, will need PET and consideration for biopsy, possible primary resection if no evidence of spread on PET. This cannot be accurately performed, of course, until she recovers from her surgery and there is reduced inflammation in her abdomen. Recommend PET/CT in 4 weeks, to be done as outpatient.  CARDIOVASCULAR A: No acute issues. P:   ---  RENAL A: Mild renal insfufficency  P: May be chronic. Will need to be adequately hydrated given contrast load and surgery.  GASTROINTESTINAL A: Perforated gastric ulcer, s/p ex lap P:   Defer to primary team.  HEMATOLOGIC A: Possible malignancy P:   As above, will follow as outpatient.  INFECTIOUS A: Perforated gastric ucler P:   Agree with gram negative and anaerobic coverage. Will add vanc for additional gram positive coverage seen from gram stain.  ENDOCRINE A: Hyperglycemia P:   SSI  NEUROLOGIC A: Seed for sedation P:   Agree with Fentanyl and propfol infusions  TODAY'S SUMMARY: 69 y/o woman with perforated gastric ulcer and RLL nodule concerning for possible malignancy.  I have personally obtained a history, examined the patient, evaluated laboratory and imaging results, formulated the assessment and plan and placed orders.  CRITICAL CARE: The patient is critically ill with multiple organ systems failure and requires high complexity decision making for assessment and support, frequent evaluation and titration of therapies, application of advanced monitoring technologies and extensive  interpretation of multiple databases. Critical Care Time devoted to patient care services described in this note is 60 minutes.   Margarette Asal, MD Pulmonary and [place] Pager: [phone]   10/17/2014, 1:04 AM

## 2014-10-17 NOTE — Progress Notes (Addendum)
1 Day Post-Op  Subjective: Intubated/sedated  Objective: Vital signs in last 24 hours: Temp:  [97.6 F (36.4 C)-98.5 F (36.9 C)] 98.5 F (36.9 C) (09/19 0425) Pulse Rate:  [62-91] 71 (09/19 0600) Resp:  [7-29] 9 (09/19 0600) BP: (102-197)/(60-105) 109/62 mmHg (09/19 0600) SpO2:  [91 %-100 %] 100 % (09/19 0600) FiO2 (%):  [40 %] 40 % (09/19 0600) Weight:  [74.844 kg (165 lb)] 74.844 kg (165 lb) (09/18 1838)    Intake/Output from previous day: 09/18 0701 - 09/19 0700 In: 2509 [I.V.:2046.5; IV Piggyback:462.5] Out: 980 [Urine:840; Drains:140] Intake/Output this shift:    Intubated/sedated cta b/l Reg Obese, soft, serosang drainage on dressing; JP - serosang +SCDs  Lab Results:   Recent Labs  10/16/14 2013 10/17/14 0212  WBC 24.9* 20.6*  HGB 14.5 12.7  HCT 44.0 39.8  PLT 378 292   BMET  Recent Labs  10/16/14 2013 10/16/14 2019 10/17/14 0212  NA 137  --  140  K 3.5  --  3.7  CL 101  --  108  CO2 22  --  23  GLUCOSE 148*  --  182*  BUN 32*  --  28*  CREATININE 1.16* 1.10* 1.27*  CALCIUM 9.0  --  8.0*   PT/INR No results for input(s): LABPROT, INR in the last 72 hours. ABG  Recent Labs  10/17/14 0218 10/17/14 0451  PHART 7.204* 7.265*  HCO3 22.8 22.8    Studies/Results: Dg Chest 2 View  10/16/2014   CLINICAL DATA:  Centralized chest pain X 1 DAY, chest pressure X 1 DAY, cough X 1 day, hx of htn  EXAM: CHEST  2 VIEW  COMPARISON:  10/27/2012  FINDINGS: Lung volumes are relatively low. There is bibasilar atelectasis with some atelectasis along the minor fissure. No convincing pneumonia and no pulmonary edema.  No pleural effusion or pneumothorax.  Cardiac silhouette is borderline enlarged. No mediastinal or hilar masses or convincing adenopathy.  Bony thorax is demineralized.  IMPRESSION: 1. Low lung volumes and lung base atelectasis and atelectasis along the minor fissure. No convincing pneumonia. No pulmonary edema.   Electronically Signed   By: Lajean Manes M.D.   On: 10/16/2014 19:44   Dg Chest Port 1 View  10/17/2014   CLINICAL DATA:  Endotracheal tube placement  EXAM: PORTABLE CHEST - 1 VIEW  COMPARISON:  10/16/2014  FINDINGS: Endotracheal tube placed with tip measuring 3.7 cm above the carinal. Enteric tube tip is off the field of view but below the left hemidiaphragm. Linear atelectasis in the lung bases with fluid in the minor fissure on the right. No blunting of costophrenic angles. No pneumothorax. Mild cardiac enlargement.  IMPRESSION: Appliances appear in satisfactory location. Atelectasis in the lung bases with fluid in the right minor fissure.   Electronically Signed   By: Lucienne Capers M.D.   On: 10/17/2014 02:26   Ct Angio Chest Aorta W/cm &/or Wo/cm  10/16/2014   CLINICAL DATA:  Severe abdominal and chest pain  EXAM: CT ANGIOGRAPHY CHEST, ABDOMEN AND PELVIS  TECHNIQUE: Multidetector CT imaging through the chest, abdomen and pelvis was performed using the standard protocol during bolus administration of intravenous contrast. Multiplanar reconstructed images and MIPs were obtained and reviewed to evaluate the vascular anatomy.  CONTRAST:  100 cc Omnipaque 350 intravenous  COMPARISON:  None.  FINDINGS: CTA CHEST FINDINGS  THORACIC INLET/BODY WALL:  No acute abnormality.  MEDIASTINUM:  No aortic intramural hematoma or dissection. No aortic aneurysm. Mild atheromatous changes, greatest along  the aortic arch. No cardiomegaly or pericardial effusion. No lymphadenopathy.  LUNG WINDOWS:  Bibasilar atelectasis. Lobulated pulmonary nodule in the right lower lobe, nearly touching the major fissure, measuring 11 mm (measured coronally).  OSSEOUS:  See below  Review of the MIP images confirms the above findings.  CTA ABDOMEN AND PELVIS FINDINGS  Abdominal wall: 3 small midline abdominal wall hernias containing fat. The upper hernia also contains pneumoperitoneum.  Hepatobiliary: Liver cysts, up to 32 mm in segment 4B. Possible hepatic  steatosis.Layering high density within the gallbladder could be small stones or sludge. No acute inflammatory changes.  Pancreas: Unremarkable.  Spleen: Unremarkable.  Adrenals/Urinary Tract: Negative adrenals. No hydronephrosis or stone. Early excretion of contrast, reaching the right bladder, likely test bolus. 26 mm right renal cyst. Left renal sinus cysts. Unremarkable bladder.  Reproductive:Incidental calcified fundal fibroid measuring 17 mm.  Stomach/Bowel: Pneumoperitoneum consistent with bowel perforation. Accumulation of gas in the upper abdomen and a visible mucosa defect in the ventral gastric body suggest a gastric ulcer perforation. There is mild colonic diverticulosis distally but no surrounding pneumoperitoneum. Reactive ascites and omental fat edema. No obstruction. No appendicitis.  Vascular/Lymphatic: Aortic atherosclerosis without aneurysm or dissection. Moderate stenosis of the right renal artery proximally. No other visceral branch stenosis. No mass or adenopathy.  Musculoskeletal: Diffuse disc degeneration, status post L4-5 discectomy with posterior rod and pedicle screw fixation. Remote appearing T5, T6, T8 and T11 superior endplate fractures.  Critical Value/emergent results and pulmonary nodule were called by telephone at the time of interpretation on 10/16/2014 at 8:53 pm to Dr. Dalia Heading , who verbally acknowledged these results.  Review of the MIP images confirms the above findings.  IMPRESSION: 1. Bowel perforation with moderate pneumoperitoneum, strongly favor a gastric ulcer. 2. 11 mm right lower lobe pulmonary nodule, concerning for malignancy in this smoker. The nodule is at the limits of PET resolution. Could attempt PET or short followup chest CT (on the order of 3 months). 3. No acute aortic findings. 4. Moderate right renal artery stenosis. 5. Gallbladder sludge or small calculi.   Electronically Signed   By: Monte Fantasia M.D.   On: 10/16/2014 21:07   Ct Angio  Abd/pel W/ And/or W/o  10/16/2014   CLINICAL DATA:  Severe abdominal and chest pain  EXAM: CT ANGIOGRAPHY CHEST, ABDOMEN AND PELVIS  TECHNIQUE: Multidetector CT imaging through the chest, abdomen and pelvis was performed using the standard protocol during bolus administration of intravenous contrast. Multiplanar reconstructed images and MIPs were obtained and reviewed to evaluate the vascular anatomy.  CONTRAST:  100 cc Omnipaque 350 intravenous  COMPARISON:  None.  FINDINGS: CTA CHEST FINDINGS  THORACIC INLET/BODY WALL:  No acute abnormality.  MEDIASTINUM:  No aortic intramural hematoma or dissection. No aortic aneurysm. Mild atheromatous changes, greatest along the aortic arch. No cardiomegaly or pericardial effusion. No lymphadenopathy.  LUNG WINDOWS:  Bibasilar atelectasis. Lobulated pulmonary nodule in the right lower lobe, nearly touching the major fissure, measuring 11 mm (measured coronally).  OSSEOUS:  See below  Review of the MIP images confirms the above findings.  CTA ABDOMEN AND PELVIS FINDINGS  Abdominal wall: 3 small midline abdominal wall hernias containing fat. The upper hernia also contains pneumoperitoneum.  Hepatobiliary: Liver cysts, up to 32 mm in segment 4B. Possible hepatic steatosis.Layering high density within the gallbladder could be small stones or sludge. No acute inflammatory changes.  Pancreas: Unremarkable.  Spleen: Unremarkable.  Adrenals/Urinary Tract: Negative adrenals. No hydronephrosis or stone. Early excretion of contrast, reaching  the right bladder, likely test bolus. 26 mm right renal cyst. Left renal sinus cysts. Unremarkable bladder.  Reproductive:Incidental calcified fundal fibroid measuring 17 mm.  Stomach/Bowel: Pneumoperitoneum consistent with bowel perforation. Accumulation of gas in the upper abdomen and a visible mucosa defect in the ventral gastric body suggest a gastric ulcer perforation. There is mild colonic diverticulosis distally but no surrounding  pneumoperitoneum. Reactive ascites and omental fat edema. No obstruction. No appendicitis.  Vascular/Lymphatic: Aortic atherosclerosis without aneurysm or dissection. Moderate stenosis of the right renal artery proximally. No other visceral branch stenosis. No mass or adenopathy.  Musculoskeletal: Diffuse disc degeneration, status post L4-5 discectomy with posterior rod and pedicle screw fixation. Remote appearing T5, T6, T8 and T11 superior endplate fractures.  Critical Value/emergent results and pulmonary nodule were called by telephone at the time of interpretation on 10/16/2014 at 8:53 pm to Dr. Dalia Heading , who verbally acknowledged these results.  Review of the MIP images confirms the above findings.  IMPRESSION: 1. Bowel perforation with moderate pneumoperitoneum, strongly favor a gastric ulcer. 2. 11 mm right lower lobe pulmonary nodule, concerning for malignancy in this smoker. The nodule is at the limits of PET resolution. Could attempt PET or short followup chest CT (on the order of 3 months). 3. No acute aortic findings. 4. Moderate right renal artery stenosis. 5. Gallbladder sludge or small calculi.   Electronically Signed   By: Monte Fantasia M.D.   On: 10/16/2014 21:07    Anti-infectives: Anti-infectives    Start     Dose/Rate Route Frequency Ordered Stop   10/17/14 0600  piperacillin-tazobactam (ZOSYN) IVPB 3.375 g     3.375 g 12.5 mL/hr over 240 Minutes Intravenous Every 8 hours 10/17/14 0014     10/17/14 0130  vancomycin (VANCOCIN) IVPB 1000 mg/200 mL premix     1,000 mg 200 mL/hr over 60 Minutes Intravenous Every 24 hours 10/17/14 0124     10/16/14 2115  piperacillin-tazobactam (ZOSYN) IVPB 3.375 g     3.375 g 100 mL/hr over 30 Minutes Intravenous  Once 10/16/14 2114 10/16/14 2215   10/16/14 2100  piperacillin-tazobactam (ZOSYN) IVPB 4.5 g  Status:  Discontinued     4.5 g 200 mL/hr over 30 Minutes Intravenous  Once 10/16/14 2054 10/16/14 2111      Assessment/Plan: s/p  Procedure(s): EXPLORATORY LAPAROTOMY, REPAIR OF GASTRIC ULCER (N/A)  Vent per CCM, ok to wean from our POV Bowel rest, NG tube to LIWS Add diflucan for addl coverage; plan 5 days of abx coverage vte prophylaxis Lung nodule - outpt workup  Leighton Ruff. Redmond Pulling, MD, FACS General, Bariatric, & Minimally Invasive Surgery [facility], [place]   LOS: 1 day    Gayland Curry 10/17/2014

## 2014-10-17 NOTE — Progress Notes (Signed)
Pt. Switched back to full support per MD.

## 2014-10-17 NOTE — Procedures (Signed)
Extubation Procedure Note  Patient Details:   Name: Sally Douglas DOB: 1945/12/12 MRN: [id number]   Airway Documentation:     Evaluation  O2 sats: stable throughout Complications: No apparent complications Patient did tolerate procedure well. Bilateral Breath Sounds: Clear Suctioning: Airway Yes   Patient extubated to 2L nasal cannula per MD order.  Positive cuff leak noted.  No evidence of stridor.  Sats currently 98%.  Vitals are stable.  Patient able to speak post extubation.  No apparent complications.  Philomena Doheny 10/17/2014, 8:59 AM

## 2014-10-17 NOTE — Progress Notes (Signed)
PULMONARY  / [place]   Name: Sally Douglas MRN: [id number] DOB: 04-30-1945    ADMISSION DATE:  10/16/2014 CONSULTATION DATE: October 17, 2014  REQUESTING CLINICIAN: Md Ccs, MD PRIMARY SERVICE: Surgery  CHIEF COMPLAINT:  Abdominal Pain  BRIEF PATIENT DESCRIPTION: 69 y/o woman with sudden onset abdominal pain found to have perforated gastric ulcer.   SIGNIFICANT EVENTS / STUDIES:  9/18 CT of Chest/Abd/Pelvis ->> Free peritoneal air, 11 mm lung nodule. 9/19 Ex Lap w/ repair of gastric perforation  LINES / TUBES: 7 mm ETT 10/16/14 JP drain in abdomen 10/16/14 Foley 10/15/17  CULTURES: Blood 9/18 Intra-op peritoneal fluid 9/18  ANTIBIOTICS: Pip-tazo 9/19 >>  HISTORY OF PRESENT ILLNESS:   Ms. Mruk is a 69 y/o woman with a history of ventral hernia s/p repair as well as tobacco abuse who presented with sudden onset abdominal pain and was found to have a perforated gastric ulcer. Incidentally, an 11 cm nodule was seen in her RLL without mediastinal adenoapathy. She underwent ex-lap with washout and repair on 9/18. Biopsies were taken. Following the procedure, she had low tidal volumes on pressure support, and she was taken to the ICU intubated. PCCM was consulted for evaluation of the nodule and ventilator management.  SUBJECTIVE:  Sedated this am, has had some vent dyssynchrony   VITAL SIGNS: Temp:  [97.6 F (36.4 C)-98.5 F (36.9 C)] 98.5 F (36.9 C) (09/19 0425) Pulse Rate:  [62-91] 74 (09/19 0700) Resp:  [7-29] 10 (09/19 0700) BP: (102-197)/(60-105) 106/62 mmHg (09/19 0700) SpO2:  [91 %-100 %] 100 % (09/19 0700) FiO2 (%):  [40 %] 40 % (09/19 0700) Weight:  [74.844 kg (165 lb)] 74.844 kg (165 lb) (09/18 1838) HEMODYNAMICS:   VENTILATOR SETTINGS: Vent Mode:  [-] PRVC FiO2 (%):  [40 %] 40 % Set Rate:  [14 bmp-18 bmp] 18 bmp Vt Set:  [300 mL] 300 mL PEEP:  [5 cmH20] 5 cmH20 Pressure Support:  [10 cmH20] 10 cmH20 Plateau Pressure:  [15  cmH20-23 cmH20] 23 cmH20 INTAKE / OUTPUT: Intake/Output      09/18 0701 - 09/19 0700 09/19 0701 - 09/20 0700   I.V. (mL/kg) 2046.5 (27.3)    IV Piggyback 462.5    Total Intake(mL/kg) 2509 (33.5)    Urine (mL/kg/hr) 840    Drains 140    Total Output 980     Net +1529            PHYSICAL EXAMINATION: General:  Obese woman intubated and sedated. Neuro:  Sedated, not following commands. HEENT:  MMM Neck: no LAD Cardiovascular:  No LAD Lungs:  CTAB. Abdomen:  Dressings in place. JP drain in place. Musculoskeletal:  No joint swelling. Skin:  No rashes.  LABS:  CBC  Recent Labs Lab 10/16/14 2013 10/17/14 0212  WBC 24.9* 20.6*  HGB 14.5 12.7  HCT 44.0 39.8  PLT 378 292   Coag's No results for input(s): APTT, INR in the last 168 hours. BMET  Recent Labs Lab 10/16/14 2013 10/16/14 2019 10/17/14 0212  NA 137  --  140  K 3.5  --  3.7  CL 101  --  108  CO2 22  --  23  BUN 32*  --  28*  CREATININE 1.16* 1.10* 1.27*  GLUCOSE 148*  --  182*   Electrolytes  Recent Labs Lab 10/16/14 2013 10/17/14 0212  CALCIUM 9.0 8.0*   Sepsis Markers  Recent Labs Lab 10/16/14 2020  LATICACIDVEN 1.59   ABG  Recent Labs  Lab 10/17/14 0218 10/17/14 0451  PHART 7.204* 7.265*  PCO2ART 57.6* 50.1*  PO2ART 96.0 109.0*   Liver Enzymes  Recent Labs Lab 10/16/14 2013  AST 22  ALT 33  ALKPHOS 71  BILITOT 0.7  ALBUMIN 3.6   Cardiac Enzymes No results for input(s): TROPONINI, PROBNP in the last 168 hours. Glucose  Recent Labs Lab 10/17/14 0044 10/17/14 0426  GLUCAP 166* 201*    Imaging Dg Chest 2 View  10/16/2014   CLINICAL DATA:  Centralized chest pain X 1 DAY, chest pressure X 1 DAY, cough X 1 day, hx of htn  EXAM: CHEST  2 VIEW  COMPARISON:  10/27/2012  FINDINGS: Lung volumes are relatively low. There is bibasilar atelectasis with some atelectasis along the minor fissure. No convincing pneumonia and no pulmonary edema.  No pleural effusion or pneumothorax.   Cardiac silhouette is borderline enlarged. No mediastinal or hilar masses or convincing adenopathy.  Bony thorax is demineralized.  IMPRESSION: 1. Low lung volumes and lung base atelectasis and atelectasis along the minor fissure. No convincing pneumonia. No pulmonary edema.   Electronically Signed   By: Lajean Manes M.D.   On: 10/16/2014 19:44   Dg Chest Port 1 View  10/17/2014   CLINICAL DATA:  Endotracheal tube placement  EXAM: PORTABLE CHEST - 1 VIEW  COMPARISON:  10/16/2014  FINDINGS: Endotracheal tube placed with tip measuring 3.7 cm above the carinal. Enteric tube tip is off the field of view but below the left hemidiaphragm. Linear atelectasis in the lung bases with fluid in the minor fissure on the right. No blunting of costophrenic angles. No pneumothorax. Mild cardiac enlargement.  IMPRESSION: Appliances appear in satisfactory location. Atelectasis in the lung bases with fluid in the right minor fissure.   Electronically Signed   By: Lucienne Capers M.D.   On: 10/17/2014 02:26   Ct Angio Chest Aorta W/cm &/or Wo/cm  10/16/2014   CLINICAL DATA:  Severe abdominal and chest pain  EXAM: CT ANGIOGRAPHY CHEST, ABDOMEN AND PELVIS  TECHNIQUE: Multidetector CT imaging through the chest, abdomen and pelvis was performed using the standard protocol during bolus administration of intravenous contrast. Multiplanar reconstructed images and MIPs were obtained and reviewed to evaluate the vascular anatomy.  CONTRAST:  100 cc Omnipaque 350 intravenous  COMPARISON:  None.  FINDINGS: CTA CHEST FINDINGS  THORACIC INLET/BODY WALL:  No acute abnormality.  MEDIASTINUM:  No aortic intramural hematoma or dissection. No aortic aneurysm. Mild atheromatous changes, greatest along the aortic arch. No cardiomegaly or pericardial effusion. No lymphadenopathy.  LUNG WINDOWS:  Bibasilar atelectasis. Lobulated pulmonary nodule in the right lower lobe, nearly touching the major fissure, measuring 11 mm (measured coronally).   OSSEOUS:  See below  Review of the MIP images confirms the above findings.  CTA ABDOMEN AND PELVIS FINDINGS  Abdominal wall: 3 small midline abdominal wall hernias containing fat. The upper hernia also contains pneumoperitoneum.  Hepatobiliary: Liver cysts, up to 32 mm in segment 4B. Possible hepatic steatosis.Layering high density within the gallbladder could be small stones or sludge. No acute inflammatory changes.  Pancreas: Unremarkable.  Spleen: Unremarkable.  Adrenals/Urinary Tract: Negative adrenals. No hydronephrosis or stone. Early excretion of contrast, reaching the right bladder, likely test bolus. 26 mm right renal cyst. Left renal sinus cysts. Unremarkable bladder.  Reproductive:Incidental calcified fundal fibroid measuring 17 mm.  Stomach/Bowel: Pneumoperitoneum consistent with bowel perforation. Accumulation of gas in the upper abdomen and a visible mucosa defect in the ventral gastric body suggest a gastric ulcer  perforation. There is mild colonic diverticulosis distally but no surrounding pneumoperitoneum. Reactive ascites and omental fat edema. No obstruction. No appendicitis.  Vascular/Lymphatic: Aortic atherosclerosis without aneurysm or dissection. Moderate stenosis of the right renal artery proximally. No other visceral branch stenosis. No mass or adenopathy.  Musculoskeletal: Diffuse disc degeneration, status post L4-5 discectomy with posterior rod and pedicle screw fixation. Remote appearing T5, T6, T8 and T11 superior endplate fractures.  Critical Value/emergent results and pulmonary nodule were called by telephone at the time of interpretation on 10/16/2014 at 8:53 pm to Dr. Dalia Heading , who verbally acknowledged these results.  Review of the MIP images confirms the above findings.  IMPRESSION: 1. Bowel perforation with moderate pneumoperitoneum, strongly favor a gastric ulcer. 2. 11 mm right lower lobe pulmonary nodule, concerning for malignancy in this smoker. The nodule is at the  limits of PET resolution. Could attempt PET or short followup chest CT (on the order of 3 months). 3. No acute aortic findings. 4. Moderate right renal artery stenosis. 5. Gallbladder sludge or small calculi.   Electronically Signed   By: Monte Fantasia M.D.   On: 10/16/2014 21:07   Ct Angio Abd/pel W/ And/or W/o  10/16/2014   CLINICAL DATA:  Severe abdominal and chest pain  EXAM: CT ANGIOGRAPHY CHEST, ABDOMEN AND PELVIS  TECHNIQUE: Multidetector CT imaging through the chest, abdomen and pelvis was performed using the standard protocol during bolus administration of intravenous contrast. Multiplanar reconstructed images and MIPs were obtained and reviewed to evaluate the vascular anatomy.  CONTRAST:  100 cc Omnipaque 350 intravenous  COMPARISON:  None.  FINDINGS: CTA CHEST FINDINGS  THORACIC INLET/BODY WALL:  No acute abnormality.  MEDIASTINUM:  No aortic intramural hematoma or dissection. No aortic aneurysm. Mild atheromatous changes, greatest along the aortic arch. No cardiomegaly or pericardial effusion. No lymphadenopathy.  LUNG WINDOWS:  Bibasilar atelectasis. Lobulated pulmonary nodule in the right lower lobe, nearly touching the major fissure, measuring 11 mm (measured coronally).  OSSEOUS:  See below  Review of the MIP images confirms the above findings.  CTA ABDOMEN AND PELVIS FINDINGS  Abdominal wall: 3 small midline abdominal wall hernias containing fat. The upper hernia also contains pneumoperitoneum.  Hepatobiliary: Liver cysts, up to 32 mm in segment 4B. Possible hepatic steatosis.Layering high density within the gallbladder could be small stones or sludge. No acute inflammatory changes.  Pancreas: Unremarkable.  Spleen: Unremarkable.  Adrenals/Urinary Tract: Negative adrenals. No hydronephrosis or stone. Early excretion of contrast, reaching the right bladder, likely test bolus. 26 mm right renal cyst. Left renal sinus cysts. Unremarkable bladder.  Reproductive:Incidental calcified fundal fibroid  measuring 17 mm.  Stomach/Bowel: Pneumoperitoneum consistent with bowel perforation. Accumulation of gas in the upper abdomen and a visible mucosa defect in the ventral gastric body suggest a gastric ulcer perforation. There is mild colonic diverticulosis distally but no surrounding pneumoperitoneum. Reactive ascites and omental fat edema. No obstruction. No appendicitis.  Vascular/Lymphatic: Aortic atherosclerosis without aneurysm or dissection. Moderate stenosis of the right renal artery proximally. No other visceral branch stenosis. No mass or adenopathy.  Musculoskeletal: Diffuse disc degeneration, status post L4-5 discectomy with posterior rod and pedicle screw fixation. Remote appearing T5, T6, T8 and T11 superior endplate fractures.  Critical Value/emergent results and pulmonary nodule were called by telephone at the time of interpretation on 10/16/2014 at 8:53 pm to Dr. Dalia Heading , who verbally acknowledged these results.  Review of the MIP images confirms the above findings.  IMPRESSION: 1. Bowel perforation with moderate pneumoperitoneum,  strongly favor a gastric ulcer. 2. 11 mm right lower lobe pulmonary nodule, concerning for malignancy in this smoker. The nodule is at the limits of PET resolution. Could attempt PET or short followup chest CT (on the order of 3 months). 3. No acute aortic findings. 4. Moderate right renal artery stenosis. 5. Gallbladder sludge or small calculi.   Electronically Signed   By: Monte Fantasia M.D.   On: 10/16/2014 21:07    EKG: NSR CXR: Fluid in R great fissure.  Chest CT shows lung nodule.  ASSESSMENT / PLAN:  PULMONARY A: Need for Mechanical Ventilation  RLL Lung nodule, possible malignancy P:   - assess for SBT this am once sedation lifted - Will need evaluation of RLL nodule. Given >1 cm size, recommend outpt  PET and consideration for biopsy, possible primary resection if no evidence of spread on PET. This cannot be accurately performed, of  course, until she recovers from her surgery and there is reduced inflammation in her abdomen. Recommend PET/CT in 4 weeks, to be done as outpatient.  CARDIOVASCULAR A: No acute issues. P:   ---  RENAL A: Mild renal insfufficency May be chronic.  P: Will need to be adequately hydrated given contrast load and surgery. Follow BMP  GASTROINTESTINAL A: Perforated gastric ulcer, s/p ex lap P:   Defer to primary team. abx as below  HEMATOLOGIC A: Possible malignancy, RLL nodule P:   As above, will follow as outpatient.  INFECTIOUS A: Perforated gastric ucler Peritonitis  P:   Peritoneal fluid 9/18 >>  Blood 9/19 >>   Zosyn 9/18 >>  vanco 9/18 >>   Agree with gram negative and anaerobic coverage, MRSA coverage until GPC's are speciated  ENDOCRINE A: Hyperglycemia P:   SSI  NEUROLOGIC A: Need for sedation P:   Agree with Fentanyl and propofol infusions, weaning down now  TODAY'S SUMMARY: 69 y/o woman with perforated gastric ulcer and RLL nodule concerning for possible malignancy. Goal SBT and extubation 9/19 I have personally obtained a history, examined the patient, evaluated laboratory and imaging results, formulated the assessment and plan and placed orders.  CRITICAL CARE: The patient is critically ill with multiple organ systems failure and requires high complexity decision making for assessment and support, frequent evaluation and titration of therapies, application of advanced monitoring technologies and extensive interpretation of multiple databases. Critical Care Time devoted to patient care services described in this note is 40 minutes.   Baltazar Apo, MD, PhD 10/17/2014, 8:03 AM [facility] [phone] or if no answer [phone]

## 2014-10-17 NOTE — Transfer of Care (Signed)
Immediate Anesthesia Transfer of Care Note  Patient: Sally Douglas  Procedure(s) Performed: Procedure(s): EXPLORATORY LAPAROTOMY, REPAIR OF GASTRIC ULCER (N/A)  Patient Location: ICU  Anesthesia Type:General  Level of Consciousness: Patient remains intubated per anesthesia plan  Airway & Oxygen Therapy: Patient remains intubated per anesthesia plan and Patient placed on Ventilator (see vital sign flow sheet for setting)  Post-op Assessment: Report given to RN and Post -op Vital signs reviewed and stable  Post vital signs: Reviewed and stable  Last Vitals:  Filed Vitals:   10/16/14 2145  BP: 159/86  Pulse: 87  Temp:   Resp: 22    Complications: No apparent anesthesia complications

## 2014-10-17 NOTE — Progress Notes (Signed)
eLink Physician-Brief Progress Note Patient Name: Sally Douglas DOB: 1946-01-22 MRN: [id number]   Date of Service  10/17/2014  HPI/Events of Note   Recent Labs Lab 10/17/14 0218  PHART 7.204*  PCO2ART 57.6*  PO2ART 96.0  HCO3 22.8  TCO2 25  O2SAT 96.0    resp acidos on PSV Looks tired on camera exam  eICU Interventions  Change PSV to PRVC abg oin 1h     Intervention Category Major Interventions: Acid-Base disturbance - evaluation and management  RAMASWAMY,MURALI 10/17/2014, 2:23 AM

## 2014-10-18 ENCOUNTER — Inpatient Hospital Stay (HOSPITAL_COMMUNITY): Payer: Medicare Other

## 2014-10-18 DIAGNOSIS — Z9289 Personal history of other medical treatment: Secondary | ICD-10-CM | POA: Insufficient documentation

## 2014-10-18 DIAGNOSIS — Z789 Other specified health status: Secondary | ICD-10-CM

## 2014-10-18 DIAGNOSIS — K255 Chronic or unspecified gastric ulcer with perforation: Secondary | ICD-10-CM | POA: Diagnosis not present

## 2014-10-18 DIAGNOSIS — Z978 Presence of other specified devices: Secondary | ICD-10-CM | POA: Insufficient documentation

## 2014-10-18 LAB — BASIC METABOLIC PANEL
ANION GAP: 8 (ref 5–15)
ANION GAP: 9 (ref 5–15)
BUN: 23 mg/dL — ABNORMAL HIGH (ref 6–20)
BUN: 20 mg/dL (ref 6–20)
CALCIUM: 8.5 mg/dL — AB (ref 8.9–10.3)
CO2: 22 mmol/L (ref 22–32)
CO2: 24 mmol/L (ref 22–32)
CREATININE: 1.22 mg/dL — AB (ref 0.44–1.00)
Calcium: 8.5 mg/dL — ABNORMAL LOW (ref 8.9–10.3)
Chloride: 105 mmol/L (ref 101–111)
Chloride: 101 mmol/L (ref 101–111)
Creatinine, Ser: 1.13 mg/dL — ABNORMAL HIGH (ref 0.44–1.00)
GFR, EST AFRICAN AMERICAN: 51 mL/min — AB (ref >60)
GFR, EST AFRICAN AMERICAN: 56 mL/min — AB (ref >60)
GFR, EST NON AFRICAN AMERICAN: 44 mL/min — AB (ref >60)
GFR, EST NON AFRICAN AMERICAN: 48 mL/min — AB (ref >60)
Glucose, Bld: 189 mg/dL — ABNORMAL HIGH (ref 65–99)
Glucose, Bld: 127 mg/dL — ABNORMAL HIGH (ref 65–99)
POTASSIUM: 4 mmol/L (ref 3.5–5.1)
Potassium: 4.3 mmol/L (ref 3.5–5.1)
SODIUM: 135 mmol/L (ref 135–145)
SODIUM: 134 mmol/L — AB (ref 135–145)

## 2014-10-18 LAB — GLUCOSE, CAPILLARY
GLUCOSE-CAPILLARY: 109 mg/dL — AB (ref 65–99)
GLUCOSE-CAPILLARY: 126 mg/dL — AB (ref 65–99)
GLUCOSE-CAPILLARY: 150 mg/dL — AB (ref 65–99)
GLUCOSE-CAPILLARY: 173 mg/dL — AB (ref 65–99)
GLUCOSE-CAPILLARY: 92 mg/dL (ref 65–99)
Glucose-Capillary: 100 mg/dL — ABNORMAL HIGH (ref 65–99)

## 2014-10-18 LAB — CBC
HEMATOCRIT: 35.2 % — AB (ref 36.0–46.0)
Hemoglobin: 11 g/dL — ABNORMAL LOW (ref 12.0–15.0)
MCH: 29.3 pg (ref 26.0–34.0)
MCHC: 31.3 g/dL (ref 30.0–36.0)
MCV: 93.9 fL (ref 78.0–100.0)
PLATELETS: 241 10*3/uL (ref 150–400)
RBC: 3.75 MIL/uL — ABNORMAL LOW (ref 3.87–5.11)
RDW: 15.9 % — AB (ref 11.5–15.5)
WBC: 19.4 10*3/uL — AB (ref 4.0–10.5)

## 2014-10-18 LAB — BODY FLUID CULTURE

## 2014-10-18 LAB — TROPONIN I
Troponin I: 0.28 ng/mL — ABNORMAL HIGH (ref <0.031)
Troponin I: 0.28 ng/mL — ABNORMAL HIGH (ref <0.031)
Troponin I: 0.33 ng/mL — ABNORMAL HIGH (ref <0.031)

## 2014-10-18 LAB — MAGNESIUM: Magnesium: 1.8 mg/dL (ref 1.7–2.4)

## 2014-10-18 LAB — PHOSPHORUS: PHOSPHORUS: 4.2 mg/dL (ref 2.5–4.6)

## 2014-10-18 MED ORDER — HYDRALAZINE HCL 20 MG/ML IJ SOLN
10.0000 mg | Freq: Four times a day (QID) | INTRAMUSCULAR | Status: DC | PRN
  Administered 2014-10-19: 10 mg via INTRAVENOUS
  Filled 2014-10-18: qty 1

## 2014-10-18 MED ORDER — PANTOPRAZOLE SODIUM 40 MG IV SOLR
40.0000 mg | Freq: Two times a day (BID) | INTRAVENOUS | Status: DC
  Administered 2014-10-18 – 2014-10-22 (×9): 40 mg via INTRAVENOUS
  Filled 2014-10-18 (×9): qty 40

## 2014-10-18 MED ORDER — SODIUM CHLORIDE 0.9 % IV SOLN
INTRAVENOUS | Status: DC
  Administered 2014-10-18: 100 mL/h via INTRAVENOUS
  Administered 2014-10-19 – 2014-10-21 (×3): via INTRAVENOUS

## 2014-10-18 MED ORDER — MAGNESIUM SULFATE 2 GM/50ML IV SOLN
2.0000 g | Freq: Once | INTRAVENOUS | Status: AC
  Administered 2014-10-18: 2 g via INTRAVENOUS
  Filled 2014-10-18: qty 50

## 2014-10-18 MED ORDER — SODIUM CHLORIDE 0.9 % IV SOLN
INTRAVENOUS | Status: DC
  Filled 2014-10-18: qty 1000

## 2014-10-18 MED ORDER — IOHEXOL 350 MG/ML SOLN
100.0000 mL | Freq: Once | INTRAVENOUS | Status: AC | PRN
  Administered 2014-10-16: 100 mL via INTRAVENOUS

## 2014-10-18 NOTE — Progress Notes (Signed)
[place] Progress Note Patient Name: Sally Douglas DOB: [birth year] MRN: [id number]   Date of Service  10/18/2014  HPI/Events of Note  Mg++ =1.8.  eICU Interventions  Replete Mg++.     Intervention Category Intermediate Interventions: Electrolyte abnormality - evaluation and management  Sommer,Steven Eugene 10/18/2014, 6:32 PM

## 2014-10-18 NOTE — Progress Notes (Signed)
Gave update to pt and husband Discussed wound care and bowel recovery and lung nodule  Sally Douglas. Redmond Pulling, MD, FACS General, Bariatric, & Minimally Invasive Surgery [facility], [place]

## 2014-10-18 NOTE — Evaluation (Signed)
Physical Therapy Evaluation Patient Details Name: Sally Douglas MRN: [id number] DOB: [birth year] Today's Date: 10/18/2014   History of Present Illness  Pt is a 69 y/o female who presented to the ED with sudden onset of abdominal pain and was found to have a perforated gastric ulcer. She underwent an exploatory laparotomy with repair of gastric perforation on 10/17/14.   Clinical Impression  Pt admitted with above diagnosis. Pt currently with functional limitations due to the deficits listed below (see PT Problem List). Per MD proceeded with PT eval despite medical issues listed in previous note. Pt was able to perform transfers and minimal ambulation (bed to chair) with +2 min assist for balance/safety with lines. Anticipate pt will progress well as pain resolves. As a lung nodule was also found on imaging, may want to monitor for DOE and O2 sats next session. Pt will benefit from skilled PT to increase their independence and safety with mobility to allow discharge to the venue listed below.       Follow Up Recommendations Home health PT;Supervision for mobility/OOB    Equipment Recommendations  Other (comment) (Tub bench)    Recommendations for Other Services       Precautions / Restrictions Precautions Precautions: Fall Restrictions Weight Bearing Restrictions: No      Mobility  Bed Mobility Overal bed mobility: Needs Assistance Bed Mobility: Supine to Sit     Supine to sit: Min guard     General bed mobility comments: Hands-on guarding as pt transitioned to EOB. Increased time to scoot out.  Transfers Overall transfer level: Needs assistance Equipment used: 2 person hand held assist Transfers: Sit to/from [facility] Sit to Stand: Min assist;+2 physical assistance Stand pivot transfers: Min assist;+2 physical assistance       General transfer comment: Pt was able to power-up to full standing position with +2 assist for steadying balance/support and  management of lines. Pt moving slow and was able to take a few steps around to the recliner.   Ambulation/Gait             General Gait Details: Deferred due to fatigue  Stairs            Wheelchair Mobility    Modified Rankin (Stroke Patients Only)       Balance Overall balance assessment: Needs assistance Sitting-balance support: Feet supported;No upper extremity supported Sitting balance-Leahy Scale: Good     Standing balance support: Bilateral upper extremity supported Standing balance-Leahy Scale: Poor                               Pertinent Vitals/Pain Pain Assessment: Faces Faces Pain Scale: Hurts little more Pain Location: Abdomen Pain Descriptors / Indicators: Operative site guarding;Grimacing Pain Intervention(s): Limited activity within patient's tolerance;Monitored during session;Repositioned;PCA encouraged    Home Living Family/patient expects to be discharged to:: Private residence Living Arrangements: Spouse/significant other Available Help at Discharge: Family;Available PRN/intermittently Type of Home: House Home Access: Level entry     Home Layout: One level Home Equipment: Walker - 2 wheels;Shower seat Sales executive chair does not fit in the tub per husband)      Prior Function Level of Independence: Independent         Comments: Likes to garden     Hand Dominance   Dominant Hand: Right    Extremity/Trunk Assessment   Upper Extremity Assessment: Defer to OT evaluation  Lower Extremity Assessment: Generalized weakness (Acutely weak from recent events)      Cervical / Trunk Assessment: Normal  Communication   Communication: No difficulties  Cognition Arousal/Alertness: Awake/alert Behavior During Therapy: WFL for tasks assessed/performed Overall Cognitive Status: Within Functional Limits for tasks assessed                      General Comments      Exercises        Assessment/Plan     PT Assessment Patient needs continued PT services  PT Diagnosis Difficulty walking;Acute pain   PT Problem List Decreased strength;Decreased range of motion;Decreased activity tolerance;Decreased balance;Decreased mobility;Decreased knowledge of use of DME;Decreased safety awareness;Decreased knowledge of precautions;Pain  PT Treatment Interventions DME instruction;Gait training;Stair training;Functional mobility training;Therapeutic activities;Therapeutic exercise;Neuromuscular re-education;Patient/family education   PT Goals (Current goals can be found in the Care Plan section) Acute Rehab PT Goals PT Goal Formulation: With patient/family Time For Goal Achievement: 10/25/14 Potential to Achieve Goals: Good    Frequency Min 3X/week   Barriers to discharge Decreased caregiver support Husband works 2 jobs and states he leaves at [employer] and returns at [place] During Treatment: Gait belt;Oxygen Activity Tolerance: Patient limited by fatigue Patient left: in chair;with call bell/phone within reach;with family/visitor present Nurse Communication: Mobility status         Time: 1329-1400 PT Time Calculation (min) (ACUTE ONLY): 31 min   Charges:   PT Evaluation $Initial PT Evaluation Tier I: 1 Procedure PT Treatments $Therapeutic Activity: 8-22 mins   PT G Codes:        Rolinda Roan 11/11/14, 2:27 PM   Rolinda Roan, PT, DPT Acute Rehabilitation Services Pager: [phone]

## 2014-10-18 NOTE — Progress Notes (Signed)
Patient ID: Sally Douglas, female   DOB: Aug 16, 1945, 69 y.o.   MRN: [id number] 2 Days Post-Op  Subjective: Pt's pain is better on PCA, but still sore to move around.  Denies chest pain.  Denies SOB  Objective: Vital signs in last 24 hours: Temp:  [97.9 F (36.6 C)-98.3 F (36.8 C)] 97.9 F (36.6 C) (09/20 0344) Pulse Rate:  [61-103] 70 (09/20 0600) Resp:  [8-23] 16 (09/20 0600) BP: (105-163)/(66-90) 142/69 mmHg (09/20 0600) SpO2:  [92 %-100 %] 95 % (09/20 0600) FiO2 (%):  [40 %] 40 % (09/19 0830)    Intake/Output from previous day: 09/19 0701 - 09/20 0700 In: 2973.3 [I.V.:2423.3; IV Piggyback:550] Out: 3748 [OLMBE:6754; Drains:40] Intake/Output this shift:    PE: Abd: soft, appropriately tender, hypoactive BS, obese, incision is clean and wound is packed.  JP drain with serosang output Heart: regular, few ectopic beats. (monitor shows peaked T waves in lead III, switched to lead II with no evidence of peaking) Lungs: CTAB, but some use of accessory muscle to breath at times.  On 2L O2 sating low to mid 90s  Lab Results:   Recent Labs  10/17/14 0212 10/18/14 0310  WBC 20.6* 19.4*  HGB 12.7 11.0*  HCT 39.8 35.2*  PLT 292 241   BMET  Recent Labs  10/17/14 0212 10/18/14 0310  NA 140 135  K 3.7 4.3  CL 108 105  CO2 23 22  GLUCOSE 182* 189*  BUN 28* 23*  CREATININE 1.27* 1.22*  CALCIUM 8.0* 8.5*   PT/INR No results for input(s): LABPROT, INR in the last 72 hours. CMP     Component Value Date/Time   NA 135 10/18/2014 0310   K 4.3 10/18/2014 0310   CL 105 10/18/2014 0310   CO2 22 10/18/2014 0310   GLUCOSE 189* 10/18/2014 0310   BUN 23* 10/18/2014 0310   CREATININE 1.22* 10/18/2014 0310   CALCIUM 8.5* 10/18/2014 0310   PROT 7.0 10/16/2014 2013   ALBUMIN 3.6 10/16/2014 2013   AST 22 10/16/2014 2013   ALT 33 10/16/2014 2013   ALKPHOS 71 10/16/2014 2013   BILITOT 0.7 10/16/2014 2013   GFRNONAA 44* 10/18/2014 0310   GFRAA 51* 10/18/2014 0310   Lipase      Component Value Date/Time   LIPASE 31 10/16/2014 2013       Studies/Results: Dg Chest 2 View  10/16/2014   CLINICAL DATA:  Centralized chest pain X 1 DAY, chest pressure X 1 DAY, cough X 1 day, hx of htn  EXAM: CHEST  2 VIEW  COMPARISON:  10/27/2012  FINDINGS: Lung volumes are relatively low. There is bibasilar atelectasis with some atelectasis along the minor fissure. No convincing pneumonia and no pulmonary edema.  No pleural effusion or pneumothorax.  Cardiac silhouette is borderline enlarged. No mediastinal or hilar masses or convincing adenopathy.  Bony thorax is demineralized.  IMPRESSION: 1. Low lung volumes and lung base atelectasis and atelectasis along the minor fissure. No convincing pneumonia. No pulmonary edema.   Electronically Signed   By: Lajean Manes M.D.   On: 10/16/2014 19:44   Dg Chest Port 1 View  10/18/2014   CLINICAL DATA:  Patient with acute respiratory failure. Shortness of breath.  EXAM: PORTABLE CHEST - 1 VIEW  COMPARISON:  Chest radiograph 10/17/2014  FINDINGS: Enteric tube courses inferior to the diaphragm. Multiple monitoring leads overlie the patient. Interval extubation. Stable enlarged cardiac mediastinal contours. Elevation of the right hemidiaphragm. Unchanged bilateral mid and lower lung heterogeneous  pulmonary opacities. Probable small bilateral pleural effusions.  IMPRESSION: Interval extubation.  Small effusions and bibasilar atelectasis.   Electronically Signed   By: Lovey Newcomer M.D.   On: 10/18/2014 07:08   Dg Chest Port 1 View  10/17/2014   CLINICAL DATA:  Endotracheal tube placement  EXAM: PORTABLE CHEST - 1 VIEW  COMPARISON:  10/16/2014  FINDINGS: Endotracheal tube placed with tip measuring 3.7 cm above the carinal. Enteric tube tip is off the field of view but below the left hemidiaphragm. Linear atelectasis in the lung bases with fluid in the minor fissure on the right. No blunting of costophrenic angles. No pneumothorax. Mild cardiac enlargement.   IMPRESSION: Appliances appear in satisfactory location. Atelectasis in the lung bases with fluid in the right minor fissure.   Electronically Signed   By: Lucienne Capers M.D.   On: 10/17/2014 02:26   Ct Angio Chest Aorta W/cm &/or Wo/cm  10/16/2014   CLINICAL DATA:  Severe abdominal and chest pain  EXAM: CT ANGIOGRAPHY CHEST, ABDOMEN AND PELVIS  TECHNIQUE: Multidetector CT imaging through the chest, abdomen and pelvis was performed using the standard protocol during bolus administration of intravenous contrast. Multiplanar reconstructed images and MIPs were obtained and reviewed to evaluate the vascular anatomy.  CONTRAST:  100 cc Omnipaque 350 intravenous  COMPARISON:  None.  FINDINGS: CTA CHEST FINDINGS  THORACIC INLET/BODY WALL:  No acute abnormality.  MEDIASTINUM:  No aortic intramural hematoma or dissection. No aortic aneurysm. Mild atheromatous changes, greatest along the aortic arch. No cardiomegaly or pericardial effusion. No lymphadenopathy.  LUNG WINDOWS:  Bibasilar atelectasis. Lobulated pulmonary nodule in the right lower lobe, nearly touching the major fissure, measuring 11 mm (measured coronally).  OSSEOUS:  See below  Review of the MIP images confirms the above findings.  CTA ABDOMEN AND PELVIS FINDINGS  Abdominal wall: 3 small midline abdominal wall hernias containing fat. The upper hernia also contains pneumoperitoneum.  Hepatobiliary: Liver cysts, up to 32 mm in segment 4B. Possible hepatic steatosis.Layering high density within the gallbladder could be small stones or sludge. No acute inflammatory changes.  Pancreas: Unremarkable.  Spleen: Unremarkable.  Adrenals/Urinary Tract: Negative adrenals. No hydronephrosis or stone. Early excretion of contrast, reaching the right bladder, likely test bolus. 26 mm right renal cyst. Left renal sinus cysts. Unremarkable bladder.  Reproductive:Incidental calcified fundal fibroid measuring 17 mm.  Stomach/Bowel: Pneumoperitoneum consistent with bowel  perforation. Accumulation of gas in the upper abdomen and a visible mucosa defect in the ventral gastric body suggest a gastric ulcer perforation. There is mild colonic diverticulosis distally but no surrounding pneumoperitoneum. Reactive ascites and omental fat edema. No obstruction. No appendicitis.  Vascular/Lymphatic: Aortic atherosclerosis without aneurysm or dissection. Moderate stenosis of the right renal artery proximally. No other visceral branch stenosis. No mass or adenopathy.  Musculoskeletal: Diffuse disc degeneration, status post L4-5 discectomy with posterior rod and pedicle screw fixation. Remote appearing T5, T6, T8 and T11 superior endplate fractures.  Critical Value/emergent results and pulmonary nodule were called by telephone at the time of interpretation on 10/16/2014 at 8:53 pm to Dr. Dalia Heading , who verbally acknowledged these results.  Review of the MIP images confirms the above findings.  IMPRESSION: 1. Bowel perforation with moderate pneumoperitoneum, strongly favor a gastric ulcer. 2. 11 mm right lower lobe pulmonary nodule, concerning for malignancy in this smoker. The nodule is at the limits of PET resolution. Could attempt PET or short followup chest CT (on the order of 3 months). 3. No acute aortic findings.  4. Moderate right renal artery stenosis. 5. Gallbladder sludge or small calculi.   Electronically Signed   By: Monte Fantasia M.D.   On: 10/16/2014 21:07   Ct Angio Abd/pel W/ And/or W/o  10/16/2014   CLINICAL DATA:  Severe abdominal and chest pain  EXAM: CT ANGIOGRAPHY CHEST, ABDOMEN AND PELVIS  TECHNIQUE: Multidetector CT imaging through the chest, abdomen and pelvis was performed using the standard protocol during bolus administration of intravenous contrast. Multiplanar reconstructed images and MIPs were obtained and reviewed to evaluate the vascular anatomy.  CONTRAST:  100 cc Omnipaque 350 intravenous  COMPARISON:  None.  FINDINGS: CTA CHEST FINDINGS  THORACIC  INLET/BODY WALL:  No acute abnormality.  MEDIASTINUM:  No aortic intramural hematoma or dissection. No aortic aneurysm. Mild atheromatous changes, greatest along the aortic arch. No cardiomegaly or pericardial effusion. No lymphadenopathy.  LUNG WINDOWS:  Bibasilar atelectasis. Lobulated pulmonary nodule in the right lower lobe, nearly touching the major fissure, measuring 11 mm (measured coronally).  OSSEOUS:  See below  Review of the MIP images confirms the above findings.  CTA ABDOMEN AND PELVIS FINDINGS  Abdominal wall: 3 small midline abdominal wall hernias containing fat. The upper hernia also contains pneumoperitoneum.  Hepatobiliary: Liver cysts, up to 32 mm in segment 4B. Possible hepatic steatosis.Layering high density within the gallbladder could be small stones or sludge. No acute inflammatory changes.  Pancreas: Unremarkable.  Spleen: Unremarkable.  Adrenals/Urinary Tract: Negative adrenals. No hydronephrosis or stone. Early excretion of contrast, reaching the right bladder, likely test bolus. 26 mm right renal cyst. Left renal sinus cysts. Unremarkable bladder.  Reproductive:Incidental calcified fundal fibroid measuring 17 mm.  Stomach/Bowel: Pneumoperitoneum consistent with bowel perforation. Accumulation of gas in the upper abdomen and a visible mucosa defect in the ventral gastric body suggest a gastric ulcer perforation. There is mild colonic diverticulosis distally but no surrounding pneumoperitoneum. Reactive ascites and omental fat edema. No obstruction. No appendicitis.  Vascular/Lymphatic: Aortic atherosclerosis without aneurysm or dissection. Moderate stenosis of the right renal artery proximally. No other visceral branch stenosis. No mass or adenopathy.  Musculoskeletal: Diffuse disc degeneration, status post L4-5 discectomy with posterior rod and pedicle screw fixation. Remote appearing T5, T6, T8 and T11 superior endplate fractures.  Critical Value/emergent results and pulmonary nodule  were called by telephone at the time of interpretation on 10/16/2014 at 8:53 pm to Dr. Dalia Heading , who verbally acknowledged these results.  Review of the MIP images confirms the above findings.  IMPRESSION: 1. Bowel perforation with moderate pneumoperitoneum, strongly favor a gastric ulcer. 2. 11 mm right lower lobe pulmonary nodule, concerning for malignancy in this smoker. The nodule is at the limits of PET resolution. Could attempt PET or short followup chest CT (on the order of 3 months). 3. No acute aortic findings. 4. Moderate right renal artery stenosis. 5. Gallbladder sludge or small calculi.   Electronically Signed   By: Monte Fantasia M.D.   On: 10/16/2014 21:07    Anti-infectives: Anti-infectives    Start     Dose/Rate Route Frequency Ordered Stop   10/17/14 0900  fluconazole (DIFLUCAN) IVPB 400 mg    Comments:  Pharm to confirm dosing   400 mg 100 mL/hr over 120 Minutes Intravenous Every 24 hours 10/17/14 0748     10/17/14 0600  piperacillin-tazobactam (ZOSYN) IVPB 3.375 g     3.375 g 12.5 mL/hr over 240 Minutes Intravenous Every 8 hours 10/17/14 0014     10/17/14 0130  vancomycin (VANCOCIN) IVPB 1000 mg/200  mL premix     1,000 mg 200 mL/hr over 60 Minutes Intravenous Every 24 hours 10/17/14 0124     10/16/14 2115  piperacillin-tazobactam (ZOSYN) IVPB 3.375 g     3.375 g 100 mL/hr over 30 Minutes Intravenous  Once 10/16/14 2114 10/16/14 2215   10/16/14 2100  piperacillin-tazobactam (ZOSYN) IVPB 4.5 g  Status:  Discontinued     4.5 g 200 mL/hr over 30 Minutes Intravenous  Once 10/16/14 2054 10/16/14 2111       Assessment/Plan  POD 2, s/p open repair of perforated gastric ulcer with biopsy -cont PCA -cont BID dressing changes -cont NGT and NPO.  Does not need swallow eval prior to NGT removal, but needs at least 1-2 more days of bowel rest. -PT eval and mobilization -cont pulm toileting -surgically stable for tx to SDU when/if CCM feels she is stable for  SDU -Vanc/Zosyn D3, Diflucan D2.  Not sure patient needs vanc coverage.  Will d/w Dr. Redmond Pulling -protonix BID Respiratory failure -extubated yesterday, on 2L [place] today and working some, but in no distress -cont with nebs prn and O2 prn Peaked T waves -t waves were peaked this morning on monitor in lead III.  Switched to lead II with no peaking.  She denies chest pain.  Spoke to Dr. Titus Mould.  Will order EKG just to rule out any complications.  Her K is normal at 4.3 Lung nodule -will need outpatient follow up to have this evaluated.  I spoke to patient about this this morning.  She is aware. DVT Prophylaxis -SCDs/Lovenox  LOS: 2 days    OSBORNE,KELLY E 10/18/2014, 8:03 AM Pager: [phone]

## 2014-10-18 NOTE — Care Management Note (Signed)
Case Management Note  Patient Details  Name: Sally Douglas MRN: [id number] Date of Birth: 1945/10/14  Subjective/Objective:   Patient lives at home with spouse who does continue to work.  Has a sister in [place] and a daughter in [place] but she states they will not be able to assist when husband at work.  Still with NG tube and oxygen.  PT states did well moving to chair.  Depending on progression.... Will depend on dispo.   Action/Plan:   Expected Discharge Date:                  Expected Discharge Plan:  Home/Self Care  In-House Referral:     Discharge planning Services  CM Consult  Post Acute Care Choice:    Choice offered to:     DME Arranged:    DME Agency:     HH Arranged:    HH Agency:     Status of Service:  In process, will continue to follow  Medicare Important Message Given:    Date Medicare IM Given:    Medicare IM give by:    Date Additional Medicare IM Given:    Additional Medicare Important Message give by:     If discussed at [place] of Stay Meetings, dates discussed:    Additional Comments:  Vergie Living, RN 10/18/2014, 2:26 PM

## 2014-10-18 NOTE — Progress Notes (Signed)
PULMONARY  / [place]   Name: Sally Douglas MRN: [id number] DOB: Aug 22, 1945    ADMISSION DATE:  10/16/2014 CONSULTATION DATE: October 18, 2014  REQUESTING CLINICIAN: Md Ccs, MD PRIMARY SERVICE: Surgery  CHIEF COMPLAINT:  Abdominal Pain  BRIEF PATIENT DESCRIPTION: 69 y/o woman with sudden onset abdominal pain found to have perforated gastric ulcer.   SIGNIFICANT EVENTS / STUDIES:  9/18 CT of Chest/Abd/Pelvis ->> Free peritoneal air, 11 mm lung nodule. 9/19 Ex Lap w/ repair of gastric perforation  LINES / TUBES: 7 mm ETT 10/16/14>>>9/19 JP drain in abdomen 10/16/14 Foley 10/15/17  CULTURES: Blood 9/18 Intra-op peritoneal fluid 9/18  ANTIBIOTICS: Pip-tazo 9/19 >> vanc 9/19>>> Diflucan 9/19>>>  SUBJECTIVE:  No CP, T wave changes  VITAL SIGNS: Temp:  [97.9 F (36.6 C)-98.7 F (37.1 C)] 98.7 F (37.1 C) (09/20 0804) Pulse Rate:  [57-79] 72 (09/20 1000) Resp:  [10-23] 17 (09/20 1000) BP: (122-163)/(51-90) 161/89 mmHg (09/20 1000) SpO2:  [92 %-98 %] 95 % (09/20 1000) HEMODYNAMICS:   VENTILATOR SETTINGS:   INTAKE / OUTPUT: Intake/Output      09/19 0701 - 09/20 0700 09/20 0701 - 09/21 0700   I.V. (mL/kg) 2423.3 (32.4) 400 (5.3)   IV Piggyback 550 100   Total Intake(mL/kg) 2973.3 (39.7) 500 (6.7)   Urine (mL/kg/hr) 1295 (0.7) 275 (1.1)   Drains 40 (0)    Total Output 1335 275   Net +1638.3 +225          PHYSICAL EXAMINATION: General:  No distress off vent Neuro: awake, nonfocal HEENT:  MMM Neck: no LAD Cardiovascular: s1 s2 RRR no r/m Lungs:  CTAB Abdomen:  Dressings in place. JP drain in place. Musculoskeletal:  No joint swelling. Skin:  No rashes.  LABS:  CBC  Recent Labs Lab 10/16/14 2013 10/17/14 0212 10/18/14 0310  WBC 24.9* 20.6* 19.4*  HGB 14.5 12.7 11.0*  HCT 44.0 39.8 35.2*  PLT 378 292 241   Coag's No results for input(s): APTT, INR in the last 168 hours. BMET  Recent Labs Lab 10/16/14 2013  10/16/14 2019 10/17/14 0212 10/18/14 0310  NA 137  --  140 135  K 3.5  --  3.7 4.3  CL 101  --  108 105  CO2 22  --  23 22  BUN 32*  --  28* 23*  CREATININE 1.16* 1.10* 1.27* 1.22*  GLUCOSE 148*  --  182* 189*   Electrolytes  Recent Labs Lab 10/16/14 2013 10/17/14 0212 10/18/14 0310  CALCIUM 9.0 8.0* 8.5*   Sepsis Markers  Recent Labs Lab 10/16/14 2020  LATICACIDVEN 1.59   ABG  Recent Labs Lab 10/17/14 0218 10/17/14 0451  PHART 7.204* 7.265*  PCO2ART 57.6* 50.1*  PO2ART 96.0 109.0*   Liver Enzymes  Recent Labs Lab 10/16/14 2013  AST 22  ALT 33  ALKPHOS 71  BILITOT 0.7  ALBUMIN 3.6   Cardiac Enzymes  Recent Labs Lab 10/18/14 0903  TROPONINI 0.28*   Glucose  Recent Labs Lab 10/17/14 1132 10/17/14 1607 10/17/14 1959 10/17/14 2348 10/18/14 0343 10/18/14 0806  GLUCAP 127* 165* 130* 150* 173* 109*    Imaging Dg Chest 2 View  10/16/2014   CLINICAL DATA:  Centralized chest pain X 1 DAY, chest pressure X 1 DAY, cough X 1 day, hx of htn  EXAM: CHEST  2 VIEW  COMPARISON:  10/27/2012  FINDINGS: Lung volumes are relatively low. There is bibasilar atelectasis with some atelectasis along the minor fissure. No convincing pneumonia  and no pulmonary edema.  No pleural effusion or pneumothorax.  Cardiac silhouette is borderline enlarged. No mediastinal or hilar masses or convincing adenopathy.  Bony thorax is demineralized.  IMPRESSION: 1. Low lung volumes and lung base atelectasis and atelectasis along the minor fissure. No convincing pneumonia. No pulmonary edema.   Electronically Signed   By: Lajean Manes M.D.   On: 10/16/2014 19:44   Dg Chest Port 1 View  10/18/2014   CLINICAL DATA:  Patient with acute respiratory failure. Shortness of breath.  EXAM: PORTABLE CHEST - 1 VIEW  COMPARISON:  Chest radiograph 10/17/2014  FINDINGS: Enteric tube courses inferior to the diaphragm. Multiple monitoring leads overlie the patient. Interval extubation. Stable enlarged  cardiac mediastinal contours. Elevation of the right hemidiaphragm. Unchanged bilateral mid and lower lung heterogeneous pulmonary opacities. Probable small bilateral pleural effusions.  IMPRESSION: Interval extubation.  Small effusions and bibasilar atelectasis.   Electronically Signed   By: Lovey Newcomer M.D.   On: 10/18/2014 07:08   Dg Chest Port 1 View  10/17/2014   CLINICAL DATA:  Endotracheal tube placement  EXAM: PORTABLE CHEST - 1 VIEW  COMPARISON:  10/16/2014  FINDINGS: Endotracheal tube placed with tip measuring 3.7 cm above the carinal. Enteric tube tip is off the field of view but below the left hemidiaphragm. Linear atelectasis in the lung bases with fluid in the minor fissure on the right. No blunting of costophrenic angles. No pneumothorax. Mild cardiac enlargement.  IMPRESSION: Appliances appear in satisfactory location. Atelectasis in the lung bases with fluid in the right minor fissure.   Electronically Signed   By: Lucienne Capers M.D.   On: 10/17/2014 02:26   Ct Angio Chest Aorta W/cm &/or Wo/cm  10/16/2014   CLINICAL DATA:  Severe abdominal and chest pain  EXAM: CT ANGIOGRAPHY CHEST, ABDOMEN AND PELVIS  TECHNIQUE: Multidetector CT imaging through the chest, abdomen and pelvis was performed using the standard protocol during bolus administration of intravenous contrast. Multiplanar reconstructed images and MIPs were obtained and reviewed to evaluate the vascular anatomy.  CONTRAST:  100 cc Omnipaque 350 intravenous  COMPARISON:  None.  FINDINGS: CTA CHEST FINDINGS  THORACIC INLET/BODY WALL:  No acute abnormality.  MEDIASTINUM:  No aortic intramural hematoma or dissection. No aortic aneurysm. Mild atheromatous changes, greatest along the aortic arch. No cardiomegaly or pericardial effusion. No lymphadenopathy.  LUNG WINDOWS:  Bibasilar atelectasis. Lobulated pulmonary nodule in the right lower lobe, nearly touching the major fissure, measuring 11 mm (measured coronally).  OSSEOUS:  See below   Review of the MIP images confirms the above findings.  CTA ABDOMEN AND PELVIS FINDINGS  Abdominal wall: 3 small midline abdominal wall hernias containing fat. The upper hernia also contains pneumoperitoneum.  Hepatobiliary: Liver cysts, up to 32 mm in segment 4B. Possible hepatic steatosis.Layering high density within the gallbladder could be small stones or sludge. No acute inflammatory changes.  Pancreas: Unremarkable.  Spleen: Unremarkable.  Adrenals/Urinary Tract: Negative adrenals. No hydronephrosis or stone. Early excretion of contrast, reaching the right bladder, likely test bolus. 26 mm right renal cyst. Left renal sinus cysts. Unremarkable bladder.  Reproductive:Incidental calcified fundal fibroid measuring 17 mm.  Stomach/Bowel: Pneumoperitoneum consistent with bowel perforation. Accumulation of gas in the upper abdomen and a visible mucosa defect in the ventral gastric body suggest a gastric ulcer perforation. There is mild colonic diverticulosis distally but no surrounding pneumoperitoneum. Reactive ascites and omental fat edema. No obstruction. No appendicitis.  Vascular/Lymphatic: Aortic atherosclerosis without aneurysm or dissection. Moderate stenosis of  the right renal artery proximally. No other visceral branch stenosis. No mass or adenopathy.  Musculoskeletal: Diffuse disc degeneration, status post L4-5 discectomy with posterior rod and pedicle screw fixation. Remote appearing T5, T6, T8 and T11 superior endplate fractures.  Critical Value/emergent results and pulmonary nodule were called by telephone at the time of interpretation on 10/16/2014 at 8:53 pm to Dr. Dalia Heading , who verbally acknowledged these results.  Review of the MIP images confirms the above findings.  IMPRESSION: 1. Bowel perforation with moderate pneumoperitoneum, strongly favor a gastric ulcer. 2. 11 mm right lower lobe pulmonary nodule, concerning for malignancy in this smoker. The nodule is at the limits of PET  resolution. Could attempt PET or short followup chest CT (on the order of 3 months). 3. No acute aortic findings. 4. Moderate right renal artery stenosis. 5. Gallbladder sludge or small calculi.   Electronically Signed   By: Monte Fantasia M.D.   On: 10/16/2014 21:07   Ct Angio Abd/pel W/ And/or W/o  10/16/2014   CLINICAL DATA:  Severe abdominal and chest pain  EXAM: CT ANGIOGRAPHY CHEST, ABDOMEN AND PELVIS  TECHNIQUE: Multidetector CT imaging through the chest, abdomen and pelvis was performed using the standard protocol during bolus administration of intravenous contrast. Multiplanar reconstructed images and MIPs were obtained and reviewed to evaluate the vascular anatomy.  CONTRAST:  100 cc Omnipaque 350 intravenous  COMPARISON:  None.  FINDINGS: CTA CHEST FINDINGS  THORACIC INLET/BODY WALL:  No acute abnormality.  MEDIASTINUM:  No aortic intramural hematoma or dissection. No aortic aneurysm. Mild atheromatous changes, greatest along the aortic arch. No cardiomegaly or pericardial effusion. No lymphadenopathy.  LUNG WINDOWS:  Bibasilar atelectasis. Lobulated pulmonary nodule in the right lower lobe, nearly touching the major fissure, measuring 11 mm (measured coronally).  OSSEOUS:  See below  Review of the MIP images confirms the above findings.  CTA ABDOMEN AND PELVIS FINDINGS  Abdominal wall: 3 small midline abdominal wall hernias containing fat. The upper hernia also contains pneumoperitoneum.  Hepatobiliary: Liver cysts, up to 32 mm in segment 4B. Possible hepatic steatosis.Layering high density within the gallbladder could be small stones or sludge. No acute inflammatory changes.  Pancreas: Unremarkable.  Spleen: Unremarkable.  Adrenals/Urinary Tract: Negative adrenals. No hydronephrosis or stone. Early excretion of contrast, reaching the right bladder, likely test bolus. 26 mm right renal cyst. Left renal sinus cysts. Unremarkable bladder.  Reproductive:Incidental calcified fundal fibroid measuring 17  mm.  Stomach/Bowel: Pneumoperitoneum consistent with bowel perforation. Accumulation of gas in the upper abdomen and a visible mucosa defect in the ventral gastric body suggest a gastric ulcer perforation. There is mild colonic diverticulosis distally but no surrounding pneumoperitoneum. Reactive ascites and omental fat edema. No obstruction. No appendicitis.  Vascular/Lymphatic: Aortic atherosclerosis without aneurysm or dissection. Moderate stenosis of the right renal artery proximally. No other visceral branch stenosis. No mass or adenopathy.  Musculoskeletal: Diffuse disc degeneration, status post L4-5 discectomy with posterior rod and pedicle screw fixation. Remote appearing T5, T6, T8 and T11 superior endplate fractures.  Critical Value/emergent results and pulmonary nodule were called by telephone at the time of interpretation on 10/16/2014 at 8:53 pm to Dr. Dalia Heading , who verbally acknowledged these results.  Review of the MIP images confirms the above findings.  IMPRESSION: 1. Bowel perforation with moderate pneumoperitoneum, strongly favor a gastric ulcer. 2. 11 mm right lower lobe pulmonary nodule, concerning for malignancy in this smoker. The nodule is at the limits of PET resolution. Could attempt PET or  short followup chest CT (on the order of 3 months). 3. No acute aortic findings. 4. Moderate right renal artery stenosis. 5. Gallbladder sludge or small calculi.   Electronically Signed   By: Monte Fantasia M.D.   On: 10/16/2014 21:07    EKG: NEW flip T precordium and lateral 9/20 CXR: Fluid in R great fissure.  Chest CT shows lung nodule.  ASSESSMENT / PLAN:  PULMONARY A: Need for Mechanical Ventilation resolved RLL Lung nodule, possible malignancy ATX P:   - IS so important -upright -Recommend PET/CT in 4 weeks, to be done as outpatient. pcxr follow up  CARDIOVASCULAR A: New flip T waves precordial , lateral, r/o sichemia P:   Echo No asa with gastric perf BB if  able , not able as HR low Trop assessment ensure new lytes STAT  RENAL A: Mild renal insfufficency May be chronic.  P: Follow BMP now stat  Saline to 50  GASTROINTESTINAL A: Perforated gastric ulcer, s/p ex lap P:   Defer to primary team.  HEMATOLOGIC A: Possible malignancy, RLL nodule P:   As above, will follow as outpatient. lovenox with possible cancer  INFECTIOUS A: Perforated gastric ucler Peritonitis  P:   Peritoneal fluid 9/18 >>  Blood 9/19 >>   Zosyn 9/18 >>  vanco 9/18 >>  Diflucan 9/18>>>  Add stop date diflucan  Will narrow in 48 hr further  ENDOCRINE A: Hyperglycemia P:   SSI Off dextrose  NEUROLOGIC A: Need for sedation P:   Pain control Ok to mobilize  To tele ok with trop neg or low  Lavon Paganini. Titus Mould, MD, [place] Pgr: [facility]

## 2014-10-18 NOTE — Progress Notes (Signed)
PT Cancellation Note  Patient Details Name: Sally Douglas MRN: [id number] DOB: [birth year]   Cancelled Treatment:    Reason Eval/Treat Not Completed: Patient not medically ready. Pt with EKG changes this morning and elevated troponin. Will hold PT eval at this time and check tomorrow for medical readiness to participate.    Rolinda Roan 10/18/2014, 10:14 AM   Rolinda Roan, PT, DPT Acute Rehabilitation Services Pager: [phone]

## 2014-10-19 ENCOUNTER — Inpatient Hospital Stay (HOSPITAL_COMMUNITY): Payer: Medicare Other

## 2014-10-19 DIAGNOSIS — I10 Essential (primary) hypertension: Secondary | ICD-10-CM

## 2014-10-19 DIAGNOSIS — I248 Other forms of acute ischemic heart disease: Secondary | ICD-10-CM

## 2014-10-19 DIAGNOSIS — R9431 Abnormal electrocardiogram [ECG] [EKG]: Secondary | ICD-10-CM

## 2014-10-19 LAB — BASIC METABOLIC PANEL
ANION GAP: 10 (ref 5–15)
BUN: 20 mg/dL (ref 6–20)
CHLORIDE: 105 mmol/L (ref 101–111)
CO2: 23 mmol/L (ref 22–32)
Calcium: 9 mg/dL (ref 8.9–10.3)
Creatinine, Ser: 1.11 mg/dL — ABNORMAL HIGH (ref 0.44–1.00)
GFR calc Af Amer: 57 mL/min — ABNORMAL LOW (ref >60)
GFR, EST NON AFRICAN AMERICAN: 49 mL/min — AB (ref >60)
GLUCOSE: 108 mg/dL — AB (ref 65–99)
POTASSIUM: 4.1 mmol/L (ref 3.5–5.1)
Sodium: 138 mmol/L (ref 135–145)

## 2014-10-19 LAB — CBC
HEMATOCRIT: 35.8 % — AB (ref 36.0–46.0)
HEMOGLOBIN: 11.5 g/dL — AB (ref 12.0–15.0)
MCH: 29.9 pg (ref 26.0–34.0)
MCHC: 32.1 g/dL (ref 30.0–36.0)
MCV: 93 fL (ref 78.0–100.0)
Platelets: 264 10*3/uL (ref 150–400)
RBC: 3.85 MIL/uL — AB (ref 3.87–5.11)
RDW: 15.5 % (ref 11.5–15.5)
WBC: 17.7 10*3/uL — AB (ref 4.0–10.5)

## 2014-10-19 LAB — GLUCOSE, CAPILLARY
GLUCOSE-CAPILLARY: 122 mg/dL — AB (ref 65–99)
GLUCOSE-CAPILLARY: 120 mg/dL — AB (ref 65–99)
Glucose-Capillary: 101 mg/dL — ABNORMAL HIGH (ref 65–99)
Glucose-Capillary: 102 mg/dL — ABNORMAL HIGH (ref 65–99)
Glucose-Capillary: 94 mg/dL (ref 65–99)
Glucose-Capillary: 95 mg/dL (ref 65–99)

## 2014-10-19 MED ORDER — PHENOL 1.4 % MT LIQD
1.0000 | OROMUCOSAL | Status: DC | PRN
  Administered 2014-10-19: 1 via OROMUCOSAL
  Filled 2014-10-19: qty 177

## 2014-10-19 MED ORDER — METHOCARBAMOL 1000 MG/10ML IJ SOLN
1000.0000 mg | Freq: Three times a day (TID) | INTRAVENOUS | Status: DC | PRN
  Administered 2014-10-19: 1000 mg via INTRAVENOUS
  Filled 2014-10-19 (×4): qty 10

## 2014-10-19 MED ORDER — HYDROMORPHONE HCL 1 MG/ML IJ SOLN
0.5000 mg | INTRAMUSCULAR | Status: DC | PRN
  Administered 2014-10-19 – 2014-10-21 (×14): 1 mg via INTRAVENOUS
  Filled 2014-10-19 (×14): qty 1

## 2014-10-19 MED ORDER — HYDRALAZINE HCL 20 MG/ML IJ SOLN
10.0000 mg | Freq: Four times a day (QID) | INTRAMUSCULAR | Status: DC | PRN
  Administered 2014-10-19 – 2014-10-20 (×3): 10 mg via INTRAVENOUS
  Filled 2014-10-19 (×4): qty 1

## 2014-10-19 MED ORDER — METOPROLOL TARTRATE 1 MG/ML IV SOLN
5.0000 mg | Freq: Two times a day (BID) | INTRAVENOUS | Status: DC
  Administered 2014-10-19 – 2014-10-20 (×2): 5 mg via INTRAVENOUS
  Filled 2014-10-19 (×3): qty 5

## 2014-10-19 MED ORDER — ACETAMINOPHEN 10 MG/ML IV SOLN
1000.0000 mg | Freq: Four times a day (QID) | INTRAVENOUS | Status: AC
  Administered 2014-10-19 – 2014-10-20 (×4): 1000 mg via INTRAVENOUS
  Filled 2014-10-19 (×4): qty 100

## 2014-10-19 MED ORDER — FUROSEMIDE 10 MG/ML IJ SOLN
40.0000 mg | Freq: Once | INTRAMUSCULAR | Status: AC
Start: 2014-10-19 — End: 2014-10-19
  Administered 2014-10-19: 40 mg via INTRAVENOUS
  Filled 2014-10-19: qty 4

## 2014-10-19 NOTE — Care Management Note (Signed)
Case Management Note  Patient Details  Name: Sally Douglas MRN: [id number] Date of Birth: 1945/05/03  Subjective/Objective:                  Date: 10-19-14 Patient transferred from ICU within past 24 hours. Admitted with perferated gastric ulcer. Is being followed by surgery team. Has midline abd incision with wet to dry dressing, JP, and PCA per bedside RN Romie Minus. Patient from home with spouse. Anticipate HH PT and possibly RN for wound and drain care.  Plan: CM will continue to follow for discharge planning and [facility] resources.   Carles Collet RN BSN CM [phone]   Action/Plan:   Expected Discharge Date:                  Expected Discharge Plan:  [place]  In-House Referral:     Discharge planning Services  CM Consult  Post Acute Care Choice:    Choice offered to:     DME Arranged:    DME Agency:     HH Arranged:    HH Agency:     Status of Service:  In process, will continue to follow  Medicare Important Message Given:  Yes-second notification given Date Medicare IM Given:    Medicare IM give by:    Date Additional Medicare IM Given:    Additional Medicare Important Message give by:     If discussed at [place] of Stay Meetings, dates discussed:    Additional Comments:  Carles Collet, RN 10/19/2014, 12:33 PM

## 2014-10-19 NOTE — Care Management Important Message (Signed)
Important Message  Patient Details  Name: Sally Douglas MRN: [id number] Date of Birth: 11-06-1945   Medicare Important Message Given:  Yes-second notification given    Loann Quill 10/19/2014, 10:58 AM

## 2014-10-19 NOTE — Progress Notes (Signed)
  Echocardiogram 2D Echocardiogram has been performed.  Bobbye Charleston 10/19/2014, 4:12 PM

## 2014-10-19 NOTE — Progress Notes (Addendum)
[facility] PROGRESS NOTE  Sally Douglas YKZ:[id number] DOB: 18-Nov-1945 DOA: 10/16/2014 PCP: Nyoka Cowden, MD   Brief narrative Medial consult for a  69 year old female with history of hypertension, tobacco abuse, and low back pain who presented with acute onset epigastric pain radiating to bilateral shoulder. She was septic with significant leukocytosis on presentation (20 5K). CT angiographic chest abdomen and pelvis showed free peritoneal air with fluid consistent with perforated gastric ulcer. Patient taken to OR on 9/19 with exploratory laparotomy with repair of gastric perforation and a JP drain placed in . Patient extubated on 9/20 and transferred to medical floor.    Assessment/Plan: Sepsis secondary to perforated gastric ulcer with peritonitis Status post laparotomy with repair. Continue empiric vancomycin, Zosyn. Fluid culture growing few gram-positive cocci in pairs and clusters. Continue gentle IV hydration and pain control primary team. Continue NG. Patient currently nothing by mouth. Continue IS. WBC improving, afebrile. IV PPI twice a day. -On Dilaudid PCA for pain control which has been discontinued by primary team and switched to when necessary IV Dilaudid.  Elevated troponin Possibly associated with demand ischemia. T-wave inversion in lateral leads noted. Patient denies any chest pain symptoms. Order 2-D echo. Denies any cardiac history or workup done in the past. Cannot use ASA due to gastric perforation. Will add scheduled IV metoprolol. Recommend cardiac workup as outpatient.   Acute kidney injury Mild secondary to sepsis. Improving with IV hydration.  Right lower lobe nodule Incidental finding with 11 mm right lower lobe lung nodule. Given her smoking history she would need a PET scan versus follow-up chest CT in 3 months.  Tobacco abuse Counseled on cessation. Order nicotine patch  Shortness of breath Patient appears wheezy on exam recommend to  minimize fluid. Ordered 1 dose of IV Lasix.  Essential hypertension When necessary hydralazine.   Diet: Nothing by mouth Prophylaxis: Subcutaneous Lovenox  Code Status: Full code Family Communication: None and daughter at bedside Disposition Plan: Per primary.      Procedure  Laparotomy with repair of perforated gastric ulcer on 9/18  CT angiogram chest abdomen and pelvis.  Antibiotics:  Vancomycin/Zosyn/Diflucan 9/18--  HPI/Subjective: Seen and examined. Complains of some shortness of breath and abdominal pain.  Objective: Filed Vitals:   10/19/14 1322  BP: 158/91  Pulse: 88  Temp: 97.8 F (36.6 C)  Resp: 16    Intake/Output Summary (Last 24 hours) at 10/19/14 1445 Last data filed at 10/19/14 1325  Gross per 24 hour  Intake  862.5 ml  Output   5585 ml  Net -4722.5 ml   Filed Weights   10/16/14 1838  Weight: 74.844 kg (165 lb)    Exam:   General: Elderly female in some pain and discomfort  HEENT: No pallor, NG in place, supple neck moist mucosa  Cardiovascular: Normal S1 and S2, no murmurs rub or gallop  Respiratory: Scattered rhonchi bilaterally  Abdomen: Dressing over laparotomy site clean, absent bowel sounds  Musculoskeletal: Warm, trace edema  CNS: Alert and oriented  Data Reviewed: Basic Metabolic Panel:  Recent Labs Lab 10/16/14 2013 10/16/14 2019 10/17/14 0212 10/18/14 0310 10/18/14 1123 10/19/14 0030  NA 137  --  140 135 134* 138  K 3.5  --  3.7 4.3 4.0 4.1  CL 101  --  108 105 101 105  CO2 22  --  23 22 24 23   GLUCOSE 148*  --  182* 189* 127* 108*  BUN 32*  --  28* 23* 20 20  CREATININE 1.16*  1.10* 1.27* 1.22* 1.13* 1.11*  CALCIUM 9.0  --  8.0* 8.5* 8.5* 9.0  MG  --   --   --   --  1.8  --   PHOS  --   --   --   --  4.2  --    Liver Function Tests:  Recent Labs Lab 10/16/14 2013  AST 22  ALT 33  ALKPHOS 71  BILITOT 0.7  PROT 7.0  ALBUMIN 3.6    Recent Labs Lab 10/16/14 2013  LIPASE 31   No results  for input(s): AMMONIA in the last 168 hours. CBC:  Recent Labs Lab 10/16/14 2013 10/17/14 0212 10/18/14 0310 10/19/14 0030  WBC 24.9* 20.6* 19.4* 17.7*  NEUTROABS 21.3*  --   --   --   HGB 14.5 12.7 11.0* 11.5*  HCT 44.0 39.8 35.2* 35.8*  MCV 91.5 95.4 93.9 93.0  PLT 378 292 241 264   Cardiac Enzymes:  Recent Labs Lab 10/18/14 0903 10/18/14 1443 10/18/14 2025  TROPONINI 0.28* 0.28* 0.33*   BNP (last 3 results) No results for input(s): BNP in the last 8760 hours.  ProBNP (last 3 results) No results for input(s): PROBNP in the last 8760 hours.  CBG:  Recent Labs Lab 10/18/14 2048 10/19/14 0032 10/19/14 0416 10/19/14 0804 10/19/14 1200  GLUCAP 100* 101* 95 122* 94    Recent Results (from the past 240 hour(s))  Body fluid culture     Status: None   Collection Time: 10/16/14 10:55 PM  Result Value Ref Range Status   Specimen Description FLUID PERITONEAL  Final   Special Requests NONE  Final   Gram Stain   Final    DIRECT SMEAR ABUNDANT WBC PRESENT,BOTH PMN AND MONONUCLEAR FEW GRAM POSITIVE COCCI IN PAIRS IN CLUSTERS CRITICAL RESULT CALLED TO, READ BACK BY AND VERIFIED WITH: S.MYERS,RN 3154 10/17/14 M.CAMPBELL CONFIRMED BY B.MARTIN    Culture MULTIPLE ORGANISMS PRESENT, NONE PREDOMINANT  Final   Report Status 10/18/2014 FINAL  Final  Anaerobic culture     Status: None (Preliminary result)   Collection Time: 10/16/14 10:55 PM  Result Value Ref Range Status   Specimen Description FLUID PERITONEAL  Final   Special Requests NONE  Final   Gram Stain   Final    ABUNDANT WBC PRESENT,BOTH PMN AND MONONUCLEAR FEW GRAM POSITIVE COCCI IN PAIRS IN CLUSTERS    Culture   Final    NO ANAEROBES ISOLATED; CULTURE IN PROGRESS FOR 5 DAYS   Report Status PENDING  Incomplete  Anaerobic culture     Status: None (Preliminary result)   Collection Time: 10/16/14 11:10 PM  Result Value Ref Range Status   Specimen Description TISSUE STOMA  Final   Special Requests NONE  Final    Gram Stain   Final    NO WBC SEEN NO SQUAMOUS EPITHELIAL CELLS SEEN NO ORGANISMS SEEN Performed at [facility]    Culture   Final    NO ANAEROBES ISOLATED; CULTURE IN PROGRESS FOR 5 DAYS Performed at [facility]    Report Status PENDING  Incomplete  Tissue culture     Status: None (Preliminary result)   Collection Time: 10/16/14 11:10 PM  Result Value Ref Range Status   Specimen Description TISSUE STOMA  Final   Special Requests NONE  Final   Gram Stain   Final    NO WBC SEEN NO SQUAMOUS EPITHELIAL CELLS SEEN NO ORGANISMS SEEN Performed at [facility]  Final    NO GROWTH 2 DAYS Performed at [facility]    Report Status PENDING  Incomplete  MRSA PCR Screening     Status: None   Collection Time: 10/17/14 12:20 AM  Result Value Ref Range Status   MRSA by PCR NEGATIVE NEGATIVE Final    Comment:        The GeneXpert MRSA Assay (FDA approved for NASAL specimens only), is one component of a comprehensive MRSA colonization surveillance program. It is not intended to diagnose MRSA infection nor to guide or monitor treatment for MRSA infections.   Culture, blood (single)     Status: None (Preliminary result)   Collection Time: 10/17/14 12:25 AM  Result Value Ref Range Status   Specimen Description BLOOD RIGHT HAND  Final   Special Requests IN PEDIATRIC BOTTLE 2CC  Final   Culture NO GROWTH 2 DAYS  Final   Report Status PENDING  Incomplete     Studies: Dg Chest Port 1 View  10/18/2014   CLINICAL DATA:  Patient with acute respiratory failure. Shortness of breath.  EXAM: PORTABLE CHEST - 1 VIEW  COMPARISON:  Chest radiograph 10/17/2014  FINDINGS: Enteric tube courses inferior to the diaphragm. Multiple monitoring leads overlie the patient. Interval extubation. Stable enlarged cardiac mediastinal contours. Elevation of the right hemidiaphragm. Unchanged bilateral mid and lower lung heterogeneous pulmonary opacities. Probable  small bilateral pleural effusions.  IMPRESSION: Interval extubation.  Small effusions and bibasilar atelectasis.   Electronically Signed   By: Lovey Newcomer M.D.   On: 10/18/2014 07:08    Scheduled Meds: . acetaminophen  1,000 mg Intravenous 4 times per day  . antiseptic oral rinse  7 mL Mouth Rinse QID  . chlorhexidine gluconate  15 mL Mouth Rinse BID  . enoxaparin (LOVENOX) injection  40 mg Subcutaneous Q24H  . fluconazole (DIFLUCAN) IV  400 mg Intravenous Q24H  . insulin aspart  0-24 Units Subcutaneous 6 times per day  . pantoprazole (PROTONIX) IV  40 mg Intravenous Q12H  . piperacillin-tazobactam (ZOSYN)  IV  3.375 g Intravenous Q8H  . vancomycin  1,000 mg Intravenous Q24H   Continuous Infusions: . sodium chloride 50 mL/hr (10/18/14 1200)      Time spent: 35 minutes    DHUNGEL, [facility] Pager [phone] If 7PM-7AM, please contact night-coverage at [email or web address], password [facility] 10/19/2014, 2:45 PM  LOS: 3 days

## 2014-10-19 NOTE — Progress Notes (Signed)
[facility] Progress Note  3 Days Post-Op  Subjective: Patient and husband have concerns about nursing staff overnight.  She still c/o pain and annoyance of PCA beeping.  She's breathing better.  No N/V, NG output 166mL recorded, but getting lighter.  No flatus or BM yet.    Objective: Vital signs in last 24 hours: Temp:  [98.2 F (36.8 C)-99.6 F (37.6 C)] 98.9 F (37.2 C) (09/21 0808) Pulse Rate:  [58-89] 81 (09/21 0808) Resp:  [11-22] 20 (09/21 0808) BP: (119-185)/(70-116) 178/96 mmHg (09/21 0808) SpO2:  [93 %-98 %] 94 % (09/21 0747)    Intake/Output from previous day: 09/20 0701 - 09/21 0700 In: 1592.5 [I.V.:1150; IV Piggyback:412.5] Out: 9935 [Urine:4025; Emesis/NG output:120; Drains:40] Intake/Output this shift:    PE: Gen:  Alert, NAD, pleasant Card:  RRR, no M/G/R heard Pulm:  CTA, no W/R/R, expiratory wheezing, course breath sounds Abd: Soft, distended, tender, minimal BS, no HSM, incisions C/D/I, wound beefy red, drain with serosanguinous drainage   Lab Results:   Recent Labs  10/18/14 0310 10/19/14 0030  WBC 19.4* 17.7*  HGB 11.0* 11.5*  HCT 35.2* 35.8*  PLT 241 264   BMET  Recent Labs  10/18/14 1123 10/19/14 0030  NA 134* 138  K 4.0 4.1  CL 101 105  CO2 24 23  GLUCOSE 127* 108*  BUN 20 20  CREATININE 1.13* 1.11*  CALCIUM 8.5* 9.0   PT/INR No results for input(s): LABPROT, INR in the last 72 hours. CMP     Component Value Date/Time   NA 138 10/19/2014 0030   K 4.1 10/19/2014 0030   CL 105 10/19/2014 0030   CO2 23 10/19/2014 0030   GLUCOSE 108* 10/19/2014 0030   BUN 20 10/19/2014 0030   CREATININE 1.11* 10/19/2014 0030   CALCIUM 9.0 10/19/2014 0030   PROT 7.0 10/16/2014 2013   ALBUMIN 3.6 10/16/2014 2013   AST 22 10/16/2014 2013   ALT 33 10/16/2014 2013   ALKPHOS 71 10/16/2014 2013   BILITOT 0.7 10/16/2014 2013   GFRNONAA 49* 10/19/2014 0030   GFRAA 57* 10/19/2014 0030   Lipase     Component Value Date/Time   LIPASE 31 10/16/2014 2013       Studies/Results: Dg Chest Port 1 View  10/18/2014   CLINICAL DATA:  Patient with acute respiratory failure. Shortness of breath.  EXAM: PORTABLE CHEST - 1 VIEW  COMPARISON:  Chest radiograph 10/17/2014  FINDINGS: Enteric tube courses inferior to the diaphragm. Multiple monitoring leads overlie the patient. Interval extubation. Stable enlarged cardiac mediastinal contours. Elevation of the right hemidiaphragm. Unchanged bilateral mid and lower lung heterogeneous pulmonary opacities. Probable small bilateral pleural effusions.  IMPRESSION: Interval extubation.  Small effusions and bibasilar atelectasis.   Electronically Signed   By: Lovey Newcomer M.D.   On: 10/18/2014 07:08    Anti-infectives: Anti-infectives    Start     Dose/Rate Route Frequency Ordered Stop   10/17/14 0900  fluconazole (DIFLUCAN) IVPB 400 mg    Comments:  Pharm to confirm dosing   400 mg 100 mL/hr over 120 Minutes Intravenous Every 24 hours 10/17/14 0748 10/24/14 0859   10/17/14 0600  piperacillin-tazobactam (ZOSYN) IVPB 3.375 g     3.375 g 12.5 mL/hr over 240 Minutes Intravenous Every 8 hours 10/17/14 0014     10/17/14 0130  vancomycin (VANCOCIN) IVPB 1000 mg/200 mL premix     1,000 mg 200 mL/hr over 60 Minutes Intravenous Every 24 hours 10/17/14 0124  10/16/14 2115  piperacillin-tazobactam (ZOSYN) IVPB 3.375 g     3.375 g 100 mL/hr over 30 Minutes Intravenous  Once 10/16/14 2114 10/16/14 2215   10/16/14 2100  piperacillin-tazobactam (ZOSYN) IVPB 4.5 g  Status:  Discontinued     4.5 g 200 mL/hr over 30 Minutes Intravenous  Once 10/16/14 2054 10/16/14 2111       Assessment/Plan POD #3, s/p open repair of perforated gastric ulcer with biopsy -D/C PCA, start IVP, add robaxin, IV tylenol, ice, and abdominal binder -cont WD BID dressing changes -cont NGT and NPO. Does not need swallow eval prior to NGT removal, but no bowel function yet.  Await flatus. -PT eval and mobilization -  PT recommending HH and 24hr supervision -Cont pulm toileting -On floor -Vanc/Zosyn Day #4, Diflucan Day #3. Continue on Vanc, following cultures given GPC on gram stain.  Hopefully can de-escilate soon. -Protonix BID -CCM repleted Mg Respiratory failure -extubated 9/19, on 2L [place] today and working some, but in no distress -cont with nebs prn and O2 prn -Medicine following and have started lasix, continue foley today - d/c tomorrow Peaked T waves -T waves were peaked yesterday morning.  CCM/medicine following.  She denies chest pain. K normal.  Will leave it up to them to determine if cardiology is needed. Lung nodule -will need outpatient follow up to have this evaluated. DVT Prophylaxis -SCDs/Lovenox    LOS: 3 days    Nat Christen 10/19/2014, 10:09 AM Pager: [phone]

## 2014-10-19 NOTE — Progress Notes (Signed)
Discontinued PCA wasted 14mg  Dilaudid with Doristine Devoid, RN

## 2014-10-20 DIAGNOSIS — R931 Abnormal findings on diagnostic imaging of heart and coronary circulation: Secondary | ICD-10-CM

## 2014-10-20 DIAGNOSIS — K251 Acute gastric ulcer with perforation: Secondary | ICD-10-CM

## 2014-10-20 DIAGNOSIS — I214 Non-ST elevation (NSTEMI) myocardial infarction: Secondary | ICD-10-CM

## 2014-10-20 DIAGNOSIS — R9431 Abnormal electrocardiogram [ECG] [EKG]: Secondary | ICD-10-CM

## 2014-10-20 DIAGNOSIS — R7989 Other specified abnormal findings of blood chemistry: Secondary | ICD-10-CM

## 2014-10-20 LAB — BASIC METABOLIC PANEL
Anion gap: 8 (ref 5–15)
BUN: 19 mg/dL (ref 6–20)
CO2: 23 mmol/L (ref 22–32)
CREATININE: 1.01 mg/dL — AB (ref 0.44–1.00)
Calcium: 8.7 mg/dL — ABNORMAL LOW (ref 8.9–10.3)
Chloride: 105 mmol/L (ref 101–111)
GFR calc Af Amer: >60 mL/min (ref >60)
GFR, EST NON AFRICAN AMERICAN: 55 mL/min — AB (ref >60)
GLUCOSE: 126 mg/dL — AB (ref 65–99)
Potassium: 3.6 mmol/L (ref 3.5–5.1)
SODIUM: 136 mmol/L (ref 135–145)

## 2014-10-20 LAB — VANCOMYCIN, TROUGH: VANCOMYCIN TR: 9 ug/mL — AB (ref 10.0–20.0)

## 2014-10-20 LAB — GLUCOSE, CAPILLARY
GLUCOSE-CAPILLARY: 110 mg/dL — AB (ref 65–99)
GLUCOSE-CAPILLARY: 119 mg/dL — AB (ref 65–99)
GLUCOSE-CAPILLARY: 121 mg/dL — AB (ref 65–99)
Glucose-Capillary: 116 mg/dL — ABNORMAL HIGH (ref 65–99)
Glucose-Capillary: 117 mg/dL — ABNORMAL HIGH (ref 65–99)
Glucose-Capillary: 123 mg/dL — ABNORMAL HIGH (ref 65–99)

## 2014-10-20 LAB — CBC
HCT: 36 % (ref 36.0–46.0)
Hemoglobin: 11.8 g/dL — ABNORMAL LOW (ref 12.0–15.0)
MCH: 29.9 pg (ref 26.0–34.0)
MCHC: 32.8 g/dL (ref 30.0–36.0)
MCV: 91.1 fL (ref 78.0–100.0)
PLATELETS: 265 10*3/uL (ref 150–400)
RBC: 3.95 MIL/uL (ref 3.87–5.11)
RDW: 15.6 % — AB (ref 11.5–15.5)
WBC: 14.2 10*3/uL — AB (ref 4.0–10.5)

## 2014-10-20 LAB — TISSUE CULTURE
CULTURE: NO GROWTH
GRAM STAIN: NONE SEEN

## 2014-10-20 MED ORDER — HYDRALAZINE HCL 20 MG/ML IJ SOLN
10.0000 mg | Freq: Three times a day (TID) | INTRAMUSCULAR | Status: DC
  Administered 2014-10-20: 10 mg via INTRAVENOUS

## 2014-10-20 MED ORDER — ISOSORBIDE MONONITRATE ER 30 MG PO TB24
30.0000 mg | ORAL_TABLET | Freq: Every day | ORAL | Status: DC
  Administered 2014-10-20 – 2014-10-23 (×4): 30 mg via ORAL
  Filled 2014-10-20 (×5): qty 1

## 2014-10-20 MED ORDER — METOPROLOL TARTRATE 50 MG PO TABS
50.0000 mg | ORAL_TABLET | Freq: Two times a day (BID) | ORAL | Status: DC
  Administered 2014-10-20 – 2014-10-23 (×7): 50 mg via ORAL
  Filled 2014-10-20 (×9): qty 1

## 2014-10-20 MED ORDER — ACETAMINOPHEN 10 MG/ML IV SOLN
1000.0000 mg | Freq: Four times a day (QID) | INTRAVENOUS | Status: AC
  Administered 2014-10-20 – 2014-10-21 (×2): 1000 mg via INTRAVENOUS
  Filled 2014-10-20 (×4): qty 100

## 2014-10-20 MED ORDER — PANTOPRAZOLE SODIUM 40 MG PO TBEC: 40.0000 mg | DELAYED_RELEASE_TABLET | Freq: Two times a day (BID) | ORAL | Status: DC

## 2014-10-20 MED ORDER — HYDRALAZINE HCL 25 MG PO TABS
25.0000 mg | ORAL_TABLET | Freq: Three times a day (TID) | ORAL | Status: DC
  Administered 2014-10-20 – 2014-10-21 (×3): 25 mg via ORAL
  Filled 2014-10-20 (×4): qty 1

## 2014-10-20 MED ORDER — VANCOMYCIN HCL IN DEXTROSE 750-5 MG/150ML-% IV SOLN
750.0000 mg | Freq: Two times a day (BID) | INTRAVENOUS | Status: DC
  Administered 2014-10-20 – 2014-10-21 (×2): 750 mg via INTRAVENOUS
  Filled 2014-10-20 (×3): qty 150

## 2014-10-20 MED ORDER — GI COCKTAIL ~~LOC~~
30.0000 mL | Freq: Once | ORAL | Status: AC
  Administered 2014-10-20: 30 mL via ORAL
  Filled 2014-10-20: qty 30

## 2014-10-20 MED ORDER — CHLORHEXIDINE GLUCONATE 0.12 % MT SOLN
OROMUCOSAL | Status: AC
  Filled 2014-10-20: qty 15

## 2014-10-20 MED ORDER — BISACODYL 10 MG RE SUPP
10.0000 mg | Freq: Every day | RECTAL | Status: DC | PRN
  Administered 2014-10-21: 10 mg via RECTAL
  Filled 2014-10-20: qty 1

## 2014-10-20 NOTE — Progress Notes (Signed)
Md Dhungel paged, per central telemetry patient went into [facility] arrhythmia, patient currently asymptomatic, tolerating physical therapy ambulation well.

## 2014-10-20 NOTE — Progress Notes (Signed)
[facility] Progress Note  4 Days Post-Op  Subjective: Pt doing much better, had a much improved night.  Pain is significantly improved.  No N/V, thirsty/hungry.  Minimal flatus, no BM yet.  Would like NG out.  Says she's getting around better.  Just got bath.    Objective: Vital signs in last 24 hours: Temp:  [97.8 F (36.6 C)-98.9 F (37.2 C)] 98.4 F (36.9 C) (09/22 0556) Pulse Rate:  [85-98] 98 (09/22 0836) Resp:  [16-21] 21 (09/22 0836) BP: (143-182)/(72-102) 182/102 mmHg (09/22 0836) SpO2:  [94 %-96 %] 95 % (09/22 0556) Last BM Date: 10/15/14  Intake/Output from previous day: 09/21 0701 - 09/22 0700 In: 0  Out: 3775 [Urine:3625; Emesis/NG output:100; Drains:50] Intake/Output this shift: Total I/O In: 196.7 [I.V.:196.7] Out: -   PE: Gen:  Alert, NAD, pleasant Card:  RRR, no M/G/R heard Pulm:  CTA, no W/R/R, great effort, lungs sound much less wet Abd: Soft, mild distension, mild tenderness, +BS, no HSM, midline wound C/D/I, drain with sanguinous drainage  Lab Results:   Recent Labs  10/19/14 0030 10/20/14 0051  WBC 17.7* 14.2*  HGB 11.5* 11.8*  HCT 35.8* 36.0  PLT 264 265   BMET  Recent Labs  10/19/14 0030 10/20/14 0051  NA 138 136  K 4.1 3.6  CL 105 105  CO2 23 23  GLUCOSE 108* 126*  BUN 20 19  CREATININE 1.11* 1.01*  CALCIUM 9.0 8.7*   PT/INR No results for input(s): LABPROT, INR in the last 72 hours. CMP     Component Value Date/Time   NA 136 10/20/2014 0051   K 3.6 10/20/2014 0051   CL 105 10/20/2014 0051   CO2 23 10/20/2014 0051   GLUCOSE 126* 10/20/2014 0051   BUN 19 10/20/2014 0051   CREATININE 1.01* 10/20/2014 0051   CALCIUM 8.7* 10/20/2014 0051   PROT 7.0 10/16/2014 2013   ALBUMIN 3.6 10/16/2014 2013   AST 22 10/16/2014 2013   ALT 33 10/16/2014 2013   ALKPHOS 71 10/16/2014 2013   BILITOT 0.7 10/16/2014 2013   GFRNONAA 55* 10/20/2014 0051   GFRAA >60 10/20/2014 0051   Lipase     Component Value Date/Time   LIPASE 31 10/16/2014 2013       Studies/Results: No results found.  Anti-infectives: Anti-infectives    Start     Dose/Rate Route Frequency Ordered Stop   10/20/14 1200  vancomycin (VANCOCIN) IVPB 750 mg/150 ml premix     750 mg 150 mL/hr over 60 Minutes Intravenous Every 12 hours 10/20/14 0215     10/17/14 0900  fluconazole (DIFLUCAN) IVPB 400 mg    Comments:  Pharm to confirm dosing   400 mg 100 mL/hr over 120 Minutes Intravenous Every 24 hours 10/17/14 0748 10/24/14 0859   10/17/14 0600  piperacillin-tazobactam (ZOSYN) IVPB 3.375 g     3.375 g 12.5 mL/hr over 240 Minutes Intravenous Every 8 hours 10/17/14 0014     10/17/14 0130  vancomycin (VANCOCIN) IVPB 1000 mg/200 mL premix  Status:  Discontinued     1,000 mg 200 mL/hr over 60 Minutes Intravenous Every 24 hours 10/17/14 0124 10/20/14 0214   10/16/14 2115  piperacillin-tazobactam (ZOSYN) IVPB 3.375 g     3.375 g 100 mL/hr over 30 Minutes Intravenous  Once 10/16/14 2114 10/16/14 2215   10/16/14 2100  piperacillin-tazobactam (ZOSYN) IVPB 4.5 g  Status:  Discontinued     4.5 g 200 mL/hr over 30 Minutes Intravenous  Once 10/16/14 2054 10/16/14 2111  Assessment/Plan POD #5, s/p open repair of perforated gastric ulcer with biopsy -IV prn pain meds, cont robaxin, IV tylenol, ice, and abdominal binder -Cont WD BID dressing changes -D/c foley -Monitor drain output carefully with initiation of clears -D/c NG tube and start sips of clears, minimal flatus thus far -PT eval and mobilization - PT recommending HH and 24hr supervision -Cont pulm toileting  -Add oral meds tomorrow if doing well -On floor -Vanc/Zosyn Day #5, Diflucan Day #4. Continue on Vanc, following cultures given GPC on gram stain. Hopefully can de-escilate soon. -Protonix BID Respiratory failure -extubated 9/19, on 2L [place] today and working some, but in no distress -cont with nebs prn and O2 prn -Medicine following Peaked T waves -T waves were peaked  yesterday morning. Medicine following. She denies chest pain. K normal. Will need OP cardiology referral. Lung nodule -will need outpatient follow up to have this evaluated. DVT Prophylaxis -SCDs/Lovenox Disp -F/u with Dr. Grandville Silos will be arranged, arrangements for [facility] services made     LOS: 4 days    Nat Christen 10/20/2014, 9:21 AM Pager: [phone]

## 2014-10-20 NOTE — Progress Notes (Signed)
ANTIBIOTIC CONSULT NOTE - FOLLOW UP  Pharmacy Consult for vancomycin Indication: sepsis 2/2 peritonitis   Labs:  Recent Labs  10/18/14 0310 10/18/14 1123 10/19/14 0030 10/20/14 0051  WBC 19.4*  --  17.7* 14.2*  HGB 11.0*  --  11.5* 11.8*  PLT 241  --  264 265  CREATININE 1.22* 1.13* 1.11* 1.01*   Estimated Creatinine Clearance: 42.9 mL/min (by C-G formula based on Cr of 1.01).  Recent Labs  10/20/14 0051  VANCOTROUGH 9*     Microbiology: Recent Results (from the past 720 hour(s))  Body fluid culture     Status: None   Collection Time: 10/16/14 10:55 PM  Result Value Ref Range Status   Specimen Description FLUID PERITONEAL  Final   Special Requests NONE  Final   Gram Stain   Final    DIRECT SMEAR ABUNDANT WBC PRESENT,BOTH PMN AND MONONUCLEAR FEW GRAM POSITIVE COCCI IN PAIRS IN CLUSTERS CRITICAL RESULT CALLED TO, READ BACK BY AND VERIFIED WITH: S.MYERS,RN [id number] 10/17/14 M.CAMPBELL CONFIRMED BY B.MARTIN    Culture MULTIPLE ORGANISMS PRESENT, NONE PREDOMINANT  Final   Report Status 10/18/2014 FINAL  Final  Anaerobic culture     Status: None (Preliminary result)   Collection Time: 10/16/14 10:55 PM  Result Value Ref Range Status   Specimen Description FLUID PERITONEAL  Final   Special Requests NONE  Final   Gram Stain   Final    ABUNDANT WBC PRESENT,BOTH PMN AND MONONUCLEAR FEW GRAM POSITIVE COCCI IN PAIRS IN CLUSTERS    Culture   Final    NO ANAEROBES ISOLATED; CULTURE IN PROGRESS FOR 5 DAYS   Report Status PENDING  Incomplete  Anaerobic culture     Status: None (Preliminary result)   Collection Time: 10/16/14 11:10 PM  Result Value Ref Range Status   Specimen Description TISSUE STOMA  Final   Special Requests NONE  Final   Gram Stain   Final    NO WBC SEEN NO SQUAMOUS EPITHELIAL CELLS SEEN NO ORGANISMS SEEN Performed at [facility]    Culture   Final    NO ANAEROBES ISOLATED; CULTURE IN PROGRESS FOR 5 DAYS Performed at [facility]    Report Status PENDING  Incomplete  Tissue culture     Status: None (Preliminary result)   Collection Time: 10/16/14 11:10 PM  Result Value Ref Range Status   Specimen Description TISSUE STOMA  Final   Special Requests NONE  Final   Gram Stain   Final    NO WBC SEEN NO SQUAMOUS EPITHELIAL CELLS SEEN NO ORGANISMS SEEN Performed at [facility]    Culture   Final    NO GROWTH 2 DAYS Performed at [facility]    Report Status PENDING  Incomplete  MRSA PCR Screening     Status: None   Collection Time: 10/17/14 12:20 AM  Result Value Ref Range Status   MRSA by PCR NEGATIVE NEGATIVE Final    Comment:        The GeneXpert MRSA Assay (FDA approved for NASAL specimens only), is one component of a comprehensive MRSA colonization surveillance program. It is not intended to diagnose MRSA infection nor to guide or monitor treatment for MRSA infections.   Culture, blood (single)     Status: None (Preliminary result)   Collection Time: 10/17/14 12:25 AM  Result Value Ref Range Status   Specimen Description BLOOD RIGHT HAND  Final   Special Requests IN PEDIATRIC BOTTLE [facility]  Final  Culture NO GROWTH 2 DAYS  Final   Report Status PENDING  Incomplete     Assessment: 69yo female subtherapeutic on vancomycin with initial dosing for sepsis 2/2 peritonitis d/t perforated ulcer.  Goal of Therapy:  Vancomycin trough level 15-20 mcg/ml  Plan:  Will change vanc to 750mg  IV Q12H for calculcated trough ~18 and continue to monitor.  Wynona Neat, PharmD, BCPS  10/20/2014,2:15 AM

## 2014-10-20 NOTE — Care Management Note (Signed)
Case Management Note  Patient Details  Name: LETASHA KERSHAW MRN: [id number] Date of Birth: 08-Dec-1945  Subjective/Objective:                 Date: 10-20-14 Thursday Spoke with patient at the bedside along with husband [name] [phone]) [phone]. Introduced self as Tourist information centre manager and explained role in discharge planning and how to be reached. Verified patient lives in [place] in a 1 story home, with spouse. Verified patient anticipates to go home with family, at time of discharge and will have  part-time supervision by family friends neighbors at this time to best of their knowledge. Patient has DME walker . Expressed potential need for no other DME. Patient denied needing help with their medication. Patient drives and is driven by husband to MD appointments. Verified patient has PCP Dr. Linton Flemings Patient states they currently receive [facility] services through no one.  Patient was provided choice and selected [facility] for home health needs if they arise. Referral made to [facility] for [facility] RN PT OT.  Plan: CM will continue to follow for discharge planning and [facility] resources.   Carles Collet RN BSN CM [phone]    Action/Plan:   Expected Discharge Date:                  Expected Discharge Plan:  [place]  In-House Referral:     Discharge planning Services  CM Consult  Post Acute Care Choice:    Choice offered to:     DME Arranged:    DME Agency:     HH Arranged:    HH Agency:     Status of Service:  In process, will continue to follow  Medicare Important Message Given:  Yes-second notification given Date Medicare IM Given:    Medicare IM give by:    Date Additional Medicare IM Given:    Additional Medicare Important Message give by:     If discussed at [place] of Stay Meetings, dates discussed:    Additional Comments:  Carles Collet, RN 10/20/2014, 3:28 PM

## 2014-10-20 NOTE — Consult Note (Addendum)
CARDIOLOGY CONSULT NOTE   Patient ID: Sally Douglas MRN: [id number], DOB/AGE: 1945/12/22   Admit date: 10/16/2014 Date of Consult: 10/20/2014 Reason for  Consult: Chest Pain   Primary Physician: Nyoka Cowden, MD Primary Cardiologist: New  HPI: Sally Douglas is a 69 y.o. female with past medical history of HTN, Arthritis, and Tobacco Abuse who presented to [facility] ED on 10/16/2014 for upper abdominal pain and found to have free peritoneal air and a perforated gastric ulcer. She underwent emergent exploratory laparotomy and open repair of the perforated ulcer that night. Of note, an 20mm right lower lobe lung nodule was also found on her imaging and she is planning to have this followed as an outpatient.  Of note, she was started on ABX for sepsis secondary to the perforated ulcer and remains on Vancomycin and Zosyn at this time.  On 10/18/2014, 2-days post-op, peaked T-waves in the lateral leads were noted on the patient's EKG which was new from previous tracings. Potassium checked and normal at that time. She was without any chest pain. Cyclic troponin values were at 0.28, 0.28, and 0.33 respectively.   An echocardiogram was obtained on 10/19/2014 which showed an EF of 55% to 60% and akinesis of the apicalanteroseptal and apical myocardium, consistent with an apical MI. Grade 1 Diastolic Dysfunction was also noted.  She has remained without chest pain while hospitalized and reports having none before her admission. Today, she reports some left upper quadrant tenderness near her incision which is tender to palpation.  She reports having never seen a cardiologist or having any cardiac work-up in the past. Her BP is monitored by her PCP and she reports it is well-controlled with medications. She is very active at home with gardening, mowing the yard, and doing house work. She denies any dyspnea on exertion or anginal equivalents when doing these activities.    Problem List Past  Medical History  Diagnosis Date  . Insomnia     takes Ambien nightly  . Obesity   . Menopausal syndrome   . Tobacco abuse   . Low back pain   . Bronchitis     hx of-many yrs ago  . Abnormal finding on Pap smear     hx abnl pap  . Hypertension     takes Diltiazem and HCTZ daily  . Constipation     takes Miralax daily  . Arthritis   . Joint pain   . Joint swelling     Past Surgical History  Procedure Laterality Date  . Tonsillectomy    . Dilation and curettage of uterus    . Ventral hernia repair  01/24/2011    Procedure: LAPAROSCOPIC VENTRAL HERNIA;  Surgeon: Adin Hector, MD;  Location: [facility];  Service: General;  Laterality: N/A;  with Mesh  . Maximum access (mas)posterior lumbar interbody fusion (plif) 1 level N/A 11/04/2012    Procedure: Lumbar four-five Maximum access posterior lumbar interbody fusion with Nuvasive;  Surgeon: Eustace Moore, MD;  Location: [facility];  Service: Neurosurgery;  Laterality: N/A;  Lumbar four-five Maximum access posterior lumbar interbody fusion with Nuvasive  . Laparotomy N/A 10/16/2014    Procedure: EXPLORATORY LAPAROTOMY, REPAIR OF GASTRIC ULCER;  Surgeon: Georganna Skeans, MD;  Location: [facility];  Service: General;  Laterality: N/A;     Allergies Allergies  Allergen Reactions  . K-Dur [Potassium Chloride] Swelling    Face Swelling  . Nsaids     H/o PUD with perforation  Inpatient Medications . acetaminophen  1,000 mg Intravenous 4 times per day  . antiseptic oral rinse  7 mL Mouth Rinse QID  . chlorhexidine gluconate  15 mL Mouth Rinse BID  . enoxaparin (LOVENOX) injection  40 mg Subcutaneous Q24H  . fluconazole (DIFLUCAN) IV  400 mg Intravenous Q24H  . gi cocktail  30 mL Oral Once  . hydrALAZINE  25 mg Oral 3 times per day  . insulin aspart  0-24 Units Subcutaneous 6 times per day  . metoprolol tartrate  50 mg Oral BID  . pantoprazole (PROTONIX) IV  40 mg Intravenous Q12H  . piperacillin-tazobactam (ZOSYN)  IV  3.375 g  Intravenous Q8H  . vancomycin  750 mg Intravenous Q12H    Family History Family History  Problem Relation Age of Onset  . Cancer Brother     renal ,also sister  . COPD Brother   . Hypertension Brother   . Diabetes Brother   . Kidney disease Brother   . Diabetes Brother     and sister  . Breast cancer      sister  . Kidney disease    . Cancer Mother     Breast Cancer  . Diabetes Mother   . Hypertension Mother   . Glaucoma Mother   . Diabetes Sister   . Hypertension Sister   . Heart disease Sister      Social History Social History   Social History  . Marital Status: Married    Spouse Name: N/A  . Number of Children: N/A  . Years of Education: N/A   Occupational History  . Not on file.   Social History Main Topics  . Smoking status: Current Every Day Smoker -- 0.25 packs/day for 25 years    Types: Cigarettes  . Smokeless tobacco: Never Used     Comment: Electronic Cigarettes  . Alcohol Use: No     Comment: occassional  . Drug Use: No  . Sexual Activity: Not Currently    Birth Control/ Protection: Post-menopausal   Other Topics Concern  . Not on file   Social History Narrative   gavidia 1   Para 1    aborta 0     Review of Systems General:  No chills, fever, night sweats or weight changes.  Cardiovascular:  No chest pain, dyspnea on exertion, edema, orthopnea, palpitations, paroxysmal nocturnal dyspnea. Dermatological: No rash, lesions/masses Respiratory: No cough, dyspnea Urologic: No hematuria, dysuria Abdominal:   No nausea, vomiting, diarrhea, bright red blood per rectum, melena, or hematemesis. Positive for abdominal pain. Neurologic:  No visual changes, wkns, changes in mental status. All other systems reviewed and are otherwise negative except as noted above.  Physical Exam Blood pressure 167/83, pulse 112, temperature 97.9 F (36.6 C), temperature source Oral, resp. rate 16, height 4\' 8"  (1.422 m), weight 165 lb (74.844 kg), SpO2 96 %.    General: Well-nourished Caucasian female in NAD Psych: Normal affect. Neuro: Alert and oriented X 3. Moves all extremities spontaneously. HEENT: Normal  Neck: Supple without bruits or JVD. Lungs:  Resp regular and unlabored, CTA without wheezing or rales. Heart: RRR no s3, s4, or murmurs. Abdomen: Soft, non-distended, BS + x 4, dressing in place along midline of upper abdomen. Tender to palpation in left upper quadrant Extremities: No clubbing, cyanosis or edema. DP/PT/Radials 2+ and equal bilaterally.  Labs   Recent Labs  10/18/14 0903 10/18/14 1443 10/18/14 2025  TROPONINI 0.28* 0.28* 0.33*   Lab Results  Component Value Date  WBC 14.2* 10/20/2014   HGB 11.8* 10/20/2014   HCT 36.0 10/20/2014   MCV 91.1 10/20/2014   PLT 265 10/20/2014    Recent Labs Lab 10/16/14 2013  10/20/14 0051  NA 137  < > 136  K 3.5  < > 3.6  CL 101  < > 105  CO2 22  < > 23  BUN 32*  < > 19  CREATININE 1.16*  < > 1.01*  CALCIUM 9.0  < > 8.7*  PROT 7.0  --   --   BILITOT 0.7  --   --   ALKPHOS 71  --   --   ALT 33  --   --   AST 22  --   --   GLUCOSE 148*  < > 126*  < > = values in this interval not displayed. Lab Results  Component Value Date   CHOL 156 11/29/2013   HDL 56.30 11/29/2013   LDLCALC 74 11/29/2013   TRIG 66 10/17/2014    Radiology/Studies Ct Angio Chest Aorta W/cm &/or Wo/cm: 10/16/2014   CLINICAL DATA:  Severe abdominal and chest pain  EXAM: CT ANGIOGRAPHY CHEST, ABDOMEN AND PELVIS  TECHNIQUE: Multidetector CT imaging through the chest, abdomen and pelvis was performed using the standard protocol during bolus administration of intravenous contrast. Multiplanar reconstructed images and MIPs were obtained and reviewed to evaluate the vascular anatomy.  CONTRAST:  100 cc Omnipaque 350 intravenous  COMPARISON:  None.  FINDINGS: CTA CHEST FINDINGS  THORACIC INLET/BODY WALL:  No acute abnormality.  MEDIASTINUM:  No aortic intramural hematoma or dissection. No aortic aneurysm.  Mild atheromatous changes, greatest along the aortic arch. No cardiomegaly or pericardial effusion. No lymphadenopathy.  LUNG WINDOWS:  Bibasilar atelectasis. Lobulated pulmonary nodule in the right lower lobe, nearly touching the major fissure, measuring 11 mm (measured coronally).  OSSEOUS:  See below  Review of the MIP images confirms the above findings.  CTA ABDOMEN AND PELVIS FINDINGS  Abdominal wall: 3 small midline abdominal wall hernias containing fat. The upper hernia also contains pneumoperitoneum.  Hepatobiliary: Liver cysts, up to 32 mm in segment 4B. Possible hepatic steatosis.Layering high density within the gallbladder could be small stones or sludge. No acute inflammatory changes.  Pancreas: Unremarkable.  Spleen: Unremarkable.  Adrenals/Urinary Tract: Negative adrenals. No hydronephrosis or stone. Early excretion of contrast, reaching the right bladder, likely test bolus. 26 mm right renal cyst. Left renal sinus cysts. Unremarkable bladder.  Reproductive:Incidental calcified fundal fibroid measuring 17 mm.  Stomach/Bowel: Pneumoperitoneum consistent with bowel perforation. Accumulation of gas in the upper abdomen and a visible mucosa defect in the ventral gastric body suggest a gastric ulcer perforation. There is mild colonic diverticulosis distally but no surrounding pneumoperitoneum. Reactive ascites and omental fat edema. No obstruction. No appendicitis.  Vascular/Lymphatic: Aortic atherosclerosis without aneurysm or dissection. Moderate stenosis of the right renal artery proximally. No other visceral branch stenosis. No mass or adenopathy.  Musculoskeletal: Diffuse disc degeneration, status post L4-5 discectomy with posterior rod and pedicle screw fixation. Remote appearing T5, T6, T8 and T11 superior endplate fractures.  Critical Value/emergent results and pulmonary nodule were called by telephone at the time of interpretation on 10/16/2014 at 8:53 pm to Dr. Dalia Heading , who verbally  acknowledged these results.  Review of the MIP images confirms the above findings.  IMPRESSION: 1. Bowel perforation with moderate pneumoperitoneum, strongly favor a gastric ulcer. 2. 11 mm right lower lobe pulmonary nodule, concerning for malignancy in this smoker. The nodule is at  the limits of PET resolution. Could attempt PET or short followup chest CT (on the order of 3 months). 3. No acute aortic findings. 4. Moderate right renal artery stenosis. 5. Gallbladder sludge or small calculi.   Electronically Signed   By: Monte Fantasia M.D.   On: 10/16/2014 21:07   ECG: Sinus rhythm with rate in 60's. T-wave inversion noted in I, AVL, V4, V5, and V6.   ECHO: 10/19/2014 Study Conclusions - Left ventricle: The cavity size was normal. Wall thickness was increased in a pattern of mild LVH. Systolic function was normal. The estimated ejection fraction was in the range of 55% to 60%. Akinesis of the apicalanteroseptal and apical myocardium. Doppler parameters are consistent with abnormal left ventricular relaxation (grade 1 diastolic dysfunction).  Impressions: - The findings are consistent with an apical MI .   ASSESSMENT AND PLAN  1. Elevated Troponin - peaked T-waves noted on post-op Day 2 (10/18/2014). Cyclic troponin values have been 0.28, 0.28, and 0.33 respectively. Echo on 10/19/2014 showed akinesis of the apical anteroseptal and apical myocardium, consistent with an apical MI.  - no ASA or Heparin due to recent gastic perforation. - IV Metoprolol added on 10/19/2014. Has now been switched to PO Metoprolol 50mg  BID. - Consider adding ACE-I once creatinine improves. - Lipid panel ordered. - May need cardiac catheterization and possible percutaneous intervention at some point. However, in the setting of her recent abdominal surgery and current wound with drain this will need to be delayed. If potentially several weeks out, a repeat echocardiogram could be performed before a cath  to see if Takotsubo cardiomyopathy is a potential etiology of her recent troponin elevation and echo results.  2. HTN - BP has been elevated at 143/72 - 182/102 in the past 24 hours. - Hydralazine 25mg  TID started today along with IV Metoprolol being switched to PO dosing.   3. Perforated Gastric Ulcer - Is now 4 days post-op. Doing small amounts of clear liquid at this time. - IV medications switched to PO today.  4. Right Lower Lobe Nodule, 35mm - will need outpatient follow-up   Signed, Erma Heritage, PA-C 10/20/2014, 3:21 PM Pager: [phone]  I have examined the patient and reviewed assessment and plan and discussed with patient.   I personally reviewed the echo images.  Agree with above as stated.  Patient with low level troponins, T wave changes and apical wall motion abnormality all in the setting of severe stress.  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 Broken heart syndrome) which is nonischemic and occurs in the setting of no significant CAD.  She is not a good candidate for anticoagulation,PCI, and antiplatelet therapy at this time given the recent surgery.  SHe just walked with PT and had no chest pain even with a HR of 120.  I would plan repeat echo in 4 weeks.  If Wall motion abnormality has resolved, then no further w/u needed.  If it persists, then she would bneed cath at some point.  If this was a plaque rupture MI with occluded LAD causing apical wall motion abnormality, troponin levels should have been much higher.   For now, medical therapy for LV dysfunction. Hydralazine and metoprolol started.  If renal functioon stays stable, would add ACE-inhibitor tomorrow, lisinopril 5 mg daily and quickly uptitrate as tolerated. Add Imdur today.    Explained to the patient and her son who was in the room.    VARANASI,JAYADEEP S.

## 2014-10-20 NOTE — Progress Notes (Signed)
Physical Therapy Treatment Patient Details Name: Sally Douglas MRN: [id number] DOB: [birth year] Today's Date: 10/20/2014    History of Present Illness Pt is a 69 y/o female who presented to the ED with sudden onset of abdominal pain and was found to have a perforated gastric ulcer. She underwent an exploatory laparotomy with repair of gastric perforation on 10/17/14.     PT Comments    Pt is progressing well with her mobility.  She was able to ambulate in the hallway with RW supervision.  Pt's HR increased to 127 and O2 sats were 98% on RA during gait.  PT will continue to follow acutely.    Follow Up Recommendations  Home health PT;Supervision for mobility/OOB     Equipment Recommendations  Other (comment) (tub bench)    Recommendations for Other Services   NA     Precautions / Restrictions Precautions Precautions: Other (comment) Precaution Comments: monitor HR during mobility    Mobility  Bed Mobility               General bed mobility comments: Pt seated in recliner chair  Transfers Overall transfer level: Needs assistance Equipment used: Rolling walker (2 wheeled) Transfers: Sit to/from Stand Sit to Stand: Supervision         General transfer comment: supervision for safety due to reliance on arms for transitions.   Ambulation/Gait Ambulation/Gait assistance: Min guard Ambulation Distance (Feet): 150 Feet Assistive device: Rolling walker (2 wheeled) Gait Pattern/deviations: Step-through pattern;Shuffle Gait velocity: decreased Gait velocity interpretation: Below normal speed for age/gender General Gait Details: Pt with slow, but steady gait with RW.  Supervision for safety and cardiac monitoring. HR increased to 127 during gait and O2 sats stable on RA at 98% despite DOE 3/4 with gait.           Balance Overall balance assessment: Needs assistance Sitting-balance support: Feet supported;No upper extremity supported Sitting balance-Leahy Scale:  Good     Standing balance support: Bilateral upper extremity supported Standing balance-Leahy Scale: Poor                      Cognition Arousal/Alertness: Awake/alert Behavior During Therapy: WFL for tasks assessed/performed Overall Cognitive Status: Within Functional Limits for tasks assessed                      Exercises General Exercises - Lower Extremity Long Arc Quad: AROM;Both;10 reps;Seated Hip ABduction/ADduction: AROM;Both;10 reps;Seated Hip Flexion/Marching: AROM;Both;10 reps;Seated Toe Raises: AROM;Both;10 reps;Seated Heel Raises: AROM;Both;10 reps;Seated Other Exercises Other Exercises: scapular retractions, posterior shoulder rolls x 10 each bil.        Pertinent Vitals/Pain Pain Assessment: Faces Faces Pain Scale: Hurts little more Pain Location: left lower quadrant Pain Descriptors / Indicators: Pressure;Sharp Pain Intervention(s): Limited activity within patient's tolerance;Monitored during session;Repositioned           PT Goals (current goals can now be found in the care plan section) Acute Rehab PT Goals Patient Stated Goal: to go home Progress towards PT goals: Progressing toward goals    Frequency  Min 3X/week    PT Plan Current plan remains appropriate       End of Session   Activity Tolerance: Patient limited by fatigue;Treatment limited secondary to medical complications (Comment);Patient limited by pain (limited by DOE and increased HR) Patient left: in chair;with call bell/phone within reach;with family/visitor present     Time: 4098-1191 PT Time Calculation (min) (ACUTE ONLY): 29 min  Charges:  $Gait  Training: 8-22 mins $Therapeutic Exercise: 8-22 mins                      Rebecca B. [place], [place], DPT [phone]   10/20/2014, 5:08 PM

## 2014-10-20 NOTE — Progress Notes (Addendum)
[facility] PROGRESS NOTE  Sally Douglas GMW:[id number] DOB: [birth year] DOA: 10/16/2014 PCP: Nyoka Cowden, MD   Brief narrative Medial consult for a  69 year old female with history of hypertension, tobacco abuse, and low back pain who presented with acute onset epigastric pain radiating to bilateral shoulder. She was septic with significant leukocytosis on presentation (20 5K). CT angiographic chest abdomen and pelvis showed free peritoneal air with fluid consistent with perforated gastric ulcer. Patient taken to OR on 9/19 with exploratory laparotomy with repair of gastric perforation and a JP drain placed in . Patient extubated on 9/20 and transferred to medical floor.    Assessment/Plan: Sepsis secondary to perforated gastric ulcer with peritonitis Status post laparotomy with repair. Continue empiric vancomycin, Zosyn. Fluid culture growing few gram-positive cocci in pairs and clusters. Continue gentle IV hydration and pain control primary team. Continue NG. Patient currently nothing by mouth. Continue IS. WBC improving, afebrile. IV PPI twice a day. -tolerating prn dilaudid. Plan to remove NG and foley today.  NSTEMI Possibly associated with demand ischemia. T-wave inversion in lateral leads noted with troponin peaked at 0.38. 2 d echo with normal EF and comments on possible apical MI.  Patient denies any chest pain symptoms. . Denies any cardiac history or workup done in the past. Cannot use ASA due to gastric perforation. Added metoprolol. Check lipid panel. Cardiology consulted.     Acute kidney injury Mild secondary to sepsis. Improving with IV hydration.  Right lower lobe nodule Incidental finding with 11 mm right lower lobe lung nodule. Given her smoking history she would need a PET scan versus follow-up chest CT in 3 months.  Tobacco abuse Counseled on cessation.  nicotine patch  Shortness of breath Minimize fluid. Improved with IV lasix x1 on  9/21  Uncontrolled hypertension Increase metoprolol dose. Add scheduled hypdralazine.   Diet: Nothing by mouth Prophylaxis: Subcutaneous Lovenox  Code Status: Full code Family Communication: Noneat bedside Disposition Plan: Per primary.      Procedure  Laparotomy with repair of perforated gastric ulcer on 9/18  CT angiogram chest abdomen and pelvis.  Antibiotics:  Vancomycin/Zosyn/Diflucan 9/18--  HPI/Subjective: Seen and examined. abd pain and dyspnea netter.  Objective: Filed Vitals:   10/20/14 1117  BP: 172/90  Pulse:   Temp:   Resp: 18    Intake/Output Summary (Last 24 hours) at 10/20/14 1405 Last data filed at 10/20/14 1255  Gross per 24 hour  Intake 196.67 ml  Output   2470 ml  Net -2273.33 ml   Filed Weights   10/16/14 1838  Weight: 74.844 kg (165 lb)    Exam:   General:  in some pain and discomfort  HEENT: , NG in place, supple neck, moist mucosa  Cardiovascular: Normal S1 and S2, no murmurs rub or gallop  Respiratory: clear  bilaterally  Abdomen: Dressing over laparotomy site clean, absent bowel sounds  Musculoskeletal: Warm, no  edema  CNS: Alert and oriented  Data Reviewed: Basic Metabolic Panel:  Recent Labs Lab 10/17/14 0212 10/18/14 0310 10/18/14 1123 10/19/14 0030 10/20/14 0051  NA 140 135 134* 138 136  K 3.7 4.3 4.0 4.1 3.6  CL 108 105 101 105 105  CO2 23 22 24 23 23   GLUCOSE 182* 189* 127* 108* 126*  BUN 28* 23* 20 20 19   CREATININE 1.27* 1.22* 1.13* 1.11* 1.01*  CALCIUM 8.0* 8.5* 8.5* 9.0 8.7*  MG  --   --  1.8  --   --   PHOS  --   --  4.2  --   --    Liver Function Tests:  Recent Labs Lab 10/16/14 2013  AST 22  ALT 33  ALKPHOS 71  BILITOT 0.7  PROT 7.0  ALBUMIN 3.6    Recent Labs Lab 10/16/14 2013  LIPASE 31   No results for input(s): AMMONIA in the last 168 hours. CBC:  Recent Labs Lab 10/16/14 2013 10/17/14 0212 10/18/14 0310 10/19/14 0030 10/20/14 0051  WBC 24.9* 20.6* 19.4*  17.7* 14.2*  NEUTROABS 21.3*  --   --   --   --   HGB 14.5 12.7 11.0* 11.5* 11.8*  HCT 44.0 39.8 35.2* 35.8* 36.0  MCV 91.5 95.4 93.9 93.0 91.1  PLT 378 292 241 264 265   Cardiac Enzymes:  Recent Labs Lab 10/18/14 0903 10/18/14 1443 10/18/14 2025  TROPONINI 0.28* 0.28* 0.33*   BNP (last 3 results) No results for input(s): BNP in the last 8760 hours.  ProBNP (last 3 results) No results for input(s): PROBNP in the last 8760 hours.  CBG:  Recent Labs Lab 10/19/14 2010 10/20/14 0002 10/20/14 0421 10/20/14 0829 10/20/14 1149  GLUCAP 120* 116* 117* 110* 123*    Recent Results (from the past 240 hour(s))  Body fluid culture     Status: None   Collection Time: 10/16/14 10:55 PM  Result Value Ref Range Status   Specimen Description FLUID PERITONEAL  Final   Special Requests NONE  Final   Gram Stain   Final    DIRECT SMEAR ABUNDANT WBC PRESENT,BOTH PMN AND MONONUCLEAR FEW GRAM POSITIVE COCCI IN PAIRS IN CLUSTERS CRITICAL RESULT CALLED TO, READ BACK BY AND VERIFIED WITH: S.MYERS,RN [id number] 10/17/14 M.CAMPBELL CONFIRMED BY B.MARTIN    Culture MULTIPLE ORGANISMS PRESENT, NONE PREDOMINANT  Final   Report Status 10/18/2014 FINAL  Final  Anaerobic culture     Status: None (Preliminary result)   Collection Time: 10/16/14 10:55 PM  Result Value Ref Range Status   Specimen Description FLUID PERITONEAL  Final   Special Requests NONE  Final   Gram Stain   Final    ABUNDANT WBC PRESENT,BOTH PMN AND MONONUCLEAR FEW GRAM POSITIVE COCCI IN PAIRS IN CLUSTERS    Culture   Final    NO ANAEROBES ISOLATED; CULTURE IN PROGRESS FOR 5 DAYS   Report Status PENDING  Incomplete  Anaerobic culture     Status: None (Preliminary result)   Collection Time: 10/16/14 11:10 PM  Result Value Ref Range Status   Specimen Description TISSUE STOMA  Final   Special Requests NONE  Final   Gram Stain   Final    NO WBC SEEN NO SQUAMOUS EPITHELIAL CELLS SEEN NO ORGANISMS SEEN Performed at [facility]    Culture   Final    NO ANAEROBES ISOLATED; CULTURE IN PROGRESS FOR 5 DAYS Performed at [facility]    Report Status PENDING  Incomplete  Tissue culture     Status: None   Collection Time: 10/16/14 11:10 PM  Result Value Ref Range Status   Specimen Description TISSUE STOMA  Final   Special Requests NONE  Final   Gram Stain   Final    NO WBC SEEN NO SQUAMOUS EPITHELIAL CELLS SEEN NO ORGANISMS SEEN Performed at [facility]    Culture   Final    NO GROWTH 3 DAYS Performed at [facility]    Report Status 10/20/2014 FINAL  Final  MRSA PCR Screening     Status: None   Collection Time:  10/17/14 12:20 AM  Result Value Ref Range Status   MRSA by PCR NEGATIVE NEGATIVE Final    Comment:        The GeneXpert MRSA Assay (FDA approved for NASAL specimens only), is one component of a comprehensive MRSA colonization surveillance program. It is not intended to diagnose MRSA infection nor to guide or monitor treatment for MRSA infections.   Culture, blood (single)     Status: None (Preliminary result)   Collection Time: 10/17/14 12:25 AM  Result Value Ref Range Status   Specimen Description BLOOD RIGHT HAND  Final   Special Requests IN PEDIATRIC BOTTLE 2CC  Final   Culture NO GROWTH 2 DAYS  Final   Report Status PENDING  Incomplete     Studies: No results found.  Scheduled Meds: . acetaminophen  1,000 mg Intravenous 4 times per day  . antiseptic oral rinse  7 mL Mouth Rinse QID  . chlorhexidine gluconate  15 mL Mouth Rinse BID  . enoxaparin (LOVENOX) injection  40 mg Subcutaneous Q24H  . fluconazole (DIFLUCAN) IV  400 mg Intravenous Q24H  . hydrALAZINE  10 mg Intravenous 3 times per day  . insulin aspart  0-24 Units Subcutaneous 6 times per day  . metoprolol  5 mg Intravenous Q12H  . pantoprazole (PROTONIX) IV  40 mg Intravenous Q12H  . piperacillin-tazobactam (ZOSYN)  IV  3.375 g Intravenous Q8H  . vancomycin  750 mg Intravenous Q12H    Continuous Infusions: . sodium chloride 50 mL/hr at 10/20/14 0427      Time spent: 25 minutes    DHUNGEL, NISHANT  [facility] Pager [phone] If 7PM-7AM, please contact night-coverage at [email or web address], password [facility] 10/20/2014, 2:05 PM  LOS: 4 days

## 2014-10-20 NOTE — Progress Notes (Signed)
When changing abd dressing noticed small 10% black tissue at 9 o-clock. Other tissue pink.

## 2014-10-20 NOTE — Progress Notes (Signed)
MD Dhungel paged, patient's BP continues to remain high (SBP over 150) despite giving scheduled and PRN blood pressure medications.

## 2014-10-21 ENCOUNTER — Other Ambulatory Visit: Payer: Self-pay | Admitting: Cardiology

## 2014-10-21 DIAGNOSIS — I5189 Other ill-defined heart diseases: Secondary | ICD-10-CM | POA: Insufficient documentation

## 2014-10-21 DIAGNOSIS — R1013 Epigastric pain: Secondary | ICD-10-CM

## 2014-10-21 DIAGNOSIS — I5181 Takotsubo syndrome: Secondary | ICD-10-CM

## 2014-10-21 DIAGNOSIS — I429 Cardiomyopathy, unspecified: Secondary | ICD-10-CM

## 2014-10-21 DIAGNOSIS — R109 Unspecified abdominal pain: Secondary | ICD-10-CM | POA: Insufficient documentation

## 2014-10-21 DIAGNOSIS — I428 Other cardiomyopathies: Secondary | ICD-10-CM | POA: Insufficient documentation

## 2014-10-21 LAB — LIPID PANEL
CHOLESTEROL: 158 mg/dL (ref 0–200)
HDL: 36 mg/dL — ABNORMAL LOW (ref >40)
LDL Cholesterol: 87 mg/dL (ref 0–99)
TRIGLYCERIDES: 175 mg/dL — AB (ref <150)
Total CHOL/HDL Ratio: 4.4 RATIO
VLDL: 35 mg/dL (ref 0–40)

## 2014-10-21 LAB — GLUCOSE, CAPILLARY
GLUCOSE-CAPILLARY: 113 mg/dL — AB (ref 65–99)
GLUCOSE-CAPILLARY: 112 mg/dL — AB (ref 65–99)
GLUCOSE-CAPILLARY: 117 mg/dL — AB (ref 65–99)
Glucose-Capillary: 106 mg/dL — ABNORMAL HIGH (ref 65–99)
Glucose-Capillary: 115 mg/dL — ABNORMAL HIGH (ref 65–99)
Glucose-Capillary: 155 mg/dL — ABNORMAL HIGH (ref 65–99)

## 2014-10-21 MED ORDER — HYDRALAZINE HCL 50 MG PO TABS
50.0000 mg | ORAL_TABLET | Freq: Three times a day (TID) | ORAL | Status: DC
  Administered 2014-10-21 – 2014-10-23 (×7): 50 mg via ORAL
  Filled 2014-10-21 (×7): qty 1

## 2014-10-21 MED ORDER — HYDROCODONE-ACETAMINOPHEN 10-325 MG PO TABS
1.0000 | ORAL_TABLET | ORAL | Status: DC | PRN
  Administered 2014-10-21 – 2014-10-23 (×9): 2 via ORAL
  Administered 2014-10-23: 1 via ORAL
  Administered 2014-10-23 (×3): 2 via ORAL
  Filled 2014-10-21 (×13): qty 2

## 2014-10-21 MED ORDER — DOCUSATE SODIUM 100 MG PO CAPS
100.0000 mg | ORAL_CAPSULE | Freq: Two times a day (BID) | ORAL | Status: DC
  Administered 2014-10-21 – 2014-10-23 (×5): 100 mg via ORAL
  Filled 2014-10-21 (×6): qty 1

## 2014-10-21 MED ORDER — NORTRIPTYLINE HCL 25 MG PO CAPS
25.0000 mg | ORAL_CAPSULE | Freq: Every day | ORAL | Status: DC
  Administered 2014-10-21 – 2014-10-22 (×2): 25 mg via ORAL
  Filled 2014-10-21 (×2): qty 1

## 2014-10-21 MED ORDER — ZOLPIDEM TARTRATE 5 MG PO TABS: 5.0000 mg | ORAL_TABLET | Freq: Every evening | ORAL | Status: DC | PRN

## 2014-10-21 MED ORDER — FLUCONAZOLE 100 MG PO TABS
400.0000 mg | ORAL_TABLET | Freq: Every day | ORAL | Status: AC
  Administered 2014-10-22 – 2014-10-23 (×2): 400 mg via ORAL
  Filled 2014-10-21 (×2): qty 4

## 2014-10-21 MED ORDER — CHLORHEXIDINE GLUCONATE 0.12 % MT SOLN
15.0000 mL | Freq: Two times a day (BID) | OROMUCOSAL | Status: DC
  Administered 2014-10-21 – 2014-10-23 (×4): 15 mL via OROMUCOSAL
  Filled 2014-10-21 (×4): qty 15

## 2014-10-21 MED ORDER — LISINOPRIL 5 MG PO TABS
5.0000 mg | ORAL_TABLET | Freq: Every day | ORAL | Status: DC
  Administered 2014-10-21 – 2014-10-23 (×3): 5 mg via ORAL
  Filled 2014-10-21 (×4): qty 1

## 2014-10-21 MED ORDER — METHOCARBAMOL 500 MG PO TABS: 1000.0000 mg | ORAL_TABLET | Freq: Three times a day (TID) | ORAL | Status: DC | PRN

## 2014-10-21 MED ORDER — LEVOFLOXACIN 750 MG PO TABS
750.0000 mg | ORAL_TABLET | ORAL | Status: DC
  Administered 2014-10-21 – 2014-10-23 (×2): 750 mg via ORAL
  Filled 2014-10-21 (×2): qty 1

## 2014-10-21 NOTE — Progress Notes (Signed)
Subjective:  abd pain today   Objective: Vital signs in last 24 hours: Temp:  [97.9 F (36.6 C)-99 F (37.2 C)] 99 F (37.2 C) (09/23 0507) Pulse Rate:  [84-127] 84 (09/23 0507) Resp:  [16-20] 20 (09/23 0507) BP: (157-174)/(75-100) 157/75 mmHg (09/23 0507) SpO2:  [96 %-99 %] 97 % (09/23 0507) Weight change:  Last BM Date: 10/20/14 Intake/Output from previous day: 09/22 0701 - 09/23 0700 In: 1011.7 [I.V.:711.7; IV Piggyback:300] Out: 670 [Urine:500; Emesis/NG output:110; Drains:60] Intake/Output this shift:    PE: General:Pleasant affect, NAD Skin:Warm and dry, brisk capillary refill HEENT:normocephalic, sclera clear, mucus membranes moist,  Heart:S1S2 RRR without murmur, gallup, rub or click Lungs:clear without rales, rhonchi, or wheezes QJJ:HERD, + tender, + BS, do not palpate liver spleen or masses Ext:no lower ext edema, 2+ pedal pulses, 2+ radial pulses Neuro:alert and oriented, MAE, follows commands, + facial symmetry Tele:  SR with PVCs  Some ST Lab Results:  Recent Labs  10/19/14 0030 10/20/14 0051  WBC 17.7* 14.2*  HGB 11.5* 11.8*  HCT 35.8* 36.0  PLT 264 265   BMET  Recent Labs  10/19/14 0030 10/20/14 0051  NA 138 136  K 4.1 3.6  CL 105 105  CO2 23 23  GLUCOSE 108* 126*  BUN 20 19  CREATININE 1.11* 1.01*  CALCIUM 9.0 8.7*    Recent Labs  10/18/14 1443 10/18/14 2025  TROPONINI 0.28* 0.33*    Lab Results  Component Value Date   CHOL 158 10/21/2014   HDL 36* 10/21/2014   LDLCALC 87 10/21/2014   TRIG 175* 10/21/2014   CHOLHDL 4.4 10/21/2014   No results found for: HGBA1C   Lab Results  Component Value Date   TSH 0.59 11/29/2013    Hepatic Function Panel No results for input(s): PROT, ALBUMIN, AST, ALT, ALKPHOS, BILITOT, BILIDIR, IBILI in the last 72 hours.  Recent Labs  10/21/14 0611  CHOL 158   No results for input(s): PROTIME in the last 72 hours.     Studies/Results: ECHO: Study Conclusions  - Left  ventricle: The cavity size was normal. Wall thickness was increased in a pattern of mild LVH. Systolic function was normal. The estimated ejection fraction was in the range of 55% to 60%. Akinesis of the apicalanteroseptal and apical myocardium. Doppler parameters are consistent with abnormal left ventricular relaxation (grade 1 diastolic dysfunction).  Impressions:  - The findings are consistent with an apical MI .  Medications: I have reviewed the patient's current medications. Scheduled Meds: . acetaminophen  1,000 mg Intravenous 4 times per day  . antiseptic oral rinse  7 mL Mouth Rinse QID  . chlorhexidine gluconate  15 mL Mouth Rinse BID  . enoxaparin (LOVENOX) injection  40 mg Subcutaneous Q24H  . fluconazole (DIFLUCAN) IV  400 mg Intravenous Q24H  . hydrALAZINE  25 mg Oral 3 times per day  . insulin aspart  0-24 Units Subcutaneous 6 times per day  . isosorbide mononitrate  30 mg Oral Daily  . lisinopril  5 mg Oral Daily  . metoprolol tartrate  50 mg Oral BID  . pantoprazole (PROTONIX) IV  40 mg Intravenous Q12H  . piperacillin-tazobactam (ZOSYN)  IV  3.375 g Intravenous Q8H  . vancomycin  750 mg Intravenous Q12H   Continuous Infusions: . sodium chloride 50 mL/hr at 10/20/14 0427   PRN Meds:.bisacodyl, hydrALAZINE, HYDROmorphone (DILAUDID) injection, ipratropium-albuterol, methocarbamol (ROBAXIN)  IV, ondansetron **OR** [DISCONTINUED] ondansetron (ZOFRAN) IV, phenol  Assessment/Plan: Active  Problems:   Perforated gastric ulcer   Endotracheally intubated   History of ETT  1. Elevated Troponin - peaked T-waves noted on post-op Day 2 (10/18/2014). Cyclic troponin values have been 0.28, 0.28, and 0.33 respectively. Echo on 10/19/2014 showed akinesis of the apical anteroseptal and apical myocardium, consistent with an apical MI. Dr. 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 Broken heart  syndrome) which is nonischemic and occurs in the setting of no significant CAD. She is not a good candidate for anticoagulation,PCI, and antiplatelet therapy at this time given the recent surgery." - no ASA or Heparin due to recent gastic perforation. I-  ACE-I added monitor cr.  - Lipid panel with LDL of 87 would repeat once recovered from surgery and eating normally. HDL is low at 36 - May need cardiac catheterization and possible percutaneous intervention at some point. However, in the setting of her recent abdominal surgery and current wound with drain this will need to be delayed. If potentially several weeks out, a repeat echocardiogram could be performed before a cath to see if Takotsubo cardiomyopathy is a potential etiology of her recent troponin elevation and echo results.  Per Dr. Cherlynn Kaiser "I would plan repeat echo in 4 weeks. If Wall motion abnormality has resolved, then no further w/u needed. If it persists, then she would bneed cath at some point. If this was a plaque rupture MI with occluded LAD causing apical wall motion abnormality, troponin levels should have been much higher"  2. HTN - BP has been elevated. Today 157/75   - Hydralazine 25mg  TID started today metoprolol 50 BID, and starting lisinopril today at 5 mg. .   3. Perforated Gastric Ulcer - Is now 5 days post-op. Doing small amounts of clear liquid at this time. - IV medications switched to PO yesterday. -+ abd paintoday  4. Right Lower Lobe Nodule, 24mm - will need outpatient follow-up would send to pulmonary clinic for eval.    LOS: 5 days   Time spent with pt. :15 minutes.  Nurse Practitioner Certified Pager [phone] or after 5pm and on weekends call [phone] 10/21/2014, 9:52 AM   I have seen and examined the patient along with [facility] R , NP.  I have reviewed the chart, notes and new data.  I agree with NP's note.  Key new complaints: abdominal pain/bloating Key examination changes: no signs of  CHF, arrhythmia   PLAN: Repeat echo in 2 weeks. If complete recovery of LV function, suspect this was indeed an episode of Takotsubo sd. If not will proceed with workup for CAD.  No new recommendations. Please call with any questions over the weekend.  Sanda Klein, MD, [place] [phone] 10/21/2014, 12:15 PM

## 2014-10-21 NOTE — Progress Notes (Signed)
ANTIBIOTIC CONSULT NOTE - FOLLOW UP  Pharmacy Consult for levaquin Indication: sepsis 2/2 peritonitis   Labs:  Recent Labs  10/19/14 0030 10/20/14 0051  WBC 17.7* 14.2*  HGB 11.5* 11.8*  PLT 264 265  CREATININE 1.11* 1.01*   Estimated Creatinine Clearance: 42.9 mL/min (by C-G formula based on Cr of 1.01).  Recent Labs  10/20/14 0051  VANCOTROUGH 9*     Microbiology: Recent Results (from the past 720 hour(s))  Body fluid culture     Status: None   Collection Time: 10/16/14 10:55 PM  Result Value Ref Range Status   Specimen Description FLUID PERITONEAL  Final   Special Requests NONE  Final   Gram Stain   Final    DIRECT SMEAR ABUNDANT WBC PRESENT,BOTH PMN AND MONONUCLEAR FEW GRAM POSITIVE COCCI IN PAIRS IN CLUSTERS CRITICAL RESULT CALLED TO, READ BACK BY AND VERIFIED WITH: S.MYERS,RN 0932 10/17/14 M.CAMPBELL CONFIRMED BY B.MARTIN    Culture MULTIPLE ORGANISMS PRESENT, NONE PREDOMINANT  Final   Report Status 10/18/2014 FINAL  Final  Anaerobic culture     Status: None (Preliminary result)   Collection Time: 10/16/14 10:55 PM  Result Value Ref Range Status   Specimen Description FLUID PERITONEAL  Final   Special Requests NONE  Final   Gram Stain   Final    ABUNDANT WBC PRESENT,BOTH PMN AND MONONUCLEAR FEW GRAM POSITIVE COCCI IN PAIRS IN CLUSTERS    Culture   Final    NO ANAEROBES ISOLATED; CULTURE IN PROGRESS FOR 5 DAYS   Report Status PENDING  Incomplete  Anaerobic culture     Status: None (Preliminary result)   Collection Time: 10/16/14 11:10 PM  Result Value Ref Range Status   Specimen Description TISSUE STOMA  Final   Special Requests NONE  Final   Gram Stain   Final    NO WBC SEEN NO SQUAMOUS EPITHELIAL CELLS SEEN NO ORGANISMS SEEN Performed at [facility]    Culture   Final    NO ANAEROBES ISOLATED; CULTURE IN PROGRESS FOR 5 DAYS Performed at [facility]    Report Status PENDING  Incomplete  Tissue culture     Status: None   Collection Time: 10/16/14 11:10 PM  Result Value Ref Range Status   Specimen Description TISSUE STOMA  Final   Special Requests NONE  Final   Gram Stain   Final    NO WBC SEEN NO SQUAMOUS EPITHELIAL CELLS SEEN NO ORGANISMS SEEN Performed at [facility]    Culture   Final    NO GROWTH 3 DAYS Performed at [facility]    Report Status 10/20/2014 FINAL  Final  MRSA PCR Screening     Status: None   Collection Time: 10/17/14 12:20 AM  Result Value Ref Range Status   MRSA by PCR NEGATIVE NEGATIVE Final    Comment:        The GeneXpert MRSA Assay (FDA approved for NASAL specimens only), is one component of a comprehensive MRSA colonization surveillance program. It is not intended to diagnose MRSA infection nor to guide or monitor treatment for MRSA infections.   Culture, blood (single)     Status: None (Preliminary result)   Collection Time: 10/17/14 12:25 AM  Result Value Ref Range Status   Specimen Description BLOOD RIGHT HAND  Final   Special Requests IN PEDIATRIC BOTTLE 2CC  Final   Culture NO GROWTH 3 DAYS  Final   Report Status PENDING  Incomplete     Assessment:  69 yo female on day #5 of IV antibiotics for sepsis 2/2 peritonitis d/t perforated ulcer.  Asked by MD to narrow antibiotics to PO Levaquin.  Goal of Therapy:  Renal dose adjustment  Plan:  Levaquin 750 mg PO q48h Will also change Fluconazole to 400mg  PO daily to complete total 7d of therapy.  [employer], Pharm.D., BCPS Clinical Pharmacist Pager [phone] 10/21/2014 12:16 PM

## 2014-10-21 NOTE — Progress Notes (Signed)
Physical Therapy Treatment Patient Details Name: Sally Douglas MRN: [id number] DOB: [birth year] Today's Date: 10/21/2014    History of Present Illness Pt is a 69 y/o female who presented to the ED with sudden onset of abdominal pain and was found to have a perforated gastric ulcer. She underwent an exploatory laparotomy with repair of gastric perforation on 10/17/14.     PT Comments    Pt is progressing well with her mobility.  She was able to go up three steps simulating home entry with some effort, but she is determined to go home.  Pt continues to benefit from acute and f/u PT at discharge.    Follow Up Recommendations  Home health PT;Supervision for mobility/OOB     Equipment Recommendations  Other (comment) (tub bench)    Recommendations for Other Services   NA     Precautions / Restrictions   JP drain   Mobility  Bed Mobility Overal bed mobility: Needs Assistance Bed Mobility: Sit to Supine       Sit to supine: Min assist   General bed mobility comments: Min assist to help support bil legs to get back to supine in bed.  We verbally discussed rolling to side to get up out of bed and why not to sleep propped up very high for a long period of time (scaring into flexion).   Transfers Overall transfer level: Needs assistance Equipment used: 1 person hand held assist Transfers: Sit to/from [facility] Sit to Stand: Min assist Stand pivot transfers: Min assist       General transfer comment: Min assist to stand in stairwell and take a few steps to reach the steps.   Ambulation/Gait Ambulation/Gait assistance: Min assist;+2 safety/equipment Ambulation Distance (Feet): 5 Feet Assistive device: 2 person hand held assist Gait Pattern/deviations: Step-through pattern     General Gait Details: Pt holding to PT and husband's hand to take a few steps from the recliner to the stairs in the stairwell (we did not bring RW)   Stairs Stairs: Yes Stairs  assistance: +2 physical assistance (mod assist) Stair Management: Step to pattern;Forwards (two person hand held assist due to no rails at home) Number of Stairs: 3 General stair comments: Mod assist to support trunk during stepping up.  Bil hands held, however, pt will only have husband at home.           Balance Overall balance assessment: Needs assistance Sitting-balance support: Feet supported;No upper extremity supported Sitting balance-Leahy Scale: Good     Standing balance support: Bilateral upper extremity supported Standing balance-Leahy Scale: Poor                      Cognition Arousal/Alertness: Awake/alert Behavior During Therapy: WFL for tasks assessed/performed Overall Cognitive Status: Within Functional Limits for tasks assessed                             Pertinent Vitals/Pain Pain Assessment: 0-10 Pain Score: 7  Pain Location: abdomen Pain Descriptors / Indicators: Aching;Burning Pain Intervention(s): Limited activity within patient's tolerance;Monitored during session;Repositioned           PT Goals (current goals can now be found in the care plan section) Acute Rehab PT Goals Patient Stated Goal: to go home Progress towards PT goals: Progressing toward goals    Frequency  Min 3X/week    PT Plan Current plan remains appropriate       End  of Session   Activity Tolerance: Patient limited by pain Patient left: in bed;with call bell/phone within reach;with family/visitor present;with nursing/sitter in room     Time: 7944-4619 PT Time Calculation (min) (ACUTE ONLY): 34 min  Charges:  $Gait Training: 8-22 mins $Therapeutic Activity: 8-22 mins                      Rebecca B. Loraine, [place], DPT [phone]   10/21/2014, 5:22 PM

## 2014-10-21 NOTE — Progress Notes (Signed)
[facility] Progress Note  5 Days Post-Op  Subjective: Pt still c/o pain, but thinks she's slowly improving.  Having flatus, had dulcolax.  No N/V, tolerating sips of clears.  No BM yet.  Having gas pains.  Up working with therapy and walking with husband .  Objective: Vital signs in last 24 hours: Temp:  [97.9 F (36.6 C)-99 F (37.2 C)] 99 F (37.2 C) (09/23 0507) Pulse Rate:  [84-127] 84 (09/23 0507) Resp:  [16-20] 20 (09/23 0507) BP: (157-172)/(75-90) 157/75 mmHg (09/23 0507) SpO2:  [96 %-99 %] 97 % (09/23 0507) Last BM Date: 10/20/14  Intake/Output from previous day: 09/22 0701 - 09/23 0700 In: 1011.7 [I.V.:711.7; IV Piggyback:300] Out: 670 [Urine:500; Emesis/NG output:110; Drains:60] Intake/Output this shift:    PE: Gen:  Alert, NAD, pleasant Abd: Soft, ND, tender at incision site, +BS, no HSM, midline wound C/D/I, drain with serosanguinous drainage   Lab Results:   Recent Labs  10/19/14 0030 10/20/14 0051  WBC 17.7* 14.2*  HGB 11.5* 11.8*  HCT 35.8* 36.0  PLT 264 265   BMET  Recent Labs  10/19/14 0030 10/20/14 0051  NA 138 136  K 4.1 3.6  CL 105 105  CO2 23 23  GLUCOSE 108* 126*  BUN 20 19  CREATININE 1.11* 1.01*  CALCIUM 9.0 8.7*   PT/INR No results for input(s): LABPROT, INR in the last 72 hours. CMP     Component Value Date/Time   NA 136 10/20/2014 0051   K 3.6 10/20/2014 0051   CL 105 10/20/2014 0051   CO2 23 10/20/2014 0051   GLUCOSE 126* 10/20/2014 0051   BUN 19 10/20/2014 0051   CREATININE 1.01* 10/20/2014 0051   CALCIUM 8.7* 10/20/2014 0051   PROT 7.0 10/16/2014 2013   ALBUMIN 3.6 10/16/2014 2013   AST 22 10/16/2014 2013   ALT 33 10/16/2014 2013   ALKPHOS 71 10/16/2014 2013   BILITOT 0.7 10/16/2014 2013   GFRNONAA 55* 10/20/2014 0051   GFRAA >60 10/20/2014 0051   Lipase     Component Value Date/Time   LIPASE 31 10/16/2014 2013       Studies/Results: No results  found.  Anti-infectives: Anti-infectives    Start     Dose/Rate Route Frequency Ordered Stop   10/20/14 1200  vancomycin (VANCOCIN) IVPB 750 mg/150 ml premix     750 mg 150 mL/hr over 60 Minutes Intravenous Every 12 hours 10/20/14 0215     10/17/14 0900  fluconazole (DIFLUCAN) IVPB 400 mg    Comments:  Pharm to confirm dosing   400 mg 100 mL/hr over 120 Minutes Intravenous Every 24 hours 10/17/14 0748 10/24/14 0859   10/17/14 0600  piperacillin-tazobactam (ZOSYN) IVPB 3.375 g     3.375 g 12.5 mL/hr over 240 Minutes Intravenous Every 8 hours 10/17/14 0014     10/17/14 0130  vancomycin (VANCOCIN) IVPB 1000 mg/200 mL premix  Status:  Discontinued     1,000 mg 200 mL/hr over 60 Minutes Intravenous Every 24 hours 10/17/14 0124 10/20/14 0214   10/16/14 2115  piperacillin-tazobactam (ZOSYN) IVPB 3.375 g     3.375 g 100 mL/hr over 30 Minutes Intravenous  Once 10/16/14 2114 10/16/14 2215   10/16/14 2100  piperacillin-tazobactam (ZOSYN) IVPB 4.5 g  Status:  Discontinued     4.5 g 200 mL/hr over 30 Minutes Intravenous  Once 10/16/14 2054 10/16/14 2111       Assessment/Plan POD #6, s/p open repair of perforated gastric ulcer with biopsy -IV prn pain  meds, cont robaxin, add norco, ice, abdominal binder, heat on back -Cont WD BID dressing changes -Resume home meds -Monitor drain output carefully with initiation of diet -Clears, fulls in AM if tolerating -PT eval and mobilization - PT recommending HH and 24hr supervision -Cont pulm toileting  -Vanc/Zosyn Day #6, Diflucan Day #5. Continue on Vanc, following cultures given GPC on gram stain. One anaerobic cx still pending.  Will d/c at least on discharge.  De-escilate when culture is final.  -Check labs tomorrow am -Protonix BID  Respiratory failure -extubated 9/19, on 2L [place] today and working some, but in no distress -cont with nebs prn and O2 prn -Medicine following Peaked T waves -Cardiology following.  ?Takatsubo cardiomyopathy.   They have added ACE today, lisinopril and imdur.   Lung nodule -will need outpatient follow up to have this evaluated. DVT Prophylaxis -SCDs/Lovenox Disp -F/u with Dr. Grandville Silos will be arranged, arrangements for [facility] services made.  Aim for Sunday discharge.    LOS: 5 days    Nat Christen 10/21/2014, 10:25 AM Pager: [phone]

## 2014-10-21 NOTE — Clinical Documentation Improvement (Signed)
Internal Medicine  Can the diagnosis of Respiratory Failure be further specified?   Document Acuity - Acute, Chronic, Acute on Chronic  Document Inclusion Of - Hypoxia, Hypercapnia, Combination of Both  Other  Clinically Undetermined  Document any associated diagnoses/conditions.   Supporting Information Respiratory failure -extubated 9/19, on 2L [place] today and working some, but in no distress -cont with nebs prn and O2 prn   Please exercise your independent, professional judgment when responding. A specific answer is not anticipated or expected.   Thank You,  [place] [phone]

## 2014-10-21 NOTE — Progress Notes (Signed)
[facility] PROGRESS NOTE  Sally Douglas SAY:[id number] DOB: [birth year] DOA: 10/16/2014 PCP: Nyoka Cowden, MD   Brief narrative Medial consult for a  69 year old female with history of hypertension, tobacco abuse, and low back pain who presented with acute onset epigastric pain radiating to bilateral shoulder. She was septic with significant leukocytosis on presentation (20 5K). CT angiographic chest abdomen and pelvis showed free peritoneal air with fluid consistent with perforated gastric ulcer. Patient taken to OR on 9/19 with exploratory laparotomy with repair of gastric perforation and a JP drain placed in . Patient extubated on 9/20 and transferred to medical floor.    Assessment/Plan: Sepsis secondary to perforated gastric ulcer with peritonitis Status post laparotomy with repair. On empiric vancomycin and Zosyn (day 6). Fluid culture growing few gram-positive cocci in pairs and clusters. No MRSA isolated.  narrow antibiotic to Levaquin. Total duration of antibiotics would be for 14 days. Continue Diflucan for total 7 day course. -Remains afebrile. Wbc improving. Off NG tube and tolerating advanced diet. Passing gas and pain better controlled. IV PPI twice a day. -Possible discharge home on 9/25 per primary.  NSTEMI Possibly associated with demand ischemia versus stress induced cardiomyopathy. T-wave inversion in lateral leads noted with troponin peaked at 0.38. 2 d echo with normal EF and comments on possible apical MI.   No chest pain and no cardiac history in the past. Patient not a candidate for antiplatelets therapy given perforated gastric ulcer with surgery. Also not a candidate for anticoagulation. -Started on scheduled metoprolol, Imdur and low-dose ACE inhibitor. Blood pressure elevated which could be contributed by underlying pain. Also on scheduled hydralazine and dose adjusted.  Plan on repeating echo in 2 weeks. Cardiology will follow up as  outpatient.  Acute kidney injury Mild, secondary to sepsis. Improving with IV hydration.  Right lower lobe nodule Incidental finding with 11 mm right lower lobe lung nodule. Given her smoking history she would need a PET scan versus follow-up chest CT in 3 months.  Tobacco abuse Counseled on cessation.  nicotine patch  Shortness of breath Minimize fluid. Improved with IV lasix x1 on 9/21  Uncontrolled hypertension Medications adjusted.   Diet: Full liquid Prophylaxis: Subcutaneous Lovenox  Code Status: Full code Family Communication: Husband at bedside Disposition Plan: Per primary. (Planned for 9/25)      Procedure  Laparotomy with repair of perforated gastric ulcer on 9/18  CT angiogram chest abdomen and pelvis.  Antibiotics:  Vancomycin/Zosyn/Diflucan 9/18--  Levaquin 9/23-  HPI/Subjective: Seen and examined. He reports abdominal fullness and pain. Passing gas.  Objective: Filed Vitals:   10/21/14 1056  BP: 173/81  Pulse: 66  Temp:   Resp:     Intake/Output Summary (Last 24 hours) at 10/21/14 1309 Last data filed at 10/21/14 1105  Gross per 24 hour  Intake  292.5 ml  Output    100 ml  Net  192.5 ml   Filed Weights   10/16/14 1838  Weight: 74.844 kg (165 lb)    Exam:   General:  in some pain and discomfort  HEENT: , , supple neck, moist mucosa  Cardiovascular: Normal S1 and S2, no murmurs rub or gallop  Respiratory: clear  bilaterally  Abdomen: Dressing over laparotomy site clean, bowel sounds present  Musculoskeletal: Warm, no  edema  CNS: Alert and oriented  Data Reviewed: Basic Metabolic Panel:  Recent Labs Lab 10/17/14 0212 10/18/14 0310 10/18/14 1123 10/19/14 0030 10/20/14 0051  NA 140 135 134* 138 136  K 3.7  4.3 4.0 4.1 3.6  CL 108 105 101 105 105  CO2 23 22 24 23 23   GLUCOSE 182* 189* 127* 108* 126*  BUN 28* 23* 20 20 19   CREATININE 1.27* 1.22* 1.13* 1.11* 1.01*  CALCIUM 8.0* 8.5* 8.5* 9.0 8.7*  MG  --   --   1.8  --   --   PHOS  --   --  4.2  --   --    Liver Function Tests:  Recent Labs Lab 10/16/14 2013  AST 22  ALT 33  ALKPHOS 71  BILITOT 0.7  PROT 7.0  ALBUMIN 3.6    Recent Labs Lab 10/16/14 2013  LIPASE 31   No results for input(s): AMMONIA in the last 168 hours. CBC:  Recent Labs Lab 10/16/14 2013 10/17/14 0212 10/18/14 0310 10/19/14 0030 10/20/14 0051  WBC 24.9* 20.6* 19.4* 17.7* 14.2*  NEUTROABS 21.3*  --   --   --   --   HGB 14.5 12.7 11.0* 11.5* 11.8*  HCT 44.0 39.8 35.2* 35.8* 36.0  MCV 91.5 95.4 93.9 93.0 91.1  PLT 378 292 241 264 265   Cardiac Enzymes:  Recent Labs Lab 10/18/14 0903 10/18/14 1443 10/18/14 2025  TROPONINI 0.28* 0.28* 0.33*   BNP (last 3 results) No results for input(s): BNP in the last 8760 hours.  ProBNP (last 3 results) No results for input(s): PROBNP in the last 8760 hours.  CBG:  Recent Labs Lab 10/20/14 1700 10/20/14 2045 10/21/14 0044 10/21/14 0514 10/21/14 0817  GLUCAP 119* 121* 115* 113* 112*    Recent Results (from the past 240 hour(s))  Body fluid culture     Status: None   Collection Time: 10/16/14 10:55 PM  Result Value Ref Range Status   Specimen Description FLUID PERITONEAL  Final   Special Requests NONE  Final   Gram Stain   Final    DIRECT SMEAR ABUNDANT WBC PRESENT,BOTH PMN AND MONONUCLEAR FEW GRAM POSITIVE COCCI IN PAIRS IN CLUSTERS CRITICAL RESULT CALLED TO, READ BACK BY AND VERIFIED WITH: S.MYERS,RN [id number] 10/17/14 M.CAMPBELL CONFIRMED BY B.MARTIN    Culture MULTIPLE ORGANISMS PRESENT, NONE PREDOMINANT  Final   Report Status 10/18/2014 FINAL  Final  Anaerobic culture     Status: None (Preliminary result)   Collection Time: 10/16/14 10:55 PM  Result Value Ref Range Status   Specimen Description FLUID PERITONEAL  Final   Special Requests NONE  Final   Gram Stain   Final    ABUNDANT WBC PRESENT,BOTH PMN AND MONONUCLEAR FEW GRAM POSITIVE COCCI IN PAIRS IN CLUSTERS    Culture   Final    NO  ANAEROBES ISOLATED; CULTURE IN PROGRESS FOR 5 DAYS   Report Status PENDING  Incomplete  Anaerobic culture     Status: None (Preliminary result)   Collection Time: 10/16/14 11:10 PM  Result Value Ref Range Status   Specimen Description TISSUE STOMA  Final   Special Requests NONE  Final   Gram Stain   Final    NO WBC SEEN NO SQUAMOUS EPITHELIAL CELLS SEEN NO ORGANISMS SEEN Performed at [facility]    Culture   Final    NO ANAEROBES ISOLATED; CULTURE IN PROGRESS FOR 5 DAYS Performed at [facility]    Report Status PENDING  Incomplete  Tissue culture     Status: None   Collection Time: 10/16/14 11:10 PM  Result Value Ref Range Status   Specimen Description TISSUE STOMA  Final   Special Requests NONE  Final   Gram Stain   Final    NO WBC SEEN NO SQUAMOUS EPITHELIAL CELLS SEEN NO ORGANISMS SEEN Performed at [facility]    Culture   Final    NO GROWTH 3 DAYS Performed at [facility]    Report Status 10/20/2014 FINAL  Final  MRSA PCR Screening     Status: None   Collection Time: 10/17/14 12:20 AM  Result Value Ref Range Status   MRSA by PCR NEGATIVE NEGATIVE Final    Comment:        The GeneXpert MRSA Assay (FDA approved for NASAL specimens only), is one component of a comprehensive MRSA colonization surveillance program. It is not intended to diagnose MRSA infection nor to guide or monitor treatment for MRSA infections.   Culture, blood (single)     Status: None (Preliminary result)   Collection Time: 10/17/14 12:25 AM  Result Value Ref Range Status   Specimen Description BLOOD RIGHT HAND  Final   Special Requests IN PEDIATRIC BOTTLE 2CC  Final   Culture NO GROWTH 3 DAYS  Final   Report Status PENDING  Incomplete     Studies: No results found.  Scheduled Meds: . antiseptic oral rinse  7 mL Mouth Rinse QID  . chlorhexidine gluconate  15 mL Mouth Rinse BID  . docusate sodium  100 mg Oral BID  . enoxaparin (LOVENOX) injection   40 mg Subcutaneous Q24H  . [START ON 10/22/2014] fluconazole  400 mg Oral Daily  . hydrALAZINE  50 mg Oral 3 times per day  . insulin aspart  0-24 Units Subcutaneous 6 times per day  . isosorbide mononitrate  30 mg Oral Daily  . levofloxacin  750 mg Oral Q48H  . lisinopril  5 mg Oral Daily  . metoprolol tartrate  50 mg Oral BID  . nortriptyline  25-50 mg Oral QHS  . pantoprazole (PROTONIX) IV  40 mg Intravenous Q12H   Continuous Infusions: . sodium chloride 50 mL/hr at 10/20/14 0427      Time spent: 25 minutes    Ahna Konkle  [facility] Pager [phone] If 7PM-7AM, please contact night-coverage at [email or web address], password [facility] 10/21/2014, 1:09 PM  LOS: 5 days

## 2014-10-21 NOTE — Care Management Important Message (Signed)
Important Message  Patient Details  Name: Sally Douglas MRN: [id number] Date of Birth: 11-27-1945   Medicare Important Message Given:  Yes-third notification given    Loann Quill 10/21/2014, 11:14 AM

## 2014-10-21 NOTE — Progress Notes (Signed)
Pharmacist Provided - Patient Medication Education Prior to Discharge   Sally Douglas is an 69 y.o. female who presented to [facility] on 10/16/2014 with a chief complaint of  Chief Complaint  Patient presents with  . Chest Pain     [x]  Patient will be discharged with 1 (possibly) new medications []  Patient being discharged without any new medications  The following medications were discussed with the patient: protonix   Pain Control medications: []  Yes    [x]  No  Diabetes Medications: []  Yes    [x]  No  Heart Failure Medications: []  Yes    [x]  No  Anticoagulation Medications:  []  Yes    [x]  No  Antibiotics at discharge: []  Yes    [x]  No  Allergy Assessment Completed and Updated: [x]  Yes    []  No Identified Patient Allergies:  Allergies  Allergen Reactions  . K-Dur [Potassium Chloride] Swelling    Face Swelling  . Nsaids     H/o PUD with perforation     Medication Adherence Assessment: [x]  Excellent (no doses missed/week)      []  Good (1 dose missed/week)      []  Partial (2-3 doses missed/week)      []  Poor (>3 doses missed/week)  Barriers to Obtaining Medications: []  Yes [x]  No   Assessment: I spoke with the patient and her husband regarding protonix. I counseled them on what the medication was for, how and when she should take it, and any possible side effects. The patients husband also had a few questions regarding the change in her blood pressure regimen. I informed him that I will be working on the floor throughout the weekend and that I will continue to update them on any medication regimen changes until final discharge orders are written. They were very pleased with this information and thankful for the insight. They did not have any further questions at this time. I will follow up with this patient again tomorrow and Sunday to assure proper counseling of any new start meds prior to her discharge.    Time spent preparing for discharge counseling: 10 min  Time spent  counseling patient: 15 minutes   Meagan C. Lennox Grumbles, PharmD Pharmacy Resident  Pager: [phone] 10/21/2014 5:49 PM

## 2014-10-22 LAB — GLUCOSE, CAPILLARY
GLUCOSE-CAPILLARY: 105 mg/dL — AB (ref 65–99)
GLUCOSE-CAPILLARY: 121 mg/dL — AB (ref 65–99)
GLUCOSE-CAPILLARY: 207 mg/dL — AB (ref 65–99)
GLUCOSE-CAPILLARY: 94 mg/dL (ref 65–99)
Glucose-Capillary: 97 mg/dL (ref 65–99)
Glucose-Capillary: 137 mg/dL — ABNORMAL HIGH (ref 65–99)
Glucose-Capillary: 111 mg/dL — ABNORMAL HIGH (ref 65–99)

## 2014-10-22 LAB — CULTURE, BLOOD (SINGLE): Culture: NO GROWTH

## 2014-10-22 LAB — CBC
HCT: 35.7 % — ABNORMAL LOW (ref 36.0–46.0)
Hemoglobin: 11.5 g/dL — ABNORMAL LOW (ref 12.0–15.0)
MCH: 29.9 pg (ref 26.0–34.0)
MCHC: 32.2 g/dL (ref 30.0–36.0)
MCV: 93 fL (ref 78.0–100.0)
PLATELETS: 258 10*3/uL (ref 150–400)
RBC: 3.84 MIL/uL — ABNORMAL LOW (ref 3.87–5.11)
RDW: 15.8 % — ABNORMAL HIGH (ref 11.5–15.5)
WBC: 12.8 10*3/uL — ABNORMAL HIGH (ref 4.0–10.5)

## 2014-10-22 LAB — ANAEROBIC CULTURE: GRAM STAIN: NONE SEEN

## 2014-10-22 LAB — BASIC METABOLIC PANEL
Anion gap: 10 (ref 5–15)
BUN: 22 mg/dL — AB (ref 6–20)
CALCIUM: 8.7 mg/dL — AB (ref 8.9–10.3)
CO2: 23 mmol/L (ref 22–32)
CREATININE: 1.02 mg/dL — AB (ref 0.44–1.00)
Chloride: 107 mmol/L (ref 101–111)
GFR calc non Af Amer: 55 mL/min — ABNORMAL LOW (ref >60)
GLUCOSE: 104 mg/dL — AB (ref 65–99)
Potassium: 3.4 mmol/L — ABNORMAL LOW (ref 3.5–5.1)
Sodium: 140 mmol/L (ref 135–145)

## 2014-10-22 MED ORDER — HYDROCODONE-ACETAMINOPHEN 10-325 MG PO TABS: 0.5000 | ORAL_TABLET | Freq: Four times a day (QID) | ORAL | Status: DC | PRN

## 2014-10-22 MED ORDER — PANTOPRAZOLE SODIUM 40 MG PO TBEC
40.0000 mg | DELAYED_RELEASE_TABLET | Freq: Two times a day (BID) | ORAL | Status: DC
  Administered 2014-10-22 – 2014-10-23 (×3): 40 mg via ORAL
  Filled 2014-10-22 (×3): qty 1

## 2014-10-22 MED ORDER — METOPROLOL TARTRATE 50 MG PO TABS: 50.0000 mg | ORAL_TABLET | Freq: Two times a day (BID) | ORAL | Status: DC

## 2014-10-22 MED ORDER — ENSURE ENLIVE PO LIQD
237.0000 mL | Freq: Two times a day (BID) | ORAL | Status: DC
  Administered 2014-10-22 – 2014-10-23 (×4): 237 mL via ORAL

## 2014-10-22 MED ORDER — HYDRALAZINE HCL 50 MG PO TABS: 50.0000 mg | ORAL_TABLET | Freq: Three times a day (TID) | ORAL | Status: DC

## 2014-10-22 MED ORDER — DOCUSATE SODIUM 100 MG PO CAPS
100.0000 mg | ORAL_CAPSULE | Freq: Two times a day (BID) | ORAL | Status: DC
Start: 2014-10-22 — End: 2014-11-11

## 2014-10-22 MED ORDER — ISOSORBIDE MONONITRATE ER 30 MG PO TB24: 30.0000 mg | ORAL_TABLET | Freq: Every day | ORAL | Status: DC

## 2014-10-22 MED ORDER — LISINOPRIL 10 MG PO TABS: 10.0000 mg | ORAL_TABLET | Freq: Every day | ORAL | Status: DC

## 2014-10-22 MED ORDER — LISINOPRIL 5 MG PO TABS: 5.0000 mg | ORAL_TABLET | Freq: Every day | ORAL | Status: DC

## 2014-10-22 MED ORDER — BISACODYL 10 MG RE SUPP: 10.0000 mg | Freq: Every day | RECTAL | Status: DC | PRN

## 2014-10-22 MED ORDER — LEVOFLOXACIN 750 MG PO TABS: 750.0000 mg | ORAL_TABLET | ORAL | Status: AC

## 2014-10-22 MED ORDER — POTASSIUM CHLORIDE CRYS ER 20 MEQ PO TBCR: 40.0000 meq | EXTENDED_RELEASE_TABLET | Freq: Once | ORAL | Status: DC

## 2014-10-22 MED ORDER — HYDROMORPHONE HCL 1 MG/ML IJ SOLN
0.5000 mg | INTRAMUSCULAR | Status: DC | PRN
Start: 2014-10-22 — End: 2014-10-22

## 2014-10-22 MED ORDER — ENSURE ENLIVE PO LIQD: 237.0000 mL | Freq: Two times a day (BID) | ORAL | Status: DC

## 2014-10-22 NOTE — Progress Notes (Signed)
[facility] PROGRESS NOTE  Sally Douglas ZTI:[id number] DOB: 07-Apr-1945 DOA: 10/16/2014 PCP: Nyoka Cowden, MD   Brief narrative Medial consult for a  69 year old female with history of hypertension, tobacco abuse, and low back pain who presented with acute onset epigastric pain radiating to bilateral shoulder. She was septic with significant leukocytosis on presentation (20 5K). CT angiographic chest abdomen and pelvis showed free peritoneal air with fluid consistent with perforated gastric ulcer. Patient taken to OR on 9/19 with exploratory laparotomy with repair of gastric perforation and a JP drain placed in . Patient extubated on 9/20 and transferred to medical floor.    Assessment/Plan: Sepsis secondary to perforated gastric ulcer with peritonitis Status post laparotomy with repair.  .-Fluid culture growing few gram-positive cocci in pairs and clusters. No MRSA isolated. Received empiric vancomycin and Zosyn on admission, now narrowed  to Levaquin.  -Given sepsis with peritonitis would recommend treating for total 10 days of antibiotics (stop date 10/26/2014, prescription in the chart) --Remains afebrile. Wbc improving. Diet advanced. -Plan on discharge home on 9/25.  NSTEMI Possibly associated with demand ischemia versus stress induced cardiomyopathy. T-wave inversion in lateral leads noted with troponin peaked at 0.38. 2 d echo with normal EF and comments on possible apical MI.   No chest pain and no cardiac history in the past. Patient not a candidate for antiplatelets therapy given perforated gastric ulcer with surgery. Also not a candidate for anticoagulation. -Started on scheduled metoprolol, Imdur and low-dose ACE inhibitor. Blood pressure elevated which could be contributed by underlying pain. Also on scheduled hydralazine. Plan on repeating echo in 2 weeks. Cardiology will follow up as outpatient.   Uncontrolled hypertension Medications adjusted. Patient on  Cardizem and HCTZ as outpatient which will be replaced by metoprolol, hydralazine, Imdur and lisinopril. Blood pressure improved but still elevated. I have increased lisinopril dose further. (Prescription in the chart) -If blood pressure is still elevated would recommend increasing metoprolol to 75 mg twice daily (if heart rate permits) or increasing hydralazine to 100 mg tid. I think once her pain is even more controlled and has better mobility, BP will start to improve.  Acute kidney injury Mild, secondary to sepsis. Resolved with hydration.  Right lower lobe nodule Incidental finding with 11 mm right lower lobe lung nodule. Given her smoking history she would need a PET scan versus follow-up chest CT in 3 months.  Tobacco abuse Counseled on cessation.  nicotine patch  Hypokalemia Refused potassium supplements as it caused facial swelling recently. Wants to try foods rich in  potassium.   We'll sign off. Please call for any questions.   Diet: Soft DVT Prophylaxis: Subcutaneous Lovenox  Code Status: Full code Family Communication: Husband at bedside Disposition Plan: Per primary. (Planned for 9/25)      Procedure  Laparotomy with repair of perforated gastric ulcer on 9/18  CT angiogram chest abdomen and pelvis.  Antibiotics:  Vancomycin/Zosyn/Diflucan 9/18--  Levaquin 9/23-until 9/28  HPI/Subjective: Seen and examined. Abdominal pain improving. Once to advance diet.  Objective: Filed Vitals:   10/22/14 0520  BP: 162/77  Pulse: 70  Temp: 98.5 F (36.9 C)  Resp: 16    Intake/Output Summary (Last 24 hours) at 10/22/14 0948 Last data filed at 10/22/14 0515  Gross per 24 hour  Intake 2418.34 ml  Output     96 ml  Net 2322.34 ml   Filed Weights   10/16/14 1838  Weight: 74.844 kg (165 lb)    Exam:   General:  Not in distress  HEENT: , supple neck, moist mucosa  Cardiovascular: Normal S1 and S2, no murmurs rub or gallop  Respiratory: clear   bilaterally  Abdomen: Dressing over laparotomy site clean, bowel sounds present  Musculoskeletal: Warm, no  edema  CNS: Alert and oriented  Data Reviewed: Basic Metabolic Panel:  Recent Labs Lab 10/18/14 0310 10/18/14 1123 10/19/14 0030 10/20/14 0051 10/22/14 0559  NA 135 134* 138 136 140  K 4.3 4.0 4.1 3.6 3.4*  CL 105 101 105 105 107  CO2 22 24 23 23 23   GLUCOSE 189* 127* 108* 126* 104*  BUN 23* 20 20 19  22*  CREATININE 1.22* 1.13* 1.11* 1.01* 1.02*  CALCIUM 8.5* 8.5* 9.0 8.7* 8.7*  MG  --  1.8  --   --   --   PHOS  --  4.2  --   --   --    Liver Function Tests:  Recent Labs Lab 10/16/14 2013  AST 22  ALT 33  ALKPHOS 71  BILITOT 0.7  PROT 7.0  ALBUMIN 3.6    Recent Labs Lab 10/16/14 2013  LIPASE 31   No results for input(s): AMMONIA in the last 168 hours. CBC:  Recent Labs Lab 10/16/14 2013 10/17/14 0212 10/18/14 0310 10/19/14 0030 10/20/14 0051 10/22/14 0559  WBC 24.9* 20.6* 19.4* 17.7* 14.2* 12.8*  NEUTROABS 21.3*  --   --   --   --   --   HGB 14.5 12.7 11.0* 11.5* 11.8* 11.5*  HCT 44.0 39.8 35.2* 35.8* 36.0 35.7*  MCV 91.5 95.4 93.9 93.0 91.1 93.0  PLT 378 292 241 264 265 258   Cardiac Enzymes:  Recent Labs Lab 10/18/14 0903 10/18/14 1443 10/18/14 2025  TROPONINI 0.28* 0.28* 0.33*   BNP (last 3 results) No results for input(s): BNP in the last 8760 hours.  ProBNP (last 3 results) No results for input(s): PROBNP in the last 8760 hours.  CBG:  Recent Labs Lab 10/21/14 1633 10/21/14 2002 10/22/14 0006 10/22/14 0406 10/22/14 0815  GLUCAP 106* 117* 105* 97 94    Recent Results (from the past 240 hour(s))  Body fluid culture     Status: None   Collection Time: 10/16/14 10:55 PM  Result Value Ref Range Status   Specimen Description FLUID PERITONEAL  Final   Special Requests NONE  Final   Gram Stain   Final    DIRECT SMEAR ABUNDANT WBC PRESENT,BOTH PMN AND MONONUCLEAR FEW GRAM POSITIVE COCCI IN PAIRS IN  CLUSTERS CRITICAL RESULT CALLED TO, READ BACK BY AND VERIFIED WITH: S.MYERS,RN [id number] 10/17/14 M.CAMPBELL CONFIRMED BY B.MARTIN    Culture MULTIPLE ORGANISMS PRESENT, NONE PREDOMINANT  Final   Report Status 10/18/2014 FINAL  Final  Anaerobic culture     Status: None   Collection Time: 10/16/14 10:55 PM  Result Value Ref Range Status   Specimen Description FLUID PERITONEAL  Final   Special Requests NONE  Final   Gram Stain   Final    ABUNDANT WBC PRESENT,BOTH PMN AND MONONUCLEAR FEW GRAM POSITIVE COCCI IN PAIRS IN CLUSTERS    Culture NO ANAEROBES ISOLATED  Final   Report Status 10/22/2014 FINAL  Final  Anaerobic culture     Status: None (Preliminary result)   Collection Time: 10/16/14 11:10 PM  Result Value Ref Range Status   Specimen Description TISSUE STOMA  Final   Special Requests NONE  Final   Gram Stain   Final    NO WBC SEEN NO SQUAMOUS EPITHELIAL CELLS SEEN  NO ORGANISMS SEEN Performed at [facility]    Culture   Final    NO ANAEROBES ISOLATED; CULTURE IN PROGRESS FOR 5 DAYS Performed at [facility]    Report Status PENDING  Incomplete  Tissue culture     Status: None   Collection Time: 10/16/14 11:10 PM  Result Value Ref Range Status   Specimen Description TISSUE STOMA  Final   Special Requests NONE  Final   Gram Stain   Final    NO WBC SEEN NO SQUAMOUS EPITHELIAL CELLS SEEN NO ORGANISMS SEEN Performed at [facility]    Culture   Final    NO GROWTH 3 DAYS Performed at [facility]    Report Status 10/20/2014 FINAL  Final  MRSA PCR Screening     Status: None   Collection Time: 10/17/14 12:20 AM  Result Value Ref Range Status   MRSA by PCR NEGATIVE NEGATIVE Final    Comment:        The GeneXpert MRSA Assay (FDA approved for NASAL specimens only), is one component of a comprehensive MRSA colonization surveillance program. It is not intended to diagnose MRSA infection nor to guide or monitor treatment for MRSA  infections.   Culture, blood (single)     Status: None (Preliminary result)   Collection Time: 10/17/14 12:25 AM  Result Value Ref Range Status   Specimen Description BLOOD RIGHT HAND  Final   Special Requests IN PEDIATRIC BOTTLE 2CC  Final   Culture NO GROWTH 4 DAYS  Final   Report Status PENDING  Incomplete     Studies: No results found.  Scheduled Meds: . antiseptic oral rinse  7 mL Mouth Rinse QID  . chlorhexidine  15 mL Mouth Rinse BID  . docusate sodium  100 mg Oral BID  . enoxaparin (LOVENOX) injection  40 mg Subcutaneous Q24H  . feeding supplement (ENSURE ENLIVE)  237 mL Oral BID BM  . fluconazole  400 mg Oral Daily  . hydrALAZINE  50 mg Oral 3 times per day  . insulin aspart  0-24 Units Subcutaneous 6 times per day  . isosorbide mononitrate  30 mg Oral Daily  . levofloxacin  750 mg Oral Q48H  . lisinopril  5 mg Oral Daily  . metoprolol tartrate  50 mg Oral BID  . nortriptyline  25-50 mg Oral QHS  . pantoprazole  40 mg Oral BID AC   Continuous Infusions: . sodium chloride 50 mL/hr at 10/21/14 2316      Time spent: 25 minutes    Louellen Molder  [facility] Pager [phone] If 7PM-7AM, please contact night-coverage at [email or web address], password [facility] 10/22/2014, 9:48 AM  LOS: 6 days

## 2014-10-22 NOTE — Progress Notes (Signed)
[facility] Progress Note  6 Days Post-Op  Subjective: Pt much better controlled.  No N/V.  Tolerating clears.  Ambulating well in the hall with her husband.  Urinating well.  Having BM's and flatus.  Wants oatmeal for breakfast.  Wants to go home tomorrow.  Objective: Vital signs in last 24 hours: Temp:  [98.3 F (36.8 C)-98.5 F (36.9 C)] 98.5 F (36.9 C) (09/24 0520) Pulse Rate:  [66-75] 70 (09/24 0520) Resp:  [16-18] 16 (09/24 0520) BP: (132-173)/(72-94) 162/77 mmHg (09/24 0520) SpO2:  [92 %-97 %] 92 % (09/24 0520) Last BM Date: 10/20/14  Intake/Output from previous day: 09/23 0701 - 09/24 0700 In: 2418.3 [P.O.:690; I.V.:1728.3] Out: 96 [Drains:96] Intake/Output this shift:    PE: Gen:  Alert, NAD, pleasant Abd: Soft, ND, minimal tenderness at incision site, +BS, no HSM, midline dressing clean/dry, drain with serosanguinous drainage 2mL/24hr  Lab Results:   Recent Labs  10/20/14 0051 10/22/14 0559  WBC 14.2* 12.8*  HGB 11.8* 11.5*  HCT 36.0 35.7*  PLT 265 258   BMET  Recent Labs  10/20/14 0051 10/22/14 0559  NA 136 140  K 3.6 3.4*  CL 105 107  CO2 23 23  GLUCOSE 126* 104*  BUN 19 22*  CREATININE 1.01* 1.02*  CALCIUM 8.7* 8.7*   PT/INR No results for input(s): LABPROT, INR in the last 72 hours. CMP     Component Value Date/Time   NA 140 10/22/2014 0559   K 3.4* 10/22/2014 0559   CL 107 10/22/2014 0559   CO2 23 10/22/2014 0559   GLUCOSE 104* 10/22/2014 0559   BUN 22* 10/22/2014 0559   CREATININE 1.02* 10/22/2014 0559   CALCIUM 8.7* 10/22/2014 0559   PROT 7.0 10/16/2014 2013   ALBUMIN 3.6 10/16/2014 2013   AST 22 10/16/2014 2013   ALT 33 10/16/2014 2013   ALKPHOS 71 10/16/2014 2013   BILITOT 0.7 10/16/2014 2013   GFRNONAA 55* 10/22/2014 0559   GFRAA >60 10/22/2014 0559   Lipase     Component Value Date/Time   LIPASE 31 10/16/2014 2013       Studies/Results: No results found.  Anti-infectives: Anti-infectives    Start     Dose/Rate Route Frequency Ordered Stop   10/22/14 1000  fluconazole (DIFLUCAN) tablet 400 mg     400 mg Oral Daily 10/21/14 1219 10/24/14 0959   10/21/14 1400  levofloxacin (LEVAQUIN) tablet 750 mg     750 mg Oral Every 48 hours 10/21/14 1219     10/20/14 1200  vancomycin (VANCOCIN) IVPB 750 mg/150 ml premix  Status:  Discontinued     750 mg 150 mL/hr over 60 Minutes Intravenous Every 12 hours 10/20/14 0215 10/21/14 1140   10/17/14 0900  fluconazole (DIFLUCAN) IVPB 400 mg  Status:  Discontinued    Comments:  Pharm to confirm dosing   400 mg 100 mL/hr over 120 Minutes Intravenous Every 24 hours 10/17/14 0748 10/21/14 1219   10/17/14 0600  piperacillin-tazobactam (ZOSYN) IVPB 3.375 g  Status:  Discontinued     3.375 g 12.5 mL/hr over 240 Minutes Intravenous Every 8 hours 10/17/14 0014 10/21/14 1140   10/17/14 0130  vancomycin (VANCOCIN) IVPB 1000 mg/200 mL premix  Status:  Discontinued     1,000 mg 200 mL/hr over 60 Minutes Intravenous Every 24 hours 10/17/14 0124 10/20/14 0214   10/16/14 2115  piperacillin-tazobactam (ZOSYN) IVPB 3.375 g     3.375 g 100 mL/hr over 30 Minutes Intravenous  Once 10/16/14 2114 10/16/14 2215  10/16/14 2100  piperacillin-tazobactam (ZOSYN) IVPB 4.5 g  Status:  Discontinued     4.5 g 200 mL/hr over 30 Minutes Intravenous  Once 10/16/14 2054 10/16/14 2111       Assessment/Plan POD #7, s/p open repair of perforated gastric ulcer with biopsy -IV prn pain meds, cont robaxin, norco, ice abdominal binder -Cont WD BID dressing changes  -On home meds -Monitor drain output carefully with diet, hopefully can d/c tomorrow before discharge if low output -Fulls this am, soft at dinner if tolerating -PT eval and mobilization - PT recommending HH and 24hr supervision -Cont pulm toileting  -Antibiotics have been narrowed to Levaquin, and on Fluconazole PO, can be discontinued at discharge -Check CBC in am -Protonix BID PO Respiratory failure -extubated  9/19, On RA, Medicine following Peaked T waves -Cardiology following. ?Takatsubo cardiomyopathy. Back on home meds.  OP follow up arranged by cards.  NO NSAIDS! HTN -Hydralazine 25mg  TID, metoprolol 50 BID, and lisinopril today at 5 mg. Lung nodule -will need outpatient follow up to have this evaluated. DVT Prophylaxis -SCDs/Lovenox Disp -F/u with Dr. Grandville Silos will be arranged.  Arrangements for [facility] services made. Will need HH for WD dressing changes to her midline wound once daily.  Aim for Sunday discharge.    LOS: 6 days    Nat Christen 10/22/2014, 8:03 AM Pager: [phone]

## 2014-10-22 NOTE — Progress Notes (Signed)
Pt has 2 abd binders velcro together. Pt states she will use at home

## 2014-10-23 ENCOUNTER — Inpatient Hospital Stay (HOSPITAL_COMMUNITY): Payer: Medicare Other

## 2014-10-23 ENCOUNTER — Encounter (HOSPITAL_COMMUNITY): Payer: Self-pay | Admitting: Radiology

## 2014-10-23 DIAGNOSIS — R778 Other specified abnormalities of plasma proteins: Secondary | ICD-10-CM | POA: Diagnosis not present

## 2014-10-23 DIAGNOSIS — R7989 Other specified abnormal findings of blood chemistry: Secondary | ICD-10-CM | POA: Diagnosis not present

## 2014-10-23 DIAGNOSIS — J9600 Acute respiratory failure, unspecified whether with hypoxia or hypercapnia: Secondary | ICD-10-CM | POA: Diagnosis present

## 2014-10-23 DIAGNOSIS — R9431 Abnormal electrocardiogram [ECG] [EKG]: Secondary | ICD-10-CM | POA: Diagnosis not present

## 2014-10-23 LAB — GLUCOSE, CAPILLARY
GLUCOSE-CAPILLARY: 142 mg/dL — AB (ref 65–99)
GLUCOSE-CAPILLARY: 138 mg/dL — AB (ref 65–99)
GLUCOSE-CAPILLARY: 102 mg/dL — AB (ref 65–99)
Glucose-Capillary: 167 mg/dL — ABNORMAL HIGH (ref 65–99)

## 2014-10-23 LAB — CBC
HEMATOCRIT: 35.2 % — AB (ref 36.0–46.0)
HEMOGLOBIN: 11.6 g/dL — AB (ref 12.0–15.0)
MCH: 30.3 pg (ref 26.0–34.0)
MCHC: 33 g/dL (ref 30.0–36.0)
MCV: 91.9 fL (ref 78.0–100.0)
Platelets: 259 10*3/uL (ref 150–400)
RBC: 3.83 MIL/uL — AB (ref 3.87–5.11)
RDW: 15.9 % — ABNORMAL HIGH (ref 11.5–15.5)
WBC: 14 10*3/uL — ABNORMAL HIGH (ref 4.0–10.5)

## 2014-10-23 LAB — CREATININE, SERUM
Creatinine, Ser: 1.01 mg/dL — ABNORMAL HIGH (ref 0.44–1.00)
GFR, EST NON AFRICAN AMERICAN: 55 mL/min — AB (ref >60)

## 2014-10-23 MED ORDER — IOHEXOL 300 MG/ML  SOLN
25.0000 mL | INTRAMUSCULAR | Status: AC
  Administered 2014-10-23 (×2): 25 mL via ORAL

## 2014-10-23 MED ORDER — IOHEXOL 300 MG/ML  SOLN
100.0000 mL | Freq: Once | INTRAMUSCULAR | Status: AC | PRN
  Administered 2014-10-23: 100 mL via INTRAVENOUS

## 2014-10-23 NOTE — Progress Notes (Signed)
7 Days Post-Op  Subjective: No complaints. Tolerating diet and passing flatus. She did have low grade temp overnight and wbc has bumped up today  Objective: Vital signs in last 24 hours: Temp:  [97.7 F (36.5 C)-99.3 F (37.4 C)] 99.3 F (37.4 C) (09/25 0442) Pulse Rate:  [63-80] 63 (09/25 0442) Resp:  [14-18] 18 (09/25 0442) BP: (138-149)/(65-80) 138/70 mmHg (09/25 0442) SpO2:  [94 %-97 %] 97 % (09/25 0442) Last BM Date: 10/21/14  Intake/Output from previous day: 09/24 0701 - 09/25 0700 In: 680 [P.O.:680] Out: 50 [Drains:50] Intake/Output this shift: Total I/O In: 240 [P.O.:240] Out: -   Resp: clear to auscultation bilaterally Cardio: regular rate and rhythm GI: soft, nontender. drain output serous  Lab Results:   Recent Labs  10/22/14 0559 10/23/14 0624  WBC 12.8* 14.0*  HGB 11.5* 11.6*  HCT 35.7* 35.2*  PLT 258 259   BMET  Recent Labs  10/22/14 0559 10/23/14 0624  NA 140  --   K 3.4*  --   CL 107  --   CO2 23  --   GLUCOSE 104*  --   BUN 22*  --   CREATININE 1.02* 1.01*  CALCIUM 8.7*  --    PT/INR No results for input(s): LABPROT, INR in the last 72 hours. ABG No results for input(s): PHART, HCO3 in the last 72 hours.  Invalid input(s): PCO2, PO2  Studies/Results: No results found.  Anti-infectives: Anti-infectives    Start     Dose/Rate Route Frequency Ordered Stop   10/23/14 0000  levofloxacin (LEVAQUIN) 750 MG tablet     750 mg Oral Every 48 hours 10/22/14 0948 10/26/14 2359   10/22/14 1000  fluconazole (DIFLUCAN) tablet 400 mg     400 mg Oral Daily 10/21/14 1219 10/23/14 0802   10/21/14 1400  levofloxacin (LEVAQUIN) tablet 750 mg     750 mg Oral Every 48 hours 10/21/14 1219     10/20/14 1200  vancomycin (VANCOCIN) IVPB 750 mg/150 ml premix  Status:  Discontinued     750 mg 150 mL/hr over 60 Minutes Intravenous Every 12 hours 10/20/14 0215 10/21/14 1140   10/17/14 0900  fluconazole (DIFLUCAN) IVPB 400 mg  Status:  Discontinued     Comments:  Pharm to confirm dosing   400 mg 100 mL/hr over 120 Minutes Intravenous Every 24 hours 10/17/14 0748 10/21/14 1219   10/17/14 0600  piperacillin-tazobactam (ZOSYN) IVPB 3.375 g  Status:  Discontinued     3.375 g 12.5 mL/hr over 240 Minutes Intravenous Every 8 hours 10/17/14 0014 10/21/14 1140   10/17/14 0130  vancomycin (VANCOCIN) IVPB 1000 mg/200 mL premix  Status:  Discontinued     1,000 mg 200 mL/hr over 60 Minutes Intravenous Every 24 hours 10/17/14 0124 10/20/14 0214   10/16/14 2115  piperacillin-tazobactam (ZOSYN) IVPB 3.375 g     3.375 g 100 mL/hr over 30 Minutes Intravenous  Once 10/16/14 2114 10/16/14 2215   10/16/14 2100  piperacillin-tazobactam (ZOSYN) IVPB 4.5 g  Status:  Discontinued     4.5 g 200 mL/hr over 30 Minutes Intravenous  Once 10/16/14 2054 10/16/14 2111      Assessment/Plan: s/p Procedure(s): EXPLORATORY LAPAROTOMY, REPAIR OF GASTRIC ULCER (N/A) Will get CT abd/pel today to rule out abscess. If neg then will plan for discharge later today  LOS: 7 days    TOTH III,PAUL S 10/23/2014

## 2014-10-23 NOTE — Discharge Instructions (Signed)
----QUIT SMOKING!!!----  You are scheduled for an Echo at our [facility] and later in October you will see the cardiologist Dr. Irish Lack.  Out office number is [phone].   [facility], [place] [phone]  OPEN ABDOMINAL SURGERY: POST OP INSTRUCTIONS  Always review your discharge instruction sheet given to you by the facility where your surgery was performed.  IF YOU HAVE DISABILITY OR FAMILY LEAVE FORMS, YOU MUST BRING THEM TO THE OFFICE FOR PROCESSING.  PLEASE DO NOT GIVE THEM TO YOUR DOCTOR.  1. A prescription for pain medication may be given to you upon discharge.  Take your pain medication as prescribed, if needed.  If narcotic pain medicine is not needed, then you may take acetaminophen (Tylenol) or ibuprofen (Advil) as needed. 2. Take your usually prescribed medications unless otherwise directed. 3. If you need a refill on your pain medication, please contact your pharmacy. They will contact our office to request authorization.  Prescriptions will not be filled after 5pm or on week-ends. 4. You should follow a light diet the first few days after arrival home, such as soup and crackers, pudding, etc.unless your doctor has advised otherwise. A high-fiber, low fat diet can be resumed as tolerated.   Be sure to include lots of fluids daily. Most patients will experience some swelling and bruising on the chest and neck area.  Ice packs will help.  Swelling and bruising can take several days to resolve 5. Most patients will experience some swelling and bruising in the area of the incision. Ice pack will help. Swelling and bruising can take several days to resolve..  6. It is common to experience some constipation if taking pain medication after surgery.  Increasing fluid intake and taking a stool softener will usually help or prevent this problem from occurring.  A mild laxative (Milk of Magnesia or Miralax) should be taken according to package directions if there  are no bowel movements after 48 hours. 7.  You may have steri-strips (small skin tapes) in place directly over the incision.  These strips should be left on the skin for 7-10 days.  If your surgeon used skin glue on the incision, you may shower in 24 hours.  The glue will flake off over the next 2-3 weeks.  Any sutures or staples will be removed at the office during your follow-up visit. You may find that a light gauze bandage over your incision may keep your staples from being rubbed or pulled. You may shower and replace the bandage daily. 8. ACTIVITIES:  You may resume regular (light) daily activities beginning the next day--such as daily self-care, walking, climbing stairs--gradually increasing activities as tolerated.  You may have sexual intercourse when it is comfortable.  Refrain from any heavy lifting or straining until approved by your doctor. a. You may drive when you no longer are taking prescription pain medication, you can comfortably wear a seatbelt, and you can safely maneuver your car and apply brakes b. Return to Work: ___________________________________ 56. You should see your doctor in the office for a follow-up appointment approximately two weeks after your surgery.  Make sure that you call for this appointment within a day or two after you arrive home to insure a convenient appointment time. OTHER INSTRUCTIONS:  _____________________________________________________________ _____________________________________________________________  WHEN TO CALL YOUR DOCTOR: 1. Fever over 101.0 2. Inability to urinate 3. Nausea and/or vomiting 4. Extreme swelling or bruising 5. Continued bleeding from incision. 6. Increased pain,  redness, or drainage from the incision. 7. Difficulty swallowing or breathing 8. Muscle cramping or spasms. 9. Numbness or tingling in hands or feet or around lips.  The clinic staff is available to answer your questions during regular business hours.  Please dont  hesitate to call and ask to speak to one of the nurses if you have concerns.  For further questions, please visit [email or web address]  Dressing Change A dressing is a material placed over wounds. It keeps the wound clean, dry, and protected from further injury. This provides an environment that favors wound healing.  BEFORE YOU BEGIN  Get your supplies together. Things you may need include:  Saline solution.  Flexible gauze dressing.  Medicated cream.  Tape.  Gloves.  Abdominal dressing pads.  Gauze squares.  Plastic bags.  Take pain medicine 30 minutes before the dressing change if you need it.  Take a shower before you do the first dressing change of the day. Use plastic wrap or a plastic bag to prevent the dressing from getting wet. REMOVING YOUR OLD DRESSING   Wash your hands with soap and water. Dry your hands with a clean towel.  Put on your gloves.  Remove any tape.  Carefully remove the old dressing. If the dressing sticks, you may dampen it with warm water to loosen it, or follow your caregiver's specific directions.  Remove any gauze or packing tape that is in your wound.  Take off your gloves.  Put the gloves, tape, gauze, or any packing tape into a plastic bag. CHANGING YOUR DRESSING  Open the supplies.  Take the cap off the saline solution.  Open the gauze package so that the gauze remains on the inside of the package.  Put on your gloves.  Clean your wound as told by your caregiver.  If you have been told to keep your wound dry, follow those instructions.  Your caregiver may tell you to do one or more of the following:  Pick up the gauze. Pour the saline solution over the gauze. Squeeze out the extra saline solution.  Put medicated cream or other medicine on your wound if you have been told to do so.  Put the solution soaked gauze only in your wound, not on the skin around it.  Pack your wound loosely or as told by your  caregiver.  Put dry gauze on your wound.  Put abdominal dressing pads over the dry gauze if your wet gauze soaks through.  Tape the abdominal dressing pads in place so they will not fall off. Do not wrap the tape completely around the affected part (arm, leg, abdomen).  Wrap the dressing pads with a flexible gauze dressing to secure it in place.  Take off your gloves. Put them in the plastic bag with the old dressing. Tie the bag shut and throw it away.  Keep the dressing clean and dry until your next dressing change.  Wash your hands. SEEK MEDICAL CARE IF:  Your skin around the wound looks red.  Your wound feels more tender or sore.  You see pus in the wound.  Your wound smells bad.  You have a fever.  Your skin around the wound has a rash that itches and burns.  You see black or yellow skin in your wound that was not there before.  You feel nauseous, throw up, and feel very tired. Document Released: 02/22/2004 Document Revised: 04/08/2011 Document Reviewed: 11/26/2010 [facility] Patient Information 2015 [place], [place]. This information is not intended to  replace advice given to you by your health care provider. Make sure you discuss any questions you have with your health care provider.  Nicotine Addiction Nicotine can act as both a stimulant (excites/activates) and a sedative (calms/quiets). Immediately after exposure to nicotine, there is a "kick" caused in part by the drug's stimulation of the adrenal glands and resulting discharge of adrenaline (epinephrine). The rush of adrenaline stimulates the body and causes a sudden release of sugar. This means that smokers are always slightly hyperglycemic. Hyperglycemic means that the blood sugar is high, just like in diabetics. Nicotine also decreases the amount of insulin which helps control sugar levels in the body. There is an increase in blood pressure, breathing, and the rate of heart beats.  In addition, nicotine indirectly  causes a release of dopamine in the brain that controls pleasure and motivation. A similar reaction is seen with other drugs of abuse, such as cocaine and heroin. This dopamine release is thought to cause the pleasurable sensations when smoking. In some different cases, nicotine can also create a calming effect, depending on sensitivity of the smoker's nervous system and the dose of nicotine taken. WHAT HAPPENS WHEN NICOTINE IS TAKEN FOR LONG PERIODS OF TIME?  Long-term use of nicotine results in addiction. It is difficult to stop.  Repeated use of nicotine creates tolerance. Higher doses of nicotine are needed to get the "kick." When nicotine use is stopped, withdrawal may last a month or more. Withdrawal may begin within a few hours after the last cigarette. Symptoms peak within the first few days and may lessen within a few weeks. For some people, however, symptoms may last for months or longer. Withdrawal symptoms include:   Irritability.  Craving.  Learning and attention deficits.  Sleep disturbances.  Increased appetite. Craving for tobacco may last for 6 months or longer. Many behaviors done while using nicotine can also play a part in the severity of withdrawal symptoms. For some people, the feel, smell, and sight of a cigarette and the ritual of obtaining, handling, lighting, and smoking the cigarette are closely linked with the pleasure of smoking. When stopped, they also miss the related behaviors which make the withdrawal or craving worse. While nicotine gum and patches may lessen the drug aspects of withdrawal, cravings often persist. WHAT ARE THE MEDICAL CONSEQUENCES OF NICOTINE USE?  Nicotine addiction accounts for one-third of all cancers. The top cancer caused by tobacco is lung cancer. Lung cancer is the number one cancer killer of both men and women.  Smoking is also associated with cancers of  the:  Mouth.  Pharynx.  Larynx.  Esophagus.  Stomach.  Pancreas.  Cervix.  Kidney.  Ureter.  Bladder.  Smoking also causes lung diseases such as lasting (chronic) bronchitis and emphysema.  It worsens asthma in adults and children.  Smoking increases the risk of heart disease, including:  Stroke.  Heart attack.  Vascular disease.  Aneurysm.  Passive or secondary smoke can also increase medical risks including:  Asthma in children.  Sudden Infant Death Syndrome (SIDS).  Additionally, dropped cigarettes are the leading cause of residential fire fatalities.  Nicotine poisoning has been reported from accidental ingestion of tobacco products by children and pets. Death usually results in a few minutes from respiratory failure (when a person stops breathing) caused by paralysis. TREATMENT   Medication. Nicotine replacement medicines such as nicotine gum and the patch are used to stop smoking. These medicines gradually lower the dosage of nicotine in the body. These  medicines do not contain the carbon monoxide and other toxins found in tobacco smoke.  Hypnotherapy.  Relaxation therapy.  Nicotine Anonymous (a 12-step support program). Find times and locations in your local yellow pages. Document Released: 09/20/2003 Document Revised: 04/08/2011 Document Reviewed: 03/12/2013 [facility] Patient Information 2015 [place], [place]. This information is not intended to replace advice given to you by your health care provider. Make sure you discuss any questions you have with your health care provider.

## 2014-10-23 NOTE — Discharge Summary (Signed)
[facility] Discharge Summary   Patient ID: Sally Douglas  MRN: [id number] DOB/AGE: [birth year] 69 y.o.  Admit date: 10/16/2014 Discharge date: 10/23/2014  Admitting Diagnosis: Free intra-abdominal air Perforated gastric ulcer  Discharge Diagnosis Patient Active Problem List   Diagnosis Date Noted  . Acute respiratory failure 10/23/2014  . Troponin level elevated 10/23/2014  . Abnormal EKG 10/23/2014  . Abdominal pain   . Nonischemic cardiomyopathy   . Endotracheally intubated   . History of ETT   . Perforated gastric ulcer 10/16/2014  . Incarcerated ventral hernia, upper abdomen 11/27/2010  . Colonoscopy refused 11/27/2010  . TOBACCO ABUSE 07/15/2008  . Obesity, Class III, BMI 40-49.9 (morbid obesity) 10/08/2006  . BACK PAIN, CHRONIC 10/08/2006  . Essential hypertension 09/05/2006    Consultants Cardiology - Dr. Irish Lack, Dr. Sallyanne Kuster CCM - Dr. Ancil Linsey Internal Medicine - Dr. Clementeen Graham  Imaging: No results found.  Procedures Dr. Grandville Silos (10/16/14) - Exploratory laparotomy, gastric biopsy, repair of perforated gastric ulcers  Pathology 10/17/14: Diagnosis Stomach, biopsy, Perforated gastric ulcer - BENIGN ADIPOSE SOFT TISSUE WITH FIBROINFLAMMATORY TISSUE REACTION, SEE COMMENT. - NEGATIVE FOR ATYPIA OR MALIGNANCY. - NEGATIVE FOR GASTRIC MUCOSA.  ECHO: 10/19/2014 Study Conclusions - Left ventricle: The cavity size was normal. Wall thickness was increased in a pattern of mild LVH. Systolic function was normal. The estimated ejection fraction was in the range of 55% to 60%. Akinesis of the apicalanteroseptal and apical myocardium. Doppler parameters are consistent with abnormal left ventricular relaxation (grade 1 diastolic dysfunction).  Hospital Course:  Sally Douglas, 69 y/o white female, developed sudden onset upper abdominal pain radiating into both shoulders on 10/16/14. She came to the emergency room for evaluation. Workup here demonstrates  leukocytosis of 24,900. CT angiogram of the chest/abdomen/pelvis was performed revealing free peritoneal air and fluid consistent with perforated gastric ulcer with peritonitis. I was asked to see her for treatment of this. She is known to our practice status post laparoscopic hernia repair in 2012 by Dr. Johney [place].  Patient was admitted and underwent procedure listed above.  Tolerated procedure well and was transferred to the ICU for close monitoring.  She remained on the vent sedated after the OR.  CCM were consulted to help manage her while in the unit.  She experienced mild renal insufficiency, incidental finding of right lower lobe lung nodule.  She was found ot have mild elevation in troponin's and peaked t waves and cardiology was consulted.  They were concerned about ?Takatsubo cardiomyopathy. They made many adjustments to her home BP meds and recommended starting Imdur, metoprolol, hydralazine, and lisinopril.  OP follow up arranged by cards. NO NSAIDS secondary to perforated ulcer.    We awaited improvement in her bowel function.  Eventually the NG tube was discontinued and she was started on clear liquids.  Diet was advanced as tolerated.  She was transferred to the floor.  Her PCA was discontinued.  She was maintained on antibiotics due to pending culture with a gram stain of GPC's.  On POD #8 she was feeling very well.  Her pain is controlled, she's ambulating well, tolerating a regular diet, wound is clean, drain is serosanguinous.  She has no complaints of dysuria, CP/SOB, leg tenderness.  However she's having low grade fevers and leukocytosis (14,000).  She is at risk for intra-abdominal fluid collections thus we obtained a STAT CT scan.  The CT scan showed normal post-op changes, no evidence of a leak, no intra-abdominal abscesses, 35mm right lower lobe pulmonary nodule concerning for malignancy,  right inferior pole kidney lesion.  It is recommended to get repeat CT in 3 mo regarding the lung  nodule and pre/post contrast-enhanced MRI of the right kidney.  These can be arranged by her PCP.  JP drain was maintained and will plan to be discontinued on Friday in our office.  On POD #8, the patient was voiding well, tolerating diet, ambulating well, pain well controlled, vital signs stable, incisions c/d/i and felt stable for discharge home.  Patient will follow up in our office in 2-3 weeks and knows to call with questions or concerns.  She will call to confirm appointment date/time.  HH PT/OT/RN has been set up and DME ordered.  She will need daily WD dressing changes to her midline wound.  It was small enough I did not feel she needed a wound vac.  Given her smoking history, she knows to follow up with her PCP and may need referral to pulmonology or additional imaging such as PET scan or repeat CT scan in 3 months to check for stability of her lung nodule.  She will follow up with Cardiology in 2 weeks for a repeat echo.  Cardiology has already arranged for this.         Medication List    STOP taking these medications        aspirin 81 MG tablet     diltiazem 360 MG 24 hr capsule  Commonly known as:  TIAZAC     furosemide 20 MG tablet  Commonly known as:  LASIX     hydrochlorothiazide 25 MG tablet  Commonly known as:  HYDRODIURIL     meloxicam 7.5 MG tablet  Commonly known as:  MOBIC      TAKE these medications        bisacodyl 10 MG suppository  Commonly known as:  DULCOLAX  Place 1 suppository (10 mg total) rectally daily as needed for mild constipation or moderate constipation.     docusate sodium 100 MG capsule  Commonly known as:  COLACE  Take 1 capsule (100 mg total) by mouth 2 (two) times daily.     feeding supplement (ENSURE ENLIVE) Liqd  Take 237 mLs by mouth 2 (two) times daily between meals.     hydrALAZINE 50 MG tablet  Commonly known as:  APRESOLINE  Take 1 tablet (50 mg total) by mouth every 8 (eight) hours.     HYDROcodone-acetaminophen 10-325 MG  tablet  Commonly known as:  NORCO  Take 0.5-2 tablets by mouth every 6 (six) hours as needed for moderate pain or severe pain (Prescribed by Dr. Maryjean Ka).     isosorbide mononitrate 30 MG 24 hr tablet  Commonly known as:  IMDUR  Take 1 tablet (30 mg total) by mouth daily.     levofloxacin 750 MG tablet  Commonly known as:  LEVAQUIN  Take 1 tablet (750 mg total) by mouth every other day.     lisinopril 10 MG tablet  Commonly known as:  PRINIVIL,ZESTRIL  Take 1 tablet (10 mg total) by mouth daily.     metoprolol 50 MG tablet  Commonly known as:  LOPRESSOR  Take 1 tablet (50 mg total) by mouth 2 (two) times daily.     nortriptyline 25 MG capsule  Commonly known as:  PAMELOR  Take 1-2 capsules (25-50 mg total) by mouth at bedtime.     pantoprazole 40 MG tablet  Commonly known as:  PROTONIX  Take 1 tablet (40 mg total) by mouth 2 (two)  times daily before a meal.     polyethylene glycol powder powder  Commonly known as:  GLYCOLAX/MIRALAX  Take 17 g by mouth daily as needed for moderate constipation.     tiZANidine 4 MG tablet  Commonly known as:  ZANAFLEX  Take 4 mg by mouth every 8 (eight) hours as needed for muscle spasms.     VOLTAREN 1 % Gel  Generic drug:  diclofenac sodium  Apply 1 application topically daily as needed. FOR HANDS     zolpidem 10 MG tablet  Commonly known as:  AMBIEN  TAKE 1 TABLET BY MOUTH AT BEDTIME AS NEEDED FOR SLEEP         Follow-up Information    Call Zenovia Jarred, MD.   Specialty:  General Surgery   Why:  For post-operation check.  Call to confirm an appoitment date/time in 2-3 weeks.   Contact information:   [address] [place] [address] [phone]       Follow up with [facility].   Why:  RN PT OT. Will call in next 24 to 48 hours to set up first visit.    Contact information:   [address] [place] [address] [phone]       Follow up with [facility].    Why:  3in1 to be delivered to room prior to discharge   Contact information:   [address] [place] [address] [phone]       Follow up with [facility] Office On 11/03/2014.   Specialty:  Cardiology   Why:  at 2:00 PM  for an echocardiogram to check your heart   Contact information:   [address], [place] [phone]      Follow up with Jettie Booze., MD On 11/17/2014.   Specialties:  Cardiology, Radiology, Interventional Cardiology   Why:  at 10:15 AM   Contact information:   [address] [place] [address] [phone]       Follow up with Nyoka Cowden, MD. Schedule an appointment as soon as possible for a visit in 2 weeks.   Specialty:  Internal Medicine   Why:  make an appointment with your primary care ASAP for a post-hospital follow up.  Also see a pulmonologist regarding the nodule on your lung.  You man need MRI for your kidney lesion.   Contact information:   [place]  [address] [phone]       Signed: Nat Christen, [facility] [phone]  10/25/2014, 3:34 PM

## 2014-10-23 NOTE — Progress Notes (Signed)
Discharge order

## 2014-10-23 NOTE — Progress Notes (Signed)
Pt and spouse upset-no discharge. Dr has not called/seen pt -CT results. RN text paged Dr Novella Olive [phone] 2x this afternoon and paged Jomarie Longs [phone] response. Husband requests to speak w admin.

## 2014-10-23 NOTE — Progress Notes (Addendum)
PIV removed-pt tol well. Disch instr, f/u appt given, med rec done w pt. Pt aware of what meds left to take today w times due. Explained how to drain, measure fluid-JP drain. Pt spouse stated he understood, watched RNs.

## 2014-10-23 NOTE — Progress Notes (Signed)
Pt left in wc w spouse

## 2014-10-24 DIAGNOSIS — Z720 Tobacco use: Secondary | ICD-10-CM | POA: Diagnosis not present

## 2014-10-24 DIAGNOSIS — Z6841 Body Mass Index (BMI) 40.0 and over, adult: Secondary | ICD-10-CM | POA: Diagnosis not present

## 2014-10-24 DIAGNOSIS — Z48815 Encounter for surgical aftercare following surgery on the digestive system: Secondary | ICD-10-CM | POA: Diagnosis not present

## 2014-10-24 DIAGNOSIS — I214 Non-ST elevation (NSTEMI) myocardial infarction: Secondary | ICD-10-CM | POA: Diagnosis not present

## 2014-10-24 DIAGNOSIS — I10 Essential (primary) hypertension: Secondary | ICD-10-CM | POA: Diagnosis not present

## 2014-10-24 DIAGNOSIS — Z4682 Encounter for fitting and adjustment of non-vascular catheter: Secondary | ICD-10-CM | POA: Diagnosis not present

## 2014-10-25 ENCOUNTER — Telehealth: Payer: Self-pay | Admitting: Internal Medicine

## 2014-10-25 DIAGNOSIS — Z48815 Encounter for surgical aftercare following surgery on the digestive system: Secondary | ICD-10-CM | POA: Diagnosis not present

## 2014-10-25 DIAGNOSIS — Z6841 Body Mass Index (BMI) 40.0 and over, adult: Secondary | ICD-10-CM | POA: Diagnosis not present

## 2014-10-25 DIAGNOSIS — Z4682 Encounter for fitting and adjustment of non-vascular catheter: Secondary | ICD-10-CM | POA: Diagnosis not present

## 2014-10-25 DIAGNOSIS — I214 Non-ST elevation (NSTEMI) myocardial infarction: Secondary | ICD-10-CM | POA: Diagnosis not present

## 2014-10-25 DIAGNOSIS — I10 Essential (primary) hypertension: Secondary | ICD-10-CM | POA: Diagnosis not present

## 2014-10-25 NOTE — Telephone Encounter (Signed)
Stacy a PT with [facility] call to say that pt has a lot of swelling in ankles and legs and is not sure if it is because they change bp medicine  Her bp is good. Would like a call back   [place]  [phone]

## 2014-10-25 NOTE — Telephone Encounter (Signed)
Pt schedule

## 2014-10-25 NOTE — Telephone Encounter (Signed)
Spoke to pt, told her Dr. Raliegh Ip would like to see her this week due to swelling and to follow low salt diet. Pt verbalized understanding. Told pt I will have someone call her back from scheduling to schedule appt. Pt verbalized understanding.

## 2014-10-25 NOTE — Telephone Encounter (Signed)
Suggest post hospital follow-up this week Low-salt diet

## 2014-10-25 NOTE — Telephone Encounter (Signed)
Malachy Mood, please call pt and schedule hospital follow up this week per Dr.K. And cancel appt on 10/10.

## 2014-10-25 NOTE — Telephone Encounter (Signed)
Please see message and advise. Pt has hospital follow up on 10/10.

## 2014-10-26 ENCOUNTER — Encounter: Payer: Self-pay | Admitting: Internal Medicine

## 2014-10-26 ENCOUNTER — Ambulatory Visit (INDEPENDENT_AMBULATORY_CARE_PROVIDER_SITE_OTHER): Payer: Medicare Other | Admitting: Internal Medicine

## 2014-10-26 VITALS — BP 130/80 | HR 75 | Temp 98.2°F | Resp 20 | Ht <= 58 in | Wt 170.0 lb

## 2014-10-26 DIAGNOSIS — J9601 Acute respiratory failure with hypoxia: Secondary | ICD-10-CM

## 2014-10-26 DIAGNOSIS — I428 Other cardiomyopathies: Secondary | ICD-10-CM

## 2014-10-26 DIAGNOSIS — I10 Essential (primary) hypertension: Secondary | ICD-10-CM | POA: Diagnosis not present

## 2014-10-26 DIAGNOSIS — Z6841 Body Mass Index (BMI) 40.0 and over, adult: Secondary | ICD-10-CM | POA: Diagnosis not present

## 2014-10-26 DIAGNOSIS — I214 Non-ST elevation (NSTEMI) myocardial infarction: Secondary | ICD-10-CM | POA: Diagnosis not present

## 2014-10-26 DIAGNOSIS — Z789 Other specified health status: Secondary | ICD-10-CM | POA: Diagnosis not present

## 2014-10-26 DIAGNOSIS — Z48815 Encounter for surgical aftercare following surgery on the digestive system: Secondary | ICD-10-CM | POA: Diagnosis not present

## 2014-10-26 DIAGNOSIS — Z978 Presence of other specified devices: Secondary | ICD-10-CM

## 2014-10-26 DIAGNOSIS — R911 Solitary pulmonary nodule: Secondary | ICD-10-CM | POA: Diagnosis not present

## 2014-10-26 DIAGNOSIS — I429 Cardiomyopathy, unspecified: Secondary | ICD-10-CM

## 2014-10-26 DIAGNOSIS — Z4682 Encounter for fitting and adjustment of non-vascular catheter: Secondary | ICD-10-CM | POA: Diagnosis not present

## 2014-10-26 MED ORDER — LISINOPRIL 20 MG PO TABS: 20.0000 mg | ORAL_TABLET | Freq: Every day | ORAL | Status: DC

## 2014-10-26 MED ORDER — FUROSEMIDE 20 MG PO TABS: 20.0000 mg | ORAL_TABLET | Freq: Every day | ORAL | Status: DC

## 2014-10-26 MED ORDER — HYDRALAZINE HCL 50 MG PO TABS: 25.0000 mg | ORAL_TABLET | Freq: Three times a day (TID) | ORAL | Status: DC

## 2014-10-26 NOTE — Patient Instructions (Addendum)
Limit your sodium (Salt) intake  Increase lisinopril to 20 mg daily  Furosemide 20 mg daily  Decrease Apresoline to 25 mg 3 times daily  General surgery, cardiology and pulmonary follow-up  Return here as scheduled

## 2014-10-26 NOTE — Progress Notes (Signed)
Subjective:    Patient ID: Sally Douglas, female    DOB: 1945-04-22, 69 y.o.   MRN: [id number]  HPI Admit date: 10/16/2014 Discharge date: 10/23/2014  Admitting Diagnosis: Free intra-abdominal air Perforated gastric ulcer  Discharge Diagnosis Patient Active Problem List   Diagnosis Date Noted  . Acute respiratory failure 10/23/2014  . Troponin level elevated 10/23/2014  . Abnormal EKG 10/23/2014  . Abdominal pain   . Nonischemic cardiomyopathy   . Endotracheally intubated   . History of ETT   . Perforated gastric ulcer 10/16/2014  . Incarcerated ventral hernia, upper abdomen 11/27/2010  . Colonoscopy refused 11/27/2010  . TOBACCO ABUSE 07/15/2008  . Obesity, Class III, BMI 40-49.9 (morbid obesity) 10/08/2006  . BACK PAIN, CHRONIC 10/08/2006  . Essential hypertension 09/05/2006    Consultants Cardiology - Dr. Irish Lack, Dr. Sallyanne Kuster CCM - Dr. Ancil Linsey Internal Medicine - Dr. Clementeen Graham      69 year old patient who is seen following a recent hospital discharge. She was admitted for treatment of a perforated gastric ulcer. A CT abdominal scan revealed a incidental 11 mm right lower lobe pulmonary nodule. Echocardiogram revealed normal ejection fraction with apical akinesis and elevated troponins were also noted.  She is scheduled for cardiology follow-up in follow-up 2-D echocardiogram A pulmonary consultation has also been scheduled  She has a history of essential hypertension which previously had been well controlled on diuretic therapy only.  Hospital course was, gated by accelerated hypertension requiring multiple drugs.  Since her hospital discharge.  4 days ago.  She has had progressive pedal edema.  Home physical therapy has been initiated  Hospital records reviewed  Past Medical History  Diagnosis Date  . Insomnia     takes Ambien nightly  . Obesity   . Menopausal syndrome   . Tobacco abuse   . Low back pain   .  Bronchitis     hx of-many yrs ago  . Abnormal finding on Pap smear     hx abnl pap  . Hypertension     takes Diltiazem and HCTZ daily  . Constipation     takes Miralax daily  . Arthritis   . Joint pain   . Joint swelling     Social History   Social History  . Marital Status: Married    Spouse Name: N/A  . Number of Children: N/A  . Years of Education: N/A   Occupational History  . Not on file.   Social History Main Topics  . Smoking status: Current Every Day Smoker -- 0.25 packs/day for 25 years    Types: Cigarettes  . Smokeless tobacco: Never Used     Comment: Electronic Cigarettes  . Alcohol Use: No     Comment: occassional  . Drug Use: No  . Sexual Activity: Not Currently    Birth Control/ Protection: Post-menopausal   Other Topics Concern  . Not on file   Social History Narrative   gavidia 1   Para 1    aborta 0    Past Surgical History  Procedure Laterality Date  . Tonsillectomy    . Dilation and curettage of uterus    . Ventral hernia repair  01/24/2011    Procedure: LAPAROSCOPIC VENTRAL HERNIA;  Surgeon: Adin Hector, MD;  Location: [facility] ORS;  Service: General;  Laterality: N/A;  with Mesh  . Maximum access (mas)posterior lumbar interbody fusion (plif) 1 level N/A 11/04/2012    Procedure: Lumbar four-five Maximum access posterior lumbar interbody fusion with Nuvasive;  Surgeon: Eustace Moore, MD;  Location: [facility] OR[facility];  Service: Neurosurgery;  Laterality: N/A;  Lumbar four-five Maximum access posterior lumbar interbody fusion with Nuvasive  . Laparotomy N/A 10/16/2014    Procedure: EXPLORATORY LAPAROTOMY, REPAIR OF GASTRIC ULCER;  Surgeon: Georganna Skeans, MD;  Location: [facility] OR;  Service: General;  Laterality: N/A;    Family History  Problem Relation Age of Onset  . Cancer Brother     renal ,also sister  . COPD Brother   . Hypertension Brother   . Diabetes Brother   . Kidney disease Brother   . Diabetes Brother     and sister  . Breast cancer        sister  . Kidney disease    . Cancer Mother     Breast Cancer  . Diabetes Mother   . Hypertension Mother   . Glaucoma Mother   . Diabetes Sister   . Hypertension Sister   . Heart disease Sister     Allergies  Allergen Reactions  . K-Dur [Potassium Chloride] Swelling    Face Swelling  . Nsaids     H/o PUD with perforation    Current Outpatient Prescriptions on File Prior to Visit  Medication Sig Dispense Refill  . bisacodyl (DULCOLAX) 10 MG suppository Place 1 suppository (10 mg total) rectally daily as needed for mild constipation or moderate constipation.  0  . docusate sodium (COLACE) 100 MG capsule Take 1 capsule (100 mg total) by mouth 2 (two) times daily.  0  . feeding supplement, ENSURE ENLIVE, (ENSURE ENLIVE) LIQD Take 237 mLs by mouth 2 (two) times daily between meals. 237 mL 12  . HYDROcodone-acetaminophen (NORCO) 10-325 MG per tablet Take 0.5-2 tablets by mouth every 6 (six) hours as needed for moderate pain or severe pain (Prescribed by Dr. Maryjean Ka). 40 tablet 0  . isosorbide mononitrate (IMDUR) 30 MG 24 hr tablet Take 1 tablet (30 mg total) by mouth daily. 30 tablet 0  . levofloxacin (LEVAQUIN) 750 MG tablet Take 1 tablet (750 mg total) by mouth every other day. 3 tablet 0  . metoprolol (LOPRESSOR) 50 MG tablet Take 1 tablet (50 mg total) by mouth 2 (two) times daily. 60 tablet 0  . nortriptyline (PAMELOR) 25 MG capsule Take 1-2 capsules (25-50 mg total) by mouth at bedtime. 180 capsule 1  . pantoprazole (PROTONIX) 40 MG tablet Take 1 tablet (40 mg total) by mouth 2 (two) times daily before a meal. 60 tablet 0  . polyethylene glycol powder (GLYCOLAX/MIRALAX) powder Take 17 g by mouth daily as needed for moderate constipation.     [name] tiZANidine (ZANAFLEX) 4 MG tablet Take 4 mg by mouth every 8 (eight) hours as needed for muscle spasms.    . VOLTAREN 1 % GEL Apply 1 application topically daily as needed. FOR HANDS  2  . zolpidem (AMBIEN) 10 MG tablet TAKE 1 TABLET BY  MOUTH AT BEDTIME AS NEEDED FOR SLEEP (Patient taking differently: TAKE 1 TABLET BY MOUTH AT BEDTIME FOR SLEEP) 30 tablet 2   No current facility-administered medications on file prior to visit.    BP 130/80 mmHg  Pulse 75  Temp(Src) 98.2 F (36.8 C) (Oral)  Resp 20  Ht 4\' 8"  (1.422 m)  Wt 170 lb (77.111 kg)  BMI 38.13 kg/m2  SpO2 96%      Review of Systems  Constitutional: Positive for fatigue.  HENT: Negative for congestion, dental problem, hearing loss, rhinorrhea, sinus pressure, sore throat and tinnitus.  Eyes: Negative for pain, discharge and visual disturbance.  Respiratory: Negative for cough and shortness of breath.   Cardiovascular: Positive for leg swelling. Negative for chest pain and palpitations.  Gastrointestinal: Negative for nausea, vomiting, abdominal pain, diarrhea, constipation, blood in stool and abdominal distention.  Genitourinary: Negative for dysuria, urgency, frequency, hematuria, flank pain, vaginal bleeding, vaginal discharge, difficulty urinating, vaginal pain and pelvic pain.  Musculoskeletal: Negative for joint swelling, arthralgias and gait problem.  Skin: Negative for rash.  Neurological: Positive for weakness. Negative for dizziness, syncope, speech difficulty, numbness and headaches.  Hematological: Negative for adenopathy.  Psychiatric/Behavioral: Negative for behavioral problems, dysphoric mood and agitation. The patient is not nervous/anxious.        Objective:   Physical Exam  Constitutional: She is oriented to person, place, and time. She appears well-developed and well-nourished.  Blood pressure on arrival 1:30 over 80 Repeat blood pressure 160/80  Obese Clinically, looks well No tachycardia O2 saturation 96  HENT:  Head: Normocephalic.  Right Ear: External ear normal.  Left Ear: External ear normal.  Mouth/Throat: Oropharynx is clear and moist.  Eyes: Conjunctivae and EOM are normal. Pupils are equal, round, and reactive to  light.  Neck: Normal range of motion. Neck supple. No thyromegaly present.  Cardiovascular: Normal rate, regular rhythm, normal heart sounds and intact distal pulses.   Pulmonary/Chest: Effort normal and breath sounds normal. She has no rales.  Abdominal: Soft. Bowel sounds are normal. She exhibits no mass. There is no tenderness.  Open surgical wound with surgical drain  Musculoskeletal: Normal range of motion. She exhibits edema.  Lymphadenopathy:    She has no cervical adenopathy.  Neurological: She is alert and oriented to person, place, and time.  Skin: Skin is warm and dry. No rash noted.  Psychiatric: She has a normal mood and affect. Her behavior is normal.          Assessment & Plan:   Hypertension.  Reasonable control on multiple drugs but worsening pedal edema.  Multifactorial but aggravated by apresoline.  Will stress.  Low-salt diet and place on low-dose furosemide. Status post surgery for perforated gastric ulcer.  General surgery follow-up 11 mm right lower lobe pulmonary nodule in high risk patient.  Pulmonary follow-up as scheduled.  Will need PET scanning History tobacco abuse.  Presently abstinent History of apical akinesis with normal ejection fraction.  Cardiology follow-up and repeat echo pending

## 2014-10-26 NOTE — Progress Notes (Signed)
Pre visit review using our clinic review tool, if applicable. No additional management support is needed unless otherwise documented below in the visit note. 

## 2014-10-27 DIAGNOSIS — I214 Non-ST elevation (NSTEMI) myocardial infarction: Secondary | ICD-10-CM | POA: Diagnosis not present

## 2014-10-27 DIAGNOSIS — Z4682 Encounter for fitting and adjustment of non-vascular catheter: Secondary | ICD-10-CM | POA: Diagnosis not present

## 2014-10-27 DIAGNOSIS — Z48815 Encounter for surgical aftercare following surgery on the digestive system: Secondary | ICD-10-CM | POA: Diagnosis not present

## 2014-10-27 DIAGNOSIS — Z6841 Body Mass Index (BMI) 40.0 and over, adult: Secondary | ICD-10-CM | POA: Diagnosis not present

## 2014-10-27 DIAGNOSIS — I10 Essential (primary) hypertension: Secondary | ICD-10-CM | POA: Diagnosis not present

## 2014-10-28 DIAGNOSIS — Z9889 Other specified postprocedural states: Secondary | ICD-10-CM | POA: Diagnosis not present

## 2014-10-28 DIAGNOSIS — I214 Non-ST elevation (NSTEMI) myocardial infarction: Secondary | ICD-10-CM | POA: Diagnosis not present

## 2014-10-28 DIAGNOSIS — Z6841 Body Mass Index (BMI) 40.0 and over, adult: Secondary | ICD-10-CM | POA: Diagnosis not present

## 2014-10-28 DIAGNOSIS — Z4682 Encounter for fitting and adjustment of non-vascular catheter: Secondary | ICD-10-CM | POA: Diagnosis not present

## 2014-10-28 DIAGNOSIS — Z48815 Encounter for surgical aftercare following surgery on the digestive system: Secondary | ICD-10-CM | POA: Diagnosis not present

## 2014-10-28 DIAGNOSIS — I10 Essential (primary) hypertension: Secondary | ICD-10-CM | POA: Diagnosis not present

## 2014-10-29 DIAGNOSIS — Z4682 Encounter for fitting and adjustment of non-vascular catheter: Secondary | ICD-10-CM | POA: Diagnosis not present

## 2014-10-29 DIAGNOSIS — Z48815 Encounter for surgical aftercare following surgery on the digestive system: Secondary | ICD-10-CM | POA: Diagnosis not present

## 2014-10-29 DIAGNOSIS — I10 Essential (primary) hypertension: Secondary | ICD-10-CM | POA: Diagnosis not present

## 2014-10-29 DIAGNOSIS — Z6841 Body Mass Index (BMI) 40.0 and over, adult: Secondary | ICD-10-CM | POA: Diagnosis not present

## 2014-10-29 DIAGNOSIS — I214 Non-ST elevation (NSTEMI) myocardial infarction: Secondary | ICD-10-CM | POA: Diagnosis not present

## 2014-10-31 ENCOUNTER — Telehealth: Payer: Self-pay | Admitting: Internal Medicine

## 2014-10-31 DIAGNOSIS — I10 Essential (primary) hypertension: Secondary | ICD-10-CM | POA: Diagnosis not present

## 2014-10-31 DIAGNOSIS — Z48815 Encounter for surgical aftercare following surgery on the digestive system: Secondary | ICD-10-CM | POA: Diagnosis not present

## 2014-10-31 DIAGNOSIS — Z6841 Body Mass Index (BMI) 40.0 and over, adult: Secondary | ICD-10-CM | POA: Diagnosis not present

## 2014-10-31 DIAGNOSIS — I214 Non-ST elevation (NSTEMI) myocardial infarction: Secondary | ICD-10-CM | POA: Diagnosis not present

## 2014-10-31 DIAGNOSIS — Z4682 Encounter for fitting and adjustment of non-vascular catheter: Secondary | ICD-10-CM | POA: Diagnosis not present

## 2014-10-31 MED ORDER — FLUCONAZOLE 200 MG PO TABS: 200.0000 mg | ORAL_TABLET | Freq: Every day | ORAL | Status: DC

## 2014-10-31 NOTE — Telephone Encounter (Signed)
Diflucan 200 mg  #14  One daily

## 2014-10-31 NOTE — Telephone Encounter (Signed)
Please see message and advise 

## 2014-10-31 NOTE — Telephone Encounter (Signed)
Spoke to pt, told her Rx for Diflucan 200 mg tablet, one tablet daily x 14 days sent to pharmacy. Pt verbalized understanding.

## 2014-10-31 NOTE — Telephone Encounter (Signed)
Sally Douglas is calling to report pt has thrush in mouth. [facility] [address] Abbott Pao can not eat her whole mouth is covered

## 2014-11-01 DIAGNOSIS — I214 Non-ST elevation (NSTEMI) myocardial infarction: Secondary | ICD-10-CM | POA: Diagnosis not present

## 2014-11-01 DIAGNOSIS — Z6841 Body Mass Index (BMI) 40.0 and over, adult: Secondary | ICD-10-CM | POA: Diagnosis not present

## 2014-11-01 DIAGNOSIS — Z48815 Encounter for surgical aftercare following surgery on the digestive system: Secondary | ICD-10-CM | POA: Diagnosis not present

## 2014-11-01 DIAGNOSIS — I10 Essential (primary) hypertension: Secondary | ICD-10-CM | POA: Diagnosis not present

## 2014-11-01 DIAGNOSIS — Z4682 Encounter for fitting and adjustment of non-vascular catheter: Secondary | ICD-10-CM | POA: Diagnosis not present

## 2014-11-02 DIAGNOSIS — Z6841 Body Mass Index (BMI) 40.0 and over, adult: Secondary | ICD-10-CM | POA: Diagnosis not present

## 2014-11-02 DIAGNOSIS — Z4682 Encounter for fitting and adjustment of non-vascular catheter: Secondary | ICD-10-CM | POA: Diagnosis not present

## 2014-11-02 DIAGNOSIS — I10 Essential (primary) hypertension: Secondary | ICD-10-CM | POA: Diagnosis not present

## 2014-11-02 DIAGNOSIS — I214 Non-ST elevation (NSTEMI) myocardial infarction: Secondary | ICD-10-CM | POA: Diagnosis not present

## 2014-11-02 DIAGNOSIS — Z48815 Encounter for surgical aftercare following surgery on the digestive system: Secondary | ICD-10-CM | POA: Diagnosis not present

## 2014-11-03 ENCOUNTER — Other Ambulatory Visit (HOSPITAL_COMMUNITY): Payer: Medicare Other

## 2014-11-04 ENCOUNTER — Telehealth: Payer: Self-pay | Admitting: Internal Medicine

## 2014-11-04 DIAGNOSIS — I10 Essential (primary) hypertension: Secondary | ICD-10-CM | POA: Diagnosis not present

## 2014-11-04 DIAGNOSIS — Z6841 Body Mass Index (BMI) 40.0 and over, adult: Secondary | ICD-10-CM | POA: Diagnosis not present

## 2014-11-04 DIAGNOSIS — I214 Non-ST elevation (NSTEMI) myocardial infarction: Secondary | ICD-10-CM | POA: Diagnosis not present

## 2014-11-04 DIAGNOSIS — Z4682 Encounter for fitting and adjustment of non-vascular catheter: Secondary | ICD-10-CM | POA: Diagnosis not present

## 2014-11-04 DIAGNOSIS — Z48815 Encounter for surgical aftercare following surgery on the digestive system: Secondary | ICD-10-CM | POA: Diagnosis not present

## 2014-11-04 NOTE — Telephone Encounter (Signed)
Left a message for return call.  

## 2014-11-04 NOTE — Telephone Encounter (Signed)
Stacy with [facility] a physical therapy call to say pt is nausea and not eating  And has been like this over a week. Pt has thrush and is still having pain in her mouth Would like a call back  [place] [phone]

## 2014-11-04 NOTE — Telephone Encounter (Signed)
Please call in a prescription for Magic mouthwash 6 ounces 1 teaspoon swish and swallow 4 times daily Please call in a prescription for Phenergan 25 mg #30 one every 6 hours as needed for nausea  Return office visit Monday if unimproved

## 2014-11-07 ENCOUNTER — Ambulatory Visit: Payer: Medicare Other | Admitting: Internal Medicine

## 2014-11-07 DIAGNOSIS — R110 Nausea: Secondary | ICD-10-CM | POA: Diagnosis not present

## 2014-11-07 DIAGNOSIS — R918 Other nonspecific abnormal finding of lung field: Secondary | ICD-10-CM | POA: Diagnosis not present

## 2014-11-07 DIAGNOSIS — F17210 Nicotine dependence, cigarettes, uncomplicated: Secondary | ICD-10-CM | POA: Diagnosis not present

## 2014-11-07 DIAGNOSIS — I214 Non-ST elevation (NSTEMI) myocardial infarction: Secondary | ICD-10-CM | POA: Diagnosis not present

## 2014-11-07 DIAGNOSIS — R0989 Other specified symptoms and signs involving the circulatory and respiratory systems: Secondary | ICD-10-CM | POA: Diagnosis not present

## 2014-11-07 DIAGNOSIS — Z48815 Encounter for surgical aftercare following surgery on the digestive system: Secondary | ICD-10-CM | POA: Diagnosis not present

## 2014-11-07 DIAGNOSIS — Z4682 Encounter for fitting and adjustment of non-vascular catheter: Secondary | ICD-10-CM | POA: Diagnosis not present

## 2014-11-07 DIAGNOSIS — Z6841 Body Mass Index (BMI) 40.0 and over, adult: Secondary | ICD-10-CM | POA: Diagnosis not present

## 2014-11-07 DIAGNOSIS — K439 Ventral hernia without obstruction or gangrene: Secondary | ICD-10-CM | POA: Diagnosis not present

## 2014-11-07 DIAGNOSIS — J9811 Atelectasis: Secondary | ICD-10-CM | POA: Diagnosis not present

## 2014-11-07 DIAGNOSIS — Z79899 Other long term (current) drug therapy: Secondary | ICD-10-CM | POA: Diagnosis not present

## 2014-11-07 DIAGNOSIS — I10 Essential (primary) hypertension: Secondary | ICD-10-CM | POA: Diagnosis not present

## 2014-11-07 DIAGNOSIS — Z886 Allergy status to analgesic agent status: Secondary | ICD-10-CM | POA: Diagnosis not present

## 2014-11-07 DIAGNOSIS — Z8711 Personal history of peptic ulcer disease: Secondary | ICD-10-CM | POA: Diagnosis not present

## 2014-11-07 DIAGNOSIS — Z888 Allergy status to other drugs, medicaments and biological substances status: Secondary | ICD-10-CM | POA: Diagnosis not present

## 2014-11-07 DIAGNOSIS — D259 Leiomyoma of uterus, unspecified: Secondary | ICD-10-CM | POA: Diagnosis not present

## 2014-11-07 DIAGNOSIS — E860 Dehydration: Secondary | ICD-10-CM | POA: Diagnosis not present

## 2014-11-07 DIAGNOSIS — R140 Abdominal distension (gaseous): Secondary | ICD-10-CM | POA: Diagnosis not present

## 2014-11-07 DIAGNOSIS — R911 Solitary pulmonary nodule: Secondary | ICD-10-CM | POA: Diagnosis not present

## 2014-11-07 DIAGNOSIS — Z9889 Other specified postprocedural states: Secondary | ICD-10-CM | POA: Diagnosis not present

## 2014-11-07 DIAGNOSIS — N281 Cyst of kidney, acquired: Secondary | ICD-10-CM | POA: Diagnosis not present

## 2014-11-07 MED ORDER — MAGIC MOUTHWASH W/LIDOCAINE: 5.0000 mL | Freq: Four times a day (QID) | ORAL | Status: DC

## 2014-11-07 MED ORDER — PROMETHAZINE HCL 25 MG PO TABS: 25.0000 mg | ORAL_TABLET | Freq: Four times a day (QID) | ORAL | Status: DC | PRN

## 2014-11-07 NOTE — Telephone Encounter (Signed)
Spoke to pt, told her sending Rx's to the pharmacy for [place] to help your mouth and Phenergan for nausea. Told pt if not better by Wed AM Dr.K wants to see you and to call. Pt verbalized understanding. Rx for Mouthwash faxed to pharmacy and other one sent.

## 2014-11-09 DIAGNOSIS — Z48815 Encounter for surgical aftercare following surgery on the digestive system: Secondary | ICD-10-CM | POA: Diagnosis not present

## 2014-11-09 DIAGNOSIS — I214 Non-ST elevation (NSTEMI) myocardial infarction: Secondary | ICD-10-CM | POA: Diagnosis not present

## 2014-11-09 DIAGNOSIS — Z4682 Encounter for fitting and adjustment of non-vascular catheter: Secondary | ICD-10-CM | POA: Diagnosis not present

## 2014-11-09 DIAGNOSIS — I10 Essential (primary) hypertension: Secondary | ICD-10-CM | POA: Diagnosis not present

## 2014-11-09 DIAGNOSIS — Z6841 Body Mass Index (BMI) 40.0 and over, adult: Secondary | ICD-10-CM | POA: Diagnosis not present

## 2014-11-10 ENCOUNTER — Telehealth: Payer: Self-pay | Admitting: Internal Medicine

## 2014-11-10 ENCOUNTER — Telehealth: Payer: Self-pay | Admitting: Interventional Cardiology

## 2014-11-10 DIAGNOSIS — Z48815 Encounter for surgical aftercare following surgery on the digestive system: Secondary | ICD-10-CM | POA: Diagnosis not present

## 2014-11-10 NOTE — Telephone Encounter (Addendum)
The pts husband states that the pt had surgery for perforated gastric ulcer about a month ago. He reports that since she has been home all she does is sleep and she will not eat because she is nauseated. The pts husband states that he took the pt back to the ER this past Monday night due to her sleeping/fatigue/nausea and not eating and was advised to stop taking her stool softener, Myralax and muscle relaxer and to consult with her PCP and her cardiologist as the ER MD thinks that her medications are causing her problems. The pts husband states that he has called her PCP but has not heard back yet and he is very concerned about her. Please advise.

## 2014-11-10 NOTE — Telephone Encounter (Signed)
Husband states she went to the ED on Monday.  Pt would like to see Dr Raliegh Ip on Friday. .  Advised husband if pt gets worse to go to the ED.  He states they just went in Monday, and believes it could possibly be a med dr K put her on. Husband would like to know about an appointment asap so he can take off work.  Advised dr Raliegh Ip   is out of the office today and I will need his approval to add on another patient to his full schedule. Husband verbalized understanding

## 2014-11-10 NOTE — Telephone Encounter (Addendum)
The pts husband is advised of below message from Dr Irish Lack. He states that he was advised by the pts surgeon yesterday that her s/s are not related to her surgery and that it is related to a heart medication that she is taking. I asked him which medication it was and he did not know. I asked if he had contacted the pts PCP and he said "yes I have and we are finding a new one because Im firing Dr. Burnice Logan". Also, he stated that if anything happens to his wife he is going to sue all of her doctors. He wants to know why his wife is even seeing a cardiologist because they went to the hospital for her GI issues. I advised him that while she was in the hospital they did a lot of tests and most likely cardiology was called in due to a test result. Then he stated that cardiology was sent to her room due to a CT scan. I again advised him to contact his wifes surgeon if for no other reason but to ask which medication the surgeon feels is causing her fatigue, nausea and not eating as these s/s are not likely cardiac related after her surgery. Sometime while I was talking he hung up the phone.  While reviewing the pts chart I read over all note from today at Dr Truddie Hidden office as well. According to a phone note the husband told them that he took her to the ER Monday night for dehydration, nausea, and pneumonia. Also, according to the note Dr Truddie Hidden office was to offer her an appointment for tomorrow.

## 2014-11-10 NOTE — Telephone Encounter (Signed)
The pts husband called back for advise about the pts fatigue, nausea and not eating. He is advised that Dr Irish Lack is the DOD today and that after clinic today we will discuss the pts condition and call him back. He verbalized understanding.

## 2014-11-10 NOTE — Telephone Encounter (Signed)
Patient Name: Sally Douglas  DOB: [birth year]    Initial Comment Caller states wife had surgery 3 wks ago, hasn't eaten x 1 wk and wants to sleep all the time, caller thinks it's related to all the medicine she is on.   Nurse Assessment  Nurse: Raphael Gibney, RN, Vanita Ingles Date/Time (Eastern Time): 11/10/2014 9:06:30 AM  Confirm and document reason for call. If symptomatic, describe symptoms. ---Caller states spouse had surgery 3.5 weeks ago. She had a perforated ulcer and had emergency surgery. Her BP medication was changed. Went back to the ER on Monday for dehydration, nausea, and pneumonia. Saw surgeon yesterday and the surgeon said she should be up and eating. Has not eaten anything in a week and half. She can hardly walk. She is very weak. He has already called the heart doctor and is waiting a call back. Surgeon thinks it might due to her new medication for BP and one her cardiologist put her on. Surgeon discontinued her stool softener, miralax, and muscle relaxer. She is wanting to sleep all the time. Does not know the name of the new BP medication. She is drinking very little. Having diarrhea and vomiting. No fever.  Has the patient traveled out of the country within the last 30 days? ---Not Applicable  Does the patient have any new or worsening symptoms? ---Yes  Will a triage be completed? ---Yes  Related visit to physician within the last 2 weeks? ---Yes  Does the PT have any chronic conditions? (i.e. diabetes, asthma, etc.) ---Yes  List chronic conditions. ---HTN; perforated ulcer recently     Guidelines    Guideline Title Affirmed Question Affirmed Notes  Diarrhea [1] Drinking very little AND [2] dehydration suspected (e.g., no urine > 12 hours, very dry mouth, very lightheaded)    Final Disposition User   Go to ED Now (or PCP triage) Raphael Gibney, RN, [place]    Comments  Spouse is at work and he does not know the name of her medication. Says he does not want to take her to the ER as they were at the  surgeon yesterday and the surgeon thinks her decreased appetite and vomiting is due to her new medication. Says he wants a call back regarding her new BP medication as the surgeon thinks it is her new medication that is causing the problem. He is also waiting on a call from the cardiologist Dr. Irish Lack regarding the medication that he ordered.  Called back line at office and spoke to [place] regarding ER outcome and spouse not wanting to take her to the ER but wanting someone to call him back regarding her medication. States someone will call spouse back.  Please call spouse at [phone].   Referrals  GO TO FACILITY REFUSED   Disagree/Comply: Disagree  Disagree/Comply Reason: Disagree with instructions

## 2014-11-10 NOTE — Telephone Encounter (Signed)
Follow up  ° ° °Patient calling back to speak with nurse  °

## 2014-11-10 NOTE — Telephone Encounter (Signed)
Spoke with pt husband and gave below message, transferred to scheduling to make an appointment with Dr. Raliegh Ip for tomorrow.

## 2014-11-10 NOTE — Telephone Encounter (Signed)
New Message  Pt husband calling to speak w/ Rn concerning pt recent condition. Pt husband stated that recently pt has been very tired, dehydrated and weak- sometimes to the point that she can not walk. Pt has been to the ED- and per pt husband- the ED MD's believe it has to do w/ a conflict with her BP medication and the medications prescribed by herPCP. Please call back and discuss.

## 2014-11-10 NOTE — Telephone Encounter (Signed)
Appears hydralazine was stopped in hospital when hctz was stopped  Dr. Raliegh Ip started lasix back in late September which was held in hospital  Time course of not feeling well seems to correspond more with lasix. Have her stop this medication and get in to see Dr. Raliegh Ip tomorrow for further evaluation since she is declining ED

## 2014-11-10 NOTE — Telephone Encounter (Signed)
Most lkely related to her surgery and not her heart.  WOuld check with surgeon.  Plan for echo as already scheduled.

## 2014-11-10 NOTE — Telephone Encounter (Signed)
See below

## 2014-11-11 ENCOUNTER — Encounter: Payer: Self-pay | Admitting: Internal Medicine

## 2014-11-11 ENCOUNTER — Telehealth: Payer: Self-pay | Admitting: Internal Medicine

## 2014-11-11 ENCOUNTER — Ambulatory Visit (INDEPENDENT_AMBULATORY_CARE_PROVIDER_SITE_OTHER): Payer: Medicare Other | Admitting: Internal Medicine

## 2014-11-11 ENCOUNTER — Ambulatory Visit (INDEPENDENT_AMBULATORY_CARE_PROVIDER_SITE_OTHER)
Admission: RE | Admit: 2014-11-11 | Discharge: 2014-11-11 | Disposition: A | Discharge status: Home / Self Care | Payer: Medicare Other | Source: Ambulatory Visit | Attending: Internal Medicine | Admitting: Internal Medicine

## 2014-11-11 VITALS — BP 106/68 | HR 85 | Temp 98.4°F | Ht <= 58 in | Wt 149.5 lb

## 2014-11-11 DIAGNOSIS — R197 Diarrhea, unspecified: Secondary | ICD-10-CM

## 2014-11-11 DIAGNOSIS — R7989 Other specified abnormal findings of blood chemistry: Secondary | ICD-10-CM | POA: Diagnosis not present

## 2014-11-11 DIAGNOSIS — I10 Essential (primary) hypertension: Secondary | ICD-10-CM

## 2014-11-11 DIAGNOSIS — K255 Chronic or unspecified gastric ulcer with perforation: Secondary | ICD-10-CM

## 2014-11-11 DIAGNOSIS — Z6841 Body Mass Index (BMI) 40.0 and over, adult: Secondary | ICD-10-CM | POA: Diagnosis not present

## 2014-11-11 DIAGNOSIS — R911 Solitary pulmonary nodule: Secondary | ICD-10-CM

## 2014-11-11 DIAGNOSIS — Z4682 Encounter for fitting and adjustment of non-vascular catheter: Secondary | ICD-10-CM | POA: Diagnosis not present

## 2014-11-11 DIAGNOSIS — I214 Non-ST elevation (NSTEMI) myocardial infarction: Secondary | ICD-10-CM | POA: Diagnosis not present

## 2014-11-11 DIAGNOSIS — A09 Infectious gastroenteritis and colitis, unspecified: Secondary | ICD-10-CM

## 2014-11-11 DIAGNOSIS — R778 Other specified abnormalities of plasma proteins: Secondary | ICD-10-CM

## 2014-11-11 DIAGNOSIS — Z48815 Encounter for surgical aftercare following surgery on the digestive system: Secondary | ICD-10-CM | POA: Diagnosis not present

## 2014-11-11 LAB — COMPREHENSIVE METABOLIC PANEL
ALBUMIN: 3.4 g/dL — AB (ref 3.5–5.2)
ALK PHOS: 84 U/L (ref 39–117)
ALT: 24 U/L (ref 0–35)
AST: 17 U/L (ref 0–37)
BILIRUBIN TOTAL: 0.3 mg/dL (ref 0.2–1.2)
BUN: 22 mg/dL (ref 6–23)
CALCIUM: 9.5 mg/dL (ref 8.4–10.5)
CHLORIDE: 102 meq/L (ref 96–112)
CO2: 26 mEq/L (ref 19–32)
CREATININE: 1.15 mg/dL (ref 0.40–1.20)
GFR: 49.65 mL/min — ABNORMAL LOW (ref >60.00)
Glucose, Bld: 121 mg/dL — ABNORMAL HIGH (ref 70–99)
Potassium: 4.8 mEq/L (ref 3.5–5.1)
SODIUM: 138 meq/L (ref 135–145)
TOTAL PROTEIN: 7.1 g/dL (ref 6.0–8.3)

## 2014-11-11 LAB — CBC WITH DIFFERENTIAL/PLATELET
BASOS ABS: 0.1 10*3/uL (ref 0.0–0.1)
BASOS PCT: 0.5 % (ref 0.0–3.0)
EOS ABS: 0.6 10*3/uL (ref 0.0–0.7)
Eosinophils Relative: 5.2 % — ABNORMAL HIGH (ref 0.0–5.0)
HCT: 34.5 % — ABNORMAL LOW (ref 36.0–46.0)
HEMOGLOBIN: 11.4 g/dL — AB (ref 12.0–15.0)
LYMPHS PCT: 16.6 % (ref 12.0–46.0)
Lymphs Abs: 1.9 10*3/uL (ref 0.7–4.0)
MCHC: 33 g/dL (ref 30.0–36.0)
MCV: 89.7 fl (ref 78.0–100.0)
MONO ABS: 1.1 10*3/uL — AB (ref 0.1–1.0)
Monocytes Relative: 9.5 % (ref 3.0–12.0)
Neutro Abs: 7.8 10*3/uL — ABNORMAL HIGH (ref 1.4–7.7)
Neutrophils Relative %: 68.2 % (ref 43.0–77.0)
Platelets: 287 10*3/uL (ref 150.0–400.0)
RBC: 3.84 Mil/uL — ABNORMAL LOW (ref 3.87–5.11)
RDW: 14.9 % (ref 11.5–15.5)
WBC: 11.4 10*3/uL — AB (ref 4.0–10.5)

## 2014-11-11 MED ORDER — METRONIDAZOLE 500 MG PO TABS
500.0000 mg | ORAL_TABLET | Freq: Three times a day (TID) | ORAL | Status: DC
Start: 2014-11-11 — End: 2014-11-17

## 2014-11-11 MED ORDER — METOPROLOL TARTRATE 50 MG PO TABS: 25.0000 mg | ORAL_TABLET | Freq: Two times a day (BID) | ORAL | Status: DC

## 2014-11-11 NOTE — Telephone Encounter (Signed)
Late entry: The pt hung up the phone during our conversation yesterday when I recommended that he take his wife back to the ER or Urgent care as this office was closed at time I was speaking to him last evening.

## 2014-11-11 NOTE — Telephone Encounter (Signed)
stacie from [facility] is calling to get pt schedule for an appt today. Pt is having ongoing nausea. stacie talk with dr Raliegh Ip last week

## 2014-11-11 NOTE — Progress Notes (Signed)
Pre visit review using our clinic review tool, if applicable. No additional management support is needed unless otherwise documented below in the visit note. 

## 2014-11-11 NOTE — Progress Notes (Signed)
Subjective:    Patient ID: Sally Douglas, female    DOB: [birth year], 69 y.o.   MRN: [id number]  HPI  69 year old patient who was admitted to the hospital from September 18 through September 25 for treatment of a perforated gastric ulcer and secondary complications. Evaluation at that time included a 2-D echocardiogram revealed atypical akinesis; the patient was also noted have an elevated troponin level and the patient is scheduled for cardiac evaluation and follow-up echocardiogram She is also noted have an incidental right lower lobe 11 mm pulmonary nodule and is scheduled for pulmonary follow-up.  Hospital course was complicated by accelerated hypertension  The patient was seen in follow-up here on September 25.  At that time she was receiving home physical therapy.  She is noted have increasing pedal edema and she was placed on low-dose furosemide and apresoline  and was discontinued.  This medication.  Remains on her medication list, however  Phone contact for made to our office on October 3 in October 7 with complaints of mouth pain associated with exudative lesions.  The patient has been treated for possible thrush with Diflucan and more recently Magic mouthwash.  Her mouth pain has improved, although she still complains some dryness of the lips  On October 10.  She was seen at [facility]  emergency room.  She was treated for possible pneumonia and received Rocephin as well as azithromycin IV.  She was discharged on Levaquin, but it is unclear whether she is taking this medication.  The wound was 27 with creatinine 1.1.  White count was elevated slightly at 12 point 2.  Urinalysis was unremarkable, but apparently she has been contacted recently for antibiotic for a UTI  On October 12.  She was seen in follow-up by general surgery who felt that she is having a satisfactory postop course.  Her wound was felt to be healing well and surgery did not feel that she was having a postoperative issue.   There was some concern of polypharmacy. Furosemide has been discontinued  The patient continues to feel poorly with anorexia and nausea.  She has had some diarrhea throughout the week that has become much more severe over the past 1 or 2 days.  Denies any fever.  Wt Readings from Last 3 Encounters:  11/11/14 149 lb 8 oz (67.813 kg)  10/26/14 170 lb (77.111 kg)  10/16/14 165 lb (74.844 kg)    Past Medical History  Diagnosis Date  . Insomnia     takes Ambien nightly  . Obesity   . Menopausal syndrome   . Tobacco abuse   . Low back pain   . Bronchitis     hx of-many yrs ago  . Abnormal finding on Pap smear     hx abnl pap  . Hypertension     takes Diltiazem and HCTZ daily  . Constipation     takes Miralax daily  . Arthritis   . Joint pain   . Joint swelling     Social History   Social History  . Marital Status: Married    Spouse Name: N/A  . Number of Children: N/A  . Years of Education: N/A   Occupational History  . Not on file.   Social History Main Topics  . Smoking status: Current Every Day Smoker -- 0.25 packs/day for 25 years    Types: Cigarettes  . Smokeless tobacco: Never Used     Comment: Electronic Cigarettes  . Alcohol Use: No  Comment: occassional  . Drug Use: No  . Sexual Activity: Not Currently    Birth Control/ Protection: Post-menopausal   Other Topics Concern  . Not on file   Social History Narrative   gavidia 1   Para 1    aborta 0    Past Surgical History  Procedure Laterality Date  . Tonsillectomy    . Dilation and curettage of uterus    . Ventral hernia repair  01/24/2011    Procedure: LAPAROSCOPIC VENTRAL HERNIA;  Surgeon: Adin Hector, MD;  Location: [facility];  Service: General;  Laterality: N/A;  with Mesh  . Maximum access (mas)posterior lumbar interbody fusion (plif) 1 level N/A 11/04/2012    Procedure: Lumbar four-five Maximum access posterior lumbar interbody fusion with Nuvasive;  Surgeon: Eustace Moore, MD;  Location:  [place] [facility];  Service: Neurosurgery;  Laterality: N/A;  Lumbar four-five Maximum access posterior lumbar interbody fusion with Nuvasive  . Laparotomy N/A 10/16/2014    Procedure: EXPLORATORY LAPAROTOMY, REPAIR OF GASTRIC ULCER;  Surgeon: Georganna Skeans, MD;  Location: [facility];  Service: General;  Laterality: N/A;    Family History  Problem Relation Age of Onset  . Cancer Brother     renal ,also sister  . COPD Brother   . Hypertension Brother   . Diabetes Brother   . Kidney disease Brother   . Diabetes Brother     and sister  . Breast cancer      sister  . Kidney disease    . Cancer Mother     Breast Cancer  . Diabetes Mother   . Hypertension Mother   . Glaucoma Mother   . Diabetes Sister   . Hypertension Sister   . Heart disease Sister     Allergies  Allergen Reactions  . K-Dur [Potassium Chloride] Swelling    Face Swelling  . Nsaids     H/o PUD with perforation    Current Outpatient Prescriptions on File Prior to Visit  Medication Sig Dispense Refill  . feeding supplement, ENSURE ENLIVE, (ENSURE ENLIVE) LIQD Take 237 mLs by mouth 2 (two) times daily between meals. 237 mL 12  . fluconazole (DIFLUCAN) 200 MG tablet Take 1 tablet (200 mg total) by mouth daily. 14 tablet 0  . HYDROcodone-acetaminophen (NORCO) 10-325 MG per tablet Take 0.5-2 tablets by mouth every 6 (six) hours as needed for moderate pain or severe pain (Prescribed by Dr. Maryjean Ka). 40 tablet 0  . pantoprazole (PROTONIX) 40 MG tablet Take 1 tablet (40 mg total) by mouth 2 (two) times daily before a meal. 60 tablet 0  . VOLTAREN 1 % GEL Apply 1 application topically daily as needed. FOR HANDS  2   No current facility-administered medications on file prior to visit.    BP 106/68 mmHg  Pulse 85  Temp(Src) 98.4 F (36.9 C) (Oral)  Ht 4\' 8"  (1.422 m)  Wt 149 lb 8 oz (67.813 kg)  BMI 33.54 kg/m2     Review of Systems  Constitutional: Positive for activity change, appetite change and fatigue. Negative for  fever, chills and diaphoresis.  HENT: Negative for congestion, dental problem, hearing loss, rhinorrhea, sinus pressure, sore throat and tinnitus.   Eyes: Negative for pain, discharge and visual disturbance.  Respiratory: Positive for wheezing. Negative for cough and shortness of breath.   Cardiovascular: Negative for chest pain, palpitations and leg swelling.  Gastrointestinal: Positive for nausea and diarrhea. Negative for vomiting, abdominal pain, constipation, blood in stool and abdominal distention.  Genitourinary: Negative for dysuria, urgency, frequency, hematuria, flank pain, vaginal bleeding, vaginal discharge, difficulty urinating, vaginal pain and pelvic pain.  Musculoskeletal: Negative for joint swelling, arthralgias and gait problem.  Skin: Negative for rash.  Neurological: Positive for weakness. Negative for dizziness, syncope, speech difficulty, numbness and headaches.  Hematological: Negative for adenopathy.  Psychiatric/Behavioral: Negative for behavioral problems, dysphoric mood and agitation. The patient is not nervous/anxious.        Objective:   Physical Exam  Constitutional: She is oriented to person, place, and time. She appears well-developed and well-nourished.  Appears weak and deconditioned, but in no acute distress blood pressure on arrival 106 over 68 Repeat blood pressure 130/70 Pulse 85 Temperature 98.4  HENT:  Head: Normocephalic.  Right Ear: External ear normal.  Left Ear: External ear normal.  Mouth/Throat: Oropharynx is clear and moist.  Eyes: Conjunctivae and EOM are normal. Pupils are equal, round, and reactive to light.  Neck: Normal range of motion. Neck supple. No thyromegaly present.  Cardiovascular: Normal rate, regular rhythm, normal heart sounds and intact distal pulses.   Pulmonary/Chest: Effort normal and breath sounds normal. She has no wheezes.  Abdominal: Soft. Bowel sounds are normal. She exhibits no mass. There is no tenderness.    Active bowel sounds No tenderness  Surgical wound appears clean without drainage  Musculoskeletal: Normal range of motion. She exhibits no edema.  Peripheral edema has resolved  Lymphadenopathy:    She has no cervical adenopathy.  Neurological: She is alert and oriented to person, place, and time.  Skin: Skin is warm and dry. No rash noted.  Psychiatric: She has a normal mood and affect. Her behavior is normal.          Assessment & Plan:   Diarrhea.  Patient has had recent antibiotic use.  Will check stool for C. difficile toxin.  Due to the high likelihood will start treatment with metronidazole today.  Will check CBC and electrolytes Ducolax, polyethylene glycol and stool softeners will all be discontinued. Essential hypertension.  Hospital course complicated by accelerated hypertension.  Blood pressure is now in a low-normal range with volume depletion.  We'll discontinue lisinopril and decrease dose of metoprolol and also discontinue isosorbide. History of possible pneumonia.  Patient received azithromycin and Rocephin in the ED.  Will review a chest x-ray to determine need for further antibiotic therapy.  No clinical evidence of pneumonia at this time Nausea.  Will continue aggressive treatment with anti-emetics  We'll place on a low residue diet  Cardiology and pulmonary follow-up General surgery follow-up  Recheck here next week or as needed

## 2014-11-11 NOTE — Telephone Encounter (Signed)
Patient has appointment this afternoon at 12:45 with Dr. Raliegh Ip

## 2014-11-11 NOTE — Telephone Encounter (Signed)
Pt has been sch for 10/14 at 12:30 pm

## 2014-11-11 NOTE — Telephone Encounter (Signed)
Okay to work in per Dr. Raliegh Ip

## 2014-11-11 NOTE — Patient Instructions (Signed)
Drink as much fluid as you  can tolerate over the next few days Food Choices to Help Relieve Diarrhea, Adult When you have diarrhea, the foods you eat and your eating habits are very important. Choosing the right foods and drinks can help relieve diarrhea. Also, because diarrhea can last up to 7 days, you need to replace lost fluids and electrolytes (such as sodium, potassium, and chloride) in order to help prevent dehydration.  WHAT GENERAL GUIDELINES DO I NEED TO FOLLOW?  Slowly drink 1 cup (8 oz) of fluid for each episode of diarrhea. If you are getting enough fluid, your urine will be clear or pale yellow.  Eat starchy foods. Some good choices include white rice, white toast, pasta, low-fiber cereal, baked potatoes (without the skin), saltine crackers, and bagels.  Avoid large servings of any cooked vegetables.  Limit fruit to two servings per day. A serving is  cup or 1 small piece.  Choose foods with less than 2 g of fiber per serving.  Limit fats to less than 8 tsp (38 g) per day.  Avoid fried foods.  Eat foods that have probiotics in them. Probiotics can be found in certain dairy products.  Avoid foods and beverages that may increase the speed at which food moves through the stomach and intestines (gastrointestinal tract). Things to avoid include:  High-fiber foods, such as dried fruit, raw fruits and vegetables, nuts, seeds, and whole grain foods.  Spicy foods and high-fat foods.  Foods and beverages sweetened with high-fructose corn syrup, honey, or sugar alcohols such as xylitol, sorbitol, and mannitol. WHAT FOODS ARE RECOMMENDED? Grains White rice. White, [place], or pita breads (fresh or toasted), including plain rolls, buns, or bagels. White pasta. Saltine, soda, or graham crackers. Pretzels. Low-fiber cereal. Cooked cereals made with water (such as cornmeal, farina, or cream cereals). Plain muffins. Matzo. Melba toast. Zwieback.  Vegetables Potatoes (without the skin).  Strained tomato and vegetable juices. Most well-cooked and canned vegetables without seeds. Tender lettuce. Fruits Cooked or canned applesauce, apricots, cherries, fruit cocktail, grapefruit, peaches, pears, or plums. Fresh bananas, apples without skin, cherries, grapes, cantaloupe, grapefruit, peaches, oranges, or plums.  Meat and Other Protein Products Baked or boiled chicken. Eggs. Tofu. Fish. Seafood. Smooth peanut butter. Ground or well-cooked tender beef, ham, veal, lamb, pork, or poultry.  Dairy Plain yogurt, kefir, and unsweetened liquid yogurt. Lactose-free milk, buttermilk, or soy milk. Plain hard cheese. Beverages Sport drinks. Clear broths. Diluted fruit juices (except prune). Regular, caffeine-free sodas such as ginger ale. Water. Decaffeinated teas. Oral rehydration solutions. Sugar-free beverages not sweetened with sugar alcohols. Other Bouillon, broth, or soups made from recommended foods.  The items listed above may not be a complete list of recommended foods or beverages. Contact your dietitian for more options. WHAT FOODS ARE NOT RECOMMENDED? Grains Whole grain, whole wheat, bran, or rye breads, rolls, pastas, crackers, and cereals. Wild or brown rice. Cereals that contain more than 2 g of fiber per serving. Corn tortillas or taco shells. Cooked or dry oatmeal. Granola. Popcorn. Vegetables Raw vegetables. Cabbage, broccoli, Brussels sprouts, artichokes, baked beans, beet greens, corn, kale, legumes, peas, sweet potatoes, and yams. Potato skins. Cooked spinach and cabbage. Fruits Dried fruit, including raisins and dates. Raw fruits. Stewed or dried prunes. Fresh apples with skin, apricots, mangoes, pears, raspberries, and strawberries.  Meat and Other Protein Products Chunky peanut butter. Nuts and seeds. Beans and lentils. Berniece Salines.  Dairy High-fat cheeses. Milk, chocolate milk, and beverages made with milk, such as  milk shakes. Cream. Ice cream. Sweets and Desserts Sweet  rolls, doughnuts, and sweet breads. Pancakes and waffles. Fats and Oils Butter. Cream sauces. Margarine. Salad oils. Plain salad dressings. Olives. Avocados.  Beverages Caffeinated beverages (such as coffee, tea, soda, or energy drinks). Alcoholic beverages. Fruit juices with pulp. Prune juice. Soft drinks sweetened with high-fructose corn syrup or sugar alcohols. Other Coconut. Hot sauce. Chili powder. Mayonnaise. Gravy. Cream-based or milk-based soups.  The items listed above may not be a complete list of foods and beverages to avoid. Contact your dietitian for more information. WHAT SHOULD I DO IF I BECOME DEHYDRATED? Diarrhea can sometimes lead to dehydration. Signs of dehydration include dark urine and dry mouth and skin. If you think you are dehydrated, you should rehydrate with an oral rehydration solution. These solutions can be purchased at pharmacies, retail stores, or online.  Drink -1 cup (120-240 mL) of oral rehydration solution each time you have an episode of diarrhea. If drinking this amount makes your diarrhea worse, try drinking smaller amounts more often. For example, drink 1-3 tsp (5-15 mL) every 5-10 minutes.  A general rule for staying hydrated is to drink 1-2 L of fluid per day. Talk to your health care provider about the specific amount you should be drinking each day. Drink enough fluids to keep your urine clear or pale yellow.   This information is not intended to replace advice given to you by your health care provider. Make sure you discuss any questions you have with your health care provider.   Document Released: 04/06/2003 Document Revised: 02/04/2014 Document Reviewed: 12/07/2012 Elsevier Interactive Patient Education [employer].

## 2014-11-12 LAB — CLOSTRIDIUM DIFFICILE BY PCR: Toxigenic C. Difficile by PCR: NOT DETECTED

## 2014-11-14 ENCOUNTER — Telehealth: Payer: Self-pay | Admitting: Internal Medicine

## 2014-11-14 DIAGNOSIS — Z4682 Encounter for fitting and adjustment of non-vascular catheter: Secondary | ICD-10-CM | POA: Diagnosis not present

## 2014-11-14 DIAGNOSIS — Z48815 Encounter for surgical aftercare following surgery on the digestive system: Secondary | ICD-10-CM | POA: Diagnosis not present

## 2014-11-14 DIAGNOSIS — I214 Non-ST elevation (NSTEMI) myocardial infarction: Secondary | ICD-10-CM | POA: Diagnosis not present

## 2014-11-14 DIAGNOSIS — Z6841 Body Mass Index (BMI) 40.0 and over, adult: Secondary | ICD-10-CM | POA: Diagnosis not present

## 2014-11-14 DIAGNOSIS — I10 Essential (primary) hypertension: Secondary | ICD-10-CM | POA: Diagnosis not present

## 2014-11-14 NOTE — Telephone Encounter (Signed)
Pt has not progressed as expected due to health issues. Pt was to be dc'd this week,  However, pt is no where ready. Requesting another 4 weeks X2

## 2014-11-15 NOTE — Telephone Encounter (Signed)
Okay to continue PT for pt?

## 2014-11-15 NOTE — Telephone Encounter (Signed)
Left detailed message on Stacey's voicemail, verbal orders okay to continue PT 2 x a week for 4 weeks for pt per Dr.K. Any questions please call office.

## 2014-11-15 NOTE — Telephone Encounter (Signed)
Yes, continue PT

## 2014-11-16 DIAGNOSIS — R112 Nausea with vomiting, unspecified: Secondary | ICD-10-CM | POA: Diagnosis not present

## 2014-11-16 DIAGNOSIS — I129 Hypertensive chronic kidney disease with stage 1 through stage 4 chronic kidney disease, or unspecified chronic kidney disease: Secondary | ICD-10-CM | POA: Diagnosis not present

## 2014-11-16 DIAGNOSIS — N182 Chronic kidney disease, stage 2 (mild): Secondary | ICD-10-CM | POA: Diagnosis not present

## 2014-11-16 DIAGNOSIS — F3341 Major depressive disorder, recurrent, in partial remission: Secondary | ICD-10-CM | POA: Diagnosis not present

## 2014-11-17 ENCOUNTER — Telehealth: Payer: Self-pay

## 2014-11-17 ENCOUNTER — Ambulatory Visit (HOSPITAL_COMMUNITY): Payer: Medicare Other | Attending: Cardiology

## 2014-11-17 ENCOUNTER — Encounter: Payer: Self-pay | Admitting: Interventional Cardiology

## 2014-11-17 ENCOUNTER — Ambulatory Visit (INDEPENDENT_AMBULATORY_CARE_PROVIDER_SITE_OTHER): Payer: Medicare Other | Admitting: Interventional Cardiology

## 2014-11-17 ENCOUNTER — Other Ambulatory Visit: Payer: Self-pay

## 2014-11-17 VITALS — BP 122/82 | HR 107 | Ht <= 58 in | Wt 153.0 lb

## 2014-11-17 DIAGNOSIS — I10 Essential (primary) hypertension: Secondary | ICD-10-CM | POA: Diagnosis not present

## 2014-11-17 DIAGNOSIS — I429 Cardiomyopathy, unspecified: Secondary | ICD-10-CM | POA: Diagnosis not present

## 2014-11-17 DIAGNOSIS — I5181 Takotsubo syndrome: Secondary | ICD-10-CM | POA: Diagnosis not present

## 2014-11-17 DIAGNOSIS — R0602 Shortness of breath: Secondary | ICD-10-CM

## 2014-11-17 DIAGNOSIS — I428 Other cardiomyopathies: Secondary | ICD-10-CM

## 2014-11-17 DIAGNOSIS — Z8249 Family history of ischemic heart disease and other diseases of the circulatory system: Secondary | ICD-10-CM | POA: Insufficient documentation

## 2014-11-17 DIAGNOSIS — F17200 Nicotine dependence, unspecified, uncomplicated: Secondary | ICD-10-CM | POA: Insufficient documentation

## 2014-11-17 NOTE — Progress Notes (Signed)
Patient ID: Sally Douglas, female   DOB: [birth year], 69 y.o.   MRN: [id number]      Cardiology Office Note   Date:  11/17/2014   ID:  Sally Douglas, DOB 09-19-1945, MRN [id number]  PCP:  Thressa Sheller, MD    Chief Complaint  Patient presents with  . Follow-up    echo     Wt Readings from Last 3 Encounters:  11/17/14 153 lb (69.4 kg)  11/11/14 149 lb 8 oz (67.813 kg)  10/26/14 170 lb (77.111 kg)       History of Present Illness: Sally Douglas is a 69 y.o. female  Who had perforated bowel and emergency surgery in 9/16.  At that time, she had ECG changes which prompted an echocardiogram. There is apical hypokinesis. There is concern for LAD ischemia versus Takatsubo cardiomyopathy. She was quite ill while in the hospital. We elected not to perform cardiac catheterization. She was started on medical therapy for heart failure including ACE inhibitor, beta blocker, hydralazine, nitrates and furosemide.  Since leaving the hospital, she has had significant nausea and weight loss. She has had a difficult time recovering. Family member who is with her reports that he feels she was on too much medicine. He has changed her primary care physician. She is now no longer taking many of the medications she was on. Her only cardiac medicine his metoprolol. She denies any fluid overload. She has not had swelling in her legs. She denies any shortness of breath when lying flat. She denies any chest discomfort. Echocardiogram was done earlier today but the results are pending.    Past Medical History  Diagnosis Date  . Insomnia     takes Ambien nightly  . Obesity   . Menopausal syndrome   . Tobacco abuse   . Low back pain   . Bronchitis     hx of-many yrs ago  . Abnormal finding on Pap smear     hx abnl pap  . Hypertension     takes Diltiazem and HCTZ daily  . Constipation     takes Miralax daily  . Arthritis   . Joint pain   . Joint swelling     Past Surgical History  Procedure  Laterality Date  . Tonsillectomy    . Dilation and curettage of uterus    . Ventral hernia repair  01/24/2011    Procedure: LAPAROSCOPIC VENTRAL HERNIA;  Surgeon: Adin Hector, MD;  Location: [facility];  Service: General;  Laterality: N/A;  with Mesh  . Maximum access (mas)posterior lumbar interbody fusion (plif) 1 level N/A 11/04/2012    Procedure: Lumbar four-five Maximum access posterior lumbar interbody fusion with Nuvasive;  Surgeon: Eustace Moore, MD;  Location: [place] [facility];  Service: Neurosurgery;  Laterality: N/A;  Lumbar four-five Maximum access posterior lumbar interbody fusion with Nuvasive  . Laparotomy N/A 10/16/2014    Procedure: EXPLORATORY LAPAROTOMY, REPAIR OF GASTRIC ULCER;  Surgeon: Georganna Skeans, MD;  Location: [place];  Service: General;  Laterality: N/A;     Current Outpatient Prescriptions  Medication Sig Dispense Refill  . feeding supplement, ENSURE ENLIVE, (ENSURE ENLIVE) LIQD Take 237 mLs by mouth 2 (two) times daily between meals. 237 mL 12  . HYDROcodone-acetaminophen (NORCO) 10-325 MG per tablet Take 0.5-2 tablets by mouth every 6 (six) hours as needed for moderate pain or severe pain (Prescribed by Dr. Maryjean Ka). 40 tablet 0  . metoprolol (LOPRESSOR) 50 MG tablet Take 0.5 tablets (25 mg total) by  mouth 2 (two) times daily. 60 tablet 0  . pantoprazole (PROTONIX) 40 MG tablet Take 1 tablet (40 mg total) by mouth 2 (two) times daily before a meal. 60 tablet 0  . VOLTAREN 1 % GEL Apply 1 application topically daily as needed. FOR HANDS  2   No current facility-administered medications for this visit.    Allergies:   K-dur and Nsaids    Social History:  The patient  reports that she has been smoking Cigarettes.  She has a 6.25 pack-year smoking history. She has never used smokeless tobacco. She reports that she does not drink alcohol or use illicit drugs.   Family History:  The patient's family history includes Breast cancer in an other family member; COPD in her  brother; Cancer in her brother and mother; Diabetes in her brother, brother, mother, and sister; Glaucoma in her mother; Heart disease in her sister; Hypertension in her brother, mother, and sister; Kidney disease in her brother and another family member.    ROS:  Please see the history of present illness.   Otherwise, review of systems are positive for lack of appetite.   All other systems are reviewed and negative.    PHYSICAL EXAM: VS:  BP 122/82 mmHg  Pulse 107  Ht 4\' 8"  (1.422 m)  Wt 153 lb (69.4 kg)  BMI 34.32 kg/m2 , BMI Body mass index is 34.32 kg/(m^2). GEN: Well nourished, well developed, in no acute distress, Appears weak HEENT: normal Neck: no JVD, carotid bruits, or masses Cardiac: RRR; no murmurs, rubs, or gallops,no edema  Respiratory:  clear to auscultation bilaterally, normal work of breathing GI: soft, nontender, nondistended, + BS, abdominal dressing in place MS: no deformity or atrophy Skin: warm and dry, no rash Neuro:  Strength and sensation are intact Psych: euthymic mood, full affect      Recent Labs: 11/29/2013: TSH 0.59 10/18/2014: Magnesium 1.8 11/11/2014: ALT 24; BUN 22; Creatinine, Ser 1.15; Hemoglobin 11.4*; Platelets 287.0; Potassium 4.8; Sodium 138   Lipid Panel    Component Value Date/Time   CHOL 158 10/21/2014 0611   TRIG 175* 10/21/2014 0611   HDL 36* 10/21/2014 0611   CHOLHDL 4.4 10/21/2014 0611   VLDL 35 10/21/2014 0611   LDLCALC 87 10/21/2014 0611     Other studies Reviewed: Additional studies/ records that were reviewed today with results demonstrating: Echo today shows normal left ventricular function.   ASSESSMENT AND PLAN:  1. Takatsubo (nonischemic) cardiomyopathy:  Echo report is now back. LV function has returned to normal. Continue beta blocker since she seems to be tolerating this.  No further cardiac workup planned at this time. Her primary issues will need to be getting better from her abdominal surgery in getting her  strength back. She is not having any anginal symptoms. No symptoms of heart failure. 2. GI: Nausea, lack of appetite to be managed by her primary care physician and surgeon. This is not related to any cardiac cause as her heart function has returned to normal. 3. I again explained the rationale of our treatment decisions in the hospital and here, , specifically avoiding heart catheterization, with the family. She was certainly too frail in the hospital to undergo heart cath. She still appears weak. Given normal LV function, I think she can be managed conservatively.   Current medicines are reviewed at length with the patient today.  The patient concerns regarding her medicines were addressed.  The following changes have been made:  No change  Labs/ tests  ordered today include:  No orders of the defined types were placed in this encounter.    Recommend 150 minutes/week of aerobic exercise Low fat, low carb, high fiber diet recommended  Disposition:   FU prn   Teresita Madura., MD  11/17/2014 10:56 AM    [facility] [address], [place], [place]  [address] Phone: [phone]; Fax: [phone]

## 2014-11-17 NOTE — Telephone Encounter (Signed)
**Note De-Identified Sally Douglas Obfuscation** The pts husband, [name], is advised, per Dr Irish Lack, that the pts Echo results are ok and that she can f/u with Dr Irish Lack as needed. He verbalized understanding and thanked me for calling with results.

## 2014-11-17 NOTE — Patient Instructions (Addendum)
Medication Instructions:  Same-no changes  Labwork: None  Testing/Procedures: None   Follow-Up: depending on Echo results.

## 2014-11-18 DIAGNOSIS — Z48815 Encounter for surgical aftercare following surgery on the digestive system: Secondary | ICD-10-CM | POA: Diagnosis not present

## 2014-11-18 DIAGNOSIS — Z6841 Body Mass Index (BMI) 40.0 and over, adult: Secondary | ICD-10-CM | POA: Diagnosis not present

## 2014-11-18 DIAGNOSIS — I214 Non-ST elevation (NSTEMI) myocardial infarction: Secondary | ICD-10-CM | POA: Diagnosis not present

## 2014-11-18 DIAGNOSIS — I10 Essential (primary) hypertension: Secondary | ICD-10-CM | POA: Diagnosis not present

## 2014-11-18 DIAGNOSIS — Z4682 Encounter for fitting and adjustment of non-vascular catheter: Secondary | ICD-10-CM | POA: Diagnosis not present

## 2014-11-21 DIAGNOSIS — Z4682 Encounter for fitting and adjustment of non-vascular catheter: Secondary | ICD-10-CM | POA: Diagnosis not present

## 2014-11-21 DIAGNOSIS — Z6841 Body Mass Index (BMI) 40.0 and over, adult: Secondary | ICD-10-CM | POA: Diagnosis not present

## 2014-11-21 DIAGNOSIS — I214 Non-ST elevation (NSTEMI) myocardial infarction: Secondary | ICD-10-CM | POA: Diagnosis not present

## 2014-11-21 DIAGNOSIS — Z48815 Encounter for surgical aftercare following surgery on the digestive system: Secondary | ICD-10-CM | POA: Diagnosis not present

## 2014-11-21 DIAGNOSIS — I10 Essential (primary) hypertension: Secondary | ICD-10-CM | POA: Diagnosis not present

## 2014-11-23 DIAGNOSIS — I214 Non-ST elevation (NSTEMI) myocardial infarction: Secondary | ICD-10-CM | POA: Diagnosis not present

## 2014-11-23 DIAGNOSIS — I10 Essential (primary) hypertension: Secondary | ICD-10-CM | POA: Diagnosis not present

## 2014-11-23 DIAGNOSIS — Z6841 Body Mass Index (BMI) 40.0 and over, adult: Secondary | ICD-10-CM | POA: Diagnosis not present

## 2014-11-23 DIAGNOSIS — Z4682 Encounter for fitting and adjustment of non-vascular catheter: Secondary | ICD-10-CM | POA: Diagnosis not present

## 2014-11-23 DIAGNOSIS — Z48815 Encounter for surgical aftercare following surgery on the digestive system: Secondary | ICD-10-CM | POA: Diagnosis not present

## 2014-11-25 DIAGNOSIS — I214 Non-ST elevation (NSTEMI) myocardial infarction: Secondary | ICD-10-CM | POA: Diagnosis not present

## 2014-11-25 DIAGNOSIS — I10 Essential (primary) hypertension: Secondary | ICD-10-CM | POA: Diagnosis not present

## 2014-11-25 DIAGNOSIS — Z48815 Encounter for surgical aftercare following surgery on the digestive system: Secondary | ICD-10-CM | POA: Diagnosis not present

## 2014-11-25 DIAGNOSIS — Z6841 Body Mass Index (BMI) 40.0 and over, adult: Secondary | ICD-10-CM | POA: Diagnosis not present

## 2014-11-25 DIAGNOSIS — Z4682 Encounter for fitting and adjustment of non-vascular catheter: Secondary | ICD-10-CM | POA: Diagnosis not present

## 2014-11-28 DIAGNOSIS — Z4682 Encounter for fitting and adjustment of non-vascular catheter: Secondary | ICD-10-CM | POA: Diagnosis not present

## 2014-11-28 DIAGNOSIS — Z48815 Encounter for surgical aftercare following surgery on the digestive system: Secondary | ICD-10-CM | POA: Diagnosis not present

## 2014-11-28 DIAGNOSIS — I214 Non-ST elevation (NSTEMI) myocardial infarction: Secondary | ICD-10-CM | POA: Diagnosis not present

## 2014-11-28 DIAGNOSIS — Z6841 Body Mass Index (BMI) 40.0 and over, adult: Secondary | ICD-10-CM | POA: Diagnosis not present

## 2014-11-28 DIAGNOSIS — I10 Essential (primary) hypertension: Secondary | ICD-10-CM | POA: Diagnosis not present

## 2014-11-29 DIAGNOSIS — R112 Nausea with vomiting, unspecified: Secondary | ICD-10-CM | POA: Diagnosis not present

## 2014-11-29 DIAGNOSIS — R399 Unspecified symptoms and signs involving the genitourinary system: Secondary | ICD-10-CM | POA: Diagnosis not present

## 2014-11-29 DIAGNOSIS — B370 Candidal stomatitis: Secondary | ICD-10-CM | POA: Diagnosis not present

## 2014-11-29 DIAGNOSIS — R197 Diarrhea, unspecified: Secondary | ICD-10-CM | POA: Diagnosis not present

## 2014-11-29 DIAGNOSIS — N390 Urinary tract infection, site not specified: Secondary | ICD-10-CM | POA: Diagnosis not present

## 2014-11-30 DIAGNOSIS — I214 Non-ST elevation (NSTEMI) myocardial infarction: Secondary | ICD-10-CM | POA: Diagnosis not present

## 2014-11-30 DIAGNOSIS — Z6841 Body Mass Index (BMI) 40.0 and over, adult: Secondary | ICD-10-CM | POA: Diagnosis not present

## 2014-11-30 DIAGNOSIS — I10 Essential (primary) hypertension: Secondary | ICD-10-CM | POA: Diagnosis not present

## 2014-11-30 DIAGNOSIS — Z4682 Encounter for fitting and adjustment of non-vascular catheter: Secondary | ICD-10-CM | POA: Diagnosis not present

## 2014-11-30 DIAGNOSIS — Z48815 Encounter for surgical aftercare following surgery on the digestive system: Secondary | ICD-10-CM | POA: Diagnosis not present

## 2014-12-02 DIAGNOSIS — I10 Essential (primary) hypertension: Secondary | ICD-10-CM | POA: Diagnosis not present

## 2014-12-02 DIAGNOSIS — Z48815 Encounter for surgical aftercare following surgery on the digestive system: Secondary | ICD-10-CM | POA: Diagnosis not present

## 2014-12-02 DIAGNOSIS — I214 Non-ST elevation (NSTEMI) myocardial infarction: Secondary | ICD-10-CM | POA: Diagnosis not present

## 2014-12-02 DIAGNOSIS — Z4682 Encounter for fitting and adjustment of non-vascular catheter: Secondary | ICD-10-CM | POA: Diagnosis not present

## 2014-12-02 DIAGNOSIS — Z6841 Body Mass Index (BMI) 40.0 and over, adult: Secondary | ICD-10-CM | POA: Diagnosis not present

## 2014-12-05 DIAGNOSIS — Z4682 Encounter for fitting and adjustment of non-vascular catheter: Secondary | ICD-10-CM | POA: Diagnosis not present

## 2014-12-05 DIAGNOSIS — Z48815 Encounter for surgical aftercare following surgery on the digestive system: Secondary | ICD-10-CM | POA: Diagnosis not present

## 2014-12-05 DIAGNOSIS — I214 Non-ST elevation (NSTEMI) myocardial infarction: Secondary | ICD-10-CM | POA: Diagnosis not present

## 2014-12-05 DIAGNOSIS — I10 Essential (primary) hypertension: Secondary | ICD-10-CM | POA: Diagnosis not present

## 2014-12-05 DIAGNOSIS — Z6841 Body Mass Index (BMI) 40.0 and over, adult: Secondary | ICD-10-CM | POA: Diagnosis not present

## 2014-12-06 DIAGNOSIS — I10 Essential (primary) hypertension: Secondary | ICD-10-CM | POA: Diagnosis not present

## 2014-12-06 DIAGNOSIS — B370 Candidal stomatitis: Secondary | ICD-10-CM | POA: Diagnosis not present

## 2014-12-06 DIAGNOSIS — R630 Anorexia: Secondary | ICD-10-CM | POA: Diagnosis not present

## 2014-12-06 DIAGNOSIS — N390 Urinary tract infection, site not specified: Secondary | ICD-10-CM | POA: Diagnosis not present

## 2014-12-06 DIAGNOSIS — R399 Unspecified symptoms and signs involving the genitourinary system: Secondary | ICD-10-CM | POA: Diagnosis not present

## 2014-12-07 ENCOUNTER — Encounter: Payer: No Typology Code available for payment source | Admitting: Internal Medicine

## 2014-12-07 DIAGNOSIS — Z48815 Encounter for surgical aftercare following surgery on the digestive system: Secondary | ICD-10-CM | POA: Diagnosis not present

## 2014-12-07 DIAGNOSIS — Z6841 Body Mass Index (BMI) 40.0 and over, adult: Secondary | ICD-10-CM | POA: Diagnosis not present

## 2014-12-07 DIAGNOSIS — I10 Essential (primary) hypertension: Secondary | ICD-10-CM | POA: Diagnosis not present

## 2014-12-07 DIAGNOSIS — Z4682 Encounter for fitting and adjustment of non-vascular catheter: Secondary | ICD-10-CM | POA: Diagnosis not present

## 2014-12-07 DIAGNOSIS — I214 Non-ST elevation (NSTEMI) myocardial infarction: Secondary | ICD-10-CM | POA: Diagnosis not present

## 2014-12-09 DIAGNOSIS — I10 Essential (primary) hypertension: Secondary | ICD-10-CM | POA: Diagnosis not present

## 2014-12-09 DIAGNOSIS — Z6841 Body Mass Index (BMI) 40.0 and over, adult: Secondary | ICD-10-CM | POA: Diagnosis not present

## 2014-12-09 DIAGNOSIS — I214 Non-ST elevation (NSTEMI) myocardial infarction: Secondary | ICD-10-CM | POA: Diagnosis not present

## 2014-12-09 DIAGNOSIS — Z4682 Encounter for fitting and adjustment of non-vascular catheter: Secondary | ICD-10-CM | POA: Diagnosis not present

## 2014-12-09 DIAGNOSIS — Z48815 Encounter for surgical aftercare following surgery on the digestive system: Secondary | ICD-10-CM | POA: Diagnosis not present

## 2014-12-12 DIAGNOSIS — Z48815 Encounter for surgical aftercare following surgery on the digestive system: Secondary | ICD-10-CM | POA: Diagnosis not present

## 2014-12-12 DIAGNOSIS — Z6841 Body Mass Index (BMI) 40.0 and over, adult: Secondary | ICD-10-CM | POA: Diagnosis not present

## 2014-12-12 DIAGNOSIS — I10 Essential (primary) hypertension: Secondary | ICD-10-CM | POA: Diagnosis not present

## 2014-12-12 DIAGNOSIS — I214 Non-ST elevation (NSTEMI) myocardial infarction: Secondary | ICD-10-CM | POA: Diagnosis not present

## 2014-12-12 DIAGNOSIS — Z4682 Encounter for fitting and adjustment of non-vascular catheter: Secondary | ICD-10-CM | POA: Diagnosis not present

## 2014-12-13 DIAGNOSIS — B370 Candidal stomatitis: Secondary | ICD-10-CM | POA: Diagnosis not present

## 2014-12-13 DIAGNOSIS — R399 Unspecified symptoms and signs involving the genitourinary system: Secondary | ICD-10-CM | POA: Diagnosis not present

## 2014-12-13 DIAGNOSIS — R630 Anorexia: Secondary | ICD-10-CM | POA: Diagnosis not present

## 2014-12-13 DIAGNOSIS — I10 Essential (primary) hypertension: Secondary | ICD-10-CM | POA: Diagnosis not present

## 2014-12-14 DIAGNOSIS — I214 Non-ST elevation (NSTEMI) myocardial infarction: Secondary | ICD-10-CM | POA: Diagnosis not present

## 2014-12-14 DIAGNOSIS — Z6841 Body Mass Index (BMI) 40.0 and over, adult: Secondary | ICD-10-CM | POA: Diagnosis not present

## 2014-12-14 DIAGNOSIS — Z4682 Encounter for fitting and adjustment of non-vascular catheter: Secondary | ICD-10-CM | POA: Diagnosis not present

## 2014-12-14 DIAGNOSIS — I10 Essential (primary) hypertension: Secondary | ICD-10-CM | POA: Diagnosis not present

## 2014-12-14 DIAGNOSIS — Z48815 Encounter for surgical aftercare following surgery on the digestive system: Secondary | ICD-10-CM | POA: Diagnosis not present

## 2014-12-16 DIAGNOSIS — Z4682 Encounter for fitting and adjustment of non-vascular catheter: Secondary | ICD-10-CM | POA: Diagnosis not present

## 2014-12-16 DIAGNOSIS — Z6841 Body Mass Index (BMI) 40.0 and over, adult: Secondary | ICD-10-CM | POA: Diagnosis not present

## 2014-12-16 DIAGNOSIS — I214 Non-ST elevation (NSTEMI) myocardial infarction: Secondary | ICD-10-CM | POA: Diagnosis not present

## 2014-12-16 DIAGNOSIS — I10 Essential (primary) hypertension: Secondary | ICD-10-CM | POA: Diagnosis not present

## 2014-12-16 DIAGNOSIS — Z48815 Encounter for surgical aftercare following surgery on the digestive system: Secondary | ICD-10-CM | POA: Diagnosis not present

## 2014-12-19 ENCOUNTER — Telehealth: Payer: Self-pay | Admitting: Internal Medicine

## 2014-12-19 NOTE — Telephone Encounter (Signed)
Sally Douglas would like to decrease visits to twice a wk for 1 wk and then once a wk for 4 wks. Verbal order is ok

## 2014-12-20 DIAGNOSIS — Z6841 Body Mass Index (BMI) 40.0 and over, adult: Secondary | ICD-10-CM | POA: Diagnosis not present

## 2014-12-20 DIAGNOSIS — Z48815 Encounter for surgical aftercare following surgery on the digestive system: Secondary | ICD-10-CM | POA: Diagnosis not present

## 2014-12-20 DIAGNOSIS — I10 Essential (primary) hypertension: Secondary | ICD-10-CM | POA: Diagnosis not present

## 2014-12-20 DIAGNOSIS — I214 Non-ST elevation (NSTEMI) myocardial infarction: Secondary | ICD-10-CM | POA: Diagnosis not present

## 2014-12-20 DIAGNOSIS — Z4682 Encounter for fitting and adjustment of non-vascular catheter: Secondary | ICD-10-CM | POA: Diagnosis not present

## 2014-12-20 NOTE — Telephone Encounter (Signed)
Left detailed message for Sally Douglas with [facility] on personal voicemail. Verbal order to decrease visits to twice a wk for 1 wk and then once a wk for 4 wks for pt, okay per Dr.K. Any questions please call the office.

## 2014-12-23 DIAGNOSIS — I10 Essential (primary) hypertension: Secondary | ICD-10-CM | POA: Diagnosis not present

## 2014-12-23 DIAGNOSIS — Z48815 Encounter for surgical aftercare following surgery on the digestive system: Secondary | ICD-10-CM | POA: Diagnosis not present

## 2014-12-23 DIAGNOSIS — Z6841 Body Mass Index (BMI) 40.0 and over, adult: Secondary | ICD-10-CM | POA: Diagnosis not present

## 2014-12-23 DIAGNOSIS — I252 Old myocardial infarction: Secondary | ICD-10-CM | POA: Diagnosis not present

## 2014-12-23 DIAGNOSIS — Z720 Tobacco use: Secondary | ICD-10-CM | POA: Diagnosis not present

## 2014-12-28 ENCOUNTER — Ambulatory Visit: Payer: No Typology Code available for payment source | Admitting: Internal Medicine

## 2014-12-28 ENCOUNTER — Encounter: Payer: No Typology Code available for payment source | Admitting: Internal Medicine

## 2014-12-28 DIAGNOSIS — I10 Essential (primary) hypertension: Secondary | ICD-10-CM | POA: Diagnosis not present

## 2014-12-28 DIAGNOSIS — Z720 Tobacco use: Secondary | ICD-10-CM | POA: Diagnosis not present

## 2014-12-28 DIAGNOSIS — Z48815 Encounter for surgical aftercare following surgery on the digestive system: Secondary | ICD-10-CM | POA: Diagnosis not present

## 2014-12-28 DIAGNOSIS — I252 Old myocardial infarction: Secondary | ICD-10-CM | POA: Diagnosis not present

## 2014-12-28 DIAGNOSIS — Z6841 Body Mass Index (BMI) 40.0 and over, adult: Secondary | ICD-10-CM | POA: Diagnosis not present

## 2015-01-04 DIAGNOSIS — Z6841 Body Mass Index (BMI) 40.0 and over, adult: Secondary | ICD-10-CM | POA: Diagnosis not present

## 2015-01-04 DIAGNOSIS — Z48815 Encounter for surgical aftercare following surgery on the digestive system: Secondary | ICD-10-CM | POA: Diagnosis not present

## 2015-01-04 DIAGNOSIS — I252 Old myocardial infarction: Secondary | ICD-10-CM | POA: Diagnosis not present

## 2015-01-04 DIAGNOSIS — I10 Essential (primary) hypertension: Secondary | ICD-10-CM | POA: Diagnosis not present

## 2015-01-04 DIAGNOSIS — Z720 Tobacco use: Secondary | ICD-10-CM | POA: Diagnosis not present

## 2015-01-10 DIAGNOSIS — R110 Nausea: Secondary | ICD-10-CM | POA: Diagnosis not present

## 2015-01-10 DIAGNOSIS — R630 Anorexia: Secondary | ICD-10-CM | POA: Diagnosis not present

## 2015-01-10 DIAGNOSIS — B370 Candidal stomatitis: Secondary | ICD-10-CM | POA: Diagnosis not present

## 2015-01-10 DIAGNOSIS — I10 Essential (primary) hypertension: Secondary | ICD-10-CM | POA: Diagnosis not present

## 2015-01-11 DIAGNOSIS — I252 Old myocardial infarction: Secondary | ICD-10-CM | POA: Diagnosis not present

## 2015-01-11 DIAGNOSIS — I10 Essential (primary) hypertension: Secondary | ICD-10-CM | POA: Diagnosis not present

## 2015-01-11 DIAGNOSIS — Z6841 Body Mass Index (BMI) 40.0 and over, adult: Secondary | ICD-10-CM | POA: Diagnosis not present

## 2015-01-11 DIAGNOSIS — Z48815 Encounter for surgical aftercare following surgery on the digestive system: Secondary | ICD-10-CM | POA: Diagnosis not present

## 2015-01-11 DIAGNOSIS — Z720 Tobacco use: Secondary | ICD-10-CM | POA: Diagnosis not present

## 2015-01-20 DIAGNOSIS — Z48815 Encounter for surgical aftercare following surgery on the digestive system: Secondary | ICD-10-CM | POA: Diagnosis not present

## 2015-01-20 DIAGNOSIS — I10 Essential (primary) hypertension: Secondary | ICD-10-CM | POA: Diagnosis not present

## 2015-01-20 DIAGNOSIS — Z6841 Body Mass Index (BMI) 40.0 and over, adult: Secondary | ICD-10-CM | POA: Diagnosis not present

## 2015-01-20 DIAGNOSIS — Z720 Tobacco use: Secondary | ICD-10-CM | POA: Diagnosis not present

## 2015-01-20 DIAGNOSIS — I252 Old myocardial infarction: Secondary | ICD-10-CM | POA: Diagnosis not present

## 2015-01-24 DIAGNOSIS — J342 Deviated nasal septum: Secondary | ICD-10-CM | POA: Diagnosis not present

## 2015-01-24 DIAGNOSIS — R439 Unspecified disturbances of smell and taste: Secondary | ICD-10-CM | POA: Diagnosis not present

## 2015-01-25 ENCOUNTER — Other Ambulatory Visit: Payer: Self-pay | Admitting: Gastroenterology

## 2015-01-25 ENCOUNTER — Encounter: Payer: Self-pay | Admitting: *Deleted

## 2015-01-25 DIAGNOSIS — R112 Nausea with vomiting, unspecified: Secondary | ICD-10-CM | POA: Diagnosis not present

## 2015-01-25 DIAGNOSIS — J984 Other disorders of lung: Secondary | ICD-10-CM | POA: Diagnosis not present

## 2015-01-25 DIAGNOSIS — R634 Abnormal weight loss: Secondary | ICD-10-CM | POA: Diagnosis not present

## 2015-01-25 DIAGNOSIS — K219 Gastro-esophageal reflux disease without esophagitis: Secondary | ICD-10-CM | POA: Diagnosis not present

## 2015-01-25 DIAGNOSIS — R110 Nausea: Secondary | ICD-10-CM

## 2015-02-02 DIAGNOSIS — Z79899 Other long term (current) drug therapy: Secondary | ICD-10-CM | POA: Diagnosis not present

## 2015-02-02 DIAGNOSIS — M545 Low back pain: Secondary | ICD-10-CM | POA: Diagnosis not present

## 2015-02-02 DIAGNOSIS — Z5181 Encounter for therapeutic drug level monitoring: Secondary | ICD-10-CM | POA: Diagnosis not present

## 2015-02-02 DIAGNOSIS — Z79891 Long term (current) use of opiate analgesic: Secondary | ICD-10-CM | POA: Diagnosis not present

## 2015-02-02 DIAGNOSIS — M4806 Spinal stenosis, lumbar region: Secondary | ICD-10-CM | POA: Diagnosis not present

## 2015-02-17 ENCOUNTER — Ambulatory Visit (HOSPITAL_COMMUNITY)
Admission: RE | Admit: 2015-02-17 | Discharge: 2015-02-17 | Disposition: A | Discharge status: Home / Self Care | Payer: Medicare Other | Source: Ambulatory Visit | Attending: Gastroenterology | Admitting: Gastroenterology

## 2015-02-17 DIAGNOSIS — R634 Abnormal weight loss: Secondary | ICD-10-CM | POA: Diagnosis not present

## 2015-02-17 DIAGNOSIS — R112 Nausea with vomiting, unspecified: Secondary | ICD-10-CM | POA: Insufficient documentation

## 2015-02-17 DIAGNOSIS — R110 Nausea: Secondary | ICD-10-CM

## 2015-02-17 MED ORDER — TECHNETIUM TC 99M SULFUR COLLOID
2.0000 | Freq: Once | INTRAVENOUS | Status: AC | PRN
  Administered 2015-02-17: 2 via INTRAVENOUS

## 2015-02-20 DIAGNOSIS — L0390 Cellulitis, unspecified: Secondary | ICD-10-CM | POA: Diagnosis not present

## 2015-02-22 DIAGNOSIS — J984 Other disorders of lung: Secondary | ICD-10-CM | POA: Diagnosis not present

## 2015-02-22 DIAGNOSIS — R634 Abnormal weight loss: Secondary | ICD-10-CM | POA: Diagnosis not present

## 2015-02-22 DIAGNOSIS — K219 Gastro-esophageal reflux disease without esophagitis: Secondary | ICD-10-CM | POA: Diagnosis not present

## 2015-02-22 DIAGNOSIS — R112 Nausea with vomiting, unspecified: Secondary | ICD-10-CM | POA: Diagnosis not present

## 2015-02-27 ENCOUNTER — Telehealth: Payer: Self-pay | Admitting: Interventional Cardiology

## 2015-02-27 NOTE — Telephone Encounter (Signed)
I left a detailed message on Sally Douglas's VM stating that I am unaware of a referral for pulmonology per Dr Irish Lack. At her last OV with Dr Irish Lack on 11/17/14 there was no mention of Pulmonology referral and she was advised to f/u as needed.  I left this office's phone number on her VM so she can call back if she has any questions.

## 2015-02-27 NOTE — Telephone Encounter (Signed)
New message  Request a call back to discuss who the pt was referred to for pulmonology. Please call back to discuss. When you call back please ask to speak with her directly.

## 2015-02-28 DIAGNOSIS — I509 Heart failure, unspecified: Secondary | ICD-10-CM | POA: Diagnosis not present

## 2015-02-28 DIAGNOSIS — R600 Localized edema: Secondary | ICD-10-CM | POA: Diagnosis not present

## 2015-03-13 DIAGNOSIS — F3340 Major depressive disorder, recurrent, in remission, unspecified: Secondary | ICD-10-CM | POA: Diagnosis not present

## 2015-03-16 ENCOUNTER — Encounter: Payer: Self-pay | Admitting: Internal Medicine

## 2015-03-16 ENCOUNTER — Ambulatory Visit (INDEPENDENT_AMBULATORY_CARE_PROVIDER_SITE_OTHER): Payer: Medicare Other | Admitting: Internal Medicine

## 2015-03-16 ENCOUNTER — Other Ambulatory Visit: Payer: Self-pay | Admitting: Internal Medicine

## 2015-03-16 VITALS — BP 160/80 | HR 76 | Temp 97.6°F | Resp 18 | Ht <= 58 in | Wt 138.0 lb

## 2015-03-16 DIAGNOSIS — F17200 Nicotine dependence, unspecified, uncomplicated: Secondary | ICD-10-CM

## 2015-03-16 DIAGNOSIS — R911 Solitary pulmonary nodule: Secondary | ICD-10-CM | POA: Diagnosis not present

## 2015-03-16 DIAGNOSIS — M5489 Other dorsalgia: Secondary | ICD-10-CM

## 2015-03-16 DIAGNOSIS — I10 Essential (primary) hypertension: Secondary | ICD-10-CM | POA: Diagnosis not present

## 2015-03-16 DIAGNOSIS — N289 Disorder of kidney and ureter, unspecified: Secondary | ICD-10-CM

## 2015-03-16 DIAGNOSIS — I429 Cardiomyopathy, unspecified: Secondary | ICD-10-CM | POA: Diagnosis not present

## 2015-03-16 DIAGNOSIS — I428 Other cardiomyopathies: Secondary | ICD-10-CM

## 2015-03-16 LAB — CBC WITH DIFFERENTIAL/PLATELET
Basophils Absolute: 0.1 10*3/uL (ref 0.0–0.1)
Basophils Relative: 0.5 % (ref 0.0–3.0)
EOS PCT: 2.3 % (ref 0.0–5.0)
Eosinophils Absolute: 0.3 10*3/uL (ref 0.0–0.7)
HEMATOCRIT: 37.9 % (ref 36.0–46.0)
HEMOGLOBIN: 12.3 g/dL (ref 12.0–15.0)
LYMPHS ABS: 2.3 10*3/uL (ref 0.7–4.0)
LYMPHS PCT: 20.2 % (ref 12.0–46.0)
MCHC: 32.3 g/dL (ref 30.0–36.0)
MCV: 89.8 fl (ref 78.0–100.0)
MONOS PCT: 6.5 % (ref 3.0–12.0)
Monocytes Absolute: 0.7 10*3/uL (ref 0.1–1.0)
Neutro Abs: 8 10*3/uL — ABNORMAL HIGH (ref 1.4–7.7)
Neutrophils Relative %: 70.5 % (ref 43.0–77.0)
Platelets: 364 10*3/uL (ref 150.0–400.0)
RBC: 4.22 Mil/uL (ref 3.87–5.11)
RDW: 16.1 % — ABNORMAL HIGH (ref 11.5–15.5)
WBC: 11.3 10*3/uL — ABNORMAL HIGH (ref 4.0–10.5)

## 2015-03-16 LAB — COMPREHENSIVE METABOLIC PANEL
ALK PHOS: 102 U/L (ref 39–117)
ALT: 9 U/L (ref 0–35)
AST: 12 U/L (ref 0–37)
Albumin: 3.8 g/dL (ref 3.5–5.2)
BUN: 13 mg/dL (ref 6–23)
CO2: 30 mEq/L (ref 19–32)
Calcium: 9.8 mg/dL (ref 8.4–10.5)
Chloride: 106 mEq/L (ref 96–112)
Creatinine, Ser: 1.04 mg/dL (ref 0.40–1.20)
GFR: 55.7 mL/min — AB (ref >60.00)
GLUCOSE: 137 mg/dL — AB (ref 70–99)
POTASSIUM: 4.9 meq/L (ref 3.5–5.1)
Sodium: 147 mEq/L — ABNORMAL HIGH (ref 135–145)
TOTAL PROTEIN: 6.6 g/dL (ref 6.0–8.3)
Total Bilirubin: 0.5 mg/dL (ref 0.2–1.2)

## 2015-03-16 NOTE — Progress Notes (Signed)
Pre visit review using our clinic review tool, if applicable. No additional management support is needed unless otherwise documented below in the visit note. 

## 2015-03-16 NOTE — Progress Notes (Signed)
Subjective:    Patient ID: Sally Douglas, female    DOB: [birth year], 70 y.o.   MRN: [id number]  HPI  70 year old patient who is seen today in follow-up. She was discharged from the hospital on September 25 of last year following multiple complications from a perforated gastric ulcer.  Evaluation at that time included an incidental 11 mm nodule in the right lower lung as well as a possible right renal lesion involving the inferior pole. She has been followed by cardiology and general surgery, but has not been seen in follow-up by pulmonary medicine. She was last seen by me on October 14, and multiple medications were discontinued in the setting of volume repletion and diarrhea.  The patient had been seen and treated for a possible pneumonia and had been on antibiotic therapy.  C. difficile toxin was negative.  The patient was asked return in follow-up in one week. At the encouragement of her daughter, the patient has been followed by another PCP until today.  The patient was also seen by GI who performed a recent gastric emptying study which was normal.  She was told by her gastroenterologist " you have lung cancer and all your doctors have dropped the ball". Today the patient feels generally well.  Her only real complaint is diminished taste and smell since her surgery in the fall.   Wt Readings from Last 3 Encounters:  03/16/15 138 lb (62.596 kg)  11/17/14 153 lb (69.4 kg)  11/11/14 149 lb 8 oz (67.813 kg)    BP Readings from Last 3 Encounters:  03/16/15 160/80  11/17/14 122/82  11/11/14 106/68    Past Medical History  Diagnosis Date  . Insomnia     takes Ambien nightly  . Obesity   . Menopausal syndrome   . Tobacco abuse   . Low back pain   . Bronchitis     hx of-many yrs ago  . Abnormal finding on Pap smear     hx abnl pap  . Hypertension     takes Diltiazem and HCTZ daily  . Constipation     takes Miralax daily  . Arthritis   . Joint pain   . Joint swelling      Social History   Social History  . Marital Status: Married    Spouse Name: N/A  . Number of Children: N/A  . Years of Education: N/A   Occupational History  . Not on file.   Social History Main Topics  . Smoking status: Current Every Day Smoker -- 0.25 packs/day for 25 years    Types: Cigarettes  . Smokeless tobacco: Never Used     Comment: Electronic Cigarettes  . Alcohol Use: No     Comment: occassional  . Drug Use: No  . Sexual Activity: Not Currently    Birth Control/ Protection: Post-menopausal   Other Topics Concern  . Not on file   Social History Narrative   gavidia 1   Para 1    aborta 0    Past Surgical History  Procedure Laterality Date  . Tonsillectomy    . Dilation and curettage of uterus    . Ventral hernia repair  01/24/2011    Procedure: LAPAROSCOPIC VENTRAL HERNIA;  Surgeon: Adin Hector, MD;  Location: [facility];  Service: General;  Laterality: N/A;  with Mesh  . Maximum access (mas)posterior lumbar interbody fusion (plif) 1 level N/A 11/04/2012    Procedure: Lumbar four-five Maximum access posterior lumbar interbody fusion with Nuvasive;  Surgeon: Eustace Moore, MD;  Location: [facility];  Service: Neurosurgery;  Laterality: N/A;  Lumbar four-five Maximum access posterior lumbar interbody fusion with Nuvasive  . Laparotomy N/A 10/16/2014    Procedure: EXPLORATORY LAPAROTOMY, REPAIR OF GASTRIC ULCER;  Surgeon: Georganna Skeans, MD;  Location: [facility];  Service: General;  Laterality: N/A;    Family History  Problem Relation Age of Onset  . Cancer Brother     renal ,also sister  . COPD Brother   . Hypertension Brother   . Diabetes Brother   . Kidney disease Brother   . Diabetes Brother     and sister  . Breast cancer      sister  . Kidney disease    . Cancer Mother     Breast Cancer  . Diabetes Mother   . Hypertension Mother   . Glaucoma Mother   . Diabetes Sister   . Hypertension Sister   . Heart disease Sister     Allergies   Allergen Reactions  . K-Dur [Potassium Chloride] Swelling    Face Swelling  . Nsaids     H/o PUD with perforation    Current Outpatient Prescriptions on File Prior to Visit  Medication Sig Dispense Refill  . feeding supplement, ENSURE ENLIVE, (ENSURE ENLIVE) LIQD Take 237 mLs by mouth 2 (two) times daily between meals. 237 mL 12  . HYDROcodone-acetaminophen (NORCO) 10-325 MG per tablet Take 0.5-2 tablets by mouth every 6 (six) hours as needed for moderate pain or severe pain (Prescribed by Dr. Maryjean Ka). 40 tablet 0  . VOLTAREN 1 % GEL Apply 1 application topically daily as needed. FOR HANDS  2   No current facility-administered medications on file prior to visit.    BP 160/80 mmHg  Pulse 76  Temp(Src) 97.6 F (36.4 C) (Oral)  Resp 18  Ht 4\' 8"  (1.422 m)  Wt 138 lb (62.596 kg)  BMI 30.96 kg/m2  SpO2 97%     Review of Systems  Constitutional: Positive for fatigue.  HENT: Negative for congestion, dental problem, hearing loss, rhinorrhea, sinus pressure, sore throat and tinnitus.   Eyes: Negative for pain, discharge and visual disturbance.  Respiratory: Negative for cough and shortness of breath.   Cardiovascular: Positive for leg swelling. Negative for chest pain and palpitations.  Gastrointestinal: Negative for nausea, vomiting, abdominal pain, diarrhea, constipation, blood in stool and abdominal distention.  Genitourinary: Negative for dysuria, urgency, frequency, hematuria, flank pain, vaginal bleeding, vaginal discharge, difficulty urinating, vaginal pain and pelvic pain.  Musculoskeletal: Negative for joint swelling, arthralgias and gait problem.  Skin: Negative for rash.  Neurological: Negative for dizziness, syncope, speech difficulty, weakness, numbness and headaches.  Hematological: Negative for adenopathy.  Psychiatric/Behavioral: Negative for behavioral problems, dysphoric mood and agitation. The patient is not nervous/anxious.        Objective:   Physical Exam   Constitutional: She is oriented to person, place, and time. She appears well-developed and well-nourished. No distress.  Repeat blood pressure 142/82  HENT:  Head: Normocephalic.  Right Ear: External ear normal.  Left Ear: External ear normal.  Mouth/Throat: Oropharynx is clear and moist.  Eyes: Conjunctivae and EOM are normal. Pupils are equal, round, and reactive to light.  Neck: Normal range of motion. Neck supple. No thyromegaly present.  Cardiovascular: Normal rate, regular rhythm, normal heart sounds and intact distal pulses.   Pulmonary/Chest: Effort normal and breath sounds normal. No respiratory distress. She has no wheezes.  O2 saturation 97  Abdominal: Soft. Bowel  sounds are normal. She exhibits no distension and no mass. There is no tenderness. There is no rebound and no guarding.  Musculoskeletal: Normal range of motion. She exhibits edema.  Plus 2 edema distal to the knees  Lymphadenopathy:    She has no cervical adenopathy.  Neurological: She is alert and oriented to person, place, and time.  Skin: Skin is warm and dry. No rash noted.  Psychiatric: She has a normal mood and affect. Her behavior is normal.          Assessment & Plan:  Essential hypertension.  Stable no change in medication Solitary pulmonary nodule.  Will set up for probable pulmonary consultation.  Patient will need PET scanning or possibly repeat chest CT Right renal lesion.  Will schedule renal MR with and without contrast Status post perforated gastric ulcer Pedal edema.  Patient will continue to take furosemide when necessary  The patient is scheduled to establish with a new provider at [facility] in July.  She states that she wishes to be followed here until her July appointment as a new patient

## 2015-03-16 NOTE — Patient Instructions (Addendum)
Limit your sodium (Salt) intake  Pulmonary consultation as discussed  MRI of the right kidney as discussed  Return in 3 months for follow-up  Report any new or worsening symptoms  Cardiology follow-up as scheduled

## 2015-03-23 ENCOUNTER — Encounter: Payer: Self-pay | Admitting: Pulmonary Disease

## 2015-03-23 ENCOUNTER — Ambulatory Visit (INDEPENDENT_AMBULATORY_CARE_PROVIDER_SITE_OTHER): Payer: Medicare Other | Admitting: Pulmonary Disease

## 2015-03-23 VITALS — BP 108/78 | HR 75 | Ht <= 58 in | Wt 136.4 lb

## 2015-03-23 DIAGNOSIS — R911 Solitary pulmonary nodule: Secondary | ICD-10-CM | POA: Diagnosis not present

## 2015-03-23 NOTE — Progress Notes (Signed)
. Past medical history She  has a past medical history of Insomnia; Obesity; Menopausal syndrome; Tobacco abuse; Low back pain; Bronchitis; Abnormal finding on Pap smear; Hypertension; Constipation; Arthritis; Joint pain; and Joint swelling.  Past surgical history She  has past surgical history that includes Tonsillectomy; Dilation and curettage of uterus; Ventral hernia repair (01/24/2011); Maximum access (mas)posterior lumbar interbody fusion (plif) 1 level (N/A, 11/04/2012); and laparotomy (N/A, 10/16/2014).  Family history Her family history includes COPD in her brother; Cancer in her brother and mother; Diabetes in her brother, brother, mother, and sister; Glaucoma in her mother; Heart disease in her sister; Hypertension in her brother, mother, and sister; Kidney disease in her brother.  Social history She  reports that she quit smoking about 5 months ago. Her smoking use included Cigarettes. She has a 6.25 pack-year smoking history. She has never used smokeless tobacco. She reports that she does not drink alcohol or use illicit drugs.  Allergies  Allergen Reactions  . K-Dur [Potassium Chloride] Swelling    Face Swelling  . Nsaids     H/o PUD with perforation    Current Outpatient Prescriptions on File Prior to Visit  Medication Sig  . bifidobacterium infantis (ALIGN) capsule Take 1 capsule by mouth daily.  [name] diltiazem (CARDIZEM CD) 360 MG 24 hr capsule Take 360 mg by mouth daily.  . feeding supplement, ENSURE ENLIVE, (ENSURE ENLIVE) LIQD Take 237 mLs by mouth 2 (two) times daily between meals.  . furosemide (LASIX) 20 MG tablet Take 20 mg by mouth daily.  [name] HYDROcodone-acetaminophen (NORCO) 10-325 MG per tablet Take 0.5-2 tablets by mouth every 6 (six) hours as needed for moderate pain or severe pain (Prescribed by Dr. Maryjean Ka).  [name] omeprazole (PRILOSEC) 40 MG capsule Take 40 mg by mouth daily.  . polyethylene glycol (MIRALAX / GLYCOLAX) packet Take 17 g by mouth daily as needed.   .  promethazine (PHENERGAN) 25 MG tablet Take 25 mg by mouth every 6 (six) hours.  . VOLTAREN 1 % GEL Apply 1 application topically daily as needed. FOR HANDS  . zolpidem (AMBIEN) 10 MG tablet TAKE ONE TABLET BY MOUTH AT BEDTIME AS NEEDED ONCE A DAY   No current facility-administered medications on file prior to visit.   Review of Systems  Constitutional: Positive for appetite change and unexpected weight change. Negative for fever.  HENT: Positive for dental problem. Negative for congestion, ear pain, nosebleeds, postnasal drip, rhinorrhea, sinus pressure, sneezing, sore throat and trouble swallowing.   Eyes: Negative for redness and itching.  Respiratory: Negative for cough, chest tightness, shortness of breath and wheezing.   Cardiovascular: Negative for palpitations and leg swelling.  Gastrointestinal: Negative for nausea and vomiting.  Genitourinary: Negative for dysuria.  Musculoskeletal: Positive for joint swelling.  Skin: Negative for rash.  Neurological: Negative for headaches.  Hematological: Does not bruise/bleed easily.  Psychiatric/Behavioral: Negative for dysphoric mood. The patient is not nervous/anxious.    Chief Complaint  Patient presents with  . PULMONARY CONSULT    Referred by Dr Nonie Hoyer for pulmonay nodules.    Tests CT chest 10/16/14 >>  RLL nodule 1.1 cm  Vital signs BP 108/78 mmHg  Pulse 75  Ht 4\' 8"  (1.422 m)  Wt 136 lb 6.4 oz (61.871 kg)  BMI 30.60 kg/m2  SpO2 95%  History of present illness Sally Douglas is a 70 y.o. female former smoker for evaluation of lung nodule.  She was in hospital in September 2016 for perforated peptic ulcer.  She  had CT chest at that time which showed 1.1 cm nodule RLL.  She was advised this needed follow up.  She quit smoking in September 2016.  She had pneumonia years ago.  She denies hx of tuberculosis.  She has not had cough, fever, hemoptysis, or sweats.  She has heard herself wheeze sometimes, but not recently.   She denies chest pain, skin rash, or joint swelling.  Physical exam  General - No distress ENT - No sinus tenderness, no oral exudate, no LAN, no thyromegaly, TM clear, pupils equal/reactive Cardiac - s1s2 regular, no murmur, pulses symmetric Chest - No wheeze/rales/dullness, good air entry, normal respiratory excursion Back - No focal tenderness Abd - dressing over mid abdomen clean Ext - 1+ lower extremity edema Neuro - Normal strength, cranial nerves intact Skin - No rashes Psych - Normal mood, and behavior    Lab Results  Component Value Date   WBC 11.3* 03/16/2015   HGB 12.3 03/16/2015   HCT 37.9 03/16/2015   MCV 89.8 03/16/2015   PLT 364.0 03/16/2015    Lab Results  Component Value Date   CREATININE 1.04 03/16/2015   BUN 13 03/16/2015   NA 147* 03/16/2015   K 4.9 03/16/2015   CL 106 03/16/2015   CO2 30 03/16/2015    Lab Results  Component Value Date   ALT 9 03/16/2015   AST 12 03/16/2015   ALKPHOS 102 03/16/2015   BILITOT 0.5 03/16/2015    Lab Results  Component Value Date   TSH 0.59 11/29/2013     Discussion 70 yo female former smoker with incidental finding of 1.1 cm right lower lung nodule on CT chest from September 2016 when she was being treated for perforated peptic ulcer.   Assessment/plan  Rt lower lung 1.1 cm pulmonary nodule. Plan: - will arrange for repeat CT chest with IV contrast >> depending on results will determine if additional intervention is needed  Abdominal wall wound. Plan: - advised her to d/w surgery   Patient Instructions  Will schedule CT chest Will call to schedule follow up after CT chest reviewed     Chesley Mires, MD [facility] Pager:  [phone]

## 2015-03-23 NOTE — Patient Instructions (Signed)
Will schedule CT chest Will call to schedule follow up after CT chest reviewed 

## 2015-03-23 NOTE — Progress Notes (Signed)
   Subjective:    Patient ID: Sally Douglas, female    DOB: Feb 24, 1945, 70 y.o.   MRN: [id number]  HPI    Review of Systems  Constitutional: Positive for appetite change and unexpected weight change. Negative for fever.  HENT: Positive for dental problem. Negative for congestion, ear pain, nosebleeds, postnasal drip, rhinorrhea, sinus pressure, sneezing, sore throat and trouble swallowing.   Eyes: Negative for redness and itching.  Respiratory: Negative for cough, chest tightness, shortness of breath and wheezing.   Cardiovascular: Negative for palpitations and leg swelling.  Gastrointestinal: Negative for nausea and vomiting.  Genitourinary: Negative for dysuria.  Musculoskeletal: Positive for joint swelling.  Skin: Negative for rash.  Neurological: Negative for headaches.  Hematological: Does not bruise/bleed easily.  Psychiatric/Behavioral: Negative for dysphoric mood. The patient is not nervous/anxious.        Objective:   Physical Exam        Assessment & Plan:

## 2015-03-27 ENCOUNTER — Ambulatory Visit (INDEPENDENT_AMBULATORY_CARE_PROVIDER_SITE_OTHER)
Admission: RE | Admit: 2015-03-27 | Discharge: 2015-03-27 | Disposition: A | Discharge status: Home / Self Care | Payer: Medicare Other | Source: Ambulatory Visit | Attending: Internal Medicine | Admitting: Internal Medicine

## 2015-03-27 ENCOUNTER — Telehealth: Payer: Self-pay | Admitting: Pulmonary Disease

## 2015-03-27 DIAGNOSIS — R911 Solitary pulmonary nodule: Secondary | ICD-10-CM | POA: Diagnosis not present

## 2015-03-27 MED ORDER — IOHEXOL 300 MG/ML  SOLN
80.0000 mL | Freq: Once | INTRAMUSCULAR | Status: AC | PRN
  Administered 2015-03-27: 80 mL via INTRAVENOUS

## 2015-03-27 NOTE — Telephone Encounter (Signed)
Received call report on pt's CT Chest from today 2.27.17:  IMPRESSION: Persistent solid right lower lobe pulmonary nodule measuring 11 mm on today's study compared to 10 mm previously. A small bronchogenic carcinoma cannot be excluded. Consider PET-CT scan for further evaluation.   No thoracic metastatic disease identified.  VS is not available this afternoon - will forward to him for review tomorrow.  Will mark urgent d/t nature of the results.

## 2015-03-28 ENCOUNTER — Ambulatory Visit
Admission: RE | Admit: 2015-03-28 | Discharge: 2015-03-28 | Disposition: A | Discharge status: Home / Self Care | Payer: Medicare Other | Source: Ambulatory Visit | Attending: Internal Medicine | Admitting: Internal Medicine

## 2015-03-28 DIAGNOSIS — N289 Disorder of kidney and ureter, unspecified: Secondary | ICD-10-CM

## 2015-03-28 DIAGNOSIS — K8020 Calculus of gallbladder without cholecystitis without obstruction: Secondary | ICD-10-CM | POA: Diagnosis not present

## 2015-03-28 MED ORDER — GADOBENATE DIMEGLUMINE 529 MG/ML IV SOLN
12.0000 mL | Freq: Once | INTRAVENOUS | Status: AC | PRN
  Administered 2015-03-28: 12 mL via INTRAVENOUS

## 2015-03-30 NOTE — Telephone Encounter (Signed)
Spoke with pt and advised that we will have Dr Halford Chessman review results and call her back.  She verbalized understanding.  Dr Halford Chessman, please advise on results.

## 2015-03-30 NOTE — Telephone Encounter (Signed)
CT chest 03/27/15 >> RLL nodule 11 mm  D/w pt.  I measured nodule to be 11 mm now and was also 11 mm on CT chest from 10/16/14.  In my review of CT scans there is no appreciable difference in size of lesion.  Explained to pt options are 1) radiographic f/u, 2) PET scan, 3) biopsy.  Since, in my opinion, the lesion has not changed she is agreeable to continue with radiographic follow up.  Will schedule repeat CT chest w/o contrast for August 2017.  Will have my nurse schedule ROV for August 2017 after f/u CT chest is done.

## 2015-03-31 NOTE — Telephone Encounter (Signed)
Pt is aware of results. She agrees with VS and would like to just have the CT repeated in August. Order has already been placed for this. Nothing further was needed.

## 2015-04-12 ENCOUNTER — Ambulatory Visit (INDEPENDENT_AMBULATORY_CARE_PROVIDER_SITE_OTHER): Payer: Medicare Other | Admitting: Adult Health

## 2015-04-12 ENCOUNTER — Encounter: Payer: Self-pay | Admitting: Adult Health

## 2015-04-12 VITALS — BP 150/80 | HR 90 | Temp 97.8°F | Ht <= 58 in | Wt 134.6 lb

## 2015-04-12 DIAGNOSIS — R197 Diarrhea, unspecified: Secondary | ICD-10-CM

## 2015-04-12 DIAGNOSIS — L299 Pruritus, unspecified: Secondary | ICD-10-CM | POA: Diagnosis not present

## 2015-04-12 DIAGNOSIS — R1110 Vomiting, unspecified: Secondary | ICD-10-CM | POA: Diagnosis not present

## 2015-04-12 DIAGNOSIS — L259 Unspecified contact dermatitis, unspecified cause: Secondary | ICD-10-CM

## 2015-04-12 LAB — BASIC METABOLIC PANEL
BUN: 7 mg/dL (ref 6–23)
CHLORIDE: 103 meq/L (ref 96–112)
CO2: 30 meq/L (ref 19–32)
Calcium: 9 mg/dL (ref 8.4–10.5)
Creatinine, Ser: 1.03 mg/dL (ref 0.40–1.20)
GFR: 56.31 mL/min — ABNORMAL LOW (ref >60.00)
Glucose, Bld: 101 mg/dL — ABNORMAL HIGH (ref 70–99)
POTASSIUM: 3.2 meq/L — AB (ref 3.5–5.1)
Sodium: 142 mEq/L (ref 135–145)

## 2015-04-12 LAB — CBC WITH DIFFERENTIAL/PLATELET
BASOS PCT: 0.6 % (ref 0.0–3.0)
Basophils Absolute: 0.1 10*3/uL (ref 0.0–0.1)
EOS PCT: 6.6 % — AB (ref 0.0–5.0)
Eosinophils Absolute: 0.6 10*3/uL (ref 0.0–0.7)
HEMATOCRIT: 37 % (ref 36.0–46.0)
HEMOGLOBIN: 12.3 g/dL (ref 12.0–15.0)
LYMPHS PCT: 22.7 % (ref 12.0–46.0)
Lymphs Abs: 2.2 10*3/uL (ref 0.7–4.0)
MCHC: 33.3 g/dL (ref 30.0–36.0)
MCV: 88.8 fl (ref 78.0–100.0)
Monocytes Absolute: 0.5 10*3/uL (ref 0.1–1.0)
Monocytes Relative: 5.5 % (ref 3.0–12.0)
NEUTROS ABS: 6.3 10*3/uL (ref 1.4–7.7)
Neutrophils Relative %: 64.6 % (ref 43.0–77.0)
Platelets: 331 10*3/uL (ref 150.0–400.0)
RBC: 4.17 Mil/uL (ref 3.87–5.11)
RDW: 14.2 % (ref 11.5–15.5)
WBC: 9.7 10*3/uL (ref 4.0–10.5)

## 2015-04-12 MED ORDER — HYDROXYZINE HCL 10 MG PO TABS: 10.0000 mg | ORAL_TABLET | Freq: Three times a day (TID) | ORAL | Status: DC | PRN

## 2015-04-12 MED ORDER — METHYLPREDNISOLONE ACETATE 80 MG/ML IJ SUSP
80.0000 mg | Freq: Once | INTRAMUSCULAR | Status: AC
  Administered 2015-04-12: 80 mg via INTRAMUSCULAR

## 2015-04-12 NOTE — Patient Instructions (Addendum)
It was great meeting you today!  I am sorry you are feeling badly.   I have sent in a prescription for Atarax, if this cortisone shot does not work then you can use that   I will follow up with you regarding your blood work  Use Pepto or Zofran for the nausea.   Follow up with Dr. Raliegh Ip if your symptoms do not improve.

## 2015-04-12 NOTE — Progress Notes (Deleted)
Patient presents to clinic today to establish care.  Acute Concerns:   Chronic Issues:   Health Maintenance: Dental -- Vision -- Immunizations -- Colonoscopy --   Past Medical History  Diagnosis Date  . Insomnia     takes Ambien nightly  . Obesity   . Menopausal syndrome   . Tobacco abuse   . Low back pain   . Bronchitis     hx of-many yrs ago  . Abnormal finding on Pap smear     hx abnl pap  . Hypertension     takes Diltiazem and HCTZ daily  . Constipation     takes Miralax daily  . Arthritis   . Joint pain   . Joint swelling     Past Surgical History  Procedure Laterality Date  . Tonsillectomy    . Dilation and curettage of uterus    . Ventral hernia repair  01/24/2011    Procedure: LAPAROSCOPIC VENTRAL HERNIA;  Surgeon: Adin Hector, MD;  Location: [facility];  Service: General;  Laterality: N/A;  with Mesh  . Maximum access (mas)posterior lumbar interbody fusion (plif) 1 level N/A 11/04/2012    Procedure: Lumbar four-five Maximum access posterior lumbar interbody fusion with Nuvasive;  Surgeon: Eustace Moore, MD;  Location: [place];  Service: Neurosurgery;  Laterality: N/A;  Lumbar four-five Maximum access posterior lumbar interbody fusion with Nuvasive  . Laparotomy N/A 10/16/2014    Procedure: EXPLORATORY LAPAROTOMY, REPAIR OF GASTRIC ULCER;  Surgeon: Georganna Skeans, MD;  Location: [place];  Service: General;  Laterality: N/A;    Current Outpatient Prescriptions on File Prior to Visit  Medication Sig Dispense Refill  . bifidobacterium infantis (ALIGN) capsule Take 1 capsule by mouth daily.    [name] diltiazem (CARDIZEM CD) 360 MG 24 hr capsule Take 360 mg by mouth daily.  3  . feeding supplement, ENSURE ENLIVE, (ENSURE ENLIVE) LIQD Take 237 mLs by mouth 2 (two) times daily between meals. 237 mL 12  . furosemide (LASIX) 20 MG tablet Take 20 mg by mouth daily.  1  . HYDROcodone-acetaminophen (NORCO) 10-325 MG per tablet Take 0.5-2 tablets by mouth every 6  (six) hours as needed for moderate pain or severe pain (Prescribed by Dr. Maryjean Ka). 40 tablet 0  . omeprazole (PRILOSEC) 40 MG capsule Take 40 mg by mouth daily.  12  . polyethylene glycol (MIRALAX / GLYCOLAX) packet Take 17 g by mouth daily as needed.     . promethazine (PHENERGAN) 25 MG tablet Take 25 mg by mouth every 6 (six) hours.  0  . VOLTAREN 1 % GEL Apply 1 application topically daily as needed. FOR HANDS  2  . zolpidem (AMBIEN) 10 MG tablet TAKE ONE TABLET BY MOUTH AT BEDTIME AS NEEDED ONCE A DAY  2   No current facility-administered medications on file prior to visit.    Allergies  Allergen Reactions  . K-Dur [Potassium Chloride] Swelling    Face Swelling  . Nsaids     H/o PUD with perforation    Family History  Problem Relation Age of Onset  . Cancer Brother     renal ,also sister  . COPD Brother   . Hypertension Brother   . Diabetes Brother   . Kidney disease Brother   . Diabetes Brother     and sister  . Breast cancer      sister  . Kidney disease    . Cancer Mother     Breast Cancer  .  Diabetes Mother   . Hypertension Mother   . Glaucoma Mother   . Diabetes Sister   . Hypertension Sister   . Heart disease Sister     Social History   Social History  . Marital Status: Married    Spouse Name: N/A  . Number of Children: N/A  . Years of Education: N/A   Occupational History  . retired    Social History Main Topics  . Smoking status: Former Smoker -- 0.25 packs/day for 25 years    Types: Cigarettes    Quit date: 10/16/2014  . Smokeless tobacco: Never Used     Comment: Used Electronic Cigarettes until quit  . Alcohol Use: No  . Drug Use: No  . Sexual Activity: Not Currently    Birth Control/ Protection: Post-menopausal   Other Topics Concern  . Not on file   Social History Narrative   gavidia 1   Para 1    aborta 0    ROS  There were no vitals taken for this visit.  Physical Exam  Recent Results (from the past 2160 hour(s))    Comprehensive metabolic panel     Status: Abnormal   Collection Time: 03/16/15 10:21 AM  Result Value Ref Range   Sodium 147 (H) 135 - 145 mEq/L   Potassium 4.9 3.5 - 5.1 mEq/L   Chloride 106 96 - 112 mEq/L   CO2 30 19 - 32 mEq/L   Glucose, Bld 137 (H) 70 - 99 mg/dL   BUN 13 6 - 23 mg/dL   Creatinine, Ser 1.04 0.40 - 1.20 mg/dL   Total Bilirubin 0.5 0.2 - 1.2 mg/dL   Alkaline Phosphatase 102 39 - 117 U/L   AST 12 0 - 37 U/L   ALT 9 0 - 35 U/L   Total Protein 6.6 6.0 - 8.3 g/dL   Albumin 3.8 3.5 - 5.2 g/dL   Calcium 9.8 8.4 - 10.5 mg/dL   GFR 55.70 (L) >60.00 mL/min  CBC with Differential/Platelet     Status: Abnormal   Collection Time: 03/16/15 10:21 AM  Result Value Ref Range   WBC 11.3 (H) 4.0 - 10.5 K/uL   RBC 4.22 3.87 - 5.11 Mil/uL   Hemoglobin 12.3 12.0 - 15.0 g/dL   HCT 37.9 36.0 - 46.0 %   MCV 89.8 78.0 - 100.0 fl   MCHC 32.3 30.0 - 36.0 g/dL   RDW 16.1 (H) 11.5 - 15.5 %   Platelets 364.0 150.0 - 400.0 K/uL   Neutrophils Relative % 70.5 43.0 - 77.0 %   Lymphocytes Relative 20.2 12.0 - 46.0 %   Monocytes Relative 6.5 3.0 - 12.0 %   Eosinophils Relative 2.3 0.0 - 5.0 %   Basophils Relative 0.5 0.0 - 3.0 %   Neutro Abs 8.0 (H) 1.4 - 7.7 K/uL   Lymphs Abs 2.3 0.7 - 4.0 K/uL   Monocytes Absolute 0.7 0.1 - 1.0 K/uL   Eosinophils Absolute 0.3 0.0 - 0.7 K/uL   Basophils Absolute 0.1 0.0 - 0.1 K/uL    Assessment/Plan: No problem-specific assessment & plan notes found for this encounter.      Pre visit review using our clinic review tool, if applicable. No additional management support is needed unless otherwise documented below in the visit note.

## 2015-04-12 NOTE — Progress Notes (Signed)
Subjective:    Patient ID: Sally Douglas, female    DOB: [birth year], 70 y.o.   MRN: [id number]  HPI  70 year old female, patient of Dr. Raliegh Ip, presents to the office today with 2 acute complaints.  1) the last 2 weeks she complains of a sometimes blotchy sometimes red raised rash throughout her body. When the rash goes away she continues to be itchy. She's been taken oatmeal baths and using Aveeno lotion as well as Benadryl without much relief. She denies any changes in shampoos,laundry detergent, and diet.  2) 4 days ago [place]. While in the mountains her husband both decided a restaurant and they both had the same fish, soon after eating the fish the patient became nauseated and had diarrhea with bouts of vomiting. She continues to be nauseous, she last threw up less than 24 hours ago,and continues to have diarrhea today. Denies any fevers, no sick contacts. Her husband did not fall ill after eating the fish. He has tried using Phenergan but "it only makes me fall asleep".  Approximately 6 months ago she had abdominal surgery to repair a perforated ulcer.   Review of Systems  Constitutional: Positive for activity change and fatigue. Negative for fever, chills and diaphoresis.  HENT: Negative.   Respiratory: Negative.   Cardiovascular: Negative.   Gastrointestinal: Positive for nausea, vomiting, abdominal pain and diarrhea.  Musculoskeletal: Negative.   Skin: Positive for color change and rash. Negative for pallor and wound.  Neurological: Negative.    Past Medical History  Diagnosis Date  . Insomnia     takes Ambien nightly  . Obesity   . Menopausal syndrome   . Tobacco abuse   . Low back pain   . Bronchitis     hx of-many yrs ago  . Abnormal finding on Pap smear     hx abnl pap  . Hypertension     takes Diltiazem and HCTZ daily  . Constipation     takes Miralax daily  . Arthritis   . Joint pain   . Joint swelling     Social History    Social History  . Marital Status: Married    Spouse Name: N/A  . Number of Children: N/A  . Years of Education: N/A   Occupational History  . retired    Social History Main Topics  . Smoking status: Former Smoker -- 0.25 packs/day for 25 years    Types: Cigarettes    Quit date: 10/16/2014  . Smokeless tobacco: Never Used     Comment: Used Electronic Cigarettes until quit  . Alcohol Use: No  . Drug Use: No  . Sexual Activity: Not Currently    Birth Control/ Protection: Post-menopausal   Other Topics Concern  . Not on file   Social History Narrative   gavidia 1   Para 1    aborta 0    Past Surgical History  Procedure Laterality Date  . Tonsillectomy    . Dilation and curettage of uterus    . Ventral hernia repair  01/24/2011    Procedure: LAPAROSCOPIC VENTRAL HERNIA;  Surgeon: Adin Hector, MD;  Location: [facility];  Service: General;  Laterality: N/A;  with Mesh  . Maximum access (mas)posterior lumbar interbody fusion (plif) 1 level N/A 11/04/2012    Procedure: Lumbar four-five Maximum access posterior lumbar interbody fusion with Nuvasive;  Surgeon: Eustace Moore, MD;  Location: [place];  Service: Neurosurgery;  Laterality: N/A;  Lumbar four-five Maximum access posterior lumbar interbody fusion with Nuvasive  . Laparotomy N/A 10/16/2014    Procedure: EXPLORATORY LAPAROTOMY, REPAIR OF GASTRIC ULCER;  Surgeon: Georganna Skeans, MD;  Location: [facility];  Service: General;  Laterality: N/A;    Family History  Problem Relation Age of Onset  . Cancer Brother     renal ,also sister  . COPD Brother   . Hypertension Brother   . Diabetes Brother   . Kidney disease Brother   . Diabetes Brother     and sister  . Breast cancer      sister  . Kidney disease    . Cancer Mother     Breast Cancer  . Diabetes Mother   . Hypertension Mother   . Glaucoma Mother   . Diabetes Sister   . Hypertension Sister   . Heart disease Sister     Allergies  Allergen Reactions  .  K-Dur [Potassium Chloride] Swelling    Face Swelling  . Nsaids     H/o PUD with perforation    Current Outpatient Prescriptions on File Prior to Visit  Medication Sig Dispense Refill  . bifidobacterium infantis (ALIGN) capsule Take 1 capsule by mouth daily.    [name] diltiazem (CARDIZEM CD) 360 MG 24 hr capsule Take 360 mg by mouth daily.  3  . feeding supplement, ENSURE ENLIVE, (ENSURE ENLIVE) LIQD Take 237 mLs by mouth 2 (two) times daily between meals. 237 mL 12  . furosemide (LASIX) 20 MG tablet Take 20 mg by mouth daily.  1  . HYDROcodone-acetaminophen (NORCO) 10-325 MG per tablet Take 0.5-2 tablets by mouth every 6 (six) hours as needed for moderate pain or severe pain (Prescribed by Dr. Maryjean Ka). 40 tablet 0  . omeprazole (PRILOSEC) 40 MG capsule Take 40 mg by mouth daily.  12  . polyethylene glycol (MIRALAX / GLYCOLAX) packet Take 17 g by mouth daily as needed.     . promethazine (PHENERGAN) 25 MG tablet Take 25 mg by mouth every 6 (six) hours.  0  . VOLTAREN 1 % GEL Apply 1 application topically daily as needed. FOR HANDS  2  . zolpidem (AMBIEN) 10 MG tablet TAKE ONE TABLET BY MOUTH AT BEDTIME AS NEEDED ONCE A DAY  2   No current facility-administered medications on file prior to visit.    BP 150/80 mmHg  Pulse 90  Temp(Src) 97.8 F (36.6 C) (Oral)  Ht 4\' 8"  (1.422 m)  Wt 134 lb 9.6 oz (61.054 kg)  BMI 30.19 kg/m2  SpO2 96%       Objective:   Physical Exam  Constitutional: She is oriented to person, place, and time. She appears well-developed and well-nourished. No distress.  HENT:  Head: Normocephalic and atraumatic.  Right Ear: External ear normal.  Left Ear: External ear normal.  Nose: Nose normal.  Mouth/Throat: No oropharyngeal exudate.  Eyes: Conjunctivae and EOM are normal. Pupils are equal, round, and reactive to light. Right eye exhibits no discharge. Left eye exhibits no discharge.  Cardiovascular: Normal rate, regular rhythm, normal heart sounds and intact  distal pulses.  Exam reveals no gallop and no friction rub.   No murmur heard. Pulmonary/Chest: Effort normal and breath sounds normal. No respiratory distress. She has no wheezes. She has no rales. She exhibits no tenderness.  Abdominal: Soft. Bowel sounds are normal. She exhibits no distension. There is tenderness.  Neurological: She is alert and oriented to person, place, and time.  Skin: Skin is warm and dry.  Rash noted. She is not diaphoretic. No erythema. No pallor.   Scar on abdomen from previous surgery  Areas throughout torso that are red and blotchy  Psychiatric: She has a normal mood and affect. Her behavior is normal. Judgment and thought content normal.  Nursing note and vitals reviewed.      Assessment & Plan:  1. Vomiting and diarrhea - I would not expect food poisoning to last longer than 24 hours. Likely this is more of a gastroenteritis, advised her to either use her Phenergan or Zofran which have already been prescribed. She can also use Pepto-Bismol as directed on the bottle. -Advised to introduced fluids slowly and work her way up to the more solid meals - Basic metabolic panel - CBC with Differential/Platelet -Follow-up if no improvement in the next 2 or 3 days   2. Contact dermatitis - Did not see any tracking or bug bites.  - hydrOXYzine (ATARAX/VISTARIL) 10 MG tablet; Take 1 tablet (10 mg total) by mouth 3 (three) times daily as needed.  Dispense: 30 tablet; Refill: 0 - methylPREDNISolone acetate (DEPO-MEDROL) injection 80 mg; Inject 1 mL (80 mg total) into the muscle once.

## 2015-04-25 ENCOUNTER — Other Ambulatory Visit: Payer: Self-pay | Admitting: Internal Medicine

## 2015-04-27 ENCOUNTER — Telehealth: Payer: Self-pay | Admitting: Internal Medicine

## 2015-04-27 MED ORDER — ZOLPIDEM TARTRATE 10 MG PO TABS: 10.0000 mg | ORAL_TABLET | Freq: Every evening | ORAL | Status: DC | PRN

## 2015-04-27 NOTE — Telephone Encounter (Signed)
Pt said she contacted the pharmacy and they told her they donot have the following rx zolpidem (AMBIEN) 10 MG tablet   Can you check on this please   Pharmacy [facility] [address]

## 2015-04-27 NOTE — Telephone Encounter (Signed)
Pt notified Rx called into pharmacy again not sure why they did not have. Pt verbalized understanding.

## 2015-05-01 DIAGNOSIS — M4806 Spinal stenosis, lumbar region: Secondary | ICD-10-CM | POA: Diagnosis not present

## 2015-05-01 DIAGNOSIS — M545 Low back pain: Secondary | ICD-10-CM | POA: Diagnosis not present

## 2015-05-02 ENCOUNTER — Encounter (HOSPITAL_COMMUNITY): Payer: Self-pay | Admitting: Emergency Medicine

## 2015-05-02 ENCOUNTER — Inpatient Hospital Stay (HOSPITAL_COMMUNITY)
Admission: EM | Admit: 2015-05-02 | Discharge: 2015-05-07 | DRG: 418 | Disposition: A | Discharge status: Home / Self Care | Payer: Medicare Other | Attending: Internal Medicine | Admitting: Internal Medicine

## 2015-05-02 ENCOUNTER — Emergency Department (HOSPITAL_COMMUNITY): Payer: Medicare Other

## 2015-05-02 ENCOUNTER — Inpatient Hospital Stay (HOSPITAL_COMMUNITY): Payer: Medicare Other

## 2015-05-02 DIAGNOSIS — K819 Cholecystitis, unspecified: Secondary | ICD-10-CM | POA: Diagnosis not present

## 2015-05-02 DIAGNOSIS — I428 Other cardiomyopathies: Secondary | ICD-10-CM

## 2015-05-02 DIAGNOSIS — Z6830 Body mass index (BMI) 30.0-30.9, adult: Secondary | ICD-10-CM | POA: Diagnosis not present

## 2015-05-02 DIAGNOSIS — K859 Acute pancreatitis without necrosis or infection, unspecified: Secondary | ICD-10-CM | POA: Diagnosis present

## 2015-05-02 DIAGNOSIS — K8050 Calculus of bile duct without cholangitis or cholecystitis without obstruction: Secondary | ICD-10-CM | POA: Diagnosis not present

## 2015-05-02 DIAGNOSIS — R918 Other nonspecific abnormal finding of lung field: Secondary | ICD-10-CM

## 2015-05-02 DIAGNOSIS — Z888 Allergy status to other drugs, medicaments and biological substances status: Secondary | ICD-10-CM | POA: Diagnosis not present

## 2015-05-02 DIAGNOSIS — K8064 Calculus of gallbladder and bile duct with chronic cholecystitis without obstruction: Secondary | ICD-10-CM | POA: Diagnosis present

## 2015-05-02 DIAGNOSIS — R911 Solitary pulmonary nodule: Secondary | ICD-10-CM | POA: Diagnosis not present

## 2015-05-02 DIAGNOSIS — Z79899 Other long term (current) drug therapy: Secondary | ICD-10-CM

## 2015-05-02 DIAGNOSIS — K858 Other acute pancreatitis without necrosis or infection: Secondary | ICD-10-CM | POA: Diagnosis not present

## 2015-05-02 DIAGNOSIS — I429 Cardiomyopathy, unspecified: Secondary | ICD-10-CM | POA: Diagnosis present

## 2015-05-02 DIAGNOSIS — E440 Moderate protein-calorie malnutrition: Secondary | ICD-10-CM | POA: Diagnosis present

## 2015-05-02 DIAGNOSIS — G4700 Insomnia, unspecified: Secondary | ICD-10-CM | POA: Diagnosis present

## 2015-05-02 DIAGNOSIS — M549 Dorsalgia, unspecified: Secondary | ICD-10-CM | POA: Diagnosis not present

## 2015-05-02 DIAGNOSIS — K851 Biliary acute pancreatitis without necrosis or infection: Principal | ICD-10-CM

## 2015-05-02 DIAGNOSIS — M1990 Unspecified osteoarthritis, unspecified site: Secondary | ICD-10-CM | POA: Diagnosis not present

## 2015-05-02 DIAGNOSIS — R935 Abnormal findings on diagnostic imaging of other abdominal regions, including retroperitoneum: Secondary | ICD-10-CM | POA: Diagnosis not present

## 2015-05-02 DIAGNOSIS — K5900 Constipation, unspecified: Secondary | ICD-10-CM | POA: Diagnosis present

## 2015-05-02 DIAGNOSIS — Z8249 Family history of ischemic heart disease and other diseases of the circulatory system: Secondary | ICD-10-CM

## 2015-05-02 DIAGNOSIS — E669 Obesity, unspecified: Secondary | ICD-10-CM | POA: Diagnosis present

## 2015-05-02 DIAGNOSIS — I10 Essential (primary) hypertension: Secondary | ICD-10-CM | POA: Diagnosis not present

## 2015-05-02 DIAGNOSIS — K8010 Calculus of gallbladder with chronic cholecystitis without obstruction: Secondary | ICD-10-CM | POA: Diagnosis not present

## 2015-05-02 DIAGNOSIS — E876 Hypokalemia: Secondary | ICD-10-CM | POA: Diagnosis present

## 2015-05-02 DIAGNOSIS — I110 Hypertensive heart disease with heart failure: Secondary | ICD-10-CM | POA: Diagnosis present

## 2015-05-02 DIAGNOSIS — R109 Unspecified abdominal pain: Secondary | ICD-10-CM

## 2015-05-02 DIAGNOSIS — Z87891 Personal history of nicotine dependence: Secondary | ICD-10-CM

## 2015-05-02 DIAGNOSIS — I5032 Chronic diastolic (congestive) heart failure: Secondary | ICD-10-CM | POA: Diagnosis present

## 2015-05-02 DIAGNOSIS — I5189 Other ill-defined heart diseases: Secondary | ICD-10-CM

## 2015-05-02 DIAGNOSIS — R1013 Epigastric pain: Secondary | ICD-10-CM | POA: Diagnosis not present

## 2015-05-02 DIAGNOSIS — Z886 Allergy status to analgesic agent status: Secondary | ICD-10-CM

## 2015-05-02 DIAGNOSIS — Z01810 Encounter for preprocedural cardiovascular examination: Secondary | ICD-10-CM | POA: Diagnosis not present

## 2015-05-02 DIAGNOSIS — K8020 Calculus of gallbladder without cholecystitis without obstruction: Secondary | ICD-10-CM | POA: Diagnosis not present

## 2015-05-02 HISTORY — DX: Chronic or unspecified gastric ulcer with perforation: K25.5

## 2015-05-02 LAB — CBC WITH DIFFERENTIAL/PLATELET
Basophils Absolute: 0 10*3/uL (ref 0.0–0.1)
Basophils Relative: 0 %
Eosinophils Absolute: 0.2 10*3/uL (ref 0.0–0.7)
Eosinophils Relative: 2 %
HCT: 43 % (ref 36.0–46.0)
Hemoglobin: 13.7 g/dL (ref 12.0–15.0)
Lymphocytes Relative: 16 %
Lymphs Abs: 2.1 10*3/uL (ref 0.7–4.0)
MCH: 29 pg (ref 26.0–34.0)
MCHC: 31.9 g/dL (ref 30.0–36.0)
MCV: 91.1 fL (ref 78.0–100.0)
Monocytes Absolute: 0.6 10*3/uL (ref 0.1–1.0)
Monocytes Relative: 5 %
Neutro Abs: 10.3 10*3/uL — ABNORMAL HIGH (ref 1.7–7.7)
Neutrophils Relative %: 77 %
Platelets: 322 10*3/uL (ref 150–400)
RBC: 4.72 MIL/uL (ref 3.87–5.11)
RDW: 13.9 % (ref 11.5–15.5)
WBC: 13.2 10*3/uL — ABNORMAL HIGH (ref 4.0–10.5)

## 2015-05-02 LAB — COMPREHENSIVE METABOLIC PANEL
ALT: 141 U/L — ABNORMAL HIGH (ref 14–54)
AST: 253 U/L — ABNORMAL HIGH (ref 15–41)
Albumin: 4.2 g/dL (ref 3.5–5.0)
Alkaline Phosphatase: 307 U/L — ABNORMAL HIGH (ref 38–126)
Anion gap: 16 — ABNORMAL HIGH (ref 5–15)
BUN: 12 mg/dL (ref 6–20)
CO2: 21 mmol/L — ABNORMAL LOW (ref 22–32)
Calcium: 9.9 mg/dL (ref 8.9–10.3)
Chloride: 103 mmol/L (ref 101–111)
Creatinine, Ser: 1.22 mg/dL — ABNORMAL HIGH (ref 0.44–1.00)
GFR calc Af Amer: 51 mL/min — ABNORMAL LOW (ref >60)
GFR calc non Af Amer: 44 mL/min — ABNORMAL LOW (ref >60)
Glucose, Bld: 110 mg/dL — ABNORMAL HIGH (ref 65–99)
Potassium: 3.8 mmol/L (ref 3.5–5.1)
Sodium: 140 mmol/L (ref 135–145)
Total Bilirubin: 2.5 mg/dL — ABNORMAL HIGH (ref 0.3–1.2)
Total Protein: 7.9 g/dL (ref 6.5–8.1)

## 2015-05-02 LAB — URINALYSIS, ROUTINE W REFLEX MICROSCOPIC
Bilirubin Urine: NEGATIVE
Glucose, UA: NEGATIVE mg/dL
Hgb urine dipstick: NEGATIVE
Ketones, ur: NEGATIVE mg/dL
Leukocytes, UA: NEGATIVE
Nitrite: NEGATIVE
Protein, ur: NEGATIVE mg/dL
Specific Gravity, Urine: 1.012 (ref 1.005–1.030)
pH: 7.5 (ref 5.0–8.0)

## 2015-05-02 LAB — LIPASE, BLOOD: Lipase: 2251 U/L — ABNORMAL HIGH (ref 11–51)

## 2015-05-02 MED ORDER — PANTOPRAZOLE SODIUM 40 MG PO TBEC
40.0000 mg | DELAYED_RELEASE_TABLET | Freq: Every day | ORAL | Status: DC
  Administered 2015-05-03 – 2015-05-07 (×4): 40 mg via ORAL
  Filled 2015-05-02 (×5): qty 1

## 2015-05-02 MED ORDER — TRAZODONE HCL 50 MG PO TABS: 25.0000 mg | ORAL_TABLET | Freq: Every evening | ORAL | Status: DC | PRN

## 2015-05-02 MED ORDER — SODIUM CHLORIDE 0.9 % IV BOLUS (SEPSIS)
500.0000 mL | Freq: Once | INTRAVENOUS | Status: AC
Start: 2015-05-02 — End: 2015-05-02
  Administered 2015-05-02: 500 mL via INTRAVENOUS

## 2015-05-02 MED ORDER — HYDROCODONE-ACETAMINOPHEN 5-325 MG PO TABS
1.0000 | ORAL_TABLET | ORAL | Status: DC | PRN
  Administered 2015-05-02 – 2015-05-04 (×10): 2 via ORAL
  Filled 2015-05-02 (×10): qty 2

## 2015-05-02 MED ORDER — DILTIAZEM HCL ER COATED BEADS 180 MG PO CP24
360.0000 mg | ORAL_CAPSULE | Freq: Every day | ORAL | Status: DC
  Administered 2015-05-03 – 2015-05-07 (×4): 360 mg via ORAL
  Filled 2015-05-02 (×2): qty 2
  Filled 2015-05-02 (×2): qty 1
  Filled 2015-05-02: qty 2
  Filled 2015-05-02 (×2): qty 1
  Filled 2015-05-02 (×3): qty 2

## 2015-05-02 MED ORDER — HYDROMORPHONE HCL 1 MG/ML IJ SOLN
0.7500 mg | Freq: Once | INTRAMUSCULAR | Status: AC
  Administered 2015-05-02: 0.75 mg via INTRAVENOUS
  Filled 2015-05-02: qty 1

## 2015-05-02 MED ORDER — SODIUM CHLORIDE 0.9 % IV SOLN
INTRAVENOUS | Status: DC
  Administered 2015-05-02: 13:00:00 via INTRAVENOUS

## 2015-05-02 MED ORDER — HYDRALAZINE HCL 20 MG/ML IJ SOLN: 10.0000 mg | Freq: Three times a day (TID) | INTRAMUSCULAR | Status: DC | PRN

## 2015-05-02 MED ORDER — POLYETHYLENE GLYCOL 3350 17 G PO PACK: 17.0000 g | PACK | Freq: Every day | ORAL | Status: DC | PRN

## 2015-05-02 MED ORDER — SODIUM CHLORIDE 0.9 % IV SOLN
INTRAVENOUS | Status: AC
  Administered 2015-05-02 (×2): via INTRAVENOUS

## 2015-05-02 MED ORDER — ONDANSETRON HCL 4 MG/2ML IJ SOLN
4.0000 mg | Freq: Once | INTRAMUSCULAR | Status: AC
  Administered 2015-05-02: 4 mg via INTRAVENOUS
  Filled 2015-05-02: qty 2

## 2015-05-02 MED ORDER — IOPAMIDOL (ISOVUE-300) INJECTION 61%
INTRAVENOUS | Status: AC
  Administered 2015-05-02: 75 mL
  Filled 2015-05-02: qty 75

## 2015-05-02 MED ORDER — ENOXAPARIN SODIUM 30 MG/0.3ML ~~LOC~~ SOLN
30.0000 mg | SUBCUTANEOUS | Status: DC
  Administered 2015-05-03 – 2015-05-06 (×3): 30 mg via SUBCUTANEOUS
  Filled 2015-05-02 (×3): qty 0.3

## 2015-05-02 MED ORDER — BISACODYL 5 MG PO TBEC: 5.0000 mg | DELAYED_RELEASE_TABLET | Freq: Every day | ORAL | Status: DC | PRN

## 2015-05-02 MED ORDER — HYDROMORPHONE HCL 1 MG/ML IJ SOLN
0.5000 mg | Freq: Once | INTRAMUSCULAR | Status: AC
Start: 2015-05-02 — End: 2015-05-02
  Administered 2015-05-02: 0.5 mg via INTRAVENOUS
  Filled 2015-05-02: qty 1

## 2015-05-02 MED ORDER — ACETAMINOPHEN 650 MG RE SUPP: 650.0000 mg | Freq: Four times a day (QID) | RECTAL | Status: DC | PRN

## 2015-05-02 MED ORDER — GADOBENATE DIMEGLUMINE 529 MG/ML IV SOLN
15.0000 mL | Freq: Once | INTRAVENOUS | Status: AC | PRN
Start: 2015-05-02 — End: 2015-05-02
  Administered 2015-05-02: 15 mL via INTRAVENOUS

## 2015-05-02 MED ORDER — HYDROMORPHONE HCL 1 MG/ML IJ SOLN
1.0000 mg | INTRAMUSCULAR | Status: DC | PRN
  Administered 2015-05-02: 1 mg via INTRAVENOUS
  Filled 2015-05-02: qty 1

## 2015-05-02 MED ORDER — SODIUM CHLORIDE 0.9 % IV BOLUS (SEPSIS)
1000.0000 mL | Freq: Once | INTRAVENOUS | Status: AC
  Administered 2015-05-02: 1000 mL via INTRAVENOUS

## 2015-05-02 MED ORDER — ONDANSETRON HCL 4 MG/2ML IJ SOLN: 4.0000 mg | Freq: Four times a day (QID) | INTRAMUSCULAR | Status: DC | PRN

## 2015-05-02 MED ORDER — ACETAMINOPHEN 325 MG PO TABS
650.0000 mg | ORAL_TABLET | Freq: Four times a day (QID) | ORAL | Status: DC | PRN
  Administered 2015-05-06: 650 mg via ORAL
  Filled 2015-05-02: qty 2

## 2015-05-02 MED ORDER — ZOLPIDEM TARTRATE 5 MG PO TABS
5.0000 mg | ORAL_TABLET | Freq: Every evening | ORAL | Status: DC | PRN
  Administered 2015-05-02 – 2015-05-06 (×5): 5 mg via ORAL
  Filled 2015-05-02 (×5): qty 1

## 2015-05-02 MED ORDER — PIPERACILLIN-TAZOBACTAM 3.375 G IVPB 30 MIN
3.3750 g | Freq: Once | INTRAVENOUS | Status: AC
  Administered 2015-05-02: 3.375 g via INTRAVENOUS
  Filled 2015-05-02: qty 50

## 2015-05-02 MED ORDER — ONDANSETRON HCL 4 MG PO TABS: 4.0000 mg | ORAL_TABLET | Freq: Four times a day (QID) | ORAL | Status: DC | PRN

## 2015-05-02 NOTE — ED Notes (Signed)
Pt had a ruptured ulcer approx 6 months ago and was taken to emergency surgery. Pt states she is having similar pain today in her abd. She has been nauseated and vomiting. Pain 10/10.

## 2015-05-02 NOTE — ED Notes (Signed)
Attempted IV. Unable to access. Second RN to attempt

## 2015-05-02 NOTE — Consult Note (Signed)
Reason for Consult: gallstone pancreatitis, choledocholithiasis, abnormal LFTs Referring Physician: Dr. Virgel Manifold   HPI: Sally Douglas is a 70 year old female with a history of exploratory laparotomy repair of gastric ulcers 09/2014 by Dr. Grandville Silos, laparoscopic hernia repair 2012 by Dr. Johney Maine, HTN, RLL lung nodule who presents with abdominal pain.  Duration of symptoms is 3-4 weeks, typically last about 4 hours and resolved.  Location is epigastric and without radiation.  This episode started at 0200 associated with nausea and vomiting.  Denies fever, chills or sweats.  Modifying factors include; pepto bismol and PPI.  No aggravating or alleviating factors.  Reports appetite has been poor and endorses to 80lb weight loss since her surgery in September.  She has been seen by Dr. Benson Norway for the weight loss.  Followed by Dr. Halford Chessman for the pulmonary nodule which is felt to be stable and low suspicion for malignancy.    ED work up reveals, WBC 13.2k.  sCr 1.22.  Lipase 2251, AST/ALT 253/141, total bilirubin 2.5.   CT of abdomen and pelvis reveals distended gallbladder, gallstones, CBD dilated at 36m.  We have been asked to evaluate for gallstone pancreatitis.   Past Medical History  Diagnosis Date  . Insomnia     takes Ambien nightly  . Obesity   . Menopausal syndrome   . Tobacco abuse   . Low back pain   . Bronchitis     hx of-many yrs ago  . Abnormal finding on Pap smear     hx abnl pap  . Hypertension     takes Diltiazem and HCTZ daily  . Constipation     takes Miralax daily  . Arthritis   . Joint pain   . Joint swelling     Past Surgical History  Procedure Laterality Date  . Tonsillectomy    . Dilation and curettage of uterus    . Ventral hernia repair  01/24/2011    Procedure: LAPAROSCOPIC VENTRAL HERNIA;  Surgeon: SAdin Hector MD;  Location: [facility];  Service: General;  Laterality: N/A;  with Mesh  . Maximum access (mas)posterior lumbar interbody fusion (plif) 1 level N/A  11/04/2012    Procedure: Lumbar four-five Maximum access posterior lumbar interbody fusion with Nuvasive;  Surgeon: DEustace Moore MD;  [facility];  Service: Neurosurgery;  Laterality: N/A;  Lumbar four-five Maximum access posterior lumbar interbody fusion with Nuvasive  . Laparotomy N/A 10/16/2014    Procedure: EXPLORATORY LAPAROTOMY, REPAIR OF GASTRIC ULCER;  Surgeon: BGeorganna Skeans MD;  Location: [facility];  Service: General;  Laterality: N/A;    Family History  Problem Relation Age of Onset  . Cancer Brother     renal ,also sister  . COPD Brother   . Hypertension Brother   . Diabetes Brother   . Kidney disease Brother   . Diabetes Brother     and sister  . Breast cancer      sister  . Kidney disease    . Cancer Mother     Breast Cancer  . Diabetes Mother   . Hypertension Mother   . Glaucoma Mother   . Diabetes Sister   . Hypertension Sister   . Heart disease Sister     Social History:  reports that she quit smoking about 6 months ago. Her smoking use included Cigarettes. She has a 6.25 pack-year smoking history. She has never used smokeless tobacco. She reports that she does not drink alcohol or use illicit drugs.  Allergies:  Allergies  Allergen Reactions  . K-Dur [Potassium Chloride] Swelling    Face Swelling  . Nsaids     H/o PUD with perforation    Medications:  Scheduled Meds:  Continuous Infusions: . sodium chloride 75 mL/hr at 05/02/15 1257   PRN Meds:.   Results for orders placed or performed during the hospital encounter of 05/02/15 (from the past 48 hour(s))  Urinalysis, Routine w reflex microscopic (not at [facility])     Status: None   Collection Time: 05/02/15  8:10 AM  Result Value Ref Range   Color, Urine YELLOW YELLOW   APPearance CLEAR CLEAR   Specific Gravity, Urine 1.012 1.005 - 1.030   pH 7.5 5.0 - 8.0   Glucose, UA NEGATIVE NEGATIVE mg/dL   Hgb urine dipstick NEGATIVE NEGATIVE   Bilirubin Urine NEGATIVE NEGATIVE   Ketones, ur  NEGATIVE NEGATIVE mg/dL   Protein, ur NEGATIVE NEGATIVE mg/dL   Nitrite NEGATIVE NEGATIVE   Leukocytes, UA NEGATIVE NEGATIVE    Comment: MICROSCOPIC NOT DONE ON URINES WITH NEGATIVE PROTEIN, BLOOD, LEUKOCYTES, NITRITE, OR GLUCOSE <1000 mg/dL.  Comprehensive metabolic panel     Status: Abnormal   Collection Time: 05/02/15  8:27 AM  Result Value Ref Range   Sodium 140 135 - 145 mmol/L   Potassium 3.8 3.5 - 5.1 mmol/L   Chloride 103 101 - 111 mmol/L   CO2 21 (L) 22 - 32 mmol/L   Glucose, Bld 110 (H) 65 - 99 mg/dL   BUN 12 6 - 20 mg/dL   Creatinine, Ser 1.22 (H) 0.44 - 1.00 mg/dL   Calcium 9.9 8.9 - 10.3 mg/dL   Total Protein 7.9 6.5 - 8.1 g/dL   Albumin 4.2 3.5 - 5.0 g/dL   AST 253 (H) 15 - 41 U/L   ALT 141 (H) 14 - 54 U/L   Alkaline Phosphatase 307 (H) 38 - 126 U/L   Total Bilirubin 2.5 (H) 0.3 - 1.2 mg/dL   GFR calc non Af Amer 44 (L) >60 mL/min   GFR calc Af Amer 51 (L) >60 mL/min    Comment: (NOTE) The eGFR has been calculated using the CKD EPI equation. This calculation has not been validated in all clinical situations. eGFR's persistently <60 mL/min signify possible Chronic Kidney Disease.    Anion gap 16 (H) 5 - 15  Lipase, blood     Status: Abnormal   Collection Time: 05/02/15  8:27 AM  Result Value Ref Range   Lipase 2251 (H) 11 - 51 U/L    Comment: RESULTS CONFIRMED BY MANUAL DILUTION  CBC with Differential/Platelet     Status: Abnormal   Collection Time: 05/02/15  8:27 AM  Result Value Ref Range   WBC 13.2 (H) 4.0 - 10.5 K/uL   RBC 4.72 3.87 - 5.11 MIL/uL   Hemoglobin 13.7 12.0 - 15.0 g/dL   HCT 43.0 36.0 - 46.0 %   MCV 91.1 78.0 - 100.0 fL   MCH 29.0 26.0 - 34.0 pg   MCHC 31.9 30.0 - 36.0 g/dL   RDW 13.9 11.5 - 15.5 %   Platelets 322 150 - 400 K/uL   Neutrophils Relative % 77 %   Neutro Abs 10.3 (H) 1.7 - 7.7 K/uL   Lymphocytes Relative 16 %   Lymphs Abs 2.1 0.7 - 4.0 K/uL   Monocytes Relative 5 %   Monocytes Absolute 0.6 0.1 - 1.0 K/uL   Eosinophils  Relative 2 %   Eosinophils Absolute 0.2 0.0 - 0.7 K/uL  Basophils Relative 0 %   Basophils Absolute 0.0 0.0 - 0.1 K/uL    Ct Abdomen Pelvis W Contrast  05/02/2015  CLINICAL DATA:  Midline epigastric pain, nausea starting 2 a.m., history of perforated ulcer EXAM: CT ABDOMEN AND PELVIS WITH CONTRAST TECHNIQUE: Multidetector CT imaging of the abdomen and pelvis was performed using the standard protocol following bolus administration of intravenous contrast. CONTRAST:  75 cc ISOVUE-300 IOPAMIDOL (ISOVUE-300) INJECTION 61% COMPARISON:  10/23/2014 FINDINGS: Lower chest: Again noted 11 cm nodule in right lower lobe. Further correlation with PET scan is recommended to exclude malignancy. Hepatobiliary: Borderline cardiomegaly. There is a distended gallbladder. Again noted layering sludge and tiny gallstones within gallbladder. CBD measures 8 mm in diameter. Mild intrahepatic biliary ductal dilatation. A left hepatic lobe cyst has decreased in size measures 1.8 cm. Mild thickening of gallbladder wall up to 4 mm. Subtle mild stranding of pericholecystic fat. Clinical correlation is necessary to exclude early cholecystitis. Further correlation with gallbladder ultrasound is recommended. Pancreas: Mild atrophic pancreas without focal abnormality. Spleen: Enhanced spleen is unremarkable. Adrenals/Urinary Tract: Adrenal glands are unremarkable. Enhanced kidneys are symmetrical in size. No hydronephrosis or hydroureter. Again noted a cyst in midpole posterior aspect of the right kidney measures 3.2 cm stable in size in appearance from prior exam. Delayed renal images shows bilateral renal symmetrical excretion. Central parapelvic cysts lower pole of the left kidney are stable. The urinary bladder is unremarkable. Stomach/Bowel: No gastric outlet obstruction. Mild thickening of distal gastric wall. There is probable scarring lower anterior gastric wall. No perigastric fluid or free air the No small bowel obstruction. No  mesenteric fluid collection. Moderate stool noted within cecum. No pericecal inflammation. Normal appendix partially visualized in axial image 48. Moderate stool and gas noted in transverse colon. No evidence of colitis or diverticulitis. No distal colonic obstruction. Vascular/Lymphatic: Mild atherosclerotic calcifications of distal abdominal aorta and iliac arteries. No aortic aneurysm. No retroperitoneal or mesenteric adenopathy. Reproductive: Again noted calcified fibroid within dorsal aspect of the uterus stable from prior exam. No adnexal mass. Other: There is no ascites or free air. Post hernia repair changes noted noted anterior abdominal wall. There is a small supraumbilical midline ventral hernia containing fat axial image 31 measures 3 cm without evidence of acute complication. Tiny umbilical hernia containing fat without evidence of acute complication. Axial image 48 there is a small right anterior abdominal wall infraumbilical region ventral hernia containing fat measures about 2.7 cm. No evidence of acute complication. Musculoskeletal: Again noted degenerative changes lumbar spine. Stable lumbar fusion at L4-L5 level. Degenerative changes bilateral hip joints. Degenerative changes pubic symphysis. IMPRESSION: 1. Again noted 11 mm nodule in right lower lobe. Further correlation with PET scan is recommended to exclude malignancy. 2. There is a distended gallbladder with mild enhancement and thickening of gallbladder wall. Minimal stranding of pericholecystic fat. Clinical correlation is necessary to exclude early cholecystitis. Further correlation with gallbladder ultrasound is recommended. Again noted layering sludge and small gallstones within gallbladder. Mild intrahepatic biliary ductal dilatation. Mild CBD dilatation up to 8 mm. 3. Stable right renal cyst. Stable left parapelvic cysts. No hydronephrosis or hydroureter. 4. Mild thickening of distal gastric wall. Probable postsurgical changes lower  anterior gastric wall. No evidence of perigastric fluid or free air. 5. Moderate stool noted in right colon and transverse colon. No evidence of colonic obstruction. No colitis or diverticulitis. 6. No small bowel obstruction. 7. There are small abdominal wall ventral hernias as described above without evidence of acute complications. 8.  Again noted calcified fibroid within uterus. 9. Stable postsurgical changes lumbar spine. Degenerative changes lumbar spine. Electronically Signed   By: Lahoma Crocker M.D.   On: 05/02/2015 10:54    Review of Systems  Constitutional: Positive for weight loss and malaise/fatigue. Negative for fever, chills and diaphoresis.  Eyes: Negative for blurred vision, double vision, photophobia, pain, discharge and redness.  Respiratory: Negative for cough, hemoptysis, sputum production, shortness of breath and wheezing.   Cardiovascular: Negative for chest pain, palpitations, orthopnea, claudication, leg swelling and PND.  Gastrointestinal: Positive for heartburn, nausea, vomiting, abdominal pain and constipation. Negative for diarrhea, blood in stool and melena.  Genitourinary: Negative for dysuria, urgency, frequency, hematuria and flank pain.  Musculoskeletal: Negative for myalgias, back pain, joint pain and neck pain.  Neurological: Negative for dizziness, tingling, tremors, sensory change, speech change, focal weakness, seizures, loss of consciousness and weakness.   Blood pressure 141/96, pulse 83, temperature 97.8 F (36.6 C), temperature source Oral, resp. rate 16, SpO2 99 %. Physical Exam  Constitutional: She is oriented to person, place, and time. She appears well-developed and well-nourished. No distress.  Cardiovascular: Normal rate, normal heart sounds and intact distal pulses.  Exam reveals no gallop.   No murmur heard. Respiratory: Effort normal and breath sounds normal. No respiratory distress. She has no wheezes. She has no rales. She exhibits no tenderness.   GI: Soft. Bowel sounds are normal. She exhibits no distension.  Moderate ttp epigastrium and ruq, mild ttp luq.  Midline incision--healed, no open wounds Ventral hernia-soft and reducible.  Musculoskeletal: Normal range of motion. She exhibits no edema or tenderness.  Neurological: She is alert and oriented to person, place, and time.  Skin: Skin is warm and dry. No rash noted. She is not diaphoretic. No erythema. No pallor.  Psychiatric: She has a normal mood and affect. Her behavior is normal. Thought content normal.    Assessment/Plan: S/p exploratory laparotomy 09/2014 Dr. Grandville Silos for perforated gastric ulcer Laparoscopic ventral hernia repair with mesh 2012  Gallstone pancreatitis  Abnormal LFTs Recommend medical admission Repeat LFTs and lipase in AM. Bowel rest, IV hydration and pain control.  Cholecystectomy once pancreatitis resolves this admission.  She will need cardiac clearance prior to surgery.  Does not need antibiotics from a surgical standpoint  HTN-on cardizem  Non ischemic CMP-normal LV function.  Followed Dr. Irish Lack RLL lung nodule-followed by Dr. Halford Chessman.  Due for repeat CT chest 08/2015.   NPO-NPO, IVF VTE prophylaxis-may have heparin/lovenox  Mayer Vondrak ANP-BC 05/02/2015, 1:05 PM

## 2015-05-02 NOTE — ED Notes (Signed)
Report given to nicole rn

## 2015-05-02 NOTE — ED Provider Notes (Signed)
CSN: [id number]     Arrival date & time 05/02/15  0719 History   First MD Initiated Contact with Patient 05/02/15 [phone]     Chief Complaint  Patient presents with  . Abdominal Pain     (Consider location/radiation/quality/duration/timing/severity/associated sxs/prior Treatment) HPI   Sally Douglas is a 70 year old female with a history of exploratory laparotomy repair of gastric ulcers 09/2014, laparoscopic hernia repair 2012 by Dr. Johney Maine, HTN, RLL lung nodule who presents with abdominal pain. Duration of symptoms is 3-4 weeks, typically last about 4 hours and resolved. Location is epigastric and without radiation. This episode started early this morning. Associated with nausea and vomiting.Very trying hospitalization in September and very concerned about possibility of more surgery. Denies fever, chills or sweats. Modifying factors include; pepto bismol and PPI. No aggravating or alleviating factors. Reports appetite has been poor and endorses to 80lb weight loss since her surgery in September. She has been seen by Dr. Benson Norway for the weight loss.  Past Medical History  Diagnosis Date  . Insomnia     takes Ambien nightly  . Obesity   . Menopausal syndrome   . Tobacco abuse   . Low back pain   . Bronchitis     hx of-many yrs ago  . Abnormal finding on Pap smear     hx abnl pap  . Hypertension     takes Diltiazem and HCTZ daily  . Constipation     takes Miralax daily  . Arthritis   . Joint pain   . Joint swelling    Past Surgical History  Procedure Laterality Date  . Tonsillectomy    . Dilation and curettage of uterus    . Ventral hernia repair  01/24/2011    Procedure: LAPAROSCOPIC VENTRAL HERNIA;  Surgeon: Adin Hector, MD;  Location: [facility];  Service: General;  Laterality: N/A;  with Mesh  . Maximum access (mas)posterior lumbar interbody fusion (plif) 1 level N/A 11/04/2012    Procedure: Lumbar four-five Maximum access posterior lumbar interbody fusion with Nuvasive;   Surgeon: Eustace Moore, MD;  Location: [facility];  Service: Neurosurgery;  Laterality: N/A;  Lumbar four-five Maximum access posterior lumbar interbody fusion with Nuvasive  . Laparotomy N/A 10/16/2014    Procedure: EXPLORATORY LAPAROTOMY, REPAIR OF GASTRIC ULCER;  Surgeon: Georganna Skeans, MD;  Location: [facility];  Service: General;  Laterality: N/A;   Family History  Problem Relation Age of Onset  . Cancer Brother     renal ,also sister  . COPD Brother   . Hypertension Brother   . Diabetes Brother   . Kidney disease Brother   . Diabetes Brother     and sister  . Breast cancer      sister  . Kidney disease    . Cancer Mother     Breast Cancer  . Diabetes Mother   . Hypertension Mother   . Glaucoma Mother   . Diabetes Sister   . Hypertension Sister   . Heart disease Sister    Social History  Substance Use Topics  . Smoking status: Former Smoker -- 0.25 packs/day for 25 years    Types: Cigarettes    Quit date: 10/16/2014  . Smokeless tobacco: Never Used     Comment: Used Electronic Cigarettes until quit  . Alcohol Use: No   OB History    No data available     Review of Systems   All systems reviewed and negative, other than as noted in  HPI.  Allergies  K-dur and Nsaids  Home Medications   Prior to Admission medications   Medication Sig Start Date End Date Taking? Authorizing Provider  bifidobacterium infantis (ALIGN) capsule Take 1 capsule by mouth daily.   Yes Historical Provider, MD  diltiazem (CARDIZEM CD) 360 MG 24 hr capsule Take 360 mg by mouth daily. 02/17/15  Yes Historical Provider, MD  feeding supplement, ENSURE ENLIVE, (ENSURE ENLIVE) LIQD Take 237 mLs by mouth 2 (two) times daily between meals. 10/22/14  Yes Nat Christen, PA-C  furosemide (LASIX) 20 MG tablet Take 20 mg by mouth daily. 02/28/15  Yes Historical Provider, MD  HYDROcodone-acetaminophen (NORCO) 10-325 MG per tablet Take 0.5-2 tablets by mouth every 6 (six) hours as needed for moderate pain or  severe pain (Prescribed by Dr. Maryjean Ka). 10/22/14  Yes Nat Christen, PA-C  omeprazole (PRILOSEC) 40 MG capsule Take 40 mg by mouth daily. 02/17/15  Yes Historical Provider, MD  polyethylene glycol (MIRALAX / GLYCOLAX) packet Take 17 g by mouth daily as needed for mild constipation.    Yes Historical Provider, MD  VOLTAREN 1 % GEL Apply 1 application topically daily as needed. FOR HANDS 09/17/14  Yes Historical Provider, MD  zolpidem (AMBIEN) 10 MG tablet Take 1 tablet (10 mg total) by mouth at bedtime as needed for sleep. 04/27/15  Yes Marletta Lor, MD  hydrOXYzine (ATARAX/VISTARIL) 10 MG tablet Take 1 tablet (10 mg total) by mouth 3 (three) times daily as needed. Patient not taking: Reported on 05/02/2015 04/12/15   Dorothyann Peng, NP   BP 141/96 mmHg  Pulse 83  Temp(Src) 97.8 F (36.6 C) (Oral)  Resp 16  SpO2 99% Physical Exam  Constitutional: She appears well-developed and well-nourished. No distress.  HENT:  Head: Normocephalic and atraumatic.  Eyes: Conjunctivae are normal. Right eye exhibits no discharge. Left eye exhibits no discharge.  Neck: Neck supple.  Cardiovascular: Normal rate, regular rhythm and normal heart sounds.  Exam reveals no gallop and no friction rub.   No murmur heard. Pulmonary/Chest: Effort normal and breath sounds normal. No respiratory distress.  Abdominal: Soft. She exhibits no distension. There is tenderness. There is no rebound and no guarding.  Soft but diffuse tenderness, worse in upper abdomen. Mild distension? Well healed surgical scars.   Musculoskeletal: She exhibits no edema or tenderness.  Neurological: She is alert.  Skin: Skin is warm and dry.  Psychiatric: She has a normal mood and affect. Her behavior is normal. Thought content normal.  Nursing note and vitals reviewed.   ED Course  Procedures (including critical care time) Labs Review Labs Reviewed  COMPREHENSIVE METABOLIC PANEL - Abnormal; Notable for the following:    CO2 21 (*)     Glucose, Bld 110 (*)    Creatinine, Ser 1.22 (*)    AST 253 (*)    ALT 141 (*)    Alkaline Phosphatase 307 (*)    Total Bilirubin 2.5 (*)    GFR calc non Af Amer 44 (*)    GFR calc Af Amer 51 (*)    Anion gap 16 (*)    All other components within normal limits  LIPASE, BLOOD - Abnormal; Notable for the following:    Lipase 2251 (*)    All other components within normal limits  CBC WITH DIFFERENTIAL/PLATELET - Abnormal; Notable for the following:    WBC 13.2 (*)    Neutro Abs 10.3 (*)    All other components within normal limits  URINALYSIS, ROUTINE W  REFLEX MICROSCOPIC (NOT AT [facility])    Imaging Review Ct Abdomen Pelvis W Contrast  05/02/2015  CLINICAL DATA:  Midline epigastric pain, nausea starting 2 a.m., history of perforated ulcer EXAM: CT ABDOMEN AND PELVIS WITH CONTRAST TECHNIQUE: Multidetector CT imaging of the abdomen and pelvis was performed using the standard protocol following bolus administration of intravenous contrast. CONTRAST:  75 cc ISOVUE-300 IOPAMIDOL (ISOVUE-300) INJECTION 61% COMPARISON:  10/23/2014 FINDINGS: Lower chest: Again noted 11 cm nodule in right lower lobe. Further correlation with PET scan is recommended to exclude malignancy. Hepatobiliary: Borderline cardiomegaly. There is a distended gallbladder. Again noted layering sludge and tiny gallstones within gallbladder. CBD measures 8 mm in diameter. Mild intrahepatic biliary ductal dilatation. A left hepatic lobe cyst has decreased in size measures 1.8 cm. Mild thickening of gallbladder wall up to 4 mm. Subtle mild stranding of pericholecystic fat. Clinical correlation is necessary to exclude early cholecystitis. Further correlation with gallbladder ultrasound is recommended. Pancreas: Mild atrophic pancreas without focal abnormality. Spleen: Enhanced spleen is unremarkable. Adrenals/Urinary Tract: Adrenal glands are unremarkable. Enhanced kidneys are symmetrical in size. No hydronephrosis or hydroureter. Again noted a  cyst in midpole posterior aspect of the right kidney measures 3.2 cm stable in size in appearance from prior exam. Delayed renal images shows bilateral renal symmetrical excretion. Central parapelvic cysts lower pole of the left kidney are stable. The urinary bladder is unremarkable. Stomach/Bowel: No gastric outlet obstruction. Mild thickening of distal gastric wall. There is probable scarring lower anterior gastric wall. No perigastric fluid or free air the No small bowel obstruction. No mesenteric fluid collection. Moderate stool noted within cecum. No pericecal inflammation. Normal appendix partially visualized in axial image 48. Moderate stool and gas noted in transverse colon. No evidence of colitis or diverticulitis. No distal colonic obstruction. Vascular/Lymphatic: Mild atherosclerotic calcifications of distal abdominal aorta and iliac arteries. No aortic aneurysm. No retroperitoneal or mesenteric adenopathy. Reproductive: Again noted calcified fibroid within dorsal aspect of the uterus stable from prior exam. No adnexal mass. Other: There is no ascites or free air. Post hernia repair changes noted noted anterior abdominal wall. There is a small supraumbilical midline ventral hernia containing fat axial image 31 measures 3 cm without evidence of acute complication. Tiny umbilical hernia containing fat without evidence of acute complication. Axial image 48 there is a small right anterior abdominal wall infraumbilical region ventral hernia containing fat measures about 2.7 cm. No evidence of acute complication. Musculoskeletal: Again noted degenerative changes lumbar spine. Stable lumbar fusion at L4-L5 level. Degenerative changes bilateral hip joints. Degenerative changes pubic symphysis. IMPRESSION: 1. Again noted 11 mm nodule in right lower lobe. Further correlation with PET scan is recommended to exclude malignancy. 2. There is a distended gallbladder with mild enhancement and thickening of gallbladder  wall. Minimal stranding of pericholecystic fat. Clinical correlation is necessary to exclude early cholecystitis. Further correlation with gallbladder ultrasound is recommended. Again noted layering sludge and small gallstones within gallbladder. Mild intrahepatic biliary ductal dilatation. Mild CBD dilatation up to 8 mm. 3. Stable right renal cyst. Stable left parapelvic cysts. No hydronephrosis or hydroureter. 4. Mild thickening of distal gastric wall. Probable postsurgical changes lower anterior gastric wall. No evidence of perigastric fluid or free air. 5. Moderate stool noted in right colon and transverse colon. No evidence of colonic obstruction. No colitis or diverticulitis. 6. No small bowel obstruction. 7. There are small abdominal wall ventral hernias as described above without evidence of acute complications. 8. Again noted calcified fibroid within uterus.  9. Stable postsurgical changes lumbar spine. Degenerative changes lumbar spine. Electronically Signed   By: Lahoma Crocker M.D.   On: 05/02/2015 10:54   US Abdomen Limited Ruq  05/02/2015  CLINICAL DATA:  Epigastric pain today EXAM: US ABDOMEN LIMITED - RIGHT UPPER QUADRANT COMPARISON:  CT earlier today. FINDINGS: Gallbladder: Multiple gallstones. Gallbladder is distended. Mild wall thickening at 5 mm. Negative Murphy sign. Largest gallstone is 0.5 cm. Common bile duct: Diameter: 7 mm. Liver: Normal echogenicity. No focal solid mass. 8 mm benign appearing cyst in the left lobe. 1.6 x 1.3 x 2.0 cm benign appearing cyst in the left lobe corresponds to the CT abnormality noted earlier today. IMPRESSION: Cholelithiasis.  There is nonspecific gallbladder wall thickening. Common bile duct is borderline dilated at 7 mm. Correlate with liver function tests as for the need for further imaging such as MRCP toe evaluate for biliary dilatation Benign appearing liver cysts. Electronically Signed   By: Marybelle Killings M.D.   On: 05/02/2015 13:44   I have personally  reviewed and evaluated these images and lab results as part of my medical decision-making.   EKG Interpretation None      MDM   Final diagnoses:  Abdominal pain  Cholecystitis  Acute pancreatitis, unspecified pancreatitis type    70yF with abdominal pain and n/v. Imaging significant for dilated GB with stones and wall thickening. CBD mildly dilated. LFTs abnormal and lipase 2000. Discussed with GI, Dr Benson Norway,  who will see in consultation.  Has leukocytosis, but is afebrile. Abx started for possible cholecystitis. Discussed with surgery. Can see in consultation for possible cholecystectomy but requesting medicine admission. Medicine would prefer to consult at well. Respective services to discuss among themselves as to who will admit.     Virgel Manifold, MD 05/08/15 1426

## 2015-05-02 NOTE — ED Notes (Signed)
Patient transported to Ultrasound 

## 2015-05-02 NOTE — H&P (Signed)
[facility] History and Physical  Sally Douglas [id number] DOB: 10/17/1945 DOA: 05/02/2015  Referring physician: Emergency Department PCP: Nyoka Cowden, MD   CHIEF COMPLAINT:    Abdominal pain               HPI: Sally Douglas is a 70 y.o. female in ED with a three week history of diffuse, non-radiating upper abdominal pain. Pain intermittent, unrelated to eating, position or bowel movements.  Some associated nausea. Lipase in 2000's. Gallstones and mild bilary duct dilation. Patient is s/p exploratory laparotomy and repair of perforated gastric ulcer September 2016. She been taking NSAIDs at the time. Takes a daily NSAID.      ED workup:   Lipase 2251, creatinine 1.22 (baseline 1.03). Elevated LFTs, mixed picture. White count 13. HCT 43%.  Hemodynamically stable. Initially hypertensive, blood pressure improving. Patient is afebrile  Urine - unremarkable           Medications  0.9 %  sodium chloride infusion ( Intravenous New Bag/Given 05/02/15 1257)  HYDROmorphone (DILAUDID) injection 0.75 mg (0.75 mg Intravenous Given 05/02/15 0831)  ondansetron (ZOFRAN) injection 4 mg (4 mg Intravenous Given 05/02/15 0831)  sodium chloride 0.9 % bolus 1,000 mL (0 mLs Intravenous Stopped 05/02/15 0953)  iopamidol (ISOVUE-300) 61 % injection (75 mLs  Contrast Given 05/02/15 1030)  piperacillin-tazobactam (ZOSYN) IVPB 3.375 g (0 g Intravenous Stopped 05/02/15 1210)  HYDROmorphone (DILAUDID) injection 0.5 mg (0.5 mg Intravenous Given 05/02/15 1134)   Review of Systems  Constitutional: Negative.   HENT: Negative.   Eyes: Negative.   Respiratory: Negative.   Cardiovascular: Negative.   Gastrointestinal: Positive for nausea and abdominal pain.  Genitourinary: Negative.   Musculoskeletal: Negative.   Skin: Negative.   Neurological: Negative.   Endo/Heme/Allergies: Negative.   Psychiatric/Behavioral: Negative.     Past Medical History  Diagnosis Date  . Insomnia     takes Ambien nightly    . Obesity   . Menopausal syndrome   . Tobacco abuse   . Low back pain   . Bronchitis     hx of-many yrs ago  . Abnormal finding on Pap smear     hx abnl pap  . Hypertension     takes Diltiazem and HCTZ daily  . Constipation     takes Miralax daily  . Arthritis   . Joint pain   . Joint swelling    Past Surgical History  Procedure Laterality Date  . Tonsillectomy    . Dilation and curettage of uterus    . Ventral hernia repair  01/24/2011    Procedure: LAPAROSCOPIC VENTRAL HERNIA;  Surgeon: Adin Hector, MD;  Location: [facility];  Service: General;  Laterality: N/A;  with Mesh  . Maximum access (mas)posterior lumbar interbody fusion (plif) 1 level N/A 11/04/2012    Procedure: Lumbar four-five Maximum access posterior lumbar interbody fusion with Nuvasive;  Surgeon: Eustace Moore, MD;  Location: [facility];  Service: Neurosurgery;  Laterality: N/A;  Lumbar four-five Maximum access posterior lumbar interbody fusion with Nuvasive  . Laparotomy N/A 10/16/2014    Procedure: EXPLORATORY LAPAROTOMY, REPAIR OF GASTRIC ULCER;  Surgeon: Georganna Skeans, MD;  Location: [facility];  Service: General;  Laterality: N/A;    SOCIAL HISTORY:  reports that she quit smoking about 6 months ago. Her smoking use included Cigarettes. She has a 6.25 pack-year smoking history. She has never used smokeless tobacco. She reports that she does not drink alcohol or use illicit drugs. Lives:  At home with husband  Assistive devices:   None needed for ambulation at home but uses a cane when out.   Allergies  Allergen Reactions  . K-Dur [Potassium Chloride] Swelling    Face Swelling  . Nsaids     H/o PUD with perforation    Family History  Problem Relation Age of Onset  . Cancer Brother     renal ,also sister  . COPD Brother   . Hypertension Brother   . Diabetes Brother   . Kidney disease Brother   . Diabetes Brother     and sister  . Breast cancer      sister  . Kidney disease    . Cancer Mother      Breast Cancer  . Diabetes Mother   . Hypertension Mother   . Glaucoma Mother   . Diabetes Sister   . Hypertension Sister   . Heart disease Sister     Prior to Admission medications   Medication Sig Start Date End Date Taking? Authorizing Provider  bifidobacterium infantis (ALIGN) capsule Take 1 capsule by mouth daily.   Yes Historical Provider, MD  diltiazem (CARDIZEM CD) 360 MG 24 hr capsule Take 360 mg by mouth daily. 02/17/15  Yes Historical Provider, MD  feeding supplement, ENSURE ENLIVE, (ENSURE ENLIVE) LIQD Take 237 mLs by mouth 2 (two) times daily between meals. 10/22/14  Yes Nat Christen, PA-C  furosemide (LASIX) 20 MG tablet Take 20 mg by mouth daily. 02/28/15  Yes Historical Provider, MD  HYDROcodone-acetaminophen (NORCO) 10-325 MG per tablet Take 0.5-2 tablets by mouth every 6 (six) hours as needed for moderate pain or severe pain (Prescribed by Dr. Maryjean Ka). 10/22/14  Yes Nat Christen, PA-C  omeprazole (PRILOSEC) 40 MG capsule Take 40 mg by mouth daily. 02/17/15  Yes Historical Provider, MD  polyethylene glycol (MIRALAX / GLYCOLAX) packet Take 17 g by mouth daily as needed for mild constipation.    Yes Historical Provider, MD  VOLTAREN 1 % GEL Apply 1 application topically daily as needed. FOR HANDS 09/17/14  Yes Historical Provider, MD  zolpidem (AMBIEN) 10 MG tablet Take 1 tablet (10 mg total) by mouth at bedtime as needed for sleep. 04/27/15  Yes Marletta Lor, MD  hydrOXYzine (ATARAX/VISTARIL) 10 MG tablet Take 1 tablet (10 mg total) by mouth 3 (three) times daily as needed. Patient not taking: Reported on 05/02/2015 04/12/15   Dorothyann Peng, NP   PHYSICAL EXAM: Filed Vitals:   05/02/15 1230 05/02/15 1245 05/02/15 1415 05/02/15 1430  BP: 157/90 141/96 164/90 178/87  Pulse: 73 83 58 61  Temp:      TempSrc:      Resp:      SpO2: 99% 99% 96% 98%    Wt Readings from Last 3 Encounters:  04/12/15 61.054 kg (134 lb 9.6 oz)  03/23/15 61.871 kg (136 lb 6.4 oz)  03/16/15  62.596 kg (138 lb)    General:  Pleasant Obese female. Appears calm and comfortable Eyes: PER, normal lids, irises & conjunctiva ENT: grossly normal hearing, lips & tongue Neck: no LAD, no masses Cardiovascular: RRR, no murmurs. No LE edema.  Respiratory: Respirations even and unlabored. Normal respiratory effort. Lungs CTA bilaterally, no wheezes / rales .   Abdomen: soft, non-distended, mild diffuse upper abdominal tenderness , a few bowel sounds . No obvious masses.  Skin: no rash seen on limited exam Musculoskeletal: grossly normal tone BUE/BLE Psychiatric: grossly normal mood and affect, speech fluent and appropriate Neurologic: grossly  non-focal.         LABS ON ADMISSION:    Basic Metabolic Panel:  Recent Labs Lab 05/02/15 0827  NA 140  K 3.8  CL 103  CO2 21*  GLUCOSE 110*  BUN 12  CREATININE 1.22*  CALCIUM 9.9   Liver Function Tests:  Recent Labs Lab 05/02/15 0827  AST 253*  ALT 141*  ALKPHOS 307*  BILITOT 2.5*  PROT 7.9  ALBUMIN 4.2    CBC:  Recent Labs Lab 05/02/15 0827  WBC 13.2*  NEUTROABS 10.3*  HGB 13.7  HCT 43.0  MCV 91.1  PLT 322    Creatinine clearance cannot be calculated (Unknown ideal weight.)  Radiological Exams on Admission: Ct Abdomen Pelvis W Contrast  05/02/2015  CLINICAL DATA:  Midline epigastric pain, nausea starting 2 a.m., history of perforated ulcer EXAM: CT ABDOMEN AND PELVIS WITH CONTRAST TECHNIQUE: Multidetector CT imaging of the abdomen and pelvis was performed using the standard protocol following bolus administration of intravenous contrast. CONTRAST:  75 cc ISOVUE-300 IOPAMIDOL (ISOVUE-300) INJECTION 61% COMPARISON:  10/23/2014 FINDINGS: Lower chest: Again noted 11 cm nodule in right lower lobe. Further correlation with PET scan is recommended to exclude malignancy. Hepatobiliary: Borderline cardiomegaly. There is a distended gallbladder. Again noted layering sludge and tiny gallstones within gallbladder. CBD measures 8  mm in diameter. Mild intrahepatic biliary ductal dilatation. A left hepatic lobe cyst has decreased in size measures 1.8 cm. Mild thickening of gallbladder wall up to 4 mm. Subtle mild stranding of pericholecystic fat. Clinical correlation is necessary to exclude early cholecystitis. Further correlation with gallbladder ultrasound is recommended. Pancreas: Mild atrophic pancreas without focal abnormality. Spleen: Enhanced spleen is unremarkable. Adrenals/Urinary Tract: Adrenal glands are unremarkable. Enhanced kidneys are symmetrical in size. No hydronephrosis or hydroureter. Again noted a cyst in midpole posterior aspect of the right kidney measures 3.2 cm stable in size in appearance from prior exam. Delayed renal images shows bilateral renal symmetrical excretion. Central parapelvic cysts lower pole of the left kidney are stable. The urinary bladder is unremarkable. Stomach/Bowel: No gastric outlet obstruction. Mild thickening of distal gastric wall. There is probable scarring lower anterior gastric wall. No perigastric fluid or free air the No small bowel obstruction. No mesenteric fluid collection. Moderate stool noted within cecum. No pericecal inflammation. Normal appendix partially visualized in axial image 48. Moderate stool and gas noted in transverse colon. No evidence of colitis or diverticulitis. No distal colonic obstruction. Vascular/Lymphatic: Mild atherosclerotic calcifications of distal abdominal aorta and iliac arteries. No aortic aneurysm. No retroperitoneal or mesenteric adenopathy. Reproductive: Again noted calcified fibroid within dorsal aspect of the uterus stable from prior exam. No adnexal mass. Other: There is no ascites or free air. Post hernia repair changes noted noted anterior abdominal wall. There is a small supraumbilical midline ventral hernia containing fat axial image 31 measures 3 cm without evidence of acute complication. Tiny umbilical hernia containing fat without evidence of  acute complication. Axial image 48 there is a small right anterior abdominal wall infraumbilical region ventral hernia containing fat measures about 2.7 cm. No evidence of acute complication. Musculoskeletal: Again noted degenerative changes lumbar spine. Stable lumbar fusion at L4-L5 level. Degenerative changes bilateral hip joints. Degenerative changes pubic symphysis. IMPRESSION: 1. Again noted 11 mm nodule in right lower lobe. Further correlation with PET scan is recommended to exclude malignancy. 2. There is a distended gallbladder with mild enhancement and thickening of gallbladder wall. Minimal stranding of pericholecystic fat. Clinical correlation is necessary to exclude early  cholecystitis. Further correlation with gallbladder ultrasound is recommended. Again noted layering sludge and small gallstones within gallbladder. Mild intrahepatic biliary ductal dilatation. Mild CBD dilatation up to 8 mm. 3. Stable right renal cyst. Stable left parapelvic cysts. No hydronephrosis or hydroureter. 4. Mild thickening of distal gastric wall. Probable postsurgical changes lower anterior gastric wall. No evidence of perigastric fluid or free air. 5. Moderate stool noted in right colon and transverse colon. No evidence of colonic obstruction. No colitis or diverticulitis. 6. No small bowel obstruction. 7. There are small abdominal wall ventral hernias as described above without evidence of acute complications. 8. Again noted calcified fibroid within uterus. 9. Stable postsurgical changes lumbar spine. Degenerative changes lumbar spine. Electronically Signed   By: Lahoma Crocker M.D.   On: 05/02/2015 10:54   US Abdomen Limited Ruq  05/02/2015  CLINICAL DATA:  Epigastric pain today EXAM: US ABDOMEN LIMITED - RIGHT UPPER QUADRANT COMPARISON:  CT earlier today. FINDINGS: Gallbladder: Multiple gallstones. Gallbladder is distended. Mild wall thickening at 5 mm. Negative Murphy sign. Largest gallstone is 0.5 cm. Common bile duct:  Diameter: 7 mm. Liver: Normal echogenicity. No focal solid mass. 8 mm benign appearing cyst in the left lobe. 1.6 x 1.3 x 2.0 cm benign appearing cyst in the left lobe corresponds to the CT abnormality noted earlier today. IMPRESSION: Cholelithiasis.  There is nonspecific gallbladder wall thickening. Common bile duct is borderline dilated at 7 mm. Correlate with liver function tests as for the need for further imaging such as MRCP toe evaluate for biliary dilatation Benign appearing liver cysts. Electronically Signed   By: Marybelle Killings M.D.   On: 05/02/2015 13:44     ASSESSMENT / PLAN   Active Problems:   Essential hypertension   Nonischemic cardiomyopathy (HCC)   Pulmonary nodule   Acute gallstone pancreatitis   Pancreatitis   Gallstone pancreaititis.  -admit to Medical Bed - IV fluid resuscitation. Received one liter bolus in ED, now getting 75 ml /hr.She has a     history of grade I diastolic heart failure on echo Oct 2016 but ideally patient needs more  IVF. Will give an additional 500cc bolus and increase IVF to 125 ml/hr .  -NPO except for meds, analgesics -Gastroenterology Consult placed by EDP to Dr. Benson Norway. Patient doesn't want to see Dr.   Benson Norway so we will order MRCP given CTscan findings of mild biliary duct dilation. Obtain am   LFTs  Patient may have passed a stone. If there is evidence for a retained stone on MRCP  then will ask a different GI group to see patient regarding an ERCP.  -Surgery has already evaluated and plans for cholecystectomy this admission.   Cholecystitis, ? Early based on CTscan findings. Spoke with Surgery who doesn't feel patient has cholecystitis so will stop the Zosyn which was started in ED.   HTN. Well controlled at home on 1 medication per patient. Patient says she gets hypertensive when hospitalized -Continue home Cardizem -Add hydralazine when necessary  Hx of nonischemic cardiomyopathy / grade I diastolic heart failure. Surgery would like cardiac  clearance prior to surgery.  -Call placed to Cardiology. They will see her in the am.   Hx of perforated gastric ulcer Sept 2016 - NSAID use at the time   Pulmonary nodule, 88mm. Evaluated by [facility] 03/23/15. Plan is for follow-up CT chest in August    CONSULTANTS:   General Surgery- Will need cholecystectomy this admission  Cardiology for surgical clearance. Will see in  am  Code Status: full code DVT Prophylaxis: Lovenox  Family Communication:  Patient alert, oriented and understands plan of care.  Disposition Plan: Discharge to home in 3-4 days   Time spent: 60 minutes Tye Savoy  NP [facility] Pager [phone]

## 2015-05-02 NOTE — ED Notes (Signed)
MD at bedside. 

## 2015-05-02 NOTE — ED Notes (Signed)
Patient transported to CT 

## 2015-05-02 NOTE — ED Notes (Signed)
Pt transported to MRI, Sally Douglas asked to take pt to 57M after MRI is completed.

## 2015-05-02 NOTE — Progress Notes (Signed)
Patient arrived to 5M01 AAOx4, vitals taken, family at the bedside. Call bell by her side. Will continue to monitor. Lilee Aldea, Rande Brunt, RN

## 2015-05-03 DIAGNOSIS — I10 Essential (primary) hypertension: Secondary | ICD-10-CM

## 2015-05-03 DIAGNOSIS — K851 Biliary acute pancreatitis without necrosis or infection: Principal | ICD-10-CM

## 2015-05-03 DIAGNOSIS — Z01810 Encounter for preprocedural cardiovascular examination: Secondary | ICD-10-CM

## 2015-05-03 DIAGNOSIS — K819 Cholecystitis, unspecified: Secondary | ICD-10-CM

## 2015-05-03 DIAGNOSIS — I429 Cardiomyopathy, unspecified: Secondary | ICD-10-CM

## 2015-05-03 LAB — COMPREHENSIVE METABOLIC PANEL
ALBUMIN: 2.9 g/dL — AB (ref 3.5–5.0)
ALT: 238 U/L — AB (ref 14–54)
AST: 235 U/L — AB (ref 15–41)
Alkaline Phosphatase: 312 U/L — ABNORMAL HIGH (ref 38–126)
Anion gap: 10 (ref 5–15)
BILIRUBIN TOTAL: 1.4 mg/dL — AB (ref 0.3–1.2)
BUN: 11 mg/dL (ref 6–20)
CHLORIDE: 106 mmol/L (ref 101–111)
CO2: 27 mmol/L (ref 22–32)
CREATININE: 1 mg/dL (ref 0.44–1.00)
Calcium: 9 mg/dL (ref 8.9–10.3)
GFR calc Af Amer: >60 mL/min (ref >60)
GFR, EST NON AFRICAN AMERICAN: 56 mL/min — AB (ref >60)
GLUCOSE: 87 mg/dL (ref 65–99)
POTASSIUM: 3.1 mmol/L — AB (ref 3.5–5.1)
Sodium: 143 mmol/L (ref 135–145)
TOTAL PROTEIN: 5.9 g/dL — AB (ref 6.5–8.1)

## 2015-05-03 LAB — CBC
HEMATOCRIT: 39.4 % (ref 36.0–46.0)
Hemoglobin: 12.5 g/dL (ref 12.0–15.0)
MCH: 28.9 pg (ref 26.0–34.0)
MCHC: 31.7 g/dL (ref 30.0–36.0)
MCV: 91.2 fL (ref 78.0–100.0)
PLATELETS: 313 10*3/uL (ref 150–400)
RBC: 4.32 MIL/uL (ref 3.87–5.11)
RDW: 13.9 % (ref 11.5–15.5)
WBC: 9.7 10*3/uL (ref 4.0–10.5)

## 2015-05-03 LAB — LIPASE, BLOOD: Lipase: 135 U/L — ABNORMAL HIGH (ref 11–51)

## 2015-05-03 MED ORDER — BOOST / RESOURCE BREEZE PO LIQD
1.0000 | Freq: Three times a day (TID) | ORAL | Status: DC
  Administered 2015-05-04 – 2015-05-07 (×5): 1 via ORAL
  Filled 2015-05-03 (×15): qty 1

## 2015-05-03 NOTE — Progress Notes (Signed)
Pt provided resource breeze

## 2015-05-03 NOTE — Progress Notes (Signed)
[facility] PROGRESS NOTE  Sally Douglas [id number] DOB: 11-19-1945 DOA: 05/02/2015  PCP: Nyoka Cowden, MD  Brief HPI: 70 year old Caucasian female with a past medical history of hypertension, perforated gastric ulcer, tobacco abuse, presented with abdominal pain and was found to have pancreatitis. She was also noted to have cholelithiasis. She was hospitalized for further management.  Past medical history:  Past Medical History  Diagnosis Date  . Insomnia     takes Ambien nightly  . Obesity   . Menopausal syndrome   . Tobacco abuse   . Low back pain   . Bronchitis     hx of-many yrs ago  . Abnormal finding on Pap smear     hx abnl pap  . Hypertension     takes Diltiazem and HCTZ daily  . Constipation     takes Miralax daily  . Arthritis   . Joint pain   . Joint swelling   . Perforated gastric ulcer ([place])     Consultants: Gen. surgery. Cardiology.  Procedures:  None yet  Antibiotics: None  Subjective: Patient states that her right-sided abdominal pain has improved. Now down to 4 out of 10 in intensity. Denies any nausea or vomiting. Feels hungry. Denies any chest pain or shortness of breath.  Objective: Vital Signs  Filed Vitals:   05/03/15 0145 05/03/15 0553 05/03/15 0630 05/03/15 0926  BP: 161/81  124/31 132/73  Pulse: 69  69 80  Temp: 98.3 F (36.8 C)  98.4 F (36.9 C) 98.1 F (36.7 C)  TempSrc: Oral  Oral Oral  Resp: 17  16 16   Height:      Weight:  59.3 kg (130 lb 11.7 oz)    SpO2: 99%  97% 98%   No intake or output data in the 24 hours ending 05/03/15 1255 Filed Weights   05/02/15 2001 05/03/15 0553  Weight: 59.1 kg (130 lb 4.7 oz) 59.3 kg (130 lb 11.7 oz)    General appearance: alert, cooperative and no distress Resp: clear to auscultation bilaterally Cardio: regular rate and rhythm, S1, S2 normal, no murmur, click, rub or gallop GI: Emily. Tender in the epigastric area without any rebound, rigidity, guarding. No masses  or organomegaly. Heart sounds are present. Extremities: extremities normal, atraumatic, no cyanosis or edema Neurology: Awake and alert. Oriented 3. No focal neurological deficits.  Lab Results:  Basic Metabolic Panel:  Recent Labs Lab 05/02/15 0827 05/03/15 0210  NA 140 143  K 3.8 3.1*  CL 103 106  CO2 21* 27  GLUCOSE 110* 87  BUN 12 11  CREATININE 1.22* 1.00  CALCIUM 9.9 9.0   Liver Function Tests:  Recent Labs Lab 05/02/15 0827 05/03/15 0210  AST 253* 235*  ALT 141* 238*  ALKPHOS 307* 312*  BILITOT 2.5* 1.4*  PROT 7.9 5.9*  ALBUMIN 4.2 2.9*    Recent Labs Lab 05/02/15 0827 05/03/15 0210  LIPASE 2251* 135*   CBC:  Recent Labs Lab 05/02/15 0827 05/03/15 0210  WBC 13.2* 9.7  NEUTROABS 10.3*  --   HGB 13.7 12.5  HCT 43.0 39.4  MCV 91.1 91.2  PLT 322 313    Studies/Results: Ct Abdomen Pelvis W Contrast  05/02/2015  CLINICAL DATA:  Midline epigastric pain, nausea starting 2 a.m., history of perforated ulcer EXAM: CT ABDOMEN AND PELVIS WITH CONTRAST TECHNIQUE: Multidetector CT imaging of the abdomen and pelvis was performed using the standard protocol following bolus administration of intravenous contrast. CONTRAST:  75 cc ISOVUE-300 IOPAMIDOL (ISOVUE-300) INJECTION 61%  COMPARISON:  10/23/2014 FINDINGS: Lower chest: Again noted 11 cm nodule in right lower lobe. Further correlation with PET scan is recommended to exclude malignancy. Hepatobiliary: Borderline cardiomegaly. There is a distended gallbladder. Again noted layering sludge and tiny gallstones within gallbladder. CBD measures 8 mm in diameter. Mild intrahepatic biliary ductal dilatation. A left hepatic lobe cyst has decreased in size measures 1.8 cm. Mild thickening of gallbladder wall up to 4 mm. Subtle mild stranding of pericholecystic fat. Clinical correlation is necessary to exclude early cholecystitis. Further correlation with gallbladder ultrasound is recommended. Pancreas: Mild atrophic pancreas  without focal abnormality. Spleen: Enhanced spleen is unremarkable. Adrenals/Urinary Tract: Adrenal glands are unremarkable. Enhanced kidneys are symmetrical in size. No hydronephrosis or hydroureter. Again noted a cyst in midpole posterior aspect of the right kidney measures 3.2 cm stable in size in appearance from prior exam. Delayed renal images shows bilateral renal symmetrical excretion. Central parapelvic cysts lower pole of the left kidney are stable. The urinary bladder is unremarkable. Stomach/Bowel: No gastric outlet obstruction. Mild thickening of distal gastric wall. There is probable scarring lower anterior gastric wall. No perigastric fluid or free air the No small bowel obstruction. No mesenteric fluid collection. Moderate stool noted within cecum. No pericecal inflammation. Normal appendix partially visualized in axial image 48. Moderate stool and gas noted in transverse colon. No evidence of colitis or diverticulitis. No distal colonic obstruction. Vascular/Lymphatic: Mild atherosclerotic calcifications of distal abdominal aorta and iliac arteries. No aortic aneurysm. No retroperitoneal or mesenteric adenopathy. Reproductive: Again noted calcified fibroid within dorsal aspect of the uterus stable from prior exam. No adnexal mass. Other: There is no ascites or free air. Post hernia repair changes noted noted anterior abdominal wall. There is a small supraumbilical midline ventral hernia containing fat axial image 31 measures 3 cm without evidence of acute complication. Tiny umbilical hernia containing fat without evidence of acute complication. Axial image 48 there is a small right anterior abdominal wall infraumbilical region ventral hernia containing fat measures about 2.7 cm. No evidence of acute complication. Musculoskeletal: Again noted degenerative changes lumbar spine. Stable lumbar fusion at L4-L5 level. Degenerative changes bilateral hip joints. Degenerative changes pubic symphysis.  IMPRESSION: 1. Again noted 11 mm nodule in right lower lobe. Further correlation with PET scan is recommended to exclude malignancy. 2. There is a distended gallbladder with mild enhancement and thickening of gallbladder wall. Minimal stranding of pericholecystic fat. Clinical correlation is necessary to exclude early cholecystitis. Further correlation with gallbladder ultrasound is recommended. Again noted layering sludge and small gallstones within gallbladder. Mild intrahepatic biliary ductal dilatation. Mild CBD dilatation up to 8 mm. 3. Stable right renal cyst. Stable left parapelvic cysts. No hydronephrosis or hydroureter. 4. Mild thickening of distal gastric wall. Probable postsurgical changes lower anterior gastric wall. No evidence of perigastric fluid or free air. 5. Moderate stool noted in right colon and transverse colon. No evidence of colonic obstruction. No colitis or diverticulitis. 6. No small bowel obstruction. 7. There are small abdominal wall ventral hernias as described above without evidence of acute complications. 8. Again noted calcified fibroid within uterus. 9. Stable postsurgical changes lumbar spine. Degenerative changes lumbar spine. Electronically Signed   By: Lahoma Crocker M.D.   On: 05/02/2015 10:54   Mr 3d Recon At Scanner  05/03/2015  CLINICAL DATA:  Three-week history of diffuse upper abdominal pain. Abnormal ultrasound and CT scans. EXAM: MRI ABDOMEN WITHOUT AND WITH CONTRAST (INCLUDING MRCP) TECHNIQUE: Multiplanar multisequence MR imaging of the abdomen was performed both  before and after the administration of intravenous contrast. Heavily T2-weighted images of the biliary and pancreatic ducts were obtained, and three-dimensional MRCP images were rendered by post processing. CONTRAST:  29mL MULTIHANCE GADOBENATE DIMEGLUMINE 529 MG/ML IV SOLN COMPARISON:  Ultrasound 05/02/2015 and CT scan 05/02/2015. FINDINGS: Lower chest: The lung bases are clear of acute process. No pleural or  pericardial effusion. Right lower lobe pulmonary nodule is again noted. Hepatobiliary: Simple hepatic cyst noted near the gallbladder in segment 4B. Other small scattered cysts are noted. No worrisome hepatic lesions or perihepatic fluid collections. Mild intrahepatic biliary dilatation. There are numerous gallstones in the gallbladder along with pericholecystic fluid and gallbladder wall thickening suggesting acute cholecystitis. The common bile duct measures 9 mm. 2 or 3 small common bile duct stones are seen distally. Pancreas: No mass, inflammation or ductal dilatation. Spleen: Normal size.  No focal lesions. Adrenals/Urinary Tract: The adrenal glands are unremarkable. There is a simple right renal cyst. No worrisome renal lesions or hydronephrosis. Stomach/Bowel: The stomach, duodenum, visualized small bowel and visualized colon are unremarkable. Vascular/Lymphatic: The aorta and branch vessels are patent. The major venous structures are patent. No mesenteric or retroperitoneal mass or adenopathy. Other: Anterior abdominal wall hernia containing omental fat. Possible surrounding prior surgical changes with enhancing granulation tissue. No ascites. Spinal fusion hardware noted. Musculoskeletal: No significant bony findings. IMPRESSION: 1. Distended gallbladder with numerous small layering gallstones. Gallbladder wall thickening and pericholecystic fluid suggesting acute cholecystitis. There is a 3.5 mm calculus in the gallbladder neck/cystic duct region. There are also 2 or 3 small stones in the distal common bile duct. Mild common bile duct dilatation at 9 mm. 2. Normal caliber and course of the main pancreatic duct. A 4 mm pancreatic body cyst is noted. 3. Small hepatic cysts. No worrisome hepatic lesions. Mild intrahepatic biliary dilatation. 4. Right renal cyst. 5. Small anterior abdominal wall hernia. Electronically Signed   By: Marijo Sanes M.D.   On: 05/03/2015 08:28   Mr Abd W/wo Cm/mrcp  05/03/2015   CLINICAL DATA:  Three-week history of diffuse upper abdominal pain. Abnormal ultrasound and CT scans. EXAM: MRI ABDOMEN WITHOUT AND WITH CONTRAST (INCLUDING MRCP) TECHNIQUE: Multiplanar multisequence MR imaging of the abdomen was performed both before and after the administration of intravenous contrast. Heavily T2-weighted images of the biliary and pancreatic ducts were obtained, and three-dimensional MRCP images were rendered by post processing. CONTRAST:  57mL MULTIHANCE GADOBENATE DIMEGLUMINE 529 MG/ML IV SOLN COMPARISON:  Ultrasound 05/02/2015 and CT scan 05/02/2015. FINDINGS: Lower chest: The lung bases are clear of acute process. No pleural or pericardial effusion. Right lower lobe pulmonary nodule is again noted. Hepatobiliary: Simple hepatic cyst noted near the gallbladder in segment 4B. Other small scattered cysts are noted. No worrisome hepatic lesions or perihepatic fluid collections. Mild intrahepatic biliary dilatation. There are numerous gallstones in the gallbladder along with pericholecystic fluid and gallbladder wall thickening suggesting acute cholecystitis. The common bile duct measures 9 mm. 2 or 3 small common bile duct stones are seen distally. Pancreas: No mass, inflammation or ductal dilatation. Spleen: Normal size.  No focal lesions. Adrenals/Urinary Tract: The adrenal glands are unremarkable. There is a simple right renal cyst. No worrisome renal lesions or hydronephrosis. Stomach/Bowel: The stomach, duodenum, visualized small bowel and visualized colon are unremarkable. Vascular/Lymphatic: The aorta and branch vessels are patent. The major venous structures are patent. No mesenteric or retroperitoneal mass or adenopathy. Other: Anterior abdominal wall hernia containing omental fat. Possible surrounding prior surgical changes with enhancing granulation  tissue. No ascites. Spinal fusion hardware noted. Musculoskeletal: No significant bony findings. IMPRESSION: 1. Distended gallbladder with  numerous small layering gallstones. Gallbladder wall thickening and pericholecystic fluid suggesting acute cholecystitis. There is a 3.5 mm calculus in the gallbladder neck/cystic duct region. There are also 2 or 3 small stones in the distal common bile duct. Mild common bile duct dilatation at 9 mm. 2. Normal caliber and course of the main pancreatic duct. A 4 mm pancreatic body cyst is noted. 3. Small hepatic cysts. No worrisome hepatic lesions. Mild intrahepatic biliary dilatation. 4. Right renal cyst. 5. Small anterior abdominal wall hernia. Electronically Signed   By: Marijo Sanes M.D.   On: 05/03/2015 08:28   US Abdomen Limited Ruq  05/02/2015  CLINICAL DATA:  Epigastric pain today EXAM: US ABDOMEN LIMITED - RIGHT UPPER QUADRANT COMPARISON:  CT earlier today. FINDINGS: Gallbladder: Multiple gallstones. Gallbladder is distended. Mild wall thickening at 5 mm. Negative Murphy sign. Largest gallstone is 0.5 cm. Common bile duct: Diameter: 7 mm. Liver: Normal echogenicity. No focal solid mass. 8 mm benign appearing cyst in the left lobe. 1.6 x 1.3 x 2.0 cm benign appearing cyst in the left lobe corresponds to the CT abnormality noted earlier today. IMPRESSION: Cholelithiasis.  There is nonspecific gallbladder wall thickening. Common bile duct is borderline dilated at 7 mm. Correlate with liver function tests as for the need for further imaging such as MRCP toe evaluate for biliary dilatation Benign appearing liver cysts. Electronically Signed   By: Marybelle Killings M.D.   On: 05/02/2015 13:44    Medications:  Scheduled: . diltiazem  360 mg Oral Daily  . enoxaparin (LOVENOX) injection  30 mg Subcutaneous Q24H  . pantoprazole  40 mg Oral Daily   Continuous: . sodium chloride 125 mL/hr at 05/02/15 2247   [id number] **OR** acetaminophen, bisacodyl, hydrALAZINE, HYDROcodone-acetaminophen, HYDROmorphone (DILAUDID) injection, ondansetron **OR** ondansetron (ZOFRAN) IV, polyethylene glycol, traZODone,  zolpidem  Assessment/Plan:  Active Problems:   Essential hypertension   Nonischemic cardiomyopathy (HCC)   Pulmonary nodule   Acute gallstone pancreatitis   Pancreatitis    Acute Gallstone pancreatitis  Patient remains stable. She is feeling better. Lipase is improved. Continue to monitor clinically. Symptomatic treatment.  Cholelithiasis with Cholecystitis and biliary ductal dilatation MRCP reviewed. Concern for cholecystitis. General surgery is following and plan is for surgical intervention tomorrow. I did discuss with Dr. Donne Hazel regarding small bile duct stones noted on the MRCP. He states that plan will be to proceed with cholecystectomy and he will perform cholangiogram intraoperatively. No need to involve gastroenterology at this time. Antibiotics per surgery.  Essential HTN Well controlled at home on one medication per patient. Patient says she gets hypertensive when hospitalized. Continue home Cardizem. Add hydralazine when necessary  Hx of nonischemic cardiomyopathy / grade I diastolic heart failure Surgery would like cardiac clearance prior to surgery. Await cardiology input.  Hx of perforated gastric ulcer Sept 2016 Stable  Pulmonary nodule, 55mm Evaluated by [facility] 03/23/15. Plan is for follow-up CT chest in August.   DVT Prophylaxis: Lovenox    Code Status: Full code  Family Communication: Discussed with the patient and her husband  Disposition Plan: Await surgery tomorrow.    LOS: 1 day   [facility] Pager [phone] 05/03/2015, 12:55 PM  If 7PM-7AM, please contact night-coverage at [email or web address], password [facility]

## 2015-05-03 NOTE — Consult Note (Signed)
Patient ID: Sally Douglas MRN: [id number], DOB/AGE: 1945/07/20   Admit date: 05/02/2015   Primary Physician: Nyoka Cowden, MD Primary Cardiologist: Dr. Irish Lack  Pt. Profile:  70 y/o female with h/o Takotsubo's Cardiomyopathy in 09/2014 in the setting of a perforated bowel requiring emergency surgery. F/u echo 10/2014 showed resolution of previous wall motion abnormalities and normal EF of 55-60%. She has now been readmitted for gallstone pancreatitis. She has been seen by general surgery and plans are for likely cholecystectomy once her pancreatitis resolves. Given her cardiac history, cardiology consultation has been requested for surgical clearance. She has no other cardiac history.    Problem List  Past Medical History  Diagnosis Date  . Insomnia     takes Ambien nightly  . Obesity   . Menopausal syndrome   . Tobacco abuse   . Low back pain   . Bronchitis     hx of-many yrs ago  . Abnormal finding on Pap smear     hx abnl pap  . Hypertension     takes Diltiazem and HCTZ daily  . Constipation     takes Miralax daily  . Arthritis   . Joint pain   . Joint swelling   . Perforated gastric ulcer ([place])     Past Surgical History  Procedure Laterality Date  . Tonsillectomy    . Dilation and curettage of uterus    . Ventral hernia repair  01/24/2011    Procedure: LAPAROSCOPIC VENTRAL HERNIA;  Surgeon: Adin Hector, MD;  Location: [facility];  Service: General;  Laterality: N/A;  with Mesh  . Maximum access (mas)posterior lumbar interbody fusion (plif) 1 level N/A 11/04/2012    Procedure: Lumbar four-five Maximum access posterior lumbar interbody fusion with Nuvasive;  Surgeon: Eustace Moore, MD;  Location: [facility];  Service: Neurosurgery;  Laterality: N/A;  Lumbar four-five Maximum access posterior lumbar interbody fusion with Nuvasive  . Laparotomy N/A 10/16/2014    Procedure: EXPLORATORY LAPAROTOMY, REPAIR OF GASTRIC ULCER;  Surgeon: Georganna Skeans, MD;   Location: [facility];  Service: General;  Laterality: N/A;     Allergies  Allergies  Allergen Reactions  . K-Dur [Potassium Chloride] Swelling    Cannot tolerate Potassium in any prepared form. Causes Face Swelling  . Nsaids     H/o PUD with perforation    HPI  70 y/o female followed by Dr. Irish Lack. In September 2016, she was admitted for a perforated bowel and underwent emergency surgery. At that time, she had ECG changes which prompted an echocardiogram. There was apical hypokinesis and concern for LAD ischemia versus Takatsubo cardiomyopathy. Given she was quite ill, we elected not to perform cardiac catheterization at that time. She was started on medical therapy for heart failure including ACE inhibitor, beta blocker, hydralazine, nitrates and furosemide. F/u 2D echo 11/17/14 showed normal EF of 55-60% with grade 1 DD. Compared to her prior echo, her wall motion had returned to normal. This supported the likelihood that she had had Takotsubo's Cardiomyopathy that had recovered. She was seen by Dr. Irish Lack, in clinic, the same day as her f/u echo and no further cardiac w/u was planned as her echo had improved and she was w/o anginal symptoms and no s/s of heart failure. He advised that she f/u PRN.  She has now been readmitted, this time for gallstone pancreatitis. She has been seen by general surgery and plans are for likely cholecystectomy once her pancreatitis resolves. Given her cardiac history, cardiology consultation  has been requested for surgical clearance.  Since her last OV with Dr. Irish Lack, she reports that she has done well. No anginal symptomatology. She reports she can, "get out in the yard and work like a man". No exertional symptoms or limitations. No s/s of heart failure, including no dyspnea, orthopnea, PND or LEE. She also denies palpitations, dizziness, syncope/ near syncope.   No EKG has been performed this admission.   Home Medications  Prior to Admission medications    Medication Sig Start Date End Date Taking? Authorizing Provider  bifidobacterium infantis (ALIGN) capsule Take 1 capsule by mouth daily.   Yes Historical Provider, MD  diltiazem (CARDIZEM CD) 360 MG 24 hr capsule Take 360 mg by mouth daily. 02/17/15  Yes Historical Provider, MD  feeding supplement, ENSURE ENLIVE, (ENSURE ENLIVE) LIQD Take 237 mLs by mouth 2 (two) times daily between meals. 10/22/14  Yes Nat Christen, PA-C  furosemide (LASIX) 20 MG tablet Take 20 mg by mouth daily. 02/28/15  Yes Historical Provider, MD  HYDROcodone-acetaminophen (NORCO) 10-325 MG per tablet Take 0.5-2 tablets by mouth every 6 (six) hours as needed for moderate pain or severe pain (Prescribed by Dr. Maryjean Ka). 10/22/14  Yes Nat Christen, PA-C  omeprazole (PRILOSEC) 40 MG capsule Take 40 mg by mouth daily. 02/17/15  Yes Historical Provider, MD  polyethylene glycol (MIRALAX / GLYCOLAX) packet Take 17 g by mouth daily as needed for mild constipation.    Yes Historical Provider, MD  VOLTAREN 1 % GEL Apply 1 application topically daily as needed. FOR HANDS 09/17/14  Yes Historical Provider, MD  zolpidem (AMBIEN) 10 MG tablet Take 1 tablet (10 mg total) by mouth at bedtime as needed for sleep. 04/27/15  Yes Marletta Lor, MD  hydrOXYzine (ATARAX/VISTARIL) 10 MG tablet Take 1 tablet (10 mg total) by mouth 3 (three) times daily as needed. Patient not taking: Reported on 05/02/2015 04/12/15   Dorothyann Peng, NP    Family History  Family History  Problem Relation Age of Onset  . Cancer Brother     renal ,also sister  . COPD Brother   . Hypertension Brother   . Diabetes Brother   . Kidney disease Brother   . Diabetes Brother     and sister  . Breast cancer      sister  . Kidney disease    . Cancer Mother     Breast Cancer  . Diabetes Mother   . Hypertension Mother   . Glaucoma Mother   . Diabetes Sister   . Hypertension Sister   . Heart disease Sister     Social History  Social History   Social History    . Marital Status: Married    Spouse Name: N/A  . Number of Children: N/A  . Years of Education: N/A   Occupational History  . retired    Social History Main Topics  . Smoking status: Former Smoker -- 0.25 packs/day for 25 years    Types: Cigarettes    Quit date: 10/16/2014  . Smokeless tobacco: Never Used     Comment: Used Electronic Cigarettes until quit  . Alcohol Use: No  . Drug Use: No  . Sexual Activity: Not Currently    Birth Control/ Protection: Post-menopausal   Other Topics Concern  . Not on file   Social History Narrative   gavidia 1   Para 1    aborta 0     Review of Systems General:  No chills, fever, night sweats  or weight changes.  Cardiovascular:  No chest pain, dyspnea on exertion, edema, orthopnea, palpitations, paroxysmal nocturnal dyspnea. Dermatological: No rash, lesions/masses Respiratory: No cough, dyspnea Urologic: No hematuria, dysuria Abdominal:   No nausea, vomiting, diarrhea, bright red blood per rectum, melena, or hematemesis Neurologic:  No visual changes, wkns, changes in mental status. All other systems reviewed and are otherwise negative except as noted above.  Physical Exam  Blood pressure 132/73, pulse 80, temperature 98.1 F (36.7 C), temperature source Oral, resp. rate 16, height 4\' 8"  (1.422 m), weight 130 lb 11.7 oz (59.3 kg), SpO2 98 %.  General: Pleasant, NAD Psych: Normal affect. Neuro: Alert and oriented X 3. Moves all extremities spontaneously. HEENT: Normal  Neck: Supple without bruits or JVD. Lungs:  Resp regular and unlabored, CTA. Heart: RRR no s3, s4, or murmurs. Abdomen: Soft, tender, non-distended, BS + x 4, prior surgical scar present on anterior abdomen Extremities: No clubbing, cyanosis or edema. DP/PT/Radials 2+ and equal bilaterally.  Labs  Troponin (Point of Care Test) No results for input(s): TROPIPOC in the last 72 hours. No results for input(s): CKTOTAL, CKMB, TROPONINI in the last 72 hours. Lab  Results  Component Value Date   WBC 9.7 05/03/2015   HGB 12.5 05/03/2015   HCT 39.4 05/03/2015   MCV 91.2 05/03/2015   PLT 313 05/03/2015    Recent Labs Lab 05/03/15 0210  NA 143  K 3.1*  CL 106  CO2 27  BUN 11  CREATININE 1.00  CALCIUM 9.0  PROT 5.9*  BILITOT 1.4*  ALKPHOS 312*  ALT 238*  AST 235*  GLUCOSE 87   Lab Results  Component Value Date   CHOL 158 10/21/2014   HDL 36* 10/21/2014   LDLCALC 87 10/21/2014   TRIG 175* 10/21/2014   No results found for: DDIMER   Radiology/Studies  Ct Abdomen Pelvis W Contrast  05/02/2015  CLINICAL DATA:  Midline epigastric pain, nausea starting 2 a.m., history of perforated ulcer EXAM: CT ABDOMEN AND PELVIS WITH CONTRAST TECHNIQUE: Multidetector CT imaging of the abdomen and pelvis was performed using the standard protocol following bolus administration of intravenous contrast. CONTRAST:  75 cc ISOVUE-300 IOPAMIDOL (ISOVUE-300) INJECTION 61% COMPARISON:  10/23/2014 FINDINGS: Lower chest: Again noted 11 cm nodule in right lower lobe. Further correlation with PET scan is recommended to exclude malignancy. Hepatobiliary: Borderline cardiomegaly. There is a distended gallbladder. Again noted layering sludge and tiny gallstones within gallbladder. CBD measures 8 mm in diameter. Mild intrahepatic biliary ductal dilatation. A left hepatic lobe cyst has decreased in size measures 1.8 cm. Mild thickening of gallbladder wall up to 4 mm. Subtle mild stranding of pericholecystic fat. Clinical correlation is necessary to exclude early cholecystitis. Further correlation with gallbladder ultrasound is recommended. Pancreas: Mild atrophic pancreas without focal abnormality. Spleen: Enhanced spleen is unremarkable. Adrenals/Urinary Tract: Adrenal glands are unremarkable. Enhanced kidneys are symmetrical in size. No hydronephrosis or hydroureter. Again noted a cyst in midpole posterior aspect of the right kidney measures 3.2 cm stable in size in appearance from  prior exam. Delayed renal images shows bilateral renal symmetrical excretion. Central parapelvic cysts lower pole of the left kidney are stable. The urinary bladder is unremarkable. Stomach/Bowel: No gastric outlet obstruction. Mild thickening of distal gastric wall. There is probable scarring lower anterior gastric wall. No perigastric fluid or free air the No small bowel obstruction. No mesenteric fluid collection. Moderate stool noted within cecum. No pericecal inflammation. Normal appendix partially visualized in axial image 48. Moderate stool and gas  noted in transverse colon. No evidence of colitis or diverticulitis. No distal colonic obstruction. Vascular/Lymphatic: Mild atherosclerotic calcifications of distal abdominal aorta and iliac arteries. No aortic aneurysm. No retroperitoneal or mesenteric adenopathy. Reproductive: Again noted calcified fibroid within dorsal aspect of the uterus stable from prior exam. No adnexal mass. Other: There is no ascites or free air. Post hernia repair changes noted noted anterior abdominal wall. There is a small supraumbilical midline ventral hernia containing fat axial image 31 measures 3 cm without evidence of acute complication. Tiny umbilical hernia containing fat without evidence of acute complication. Axial image 48 there is a small right anterior abdominal wall infraumbilical region ventral hernia containing fat measures about 2.7 cm. No evidence of acute complication. Musculoskeletal: Again noted degenerative changes lumbar spine. Stable lumbar fusion at L4-L5 level. Degenerative changes bilateral hip joints. Degenerative changes pubic symphysis. IMPRESSION: 1. Again noted 11 mm nodule in right lower lobe. Further correlation with PET scan is recommended to exclude malignancy. 2. There is a distended gallbladder with mild enhancement and thickening of gallbladder wall. Minimal stranding of pericholecystic fat. Clinical correlation is necessary to exclude early  cholecystitis. Further correlation with gallbladder ultrasound is recommended. Again noted layering sludge and small gallstones within gallbladder. Mild intrahepatic biliary ductal dilatation. Mild CBD dilatation up to 8 mm. 3. Stable right renal cyst. Stable left parapelvic cysts. No hydronephrosis or hydroureter. 4. Mild thickening of distal gastric wall. Probable postsurgical changes lower anterior gastric wall. No evidence of perigastric fluid or free air. 5. Moderate stool noted in right colon and transverse colon. No evidence of colonic obstruction. No colitis or diverticulitis. 6. No small bowel obstruction. 7. There are small abdominal wall ventral hernias as described above without evidence of acute complications. 8. Again noted calcified fibroid within uterus. 9. Stable postsurgical changes lumbar spine. Degenerative changes lumbar spine. Electronically Signed   By: Lahoma Crocker M.D.   On: 05/02/2015 10:54   Mr 3d Recon At Scanner  05/03/2015  CLINICAL DATA:  Three-week history of diffuse upper abdominal pain. Abnormal ultrasound and CT scans. EXAM: MRI ABDOMEN WITHOUT AND WITH CONTRAST (INCLUDING MRCP) TECHNIQUE: Multiplanar multisequence MR imaging of the abdomen was performed both before and after the administration of intravenous contrast. Heavily T2-weighted images of the biliary and pancreatic ducts were obtained, and three-dimensional MRCP images were rendered by post processing. CONTRAST:  69mL MULTIHANCE GADOBENATE DIMEGLUMINE 529 MG/ML IV SOLN COMPARISON:  Ultrasound 05/02/2015 and CT scan 05/02/2015. FINDINGS: Lower chest: The lung bases are clear of acute process. No pleural or pericardial effusion. Right lower lobe pulmonary nodule is again noted. Hepatobiliary: Simple hepatic cyst noted near the gallbladder in segment 4B. Other small scattered cysts are noted. No worrisome hepatic lesions or perihepatic fluid collections. Mild intrahepatic biliary dilatation. There are numerous gallstones in  the gallbladder along with pericholecystic fluid and gallbladder wall thickening suggesting acute cholecystitis. The common bile duct measures 9 mm. 2 or 3 small common bile duct stones are seen distally. Pancreas: No mass, inflammation or ductal dilatation. Spleen: Normal size.  No focal lesions. Adrenals/Urinary Tract: The adrenal glands are unremarkable. There is a simple right renal cyst. No worrisome renal lesions or hydronephrosis. Stomach/Bowel: The stomach, duodenum, visualized small bowel and visualized colon are unremarkable. Vascular/Lymphatic: The aorta and branch vessels are patent. The major venous structures are patent. No mesenteric or retroperitoneal mass or adenopathy. Other: Anterior abdominal wall hernia containing omental fat. Possible surrounding prior surgical changes with enhancing granulation tissue. No ascites. Spinal fusion hardware  noted. Musculoskeletal: No significant bony findings. IMPRESSION: 1. Distended gallbladder with numerous small layering gallstones. Gallbladder wall thickening and pericholecystic fluid suggesting acute cholecystitis. There is a 3.5 mm calculus in the gallbladder neck/cystic duct region. There are also 2 or 3 small stones in the distal common bile duct. Mild common bile duct dilatation at 9 mm. 2. Normal caliber and course of the main pancreatic duct. A 4 mm pancreatic body cyst is noted. 3. Small hepatic cysts. No worrisome hepatic lesions. Mild intrahepatic biliary dilatation. 4. Right renal cyst. 5. Small anterior abdominal wall hernia. Electronically Signed   By: Marijo Sanes M.D.   On: 05/03/2015 08:28   Mr Abd W/wo Cm/mrcp  05/03/2015  CLINICAL DATA:  Three-week history of diffuse upper abdominal pain. Abnormal ultrasound and CT scans. EXAM: MRI ABDOMEN WITHOUT AND WITH CONTRAST (INCLUDING MRCP) TECHNIQUE: Multiplanar multisequence MR imaging of the abdomen was performed both before and after the administration of intravenous contrast. Heavily  T2-weighted images of the biliary and pancreatic ducts were obtained, and three-dimensional MRCP images were rendered by post processing. CONTRAST:  63mL MULTIHANCE GADOBENATE DIMEGLUMINE 529 MG/ML IV SOLN COMPARISON:  Ultrasound 05/02/2015 and CT scan 05/02/2015. FINDINGS: Lower chest: The lung bases are clear of acute process. No pleural or pericardial effusion. Right lower lobe pulmonary nodule is again noted. Hepatobiliary: Simple hepatic cyst noted near the gallbladder in segment 4B. Other small scattered cysts are noted. No worrisome hepatic lesions or perihepatic fluid collections. Mild intrahepatic biliary dilatation. There are numerous gallstones in the gallbladder along with pericholecystic fluid and gallbladder wall thickening suggesting acute cholecystitis. The common bile duct measures 9 mm. 2 or 3 small common bile duct stones are seen distally. Pancreas: No mass, inflammation or ductal dilatation. Spleen: Normal size.  No focal lesions. Adrenals/Urinary Tract: The adrenal glands are unremarkable. There is a simple right renal cyst. No worrisome renal lesions or hydronephrosis. Stomach/Bowel: The stomach, duodenum, visualized small bowel and visualized colon are unremarkable. Vascular/Lymphatic: The aorta and branch vessels are patent. The major venous structures are patent. No mesenteric or retroperitoneal mass or adenopathy. Other: Anterior abdominal wall hernia containing omental fat. Possible surrounding prior surgical changes with enhancing granulation tissue. No ascites. Spinal fusion hardware noted. Musculoskeletal: No significant bony findings. IMPRESSION: 1. Distended gallbladder with numerous small layering gallstones. Gallbladder wall thickening and pericholecystic fluid suggesting acute cholecystitis. There is a 3.5 mm calculus in the gallbladder neck/cystic duct region. There are also 2 or 3 small stones in the distal common bile duct. Mild common bile duct dilatation at 9 mm. 2. Normal  caliber and course of the main pancreatic duct. A 4 mm pancreatic body cyst is noted. 3. Small hepatic cysts. No worrisome hepatic lesions. Mild intrahepatic biliary dilatation. 4. Right renal cyst. 5. Small anterior abdominal wall hernia. Electronically Signed   By: Marijo Sanes M.D.   On: 05/03/2015 08:28   US Abdomen Limited Ruq  05/02/2015  CLINICAL DATA:  Epigastric pain today EXAM: US ABDOMEN LIMITED - RIGHT UPPER QUADRANT COMPARISON:  CT earlier today. FINDINGS: Gallbladder: Multiple gallstones. Gallbladder is distended. Mild wall thickening at 5 mm. Negative Murphy sign. Largest gallstone is 0.5 cm. Common bile duct: Diameter: 7 mm. Liver: Normal echogenicity. No focal solid mass. 8 mm benign appearing cyst in the left lobe. 1.6 x 1.3 x 2.0 cm benign appearing cyst in the left lobe corresponds to the CT abnormality noted earlier today. IMPRESSION: Cholelithiasis.  There is nonspecific gallbladder wall thickening. Common bile duct is borderline  dilated at 7 mm. Correlate with liver function tests as for the need for further imaging such as MRCP toe evaluate for biliary dilatation Benign appearing liver cysts. Electronically Signed   By: Marybelle Killings M.D.   On: 05/02/2015 13:44    ECG  12 Lead - pending    ASSESSMENT AND PLAN  Active Problems:   Essential hypertension   Nonischemic cardiomyopathy (HCC)   Pulmonary nodule   Acute gallstone pancreatitis   Pancreatitis   1. Acute Gallstone Pancreatitis: management per IM and General Surgery, with plans for cholecystectomy once her pancreatitis resolves.  2. H/o Nonischemic Cardiomyopathy (Takotsubo's Cardiomyopathy): This was in the context of perforated bowel requiring emergency surgery. F/u echo 10/2014 showed resolution of previous wall motion abnormalities and normal EF of 55-60%. She denies any s/s of recurrent HF. Cardiac exam is benign.   3. Surgical Clearance: Patient is w/o anginal symptomatolgy and has no limitations with  exertional activities at home (performs frequent yard work). No s/s of HF. 2D echo in 10/2014 showed normal EF and wall motion. Her cardiac exam is benign. Given lack of symptoms, no indication for pre-op cardiac testing. At minimum, we will obtain a 12 lead EKG. If no ischemic abnormalities, she can be cleared for surgery. Dr. Irish Lack to see later today.   4. HTN: BP is currently controlled.   5. Hypokalemia: K is 3.1. Recommend repletion with supplemental K.   Signed, SIMMONS, Silas Flood, PA-C 05/03/2015, 10:20 AM  I have examined the patient and reviewed assessment and plan and discussed with patient.  Agree with above as stated.  She is physically active without cardiac sx.  No further testing needed before surgery, (routine ECG is pending now).  Last EF was normal.  She mowed her lawn a few weeks ago.  Please let us know if cardiac issues arise.   VARANASI,JAYADEEP S.

## 2015-05-03 NOTE — Care Management Note (Signed)
Case Management Note  Patient Details  Name: Sally Douglas MRN: [id number] Date of Birth: [birth year]  Subjective/Objective:                    Action/Plan: Patient was admitted with abdominal pain. Lives at home with spouse. Will follow for discharge needs pending patient progress and plan of care.  Expected Discharge Date:                  Expected Discharge Plan:     In-House Referral:     Discharge planning Services     Post Acute Care Choice:    Choice offered to:     DME Arranged:    DME Agency:     HH Arranged:    HH Agency:     Status of Service:  In process, will continue to follow  Medicare Important Message Given:    Date Medicare IM Given:    Medicare IM give by:    Date Additional Medicare IM Given:    Additional Medicare Important Message give by:     If discussed at [place] of Stay Meetings, dates discussed:    Additional CommentsRolm Baptise, RN 05/03/2015, 3:29 PM [phone]

## 2015-05-03 NOTE — Progress Notes (Signed)
Subjective: Feels better, no n/v, passing flatus, some residual pain  Objective: Vital signs in last 24 hours: Temp:  [98.3 F (36.8 C)-99 F (37.2 C)] 98.4 F (36.9 C) (04/05 0630) Pulse Rate:  [51-107] 69 (04/05 0630) Resp:  [16-18] 16 (04/05 0630) BP: (124-204)/(31-99) 124/31 mmHg (04/05 0630) SpO2:  [95 %-100 %] 97 % (04/05 0630) Weight:  [59.1 kg (130 lb 4.7 oz)-59.3 kg (130 lb 11.7 oz)] 59.3 kg (130 lb 11.7 oz) (04/05 0553)    Intake/Output from previous day:   Intake/Output this shift:    GI: mild epigastric pain, soft  Lab Results:   Recent Labs  05/02/15 0827 05/03/15 0210  WBC 13.2* 9.7  HGB 13.7 12.5  HCT 43.0 39.4  PLT 322 313   BMET  Recent Labs  05/02/15 0827 05/03/15 0210  NA 140 143  K 3.8 3.1*  CL 103 106  CO2 21* 27  GLUCOSE 110* 87  BUN 12 11  CREATININE 1.22* 1.00  CALCIUM 9.9 9.0   PT/INR No results for input(s): LABPROT, INR in the last 72 hours. ABG No results for input(s): PHART, HCO3 in the last 72 hours.  Invalid input(s): PCO2, PO2  Studies/Results: Ct Abdomen Pelvis W Contrast  05/02/2015  CLINICAL DATA:  Midline epigastric pain, nausea starting 2 a.m., history of perforated ulcer EXAM: CT ABDOMEN AND PELVIS WITH CONTRAST TECHNIQUE: Multidetector CT imaging of the abdomen and pelvis was performed using the standard protocol following bolus administration of intravenous contrast. CONTRAST:  75 cc ISOVUE-300 IOPAMIDOL (ISOVUE-300) INJECTION 61% COMPARISON:  10/23/2014 FINDINGS: Lower chest: Again noted 11 cm nodule in right lower lobe. Further correlation with PET scan is recommended to exclude malignancy. Hepatobiliary: Borderline cardiomegaly. There is a distended gallbladder. Again noted layering sludge and tiny gallstones within gallbladder. CBD measures 8 mm in diameter. Mild intrahepatic biliary ductal dilatation. A left hepatic lobe cyst has decreased in size measures 1.8 cm. Mild thickening of gallbladder wall up to 4 mm.  Subtle mild stranding of pericholecystic fat. Clinical correlation is necessary to exclude early cholecystitis. Further correlation with gallbladder ultrasound is recommended. Pancreas: Mild atrophic pancreas without focal abnormality. Spleen: Enhanced spleen is unremarkable. Adrenals/Urinary Tract: Adrenal glands are unremarkable. Enhanced kidneys are symmetrical in size. No hydronephrosis or hydroureter. Again noted a cyst in midpole posterior aspect of the right kidney measures 3.2 cm stable in size in appearance from prior exam. Delayed renal images shows bilateral renal symmetrical excretion. Central parapelvic cysts lower pole of the left kidney are stable. The urinary bladder is unremarkable. Stomach/Bowel: No gastric outlet obstruction. Mild thickening of distal gastric wall. There is probable scarring lower anterior gastric wall. No perigastric fluid or free air the No small bowel obstruction. No mesenteric fluid collection. Moderate stool noted within cecum. No pericecal inflammation. Normal appendix partially visualized in axial image 48. Moderate stool and gas noted in transverse colon. No evidence of colitis or diverticulitis. No distal colonic obstruction. Vascular/Lymphatic: Mild atherosclerotic calcifications of distal abdominal aorta and iliac arteries. No aortic aneurysm. No retroperitoneal or mesenteric adenopathy. Reproductive: Again noted calcified fibroid within dorsal aspect of the uterus stable from prior exam. No adnexal mass. Other: There is no ascites or free air. Post hernia repair changes noted noted anterior abdominal wall. There is a small supraumbilical midline ventral hernia containing fat axial image 31 measures 3 cm without evidence of acute complication. Tiny umbilical hernia containing fat without evidence of acute complication. Axial image 48 there is a small right anterior abdominal wall  infraumbilical region ventral hernia containing fat measures about 2.7 cm. No evidence of  acute complication. Musculoskeletal: Again noted degenerative changes lumbar spine. Stable lumbar fusion at L4-L5 level. Degenerative changes bilateral hip joints. Degenerative changes pubic symphysis. IMPRESSION: 1. Again noted 11 mm nodule in right lower lobe. Further correlation with PET scan is recommended to exclude malignancy. 2. There is a distended gallbladder with mild enhancement and thickening of gallbladder wall. Minimal stranding of pericholecystic fat. Clinical correlation is necessary to exclude early cholecystitis. Further correlation with gallbladder ultrasound is recommended. Again noted layering sludge and small gallstones within gallbladder. Mild intrahepatic biliary ductal dilatation. Mild CBD dilatation up to 8 mm. 3. Stable right renal cyst. Stable left parapelvic cysts. No hydronephrosis or hydroureter. 4. Mild thickening of distal gastric wall. Probable postsurgical changes lower anterior gastric wall. No evidence of perigastric fluid or free air. 5. Moderate stool noted in right colon and transverse colon. No evidence of colonic obstruction. No colitis or diverticulitis. 6. No small bowel obstruction. 7. There are small abdominal wall ventral hernias as described above without evidence of acute complications. 8. Again noted calcified fibroid within uterus. 9. Stable postsurgical changes lumbar spine. Degenerative changes lumbar spine. Electronically Signed   By: Lahoma Crocker M.D.   On: 05/02/2015 10:54   Mr 3d Recon At Scanner  05/03/2015  CLINICAL DATA:  Three-week history of diffuse upper abdominal pain. Abnormal ultrasound and CT scans. EXAM: MRI ABDOMEN WITHOUT AND WITH CONTRAST (INCLUDING MRCP) TECHNIQUE: Multiplanar multisequence MR imaging of the abdomen was performed both before and after the administration of intravenous contrast. Heavily T2-weighted images of the biliary and pancreatic ducts were obtained, and three-dimensional MRCP images were rendered by post processing.  CONTRAST:  88mL MULTIHANCE GADOBENATE DIMEGLUMINE 529 MG/ML IV SOLN COMPARISON:  Ultrasound 05/02/2015 and CT scan 05/02/2015. FINDINGS: Lower chest: The lung bases are clear of acute process. No pleural or pericardial effusion. Right lower lobe pulmonary nodule is again noted. Hepatobiliary: Simple hepatic cyst noted near the gallbladder in segment 4B. Other small scattered cysts are noted. No worrisome hepatic lesions or perihepatic fluid collections. Mild intrahepatic biliary dilatation. There are numerous gallstones in the gallbladder along with pericholecystic fluid and gallbladder wall thickening suggesting acute cholecystitis. The common bile duct measures 9 mm. 2 or 3 small common bile duct stones are seen distally. Pancreas: No mass, inflammation or ductal dilatation. Spleen: Normal size.  No focal lesions. Adrenals/Urinary Tract: The adrenal glands are unremarkable. There is a simple right renal cyst. No worrisome renal lesions or hydronephrosis. Stomach/Bowel: The stomach, duodenum, visualized small bowel and visualized colon are unremarkable. Vascular/Lymphatic: The aorta and branch vessels are patent. The major venous structures are patent. No mesenteric or retroperitoneal mass or adenopathy. Other: Anterior abdominal wall hernia containing omental fat. Possible surrounding prior surgical changes with enhancing granulation tissue. No ascites. Spinal fusion hardware noted. Musculoskeletal: No significant bony findings. IMPRESSION: 1. Distended gallbladder with numerous small layering gallstones. Gallbladder wall thickening and pericholecystic fluid suggesting acute cholecystitis. There is a 3.5 mm calculus in the gallbladder neck/cystic duct region. There are also 2 or 3 small stones in the distal common bile duct. Mild common bile duct dilatation at 9 mm. 2. Normal caliber and course of the main pancreatic duct. A 4 mm pancreatic body cyst is noted. 3. Small hepatic cysts. No worrisome hepatic lesions.  Mild intrahepatic biliary dilatation. 4. Right renal cyst. 5. Small anterior abdominal wall hernia. Electronically Signed   By: Ricky Stabs.D.  On: 05/03/2015 08:28   Mr Abd W/wo Cm/mrcp  05/03/2015  CLINICAL DATA:  Three-week history of diffuse upper abdominal pain. Abnormal ultrasound and CT scans. EXAM: MRI ABDOMEN WITHOUT AND WITH CONTRAST (INCLUDING MRCP) TECHNIQUE: Multiplanar multisequence MR imaging of the abdomen was performed both before and after the administration of intravenous contrast. Heavily T2-weighted images of the biliary and pancreatic ducts were obtained, and three-dimensional MRCP images were rendered by post processing. CONTRAST:  73mL MULTIHANCE GADOBENATE DIMEGLUMINE 529 MG/ML IV SOLN COMPARISON:  Ultrasound 05/02/2015 and CT scan 05/02/2015. FINDINGS: Lower chest: The lung bases are clear of acute process. No pleural or pericardial effusion. Right lower lobe pulmonary nodule is again noted. Hepatobiliary: Simple hepatic cyst noted near the gallbladder in segment 4B. Other small scattered cysts are noted. No worrisome hepatic lesions or perihepatic fluid collections. Mild intrahepatic biliary dilatation. There are numerous gallstones in the gallbladder along with pericholecystic fluid and gallbladder wall thickening suggesting acute cholecystitis. The common bile duct measures 9 mm. 2 or 3 small common bile duct stones are seen distally. Pancreas: No mass, inflammation or ductal dilatation. Spleen: Normal size.  No focal lesions. Adrenals/Urinary Tract: The adrenal glands are unremarkable. There is a simple right renal cyst. No worrisome renal lesions or hydronephrosis. Stomach/Bowel: The stomach, duodenum, visualized small bowel and visualized colon are unremarkable. Vascular/Lymphatic: The aorta and branch vessels are patent. The major venous structures are patent. No mesenteric or retroperitoneal mass or adenopathy. Other: Anterior abdominal wall hernia containing omental fat.  Possible surrounding prior surgical changes with enhancing granulation tissue. No ascites. Spinal fusion hardware noted. Musculoskeletal: No significant bony findings. IMPRESSION: 1. Distended gallbladder with numerous small layering gallstones. Gallbladder wall thickening and pericholecystic fluid suggesting acute cholecystitis. There is a 3.5 mm calculus in the gallbladder neck/cystic duct region. There are also 2 or 3 small stones in the distal common bile duct. Mild common bile duct dilatation at 9 mm. 2. Normal caliber and course of the main pancreatic duct. A 4 mm pancreatic body cyst is noted. 3. Small hepatic cysts. No worrisome hepatic lesions. Mild intrahepatic biliary dilatation. 4. Right renal cyst. 5. Small anterior abdominal wall hernia. Electronically Signed   By: Marijo Sanes M.D.   On: 05/03/2015 08:28   US Abdomen Limited Ruq  05/02/2015  CLINICAL DATA:  Epigastric pain today EXAM: US ABDOMEN LIMITED - RIGHT UPPER QUADRANT COMPARISON:  CT earlier today. FINDINGS: Gallbladder: Multiple gallstones. Gallbladder is distended. Mild wall thickening at 5 mm. Negative Murphy sign. Largest gallstone is 0.5 cm. Common bile duct: Diameter: 7 mm. Liver: Normal echogenicity. No focal solid mass. 8 mm benign appearing cyst in the left lobe. 1.6 x 1.3 x 2.0 cm benign appearing cyst in the left lobe corresponds to the CT abnormality noted earlier today. IMPRESSION: Cholelithiasis.  There is nonspecific gallbladder wall thickening. Common bile duct is borderline dilated at 7 mm. Correlate with liver function tests as for the need for further imaging such as MRCP toe evaluate for biliary dilatation Benign appearing liver cysts. Electronically Signed   By: Marybelle Killings M.D.   On: 05/02/2015 13:44    Anti-infectives: Anti-infectives    Start     Dose/Rate Route Frequency Ordered Stop   05/02/15 1115  piperacillin-tazobactam (ZOSYN) IVPB 3.375 g     3.375 g 100 mL/hr over 30 Minutes Intravenous  Once  05/02/15 1100 05/02/15 1210      Assessment/Plan: GSP  Will plan for or tomorrow if continues to do well. Discussed lap chole  with possible open procedure given prior surgeries Clears today, npo after mn   [facility] 05/03/2015

## 2015-05-03 NOTE — Progress Notes (Signed)
Initial Nutrition Assessment  DOCUMENTATION CODES:   Non-severe (moderate) malnutrition in context of chronic illness  INTERVENTION:  Provide Boost Breeze po TID, each supplement provides 250 kcal and 9 grams of protein Monitor diet advancement; add Ensure Enlive BID Provided and discussed "Fat-Restricted Nutrition Therapy" and "5 Sample Fat-Restricted Menus" handout from the Academy of [employer].    NUTRITION DIAGNOSIS:   Inadequate oral intake related to poor appetite as evidenced by per patient/family report, percent weight loss.   GOAL:   Patient will meet greater than or equal to 90% of their needs   MONITOR:   PO intake, Supplement acceptance, Diet advancement, Weight trends, Labs  REASON FOR ASSESSMENT:   Malnutrition Screening Tool    ASSESSMENT:   70 y.o. female with history of HTN and perforated gastric ulcer, presents with a three week history of diffuse, non-radiating upper abdominal pain. Pain intermittent, unrelated to eating, position or bowel movements. Some associated nausea. Lipase in 2000's. Gallstones and mild bilary duct dilation. Patient is s/p exploratory laparotomy and repair of perforated gastric ulcer September 2016.  Pt reports that after having surgery for her gastric ulcer in September 2016, she has had a poor appetite and an aversion to the smell of foods. She reports eating about 50% less than usual during this time. She states that she used to weigh 195 lbs > 1 year ago. Per weight history she has lost >23% of her body weight since September with weight loss slowing over the past 2 months. Weight loss is severe and she has mild muscle and fat wasting.  She reports drinking Ensure at home PTA when she can afford it.  Pt is currently on clear liquids and will be NPO at midnight. She states that she is having her gallbladder removed tomorrow and is asking what kind of diet she should follow. RD recommended a low fat diet; provided and  discussed handouts as listed above.   Labs reviewed.   Diet Order:  Diet clear liquid Room service appropriate?: Yes; Fluid consistency:: Thin Diet NPO time specified  Skin:  Reviewed, no issues  Last BM:  PTA  Height:   Ht Readings from Last 1 Encounters:  05/02/15 4\' 8"  (1.422 m)    Weight:   Wt Readings from Last 1 Encounters:  05/03/15 130 lb 11.7 oz (59.3 kg)    Ideal Body Weight:  42.4 kg  BMI:  Body mass index is 29.33 kg/(m^2).  Estimated Nutritional Needs:   Kcal:  [id number]  Protein:  70-80 grams  Fluid:  1.3-1.5 L/day  EDUCATION NEEDS:   Education needs addressed  Scarlette Ar RD, LDN Inpatient Clinical Dietitian Pager: [phone] After Hours Pager: [phone]

## 2015-05-04 DIAGNOSIS — E440 Moderate protein-calorie malnutrition: Secondary | ICD-10-CM | POA: Insufficient documentation

## 2015-05-04 DIAGNOSIS — R911 Solitary pulmonary nodule: Secondary | ICD-10-CM

## 2015-05-04 LAB — COMPREHENSIVE METABOLIC PANEL
ALT: 124 U/L — AB (ref 14–54)
ANION GAP: 12 (ref 5–15)
AST: 48 U/L — ABNORMAL HIGH (ref 15–41)
Albumin: 2.8 g/dL — ABNORMAL LOW (ref 3.5–5.0)
Alkaline Phosphatase: 215 U/L — ABNORMAL HIGH (ref 38–126)
BUN: 11 mg/dL (ref 6–20)
CHLORIDE: 106 mmol/L (ref 101–111)
CO2: 24 mmol/L (ref 22–32)
CREATININE: 1.18 mg/dL — AB (ref 0.44–1.00)
Calcium: 9 mg/dL (ref 8.9–10.3)
GFR calc non Af Amer: 46 mL/min — ABNORMAL LOW (ref >60)
GFR, EST AFRICAN AMERICAN: 53 mL/min — AB (ref >60)
Glucose, Bld: 92 mg/dL (ref 65–99)
POTASSIUM: 3.5 mmol/L (ref 3.5–5.1)
SODIUM: 142 mmol/L (ref 135–145)
TOTAL PROTEIN: 5.4 g/dL — AB (ref 6.5–8.1)
Total Bilirubin: 0.7 mg/dL (ref 0.3–1.2)

## 2015-05-04 LAB — LIPASE, BLOOD: LIPASE: 54 U/L — AB (ref 11–51)

## 2015-05-04 LAB — CBC
HCT: 35 % — ABNORMAL LOW (ref 36.0–46.0)
HEMOGLOBIN: 11.3 g/dL — AB (ref 12.0–15.0)
MCH: 29.7 pg (ref 26.0–34.0)
MCHC: 32.3 g/dL (ref 30.0–36.0)
MCV: 92.1 fL (ref 78.0–100.0)
PLATELETS: 293 10*3/uL (ref 150–400)
RBC: 3.8 MIL/uL — AB (ref 3.87–5.11)
RDW: 14.2 % (ref 11.5–15.5)
WBC: 8 10*3/uL (ref 4.0–10.5)

## 2015-05-04 NOTE — Progress Notes (Signed)
Patient ID: Sally Douglas, female   DOB: 05/10/1945, 70 y.o.   MRN: [id number]     [facility]      [address]   [place], [place] [address]    Phone: [phone] FAX: [phone]     Subjective: Tolerated  Clears.  Some pain.  LFTs down.  Lipase  48.  Up in hallways walking. Vss.  Afebrile.  Denies sob, cp.   Objective:  Vital signs:  Filed Vitals:   05/04/15 0500 05/04/15 0625 05/04/15 0929 05/04/15 0936  BP:  149/71  156/71  Pulse:  60 56 59  Temp:  98.4 F (36.9 C)  98.6 F (37 C)  TempSrc:  Oral  Oral  Resp:  18  18  Height:      Weight: 59.2 kg (130 lb 8.2 oz)     SpO2:  100%  97%    Last BM Date: 05/04/15  Intake/Output   Yesterday:  04/05 0701 - 04/06 0700 In: 240 [P.O.:240] Out: -  This shift:    I/O last 3 completed shifts: In: 240 [P.O.:240] Out: -    Physical Exam: General: Pt awake/alert/oriented x4 in no acute distress  Abdomen: Soft.  Nondistended. ttp epigastric/luq pain.  No evidence of peritonitis.  No incarcerated hernias.    Problem List:   Active Problems:   Essential hypertension   Nonischemic cardiomyopathy (HCC)   Pulmonary nodule   Acute gallstone pancreatitis   Pancreatitis   Malnutrition of moderate degree    Results:   Labs: Results for orders placed or performed during the hospital encounter of 05/02/15 (from the past 48 hour(s))  Lipase, blood     Status: Abnormal   Collection Time: 05/03/15  2:10 AM  Result Value Ref Range   Lipase 135 (H) 11 - 51 U/L  Comprehensive metabolic panel     Status: Abnormal   Collection Time: 05/03/15  2:10 AM  Result Value Ref Range   Sodium 143 135 - 145 mmol/L   Potassium 3.1 (L) 3.5 - 5.1 mmol/L    Comment: DELTA CHECK NOTED   Chloride 106 101 - 111 mmol/L   CO2 27 22 - 32 mmol/L   Glucose, Bld 87 65 - 99 mg/dL   BUN 11 6 - 20 mg/dL   Creatinine, Ser 1.47 0.44 - 1.00 mg/dL   Calcium 9.0 8.9 - 53.6 mg/dL   Total Protein 5.9 (L)  6.5 - 8.1 g/dL   Albumin 2.9 (L) 3.5 - 5.0 g/dL   AST 144 (H) 15 - 41 U/L   ALT 238 (H) 14 - 54 U/L   Alkaline Phosphatase 312 (H) 38 - 126 U/L   Total Bilirubin 1.4 (H) 0.3 - 1.2 mg/dL   GFR calc non Af Amer 56 (L) >60 mL/min   GFR calc Af Amer >60 >60 mL/min    Comment: (NOTE) The eGFR has been calculated using the CKD EPI equation. This calculation has not been validated in all clinical situations. eGFR's persistently <60 mL/min signify possible Chronic Kidney Disease.    Anion gap 10 5 - 15  CBC     Status: None   Collection Time: 05/03/15  2:10 AM  Result Value Ref Range   WBC 9.7 4.0 - 10.5 K/uL   RBC 4.32 3.87 - 5.11 MIL/uL   Hemoglobin 12.5 12.0 - 15.0 g/dL   HCT 18.0 60.7 - 14.9 %   MCV 91.2 78.0 - 100.0 fL   MCH 28.9 26.0 -  34.0 pg   MCHC 31.7 30.0 - 36.0 g/dL   RDW 13.9 11.5 - 15.5 %   Platelets 313 150 - 400 K/uL  Comprehensive metabolic panel     Status: Abnormal   Collection Time: 05/04/15  5:03 AM  Result Value Ref Range   Sodium 142 135 - 145 mmol/L   Potassium 3.5 3.5 - 5.1 mmol/L   Chloride 106 101 - 111 mmol/L   CO2 24 22 - 32 mmol/L   Glucose, Bld 92 65 - 99 mg/dL   BUN 11 6 - 20 mg/dL   Creatinine, Ser 1.18 (H) 0.44 - 1.00 mg/dL   Calcium 9.0 8.9 - 10.3 mg/dL   Total Protein 5.4 (L) 6.5 - 8.1 g/dL   Albumin 2.8 (L) 3.5 - 5.0 g/dL   AST 48 (H) 15 - 41 U/L   ALT 124 (H) 14 - 54 U/L   Alkaline Phosphatase 215 (H) 38 - 126 U/L   Total Bilirubin 0.7 0.3 - 1.2 mg/dL   GFR calc non Af Amer 46 (L) >60 mL/min   GFR calc Af Amer 53 (L) >60 mL/min    Comment: (NOTE) The eGFR has been calculated using the CKD EPI equation. This calculation has not been validated in all clinical situations. eGFR's persistently <60 mL/min signify possible Chronic Kidney Disease.    Anion gap 12 5 - 15  Lipase, blood     Status: Abnormal   Collection Time: 05/04/15  5:03 AM  Result Value Ref Range   Lipase 54 (H) 11 - 51 U/L  CBC     Status: Abnormal   Collection Time:  05/04/15  5:03 AM  Result Value Ref Range   WBC 8.0 4.0 - 10.5 K/uL   RBC 3.80 (L) 3.87 - 5.11 MIL/uL   Hemoglobin 11.3 (L) 12.0 - 15.0 g/dL   HCT 35.0 (L) 36.0 - 46.0 %   MCV 92.1 78.0 - 100.0 fL   MCH 29.7 26.0 - 34.0 pg   MCHC 32.3 30.0 - 36.0 g/dL   RDW 14.2 11.5 - 15.5 %   Platelets 293 150 - 400 K/uL    Imaging / Studies: Ct Abdomen Pelvis W Contrast  05/02/2015  CLINICAL DATA:  Midline epigastric pain, nausea starting 2 a.m., history of perforated ulcer EXAM: CT ABDOMEN AND PELVIS WITH CONTRAST TECHNIQUE: Multidetector CT imaging of the abdomen and pelvis was performed using the standard protocol following bolus administration of intravenous contrast. CONTRAST:  75 cc ISOVUE-300 IOPAMIDOL (ISOVUE-300) INJECTION 61% COMPARISON:  10/23/2014 FINDINGS: Lower chest: Again noted 11 cm nodule in right lower lobe. Further correlation with PET scan is recommended to exclude malignancy. Hepatobiliary: Borderline cardiomegaly. There is a distended gallbladder. Again noted layering sludge and tiny gallstones within gallbladder. CBD measures 8 mm in diameter. Mild intrahepatic biliary ductal dilatation. A left hepatic lobe cyst has decreased in size measures 1.8 cm. Mild thickening of gallbladder wall up to 4 mm. Subtle mild stranding of pericholecystic fat. Clinical correlation is necessary to exclude early cholecystitis. Further correlation with gallbladder ultrasound is recommended. Pancreas: Mild atrophic pancreas without focal abnormality. Spleen: Enhanced spleen is unremarkable. Adrenals/Urinary Tract: Adrenal glands are unremarkable. Enhanced kidneys are symmetrical in size. No hydronephrosis or hydroureter. Again noted a cyst in midpole posterior aspect of the right kidney measures 3.2 cm stable in size in appearance from prior exam. Delayed renal images shows bilateral renal symmetrical excretion. Central parapelvic cysts lower pole of the left kidney are stable. The urinary bladder is unremarkable.  Stomach/Bowel: No gastric outlet obstruction. Mild thickening of distal gastric wall. There is probable scarring lower anterior gastric wall. No perigastric fluid or free air the No small bowel obstruction. No mesenteric fluid collection. Moderate stool noted within cecum. No pericecal inflammation. Normal appendix partially visualized in axial image 48. Moderate stool and gas noted in transverse colon. No evidence of colitis or diverticulitis. No distal colonic obstruction. Vascular/Lymphatic: Mild atherosclerotic calcifications of distal abdominal aorta and iliac arteries. No aortic aneurysm. No retroperitoneal or mesenteric adenopathy. Reproductive: Again noted calcified fibroid within dorsal aspect of the uterus stable from prior exam. No adnexal mass. Other: There is no ascites or free air. Post hernia repair changes noted noted anterior abdominal wall. There is a small supraumbilical midline ventral hernia containing fat axial image 31 measures 3 cm without evidence of acute complication. Tiny umbilical hernia containing fat without evidence of acute complication. Axial image 48 there is a small right anterior abdominal wall infraumbilical region ventral hernia containing fat measures about 2.7 cm. No evidence of acute complication. Musculoskeletal: Again noted degenerative changes lumbar spine. Stable lumbar fusion at L4-L5 level. Degenerative changes bilateral hip joints. Degenerative changes pubic symphysis. IMPRESSION: 1. Again noted 11 mm nodule in right lower lobe. Further correlation with PET scan is recommended to exclude malignancy. 2. There is a distended gallbladder with mild enhancement and thickening of gallbladder wall. Minimal stranding of pericholecystic fat. Clinical correlation is necessary to exclude early cholecystitis. Further correlation with gallbladder ultrasound is recommended. Again noted layering sludge and small gallstones within gallbladder. Mild intrahepatic biliary ductal  dilatation. Mild CBD dilatation up to 8 mm. 3. Stable right renal cyst. Stable left parapelvic cysts. No hydronephrosis or hydroureter. 4. Mild thickening of distal gastric wall. Probable postsurgical changes lower anterior gastric wall. No evidence of perigastric fluid or free air. 5. Moderate stool noted in right colon and transverse colon. No evidence of colonic obstruction. No colitis or diverticulitis. 6. No small bowel obstruction. 7. There are small abdominal wall ventral hernias as described above without evidence of acute complications. 8. Again noted calcified fibroid within uterus. 9. Stable postsurgical changes lumbar spine. Degenerative changes lumbar spine. Electronically Signed   By: Lahoma Crocker M.D.   On: 05/02/2015 10:54   Mr 3d Recon At Scanner  05/03/2015  CLINICAL DATA:  Three-week history of diffuse upper abdominal pain. Abnormal ultrasound and CT scans. EXAM: MRI ABDOMEN WITHOUT AND WITH CONTRAST (INCLUDING MRCP) TECHNIQUE: Multiplanar multisequence MR imaging of the abdomen was performed both before and after the administration of intravenous contrast. Heavily T2-weighted images of the biliary and pancreatic ducts were obtained, and three-dimensional MRCP images were rendered by post processing. CONTRAST:  35m MULTIHANCE GADOBENATE DIMEGLUMINE 529 MG/ML IV SOLN COMPARISON:  Ultrasound 05/02/2015 and CT scan 05/02/2015. FINDINGS: Lower chest: The lung bases are clear of acute process. No pleural or pericardial effusion. Right lower lobe pulmonary nodule is again noted. Hepatobiliary: Simple hepatic cyst noted near the gallbladder in segment 4B. Other small scattered cysts are noted. No worrisome hepatic lesions or perihepatic fluid collections. Mild intrahepatic biliary dilatation. There are numerous gallstones in the gallbladder along with pericholecystic fluid and gallbladder wall thickening suggesting acute cholecystitis. The common bile duct measures 9 mm. 2 or 3 small common bile duct  stones are seen distally. Pancreas: No mass, inflammation or ductal dilatation. Spleen: Normal size.  No focal lesions. Adrenals/Urinary Tract: The adrenal glands are unremarkable. There is a simple right renal cyst. No worrisome renal lesions or hydronephrosis. Stomach/Bowel:  The stomach, duodenum, visualized small bowel and visualized colon are unremarkable. Vascular/Lymphatic: The aorta and branch vessels are patent. The major venous structures are patent. No mesenteric or retroperitoneal mass or adenopathy. Other: Anterior abdominal wall hernia containing omental fat. Possible surrounding prior surgical changes with enhancing granulation tissue. No ascites. Spinal fusion hardware noted. Musculoskeletal: No significant bony findings. IMPRESSION: 1. Distended gallbladder with numerous small layering gallstones. Gallbladder wall thickening and pericholecystic fluid suggesting acute cholecystitis. There is a 3.5 mm calculus in the gallbladder neck/cystic duct region. There are also 2 or 3 small stones in the distal common bile duct. Mild common bile duct dilatation at 9 mm. 2. Normal caliber and course of the main pancreatic duct. A 4 mm pancreatic body cyst is noted. 3. Small hepatic cysts. No worrisome hepatic lesions. Mild intrahepatic biliary dilatation. 4. Right renal cyst. 5. Small anterior abdominal wall hernia. Electronically Signed   By: Marijo Sanes M.D.   On: 05/03/2015 08:28   Mr Abd W/wo Cm/mrcp  05/03/2015  CLINICAL DATA:  Three-week history of diffuse upper abdominal pain. Abnormal ultrasound and CT scans. EXAM: MRI ABDOMEN WITHOUT AND WITH CONTRAST (INCLUDING MRCP) TECHNIQUE: Multiplanar multisequence MR imaging of the abdomen was performed both before and after the administration of intravenous contrast. Heavily T2-weighted images of the biliary and pancreatic ducts were obtained, and three-dimensional MRCP images were rendered by post processing. CONTRAST:  87m MULTIHANCE GADOBENATE DIMEGLUMINE  529 MG/ML IV SOLN COMPARISON:  Ultrasound 05/02/2015 and CT scan 05/02/2015. FINDINGS: Lower chest: The lung bases are clear of acute process. No pleural or pericardial effusion. Right lower lobe pulmonary nodule is again noted. Hepatobiliary: Simple hepatic cyst noted near the gallbladder in segment 4B. Other small scattered cysts are noted. No worrisome hepatic lesions or perihepatic fluid collections. Mild intrahepatic biliary dilatation. There are numerous gallstones in the gallbladder along with pericholecystic fluid and gallbladder wall thickening suggesting acute cholecystitis. The common bile duct measures 9 mm. 2 or 3 small common bile duct stones are seen distally. Pancreas: No mass, inflammation or ductal dilatation. Spleen: Normal size.  No focal lesions. Adrenals/Urinary Tract: The adrenal glands are unremarkable. There is a simple right renal cyst. No worrisome renal lesions or hydronephrosis. Stomach/Bowel: The stomach, duodenum, visualized small bowel and visualized colon are unremarkable. Vascular/Lymphatic: The aorta and branch vessels are patent. The major venous structures are patent. No mesenteric or retroperitoneal mass or adenopathy. Other: Anterior abdominal wall hernia containing omental fat. Possible surrounding prior surgical changes with enhancing granulation tissue. No ascites. Spinal fusion hardware noted. Musculoskeletal: No significant bony findings. IMPRESSION: 1. Distended gallbladder with numerous small layering gallstones. Gallbladder wall thickening and pericholecystic fluid suggesting acute cholecystitis. There is a 3.5 mm calculus in the gallbladder neck/cystic duct region. There are also 2 or 3 small stones in the distal common bile duct. Mild common bile duct dilatation at 9 mm. 2. Normal caliber and course of the main pancreatic duct. A 4 mm pancreatic body cyst is noted. 3. Small hepatic cysts. No worrisome hepatic lesions. Mild intrahepatic biliary dilatation. 4. Right  renal cyst. 5. Small anterior abdominal wall hernia. Electronically Signed   By: PMarijo SanesM.D.   On: 05/03/2015 08:28   [place]dom[place] Limited Ruq  05/02/2015  CLINICAL DATA:  Epigastric pain today EXAM: [place]DOM[place] LIMITED - RIGHT UPPER QUADRANT COMPARISON:  CT earlier today. FINDINGS: Gallbladder: Multiple gallstones. Gallbladder is distended. Mild wall thickening at 5 mm. Negative Murphy sign. Largest gallstone is 0.5 cm. Common bile duct:  Diameter: 7 mm. Liver: Normal echogenicity. No focal solid mass. 8 mm benign appearing cyst in the left lobe. 1.6 x 1.3 x 2.0 cm benign appearing cyst in the left lobe corresponds to the CT abnormality noted earlier today. IMPRESSION: Cholelithiasis.  There is nonspecific gallbladder wall thickening. Common bile duct is borderline dilated at 7 mm. Correlate with liver function tests as for the need for further imaging such as MRCP toe evaluate for biliary dilatation Benign appearing liver cysts. Electronically Signed   By: Marybelle Killings M.D.   On: 05/02/2015 13:44    Medications / Allergies:  Scheduled Meds: . diltiazem  360 mg Oral Daily  . enoxaparin (LOVENOX) injection  30 mg Subcutaneous Q24H  . feeding supplement  1 Container Oral TID PC  . pantoprazole  40 mg Oral Daily   Continuous Infusions:  PRN Meds:.acetaminophen **OR** acetaminophen, bisacodyl, hydrALAZINE, HYDROcodone-acetaminophen, HYDROmorphone (DILAUDID) injection, ondansetron **OR** ondansetron (ZOFRAN) IV, polyethylene glycol, traZODone, zolpidem  Antibiotics: Anti-infectives    Start     Dose/Rate Route Frequency Ordered Stop   05/02/15 1115  piperacillin-tazobactam (ZOSYN) IVPB 3.375 g     3.375 g 100 mL/hr over 30 Minutes Intravenous  Once 05/02/15 1100 05/02/15 1210        Assessment/Plan S/p exploratory laparotomy 09/2014 Dr. Grandville Silos for perforated gastric ulcer Laparoscopic ventral hernia repair with mesh 2012  Gallstone pancreatitis  Abnormal LFTs May have fulls, NPO  after midnight.  Plan for laparoscopic cholecystectomy with IOC in AM Non ischemic CMP/HTN-cleared for surgery  RLL lung nodule-followed by Dr. Halford Chessman. Due for repeat CT chest 08/2015.  VTE prophylaxis-SCD/lovenox Dispo-OR AM    Erby Pian, [facility] Pager [phone]) For consults and floor pages call [phone])  05/04/2015 10:13 AM

## 2015-05-04 NOTE — Progress Notes (Signed)
[facility] PROGRESS NOTE  Sally Douglas [id number] DOB: January 20, 1946 DOA: 05/02/2015  PCP: Nyoka Cowden, MD  Brief HPI: 70 year old Caucasian female with a past medical history of hypertension, perforated gastric ulcer, tobacco abuse, presented with abdominal pain and was found to have pancreatitis. She was also noted to have cholelithiasis. She was hospitalized for further management.  Past medical history:  Past Medical History  Diagnosis Date  . Insomnia     takes Ambien nightly  . Obesity   . Menopausal syndrome   . Tobacco abuse   . Low back pain   . Bronchitis     hx of-many yrs ago  . Abnormal finding on Pap smear     hx abnl pap  . Hypertension     takes Diltiazem and HCTZ daily  . Constipation     takes Miralax daily  . Arthritis   . Joint pain   . Joint swelling   . Perforated gastric ulcer ([place])     Consultants: Gen. surgery. Cardiology.  Procedures:  Plan is for laparoscopic cholecystectomy on April 6  Antibiotics: None  Subjective: Patient feels much better. Pain has resolved. Denies any nausea or vomiting. No chest pain or shortness of breath.   Objective: Vital Signs  Filed Vitals:   05/03/15 2149 05/04/15 0124 05/04/15 0500 05/04/15 0625  BP: 145/76 127/57  149/71  Pulse: 75 57  60  Temp: 98.3 F (36.8 C) 98.2 F (36.8 C)  98.4 F (36.9 C)  TempSrc: Oral Oral  Oral  Resp: 18 16  18   Height:      Weight:   59.2 kg (130 lb 8.2 oz)   SpO2: 99% 96%  100%    Intake/Output Summary (Last 24 hours) at 05/04/15 0752 Last data filed at 05/03/15 1945  Gross per 24 hour  Intake    240 ml  Output      0 ml  Net    240 ml   Filed Weights   05/02/15 2001 05/03/15 0553 05/04/15 0500  Weight: 59.1 kg (130 lb 4.7 oz) 59.3 kg (130 lb 11.7 oz) 59.2 kg (130 lb 8.2 oz)    General appearance: alert, cooperative and no distress Resp: clear to auscultation bilaterally Cardio: regular rate and rhythm, S1, S2 normal, no murmur,  click, rub or gallop GI: Abdomen is soft. Nontender today. No masses or organomegaly. Bowel sounds are present.  Extremities: extremities normal, atraumatic, no cyanosis or edema Neurology: Awake and alert. Oriented 3. No focal neurological deficits.  Lab Results:  Basic Metabolic Panel:  Recent Labs Lab 05/02/15 0827 05/03/15 0210 05/04/15 0503  NA 140 143 142  K 3.8 3.1* 3.5  CL 103 106 106  CO2 21* 27 24  GLUCOSE 110* 87 92  BUN 12 11 11   CREATININE 1.22* 1.00 1.18*  CALCIUM 9.9 9.0 9.0   Liver Function Tests:  Recent Labs Lab 05/02/15 0827 05/03/15 0210 05/04/15 0503  AST 253* 235* 48*  ALT 141* 238* 124*  ALKPHOS 307* 312* 215*  BILITOT 2.5* 1.4* 0.7  PROT 7.9 5.9* 5.4*  ALBUMIN 4.2 2.9* 2.8*    Recent Labs Lab 05/02/15 0827 05/03/15 0210 05/04/15 0503  LIPASE 2251* 135* 54*   CBC:  Recent Labs Lab 05/02/15 0827 05/03/15 0210 05/04/15 0503  WBC 13.2* 9.7 8.0  NEUTROABS 10.3*  --   --   HGB 13.7 12.5 11.3*  HCT 43.0 39.4 35.0*  MCV 91.1 91.2 92.1  PLT 322 313 293  Studies/Results: Ct Abdomen Pelvis W Contrast  05/02/2015  CLINICAL DATA:  Midline epigastric pain, nausea starting 2 a.m., history of perforated ulcer EXAM: CT ABDOMEN AND PELVIS WITH CONTRAST TECHNIQUE: Multidetector CT imaging of the abdomen and pelvis was performed using the standard protocol following bolus administration of intravenous contrast. CONTRAST:  75 cc ISOVUE-300 IOPAMIDOL (ISOVUE-300) INJECTION 61% COMPARISON:  10/23/2014 FINDINGS: Lower chest: Again noted 11 cm nodule in right lower lobe. Further correlation with PET scan is recommended to exclude malignancy. Hepatobiliary: Borderline cardiomegaly. There is a distended gallbladder. Again noted layering sludge and tiny gallstones within gallbladder. CBD measures 8 mm in diameter. Mild intrahepatic biliary ductal dilatation. A left hepatic lobe cyst has decreased in size measures 1.8 cm. Mild thickening of gallbladder wall  up to 4 mm. Subtle mild stranding of pericholecystic fat. Clinical correlation is necessary to exclude early cholecystitis. Further correlation with gallbladder ultrasound is recommended. Pancreas: Mild atrophic pancreas without focal abnormality. Spleen: Enhanced spleen is unremarkable. Adrenals/Urinary Tract: Adrenal glands are unremarkable. Enhanced kidneys are symmetrical in size. No hydronephrosis or hydroureter. Again noted a cyst in midpole posterior aspect of the right kidney measures 3.2 cm stable in size in appearance from prior exam. Delayed renal images shows bilateral renal symmetrical excretion. Central parapelvic cysts lower pole of the left kidney are stable. The urinary bladder is unremarkable. Stomach/Bowel: No gastric outlet obstruction. Mild thickening of distal gastric wall. There is probable scarring lower anterior gastric wall. No perigastric fluid or free air the No small bowel obstruction. No mesenteric fluid collection. Moderate stool noted within cecum. No pericecal inflammation. Normal appendix partially visualized in axial image 48. Moderate stool and gas noted in transverse colon. No evidence of colitis or diverticulitis. No distal colonic obstruction. Vascular/Lymphatic: Mild atherosclerotic calcifications of distal abdominal aorta and iliac arteries. No aortic aneurysm. No retroperitoneal or mesenteric adenopathy. Reproductive: Again noted calcified fibroid within dorsal aspect of the uterus stable from prior exam. No adnexal mass. Other: There is no ascites or free air. Post hernia repair changes noted noted anterior abdominal wall. There is a small supraumbilical midline ventral hernia containing fat axial image 31 measures 3 cm without evidence of acute complication. Tiny umbilical hernia containing fat without evidence of acute complication. Axial image 48 there is a small right anterior abdominal wall infraumbilical region ventral hernia containing fat measures about 2.7 cm. No  evidence of acute complication. Musculoskeletal: Again noted degenerative changes lumbar spine. Stable lumbar fusion at L4-L5 level. Degenerative changes bilateral hip joints. Degenerative changes pubic symphysis. IMPRESSION: 1. Again noted 11 mm nodule in right lower lobe. Further correlation with PET scan is recommended to exclude malignancy. 2. There is a distended gallbladder with mild enhancement and thickening of gallbladder wall. Minimal stranding of pericholecystic fat. Clinical correlation is necessary to exclude early cholecystitis. Further correlation with gallbladder ultrasound is recommended. Again noted layering sludge and small gallstones within gallbladder. Mild intrahepatic biliary ductal dilatation. Mild CBD dilatation up to 8 mm. 3. Stable right renal cyst. Stable left parapelvic cysts. No hydronephrosis or hydroureter. 4. Mild thickening of distal gastric wall. Probable postsurgical changes lower anterior gastric wall. No evidence of perigastric fluid or free air. 5. Moderate stool noted in right colon and transverse colon. No evidence of colonic obstruction. No colitis or diverticulitis. 6. No small bowel obstruction. 7. There are small abdominal wall ventral hernias as described above without evidence of acute complications. 8. Again noted calcified fibroid within uterus. 9. Stable postsurgical changes lumbar spine. Degenerative changes lumbar  spine. Electronically Signed   By: Lahoma Crocker M.D.   On: 05/02/2015 10:54   Mr 3d Recon At Scanner  05/03/2015  CLINICAL DATA:  Three-week history of diffuse upper abdominal pain. Abnormal ultrasound and CT scans. EXAM: MRI ABDOMEN WITHOUT AND WITH CONTRAST (INCLUDING MRCP) TECHNIQUE: Multiplanar multisequence MR imaging of the abdomen was performed both before and after the administration of intravenous contrast. Heavily T2-weighted images of the biliary and pancreatic ducts were obtained, and three-dimensional MRCP images were rendered by post  processing. CONTRAST:  41mL MULTIHANCE GADOBENATE DIMEGLUMINE 529 MG/ML IV SOLN COMPARISON:  Ultrasound 05/02/2015 and CT scan 05/02/2015. FINDINGS: Lower chest: The lung bases are clear of acute process. No pleural or pericardial effusion. Right lower lobe pulmonary nodule is again noted. Hepatobiliary: Simple hepatic cyst noted near the gallbladder in segment 4B. Other small scattered cysts are noted. No worrisome hepatic lesions or perihepatic fluid collections. Mild intrahepatic biliary dilatation. There are numerous gallstones in the gallbladder along with pericholecystic fluid and gallbladder wall thickening suggesting acute cholecystitis. The common bile duct measures 9 mm. 2 or 3 small common bile duct stones are seen distally. Pancreas: No mass, inflammation or ductal dilatation. Spleen: Normal size.  No focal lesions. Adrenals/Urinary Tract: The adrenal glands are unremarkable. There is a simple right renal cyst. No worrisome renal lesions or hydronephrosis. Stomach/Bowel: The stomach, duodenum, visualized small bowel and visualized colon are unremarkable. Vascular/Lymphatic: The aorta and branch vessels are patent. The major venous structures are patent. No mesenteric or retroperitoneal mass or adenopathy. Other: Anterior abdominal wall hernia containing omental fat. Possible surrounding prior surgical changes with enhancing granulation tissue. No ascites. Spinal fusion hardware noted. Musculoskeletal: No significant bony findings. IMPRESSION: 1. Distended gallbladder with numerous small layering gallstones. Gallbladder wall thickening and pericholecystic fluid suggesting acute cholecystitis. There is a 3.5 mm calculus in the gallbladder neck/cystic duct region. There are also 2 or 3 small stones in the distal common bile duct. Mild common bile duct dilatation at 9 mm. 2. Normal caliber and course of the main pancreatic duct. A 4 mm pancreatic body cyst is noted. 3. Small hepatic cysts. No worrisome  hepatic lesions. Mild intrahepatic biliary dilatation. 4. Right renal cyst. 5. Small anterior abdominal wall hernia. Electronically Signed   By: Marijo Sanes M.D.   On: 05/03/2015 08:28   Mr Abd W/wo Cm/mrcp  05/03/2015  CLINICAL DATA:  Three-week history of diffuse upper abdominal pain. Abnormal ultrasound and CT scans. EXAM: MRI ABDOMEN WITHOUT AND WITH CONTRAST (INCLUDING MRCP) TECHNIQUE: Multiplanar multisequence MR imaging of the abdomen was performed both before and after the administration of intravenous contrast. Heavily T2-weighted images of the biliary and pancreatic ducts were obtained, and three-dimensional MRCP images were rendered by post processing. CONTRAST:  4mL MULTIHANCE GADOBENATE DIMEGLUMINE 529 MG/ML IV SOLN COMPARISON:  Ultrasound 05/02/2015 and CT scan 05/02/2015. FINDINGS: Lower chest: The lung bases are clear of acute process. No pleural or pericardial effusion. Right lower lobe pulmonary nodule is again noted. Hepatobiliary: Simple hepatic cyst noted near the gallbladder in segment 4B. Other small scattered cysts are noted. No worrisome hepatic lesions or perihepatic fluid collections. Mild intrahepatic biliary dilatation. There are numerous gallstones in the gallbladder along with pericholecystic fluid and gallbladder wall thickening suggesting acute cholecystitis. The common bile duct measures 9 mm. 2 or 3 small common bile duct stones are seen distally. Pancreas: No mass, inflammation or ductal dilatation. Spleen: Normal size.  No focal lesions. Adrenals/Urinary Tract: The adrenal glands are unremarkable. There is  a simple right renal cyst. No worrisome renal lesions or hydronephrosis. Stomach/Bowel: The stomach, duodenum, visualized small bowel and visualized colon are unremarkable. Vascular/Lymphatic: The aorta and branch vessels are patent. The major venous structures are patent. No mesenteric or retroperitoneal mass or adenopathy. Other: Anterior abdominal wall hernia containing  omental fat. Possible surrounding prior surgical changes with enhancing granulation tissue. No ascites. Spinal fusion hardware noted. Musculoskeletal: No significant bony findings. IMPRESSION: 1. Distended gallbladder with numerous small layering gallstones. Gallbladder wall thickening and pericholecystic fluid suggesting acute cholecystitis. There is a 3.5 mm calculus in the gallbladder neck/cystic duct region. There are also 2 or 3 small stones in the distal common bile duct. Mild common bile duct dilatation at 9 mm. 2. Normal caliber and course of the main pancreatic duct. A 4 mm pancreatic body cyst is noted. 3. Small hepatic cysts. No worrisome hepatic lesions. Mild intrahepatic biliary dilatation. 4. Right renal cyst. 5. Small anterior abdominal wall hernia. Electronically Signed   By: Marijo Sanes M.D.   On: 05/03/2015 08:28   US Abdomen Limited Ruq  05/02/2015  CLINICAL DATA:  Epigastric pain today EXAM: US ABDOMEN LIMITED - RIGHT UPPER QUADRANT COMPARISON:  CT earlier today. FINDINGS: Gallbladder: Multiple gallstones. Gallbladder is distended. Mild wall thickening at 5 mm. Negative Murphy sign. Largest gallstone is 0.5 cm. Common bile duct: Diameter: 7 mm. Liver: Normal echogenicity. No focal solid mass. 8 mm benign appearing cyst in the left lobe. 1.6 x 1.3 x 2.0 cm benign appearing cyst in the left lobe corresponds to the CT abnormality noted earlier today. IMPRESSION: Cholelithiasis.  There is nonspecific gallbladder wall thickening. Common bile duct is borderline dilated at 7 mm. Correlate with liver function tests as for the need for further imaging such as MRCP toe evaluate for biliary dilatation Benign appearing liver cysts. Electronically Signed   By: Marybelle Killings M.D.   On: 05/02/2015 13:44    Medications:  Scheduled: . diltiazem  360 mg Oral Daily  . enoxaparin (LOVENOX) injection  30 mg Subcutaneous Q24H  . feeding supplement  1 Container Oral TID PC  . pantoprazole  40 mg Oral Daily     Continuous:   [id number] **OR** acetaminophen, bisacodyl, hydrALAZINE, HYDROcodone-acetaminophen, HYDROmorphone (DILAUDID) injection, ondansetron **OR** ondansetron (ZOFRAN) IV, polyethylene glycol, traZODone, zolpidem  Assessment/Plan:  Active Problems:   Essential hypertension   Nonischemic cardiomyopathy (HCC)   Pulmonary nodule   Acute gallstone pancreatitis   Pancreatitis   Malnutrition of moderate degree    Acute Gallstone pancreatitis  He shouldn't has improved. Patient remains stable. She is feeling better. Lipase is improved. Continue to monitor clinically. Symptomatic treatment.  Cholelithiasis with Cholecystitis and biliary ductal dilatation MRCP reviewed. Concern for cholecystitis. General surgery is following and plan is for surgical intervention today. I did discuss with Dr. Donne Hazel regarding small bile duct stones noted on the MRCP. He states that plan will be to proceed with cholecystectomy and he will perform cholangiogram intraoperatively. No need to involve gastroenterology at this time. Antibiotics per surgery. LFTs are improving.  Essential HTN Well controlled at home on one medication per patient. Patient says she gets hypertensive when hospitalized. Continue home Cardizem. Add hydralazine when necessary  Hx of nonischemic cardiomyopathy / grade I diastolic heart failure Seen by cardiology. No further workup is necessary as per their assessment.  Hx of perforated gastric ulcer Sept 2016 Stable  Pulmonary nodule, 34mm Evaluated by [facility] 03/23/15. Plan is for follow-up CT chest in August.   DVT Prophylaxis:  Lovenox    Code Status: Full code  Family Communication: Discussed with the patient and her husband  Disposition Plan: Await surgery.    LOS: 2 days   [facility] Pager [phone] 05/04/2015, 7:52 AM  If 7PM-7AM, please contact night-coverage at [email or web address], password [facility]

## 2015-05-04 NOTE — Progress Notes (Signed)
Patient upset that she did not get her cardizem this morning. It was held for HR less than 60 and this was explained to her but she still wants her cardizem and is adamant. i told her let me call the MD on call and check. HR currently above 60. MD Schorr called back and said she could take her normal home dose cardizem 360 1x now.

## 2015-05-05 ENCOUNTER — Encounter (HOSPITAL_COMMUNITY): Payer: Self-pay | Admitting: Certified Registered Nurse Anesthetist

## 2015-05-05 ENCOUNTER — Inpatient Hospital Stay (HOSPITAL_COMMUNITY): Payer: Medicare Other | Admitting: Anesthesiology

## 2015-05-05 ENCOUNTER — Encounter (HOSPITAL_COMMUNITY)
Admission: EM | Disposition: A | Discharge status: Home / Self Care | Payer: Self-pay | Source: Home / Self Care | Attending: Internal Medicine

## 2015-05-05 DIAGNOSIS — E876 Hypokalemia: Secondary | ICD-10-CM

## 2015-05-05 HISTORY — PX: CHOLECYSTECTOMY: SHX55

## 2015-05-05 LAB — CBC
HCT: 33.6 % — ABNORMAL LOW (ref 36.0–46.0)
Hemoglobin: 10.6 g/dL — ABNORMAL LOW (ref 12.0–15.0)
MCH: 28.6 pg (ref 26.0–34.0)
MCHC: 31.5 g/dL (ref 30.0–36.0)
MCV: 90.6 fL (ref 78.0–100.0)
PLATELETS: 280 10*3/uL (ref 150–400)
RBC: 3.71 MIL/uL — ABNORMAL LOW (ref 3.87–5.11)
RDW: 14 % (ref 11.5–15.5)
WBC: 7.3 10*3/uL (ref 4.0–10.5)

## 2015-05-05 LAB — COMPREHENSIVE METABOLIC PANEL
ALT: 106 U/L — ABNORMAL HIGH (ref 14–54)
ANION GAP: 9 (ref 5–15)
AST: 51 U/L — ABNORMAL HIGH (ref 15–41)
Albumin: 2.9 g/dL — ABNORMAL LOW (ref 3.5–5.0)
Alkaline Phosphatase: 280 U/L — ABNORMAL HIGH (ref 38–126)
BUN: 12 mg/dL (ref 6–20)
CHLORIDE: 108 mmol/L (ref 101–111)
CO2: 23 mmol/L (ref 22–32)
Calcium: 8.8 mg/dL — ABNORMAL LOW (ref 8.9–10.3)
Creatinine, Ser: 0.98 mg/dL (ref 0.44–1.00)
GFR, EST NON AFRICAN AMERICAN: 57 mL/min — AB (ref >60)
Glucose, Bld: 87 mg/dL (ref 65–99)
POTASSIUM: 3.2 mmol/L — AB (ref 3.5–5.1)
Sodium: 140 mmol/L (ref 135–145)
Total Bilirubin: 0.6 mg/dL (ref 0.3–1.2)
Total Protein: 5.3 g/dL — ABNORMAL LOW (ref 6.5–8.1)

## 2015-05-05 LAB — SURGICAL PCR SCREEN
MRSA, PCR: NEGATIVE
STAPHYLOCOCCUS AUREUS: NEGATIVE

## 2015-05-05 SURGERY — LAPAROSCOPIC CHOLECYSTECTOMY
Anesthesia: General | Site: Abdomen

## 2015-05-05 MED ORDER — PROPOFOL 10 MG/ML IV BOLUS
INTRAVENOUS | Status: AC
  Filled 2015-05-05: qty 20

## 2015-05-05 MED ORDER — SUGAMMADEX SODIUM 200 MG/2ML IV SOLN
INTRAVENOUS | Status: AC
  Filled 2015-05-05: qty 2

## 2015-05-05 MED ORDER — ONDANSETRON HCL 4 MG/2ML IJ SOLN
INTRAMUSCULAR | Status: DC | PRN
  Administered 2015-05-05: 4 mg via INTRAVENOUS

## 2015-05-05 MED ORDER — ROCURONIUM BROMIDE 100 MG/10ML IV SOLN
INTRAVENOUS | Status: DC | PRN
  Administered 2015-05-05: 40 mg via INTRAVENOUS

## 2015-05-05 MED ORDER — HYDROMORPHONE HCL 1 MG/ML IJ SOLN
INTRAMUSCULAR | Status: AC
  Filled 2015-05-05: qty 1

## 2015-05-05 MED ORDER — FENTANYL CITRATE (PF) 250 MCG/5ML IJ SOLN
INTRAMUSCULAR | Status: AC
  Filled 2015-05-05: qty 5

## 2015-05-05 MED ORDER — SODIUM CHLORIDE 0.9 % IV SOLN
INTRAVENOUS | Status: DC
  Administered 2015-05-05: 12:00:00 via INTRAVENOUS

## 2015-05-05 MED ORDER — MEPERIDINE HCL 25 MG/ML IJ SOLN: 6.2500 mg | INTRAMUSCULAR | Status: DC | PRN

## 2015-05-05 MED ORDER — OXYCODONE HCL 5 MG PO TABS: 5.0000 mg | ORAL_TABLET | Freq: Once | ORAL | Status: DC | PRN

## 2015-05-05 MED ORDER — MIDAZOLAM HCL 5 MG/5ML IJ SOLN
INTRAMUSCULAR | Status: DC | PRN
  Administered 2015-05-05: 1 mg via INTRAVENOUS

## 2015-05-05 MED ORDER — SUGAMMADEX SODIUM 500 MG/5ML IV SOLN
INTRAVENOUS | Status: AC
  Filled 2015-05-05: qty 5

## 2015-05-05 MED ORDER — SODIUM CHLORIDE 0.9 % IV SOLN
INTRAVENOUS | Status: DC | PRN
  Administered 2015-05-05: 14:00:00

## 2015-05-05 MED ORDER — PROPOFOL 10 MG/ML IV BOLUS
INTRAVENOUS | Status: DC | PRN
  Administered 2015-05-05: 140 mg via INTRAVENOUS

## 2015-05-05 MED ORDER — OXYCODONE-ACETAMINOPHEN 5-325 MG PO TABS
1.0000 | ORAL_TABLET | ORAL | Status: DC | PRN
Start: 2015-05-05 — End: 2015-05-07
  Administered 2015-05-05 – 2015-05-07 (×10): 2 via ORAL
  Filled 2015-05-05 (×9): qty 2

## 2015-05-05 MED ORDER — BUPIVACAINE-EPINEPHRINE 0.25% -1:200000 IJ SOLN
INTRAMUSCULAR | Status: DC | PRN
  Administered 2015-05-05: 13 mL

## 2015-05-05 MED ORDER — SUCCINYLCHOLINE CHLORIDE 20 MG/ML IJ SOLN
INTRAMUSCULAR | Status: AC
  Filled 2015-05-05: qty 1

## 2015-05-05 MED ORDER — SODIUM CHLORIDE 0.9 % IR SOLN
Status: DC | PRN
  Administered 2015-05-05: 1000 mL

## 2015-05-05 MED ORDER — PHENYLEPHRINE 40 MCG/ML (10ML) SYRINGE FOR IV PUSH (FOR BLOOD PRESSURE SUPPORT)
PREFILLED_SYRINGE | INTRAVENOUS | Status: AC
  Filled 2015-05-05: qty 10

## 2015-05-05 MED ORDER — IOPAMIDOL (ISOVUE-300) INJECTION 61%
INTRAVENOUS | Status: AC
  Filled 2015-05-05: qty 50

## 2015-05-05 MED ORDER — MIDAZOLAM HCL 2 MG/2ML IJ SOLN
INTRAMUSCULAR | Status: AC
  Filled 2015-05-05: qty 2

## 2015-05-05 MED ORDER — CEFAZOLIN SODIUM 1 G IJ SOLR
INTRAMUSCULAR | Status: AC
  Filled 2015-05-05: qty 20

## 2015-05-05 MED ORDER — HYDROMORPHONE HCL 1 MG/ML IJ SOLN
0.2500 mg | INTRAMUSCULAR | Status: DC | PRN
  Administered 2015-05-05 (×2): 0.5 mg via INTRAVENOUS
  Administered 2015-05-05: 1 mg via INTRAVENOUS

## 2015-05-05 MED ORDER — LIDOCAINE HCL (CARDIAC) 20 MG/ML IV SOLN
INTRAVENOUS | Status: DC | PRN
  Administered 2015-05-05: 70 mg via INTRAVENOUS

## 2015-05-05 MED ORDER — POTASSIUM CHLORIDE 10 MEQ/100ML IV SOLN: 10.0000 meq | INTRAVENOUS | Status: DC

## 2015-05-05 MED ORDER — 0.9 % SODIUM CHLORIDE (POUR BTL) OPTIME
TOPICAL | Status: DC | PRN
  Administered 2015-05-05 (×2): 1000 mL

## 2015-05-05 MED ORDER — LABETALOL HCL 5 MG/ML IV SOLN
INTRAVENOUS | Status: AC
  Filled 2015-05-05: qty 4

## 2015-05-05 MED ORDER — ROCURONIUM BROMIDE 50 MG/5ML IV SOLN
INTRAVENOUS | Status: AC
  Filled 2015-05-05: qty 1

## 2015-05-05 MED ORDER — LABETALOL HCL 5 MG/ML IV SOLN
INTRAVENOUS | Status: DC | PRN
  Administered 2015-05-05 (×2): 5 mg via INTRAVENOUS

## 2015-05-05 MED ORDER — LACTATED RINGERS IV SOLN
INTRAVENOUS | Status: DC | PRN
  Administered 2015-05-05 (×2): via INTRAVENOUS

## 2015-05-05 MED ORDER — SUGAMMADEX SODIUM 200 MG/2ML IV SOLN
INTRAVENOUS | Status: DC | PRN
  Administered 2015-05-05: 200 mg via INTRAVENOUS

## 2015-05-05 MED ORDER — OXYCODONE HCL 5 MG/5ML PO SOLN: 5.0000 mg | Freq: Once | ORAL | Status: DC | PRN

## 2015-05-05 MED ORDER — BUPIVACAINE-EPINEPHRINE (PF) 0.25% -1:200000 IJ SOLN
INTRAMUSCULAR | Status: AC
  Filled 2015-05-05: qty 30

## 2015-05-05 MED ORDER — OXYCODONE-ACETAMINOPHEN 5-325 MG PO TABS
ORAL_TABLET | ORAL | Status: AC
  Filled 2015-05-05: qty 2

## 2015-05-05 MED ORDER — LIDOCAINE HCL (CARDIAC) 20 MG/ML IV SOLN
INTRAVENOUS | Status: AC
  Filled 2015-05-05: qty 5

## 2015-05-05 MED ORDER — FENTANYL CITRATE (PF) 100 MCG/2ML IJ SOLN
INTRAMUSCULAR | Status: DC | PRN
  Administered 2015-05-05: 100 ug via INTRAVENOUS
  Administered 2015-05-05 (×7): 50 ug via INTRAVENOUS

## 2015-05-05 MED ORDER — CEFAZOLIN SODIUM-DEXTROSE 2-3 GM-% IV SOLR
INTRAVENOUS | Status: DC | PRN
  Administered 2015-05-05: 2 g via INTRAVENOUS

## 2015-05-05 MED ORDER — ONDANSETRON HCL 4 MG/2ML IJ SOLN
INTRAMUSCULAR | Status: AC
  Filled 2015-05-05: qty 2

## 2015-05-05 SURGICAL SUPPLY — 46 items
APPLIER CLIP 5 13 M/L LIGAMAX5 (MISCELLANEOUS) ×4
APR CLP MED LRG 5 ANG JAW (MISCELLANEOUS) ×2
BAG SPEC RTRVL 10 TROC 200 (ENDOMECHANICALS) ×2
BLADE SURG ROTATE 9660 (MISCELLANEOUS) IMPLANT
CANISTER SUCTION 2500CC (MISCELLANEOUS) ×4 IMPLANT
CHLORAPREP W/TINT 26ML (MISCELLANEOUS) ×4 IMPLANT
CLIP APPLIE 5 13 M/L LIGAMAX5 (MISCELLANEOUS) ×2 IMPLANT
CLOSURE WOUND 1/2 X4 (GAUZE/BANDAGES/DRESSINGS) ×1
COVER MAYO STAND STRL (DRAPES) ×4 IMPLANT
COVER SURGICAL LIGHT HANDLE (MISCELLANEOUS) ×4 IMPLANT
DEVICE TROCAR PUNCTURE CLOSURE (ENDOMECHANICALS) ×4 IMPLANT
DRAPE C-ARM 42X72 X-RAY (DRAPES) ×4 IMPLANT
ELECT PENCIL ROCKER SW 15FT (MISCELLANEOUS) ×3 IMPLANT
ELECT REM PT RETURN 9FT ADLT (ELECTROSURGICAL) ×4
ELECTRODE REM PT RTRN 9FT ADLT (ELECTROSURGICAL) ×2 IMPLANT
GLOVE BIO SURGEON STRL SZ7 (GLOVE) ×4 IMPLANT
GLOVE BIOGEL PI IND STRL 7.0 (GLOVE) ×1 IMPLANT
GLOVE BIOGEL PI IND STRL 7.5 (GLOVE) ×2 IMPLANT
GLOVE BIOGEL PI INDICATOR 7.0 (GLOVE) ×2
GLOVE BIOGEL PI INDICATOR 7.5 (GLOVE) ×2
GLOVE SURG SS PI 6.5 STRL IVOR (GLOVE) ×3 IMPLANT
GOWN STRL REUS W/ TWL LRG LVL3 (GOWN DISPOSABLE) ×6 IMPLANT
GOWN STRL REUS W/TWL LRG LVL3 (GOWN DISPOSABLE) ×12
KIT BASIN OR (CUSTOM PROCEDURE TRAY) ×4 IMPLANT
KIT ROOM TURNOVER OR (KITS) ×4 IMPLANT
LIQUID BAND (GAUZE/BANDAGES/DRESSINGS) ×4 IMPLANT
NS IRRIG 1000ML POUR BTL (IV SOLUTION) ×4 IMPLANT
PAD ARMBOARD 7.5X6 YLW CONV (MISCELLANEOUS) ×4 IMPLANT
POUCH RETRIEVAL ECOSAC 10 (ENDOMECHANICALS) ×2 IMPLANT
POUCH RETRIEVAL ECOSAC 10MM (ENDOMECHANICALS) ×2
SCISSORS LAP 5X35 DISP (ENDOMECHANICALS) ×4 IMPLANT
SET CHOLANGIOGRAPH 5 50 .035 (SET/KITS/TRAYS/PACK) ×4 IMPLANT
SET IRRIG TUBING LAPAROSCOPIC (IRRIGATION / IRRIGATOR) ×4 IMPLANT
SLEEVE ENDOPATH XCEL 5M (ENDOMECHANICALS) ×14 IMPLANT
SPECIMEN JAR SMALL (MISCELLANEOUS) ×4 IMPLANT
STRIP CLOSURE SKIN 1/2X4 (GAUZE/BANDAGES/DRESSINGS) ×3 IMPLANT
SUT MNCRL AB 4-0 PS2 18 (SUTURE) ×7 IMPLANT
SUT VICRYL 0 UR6 27IN ABS (SUTURE) ×10 IMPLANT
TOWEL OR 17X24 6PK STRL BLUE (TOWEL DISPOSABLE) ×4 IMPLANT
TOWEL OR 17X26 10 PK STRL BLUE (TOWEL DISPOSABLE) ×4 IMPLANT
TRAY LAPAROSCOPIC MC (CUSTOM PROCEDURE TRAY) ×4 IMPLANT
TROCAR ADV FIXATION 5X100MM (TROCAR) ×3 IMPLANT
TROCAR BLADELESS 11MM (ENDOMECHANICALS) ×3 IMPLANT
TROCAR XCEL BLUNT TIP 100MML (ENDOMECHANICALS) ×4 IMPLANT
TROCAR XCEL NON-BLD 5MMX100MML (ENDOMECHANICALS) ×4 IMPLANT
TUBING INSUFFLATION (TUBING) ×4 IMPLANT

## 2015-05-05 NOTE — Care Management Important Message (Signed)
Important Message  Patient Details  Name: Sally Douglas MRN: [id number] Date of Birth: 1946/01/04   Medicare Important Message Given:  Yes    Nathen May 05/05/2015, 10:45 AM

## 2015-05-05 NOTE — Op Note (Signed)
Preoperative diagnosis: gallstone pancreatitis Postoperative diagnosis: saa Procedure: laparoscopic cholecystectomy Surgeon: Dr Serita Grammes Anesthesia: general EBL: minimal Drains none Specimen gb and contents to pathology Complications: none Sponge count correct at completion Disposition to recovery stable  Indications: This is a 12 yof with gallstone pancreatitis who has resolved.  She has mesh from lap ventral hernia and a recent graham patch for a duodenal ulcer.  We discussed laparoscopic possible open cholecystectomy.   Procedure: After informed consent was obtained the patient was taken to the operating room. She was given antibiotics. Sequential compression devices were on her legs. She was placed under general anesthesia without complication. Her abdomen was prepped and draped in the standard sterile surgical fashion. A surgical timeout was then performed.  I first infiltrated marcaine in the left upper quadrant. I incised the fascia. I inserted my finger at the ribs and entered the abdomen. I then placed a 5 mm balloon tipped trocar and insufflated the abdomen to 15 mm hg pressure.  I then infiltrated marcaine below the umbilicus. I inserted initially a 5 mm trocar which I later sized to an 11 mm trocar.   I then inserted 4 further 5 mm trocars in the epigastrium and ruq. I had to lyse adhesions at the midline to put the epigastric trocar in. I was able to grasp the gallbladder and retract it cephalad and lateral. I then obtained the critical view of safety. I attempted a cholangiogram but was unable to pass the catheter after several tries and aborted this. I was able to identify the duct and place three clips proximally and one distally. The duct was divided. The duct was viable and the clips traversed the duct. I then treated the cystic artery the same.   I then removed the gallbladder from the liver bed and placed it in a bag. I removed the gallbladder from the umbilical  incision. I then obtained hemostasis and irrigated.  I then removed the infraumbilical trocar and closed with 0 vicryl and the endoclose device .  I then desufflated the abdomen and removed all my remaining trocars. I closed the luq trocar site with 0 vicryl suture as well. I then closed these with 4-0 Monocryl and Dermabond. She tolerated this well was extubated and transferred to the recovery room in stable condition

## 2015-05-05 NOTE — Progress Notes (Signed)
[facility] PROGRESS NOTE  Sally Douglas [id number] DOB: 1946/01/07 DOA: 05/02/2015  PCP: Nyoka Cowden, MD  Brief HPI: 70 year old Caucasian female with a past medical history of hypertension, perforated gastric ulcer, tobacco abuse, presented with abdominal pain and was found to have pancreatitis. She was also noted to have cholelithiasis. She was hospitalized for further management.  Past medical history:  Past Medical History  Diagnosis Date  . Insomnia     takes Ambien nightly  . Obesity   . Menopausal syndrome   . Tobacco abuse   . Low back pain   . Bronchitis     hx of-many yrs ago  . Abnormal finding on Pap smear     hx abnl pap  . Hypertension     takes Diltiazem and HCTZ daily  . Constipation     takes Miralax daily  . Arthritis   . Joint pain   . Joint swelling   . Perforated gastric ulcer ([place])     Consultants: Gen. surgery. Cardiology.  Procedures:  Plan is for laparoscopic cholecystectomy on April 7  Antibiotics: None  Subjective: Patient frustrated that she could not undergo surgery yesterday. She did have some abdominal pain and discomfort after having lunch yesterday. Improved this morning. Waiting for her surgical procedure.   Objective: Vital Signs  Filed Vitals:   05/04/15 1814 05/04/15 2200 05/05/15 0143 05/05/15 0500  BP: 168/72 130/66 121/54 165/69  Pulse: 67 68 63 53  Temp: 98.2 F (36.8 C) 98.9 F (37.2 C) 98.4 F (36.9 C) 98.4 F (36.9 C)  TempSrc: Oral Oral Oral Oral  Resp: 18 18 18 18   Height:      Weight:    57.6 kg (126 lb 15.8 oz)  SpO2: 99% 98% 96% 98%   No intake or output data in the 24 hours ending 05/05/15 0753 Filed Weights   05/03/15 0553 05/04/15 0500 05/05/15 0500  Weight: 59.3 kg (130 lb 11.7 oz) 59.2 kg (130 lb 8.2 oz) 57.6 kg (126 lb 15.8 oz)    General appearance: alert, cooperative and no distress Resp: clear to auscultation bilaterally Cardio: regular rate and rhythm, S1, S2 normal,  no murmur, click, rub or gallop GI: Abdomen is soft. Tender in the epigastric as well as right upper quadrant. No masses or organomegaly. Bowel sounds are present.  Extremities: extremities normal, atraumatic, no cyanosis or edema Neurology: Awake and alert. Oriented 3. No focal neurological deficits.  Lab Results:  Basic Metabolic Panel:  Recent Labs Lab 05/02/15 0827 05/03/15 0210 05/04/15 0503 05/05/15 0300  NA 140 143 142 140  K 3.8 3.1* 3.5 3.2*  CL 103 106 106 108  CO2 21* 27 24 23   GLUCOSE 110* 87 92 87  BUN 12 11 11 12   CREATININE 1.22* 1.00 1.18* 0.98  CALCIUM 9.9 9.0 9.0 8.8*   Liver Function Tests:  Recent Labs Lab 05/02/15 0827 05/03/15 0210 05/04/15 0503 05/05/15 0300  AST 253* 235* 48* 51*  ALT 141* 238* 124* 106*  ALKPHOS 307* 312* 215* 280*  BILITOT 2.5* 1.4* 0.7 0.6  PROT 7.9 5.9* 5.4* 5.3*  ALBUMIN 4.2 2.9* 2.8* 2.9*    Recent Labs Lab 05/02/15 0827 05/03/15 0210 05/04/15 0503  LIPASE 2251* 135* 54*   CBC:  Recent Labs Lab 05/02/15 0827 05/03/15 0210 05/04/15 0503 05/05/15 0300  WBC 13.2* 9.7 8.0 7.3  NEUTROABS 10.3*  --   --   --   HGB 13.7 12.5 11.3* 10.6*  HCT 43.0 39.4 35.0*  33.6*  MCV 91.1 91.2 92.1 90.6  PLT 322 313 293 280    Studies/Results: No results found.  Medications:  Scheduled: . diltiazem  360 mg Oral Daily  . enoxaparin (LOVENOX) injection  30 mg Subcutaneous Q24H  . feeding supplement  1 Container Oral TID PC  . pantoprazole  40 mg Oral Daily   Continuous:   [id number] **OR** acetaminophen, bisacodyl, hydrALAZINE, HYDROcodone-acetaminophen, HYDROmorphone (DILAUDID) injection, ondansetron **OR** ondansetron (ZOFRAN) IV, polyethylene glycol, traZODone, zolpidem  Assessment/Plan:  Active Problems:   Essential hypertension   Nonischemic cardiomyopathy (HCC)   Pulmonary nodule   Acute gallstone pancreatitis   Pancreatitis   Malnutrition of moderate degree    Acute Gallstone pancreatitis   Pancreatitis improved, though patient did have some pain yesterday which could have been related to food intake. Patient remains stable. Lipase is improved. Continue to monitor clinically. Symptomatic treatment.  Cholelithiasis with Cholecystitis and biliary ductal dilatation MRCP reviewed. Concern for cholecystitis. General surgery is following and plan is for surgical intervention today. I did discuss with Dr. Donne Hazel regarding small bile duct stones noted on the MRCP. He states that plan will be to proceed with cholecystectomy and he will perform cholangiogram intraoperatively. No need to involve gastroenterology at this time. Antibiotics per surgery. LFTs are improving.  Essential HTN Well controlled at home on one medication per patient. Patient says she gets hypertensive when hospitalized. Continue home Cardizem. Add hydralazine when necessary  Hx of nonischemic cardiomyopathy / grade I diastolic heart failure Seen by cardiology. No further workup is necessary as per their assessment.  Hx of perforated gastric ulcer Sept 2016 Stable. Not an active issue currently.  Pulmonary nodule, 62mm Evaluated by [facility] 03/23/15. Plan is for follow-up CT chest in August.  Hypokalemia Patient reports allergy to oral potassium. Apparently, when she has taken potassium tablets her face swells up. This has happened twice. But she has no problems eating bananas. Surgery is concerned that anesthesiologist may not be able to give her anesthesia due to low potassium. Patient has no experience with intravenous formulations. Surgery to address this with patient.  DVT Prophylaxis: Lovenox    Code Status: Full code  Family Communication: Discussed with the patient and her husband  Disposition Plan: Await surgery.    LOS: 3 days   [facility] Pager [phone] 05/05/2015, 7:53 AM  If 7PM-7AM, please contact night-coverage at [email or web address], password [facility]

## 2015-05-05 NOTE — Progress Notes (Signed)
Patient's spouse upset that patient has received a clear liquid diet. States Dr. Donne Hazel was in to see patient and told them she would remain nothing by mouth for now as surgery is tentatively scheduled for 05/04/15.  Advised pt's spouse that order for diet was placed by Dr. Donne Hazel with intent to change to NPO after midnight in preparation for surgery. Spouse again states that there "is no communication" in the hospital. Reinforced to family that nursing staff is indeed communicating all orders and changes in care plan as they happen. Wendee Copp

## 2015-05-05 NOTE — Transfer of Care (Signed)
Immediate Anesthesia Transfer of Care Note  Patient: Sally Douglas  Procedure(s) Performed: Procedure(s): LAPAROSCOPIC CHOLECYSTECTOMY (N/A)  Patient Location: PACU  Anesthesia Type:General  Level of Consciousness: awake, alert , oriented and patient cooperative  Airway & Oxygen Therapy: Patient Spontanous Breathing and Patient connected to nasal cannula oxygen  Post-op Assessment: Report given to RN and Post -op Vital signs reviewed and stable  Post vital signs: Reviewed and stable  Last Vitals:  Filed Vitals:   05/05/15 0943 05/05/15 1402  BP: 155/66 159/89  Pulse: 59   Temp: 36.9 C 36.5 C  Resp: 16 20    Complications: No apparent anesthesia complications

## 2015-05-05 NOTE — Anesthesia Postprocedure Evaluation (Signed)
Anesthesia Post Note  Patient: Sally Douglas  Procedure(s) Performed: Procedure(s) (LRB): LAPAROSCOPIC CHOLECYSTECTOMY (N/A)  Patient location during evaluation: PACU Anesthesia Type: General Level of consciousness: awake and alert Pain management: pain level controlled Vital Signs Assessment: post-procedure vital signs reviewed and stable Respiratory status: spontaneous breathing, nonlabored ventilation, respiratory function stable and patient connected to nasal cannula oxygen Cardiovascular status: blood pressure returned to baseline and stable Postop Assessment: no signs of nausea or vomiting Anesthetic complications: no    Last Vitals:  Filed Vitals:   05/05/15 1402 05/05/15 1412  BP: 159/89   Pulse:  63  Temp: 36.5 C   Resp: 20 22    Last Pain:  Filed Vitals:   05/05/15 1423  PainSc: 4                  Ronrico Dupin A

## 2015-05-05 NOTE — Progress Notes (Signed)
After receiving report from ED nurse, spoke with pt's daughter and spouse in waiting area regarding patient's current status in MRI and stable. Discussed possibility of surgery to remove gallbladder. Pt's spouse very upset about "lack of communication" in the hospital. Reinforced the current communication with patient's family eventhough pt has not yet reached the floor. Daughter agreeable. Spouse remains upset. Wendee Copp

## 2015-05-05 NOTE — Anesthesia Preprocedure Evaluation (Addendum)
Anesthesia Evaluation  Patient identified by MRN, date of birth, ID band Patient awake    Reviewed: Allergy & Precautions, NPO status , Patient's Chart, lab work & pertinent test results  Airway Mallampati: I  TM Distance: >3 FB Neck ROM: Full    Dental  (+) Dental Advisory Given, Edentulous Upper, Edentulous Lower   Pulmonary former smoker,    breath sounds clear to auscultation       Cardiovascular hypertension, Pt. on medications  Rhythm:Regular Rate:Normal     Neuro/Psych    GI/Hepatic PUD,   Endo/Other    Renal/GU      Musculoskeletal   Abdominal   Peds  Hematology   Anesthesia Other Findings   Reproductive/Obstetrics                            Anesthesia Physical Anesthesia Plan  ASA: III  Anesthesia Plan: General   Post-op Pain Management:    Induction: Intravenous  Airway Management Planned: Oral ETT  Additional Equipment:   Intra-op Plan:   Post-operative Plan: Extubation in OR  Informed Consent: I have reviewed the patients History and Physical, chart, labs and discussed the procedure including the risks, benefits and alternatives for the proposed anesthesia with the patient or authorized representative who has indicated his/her understanding and acceptance.   Dental advisory given  Plan Discussed with: CRNA, Anesthesiologist and Surgeon  Anesthesia Plan Comments:         Anesthesia Quick Evaluation

## 2015-05-05 NOTE — Anesthesia Procedure Notes (Signed)
Procedure Name: Intubation Date/Time: 05/05/2015 12:40 PM Performed by: Carney Living Pre-anesthesia Checklist: Patient identified, Emergency Drugs available, Suction available, Patient being monitored and Timeout performed Patient Re-evaluated:Patient Re-evaluated prior to inductionOxygen Delivery Method: Circle system utilized Preoxygenation: Pre-oxygenation with 100% oxygen Intubation Type: IV induction Ventilation: Mask ventilation without difficulty Laryngoscope Size: Mac and 4 Grade View: Grade I Tube type: Oral Tube size: 7.0 mm Number of attempts: 1 Airway Equipment and Method: Stylet Placement Confirmation: ETT inserted through vocal cords under direct vision,  positive ETCO2 and breath sounds checked- equal and bilateral Secured at: 20 cm Tube secured with: Tape Dental Injury: Teeth and Oropharynx as per pre-operative assessment

## 2015-05-05 NOTE — Progress Notes (Signed)
Pt to leave for OR to have gallbladder removed. Pt and spouse concerned about pt's current potassium level. Educated that level is low, but stable.  Pt is allergic to all prepared potassium replacements and is currently NPO due to surgery and cannot receive potassium through diet.  Informed patient and spouse that if level was critical and MD concerned, would transfer to critical care unit, replace potassium and treat reaction.  Explained surgeon and internal medicine aware of level and no new orders at this time. Pt and spouse asking what will be done about potassium level. Directed this question to MD. Wendee Copp

## 2015-05-06 LAB — COMPREHENSIVE METABOLIC PANEL
ALBUMIN: 2.7 g/dL — AB (ref 3.5–5.0)
ALK PHOS: 224 U/L — AB (ref 38–126)
ALT: 74 U/L — ABNORMAL HIGH (ref 14–54)
ANION GAP: 12 (ref 5–15)
AST: 39 U/L (ref 15–41)
BILIRUBIN TOTAL: 0.8 mg/dL (ref 0.3–1.2)
BUN: 10 mg/dL (ref 6–20)
CALCIUM: 8.4 mg/dL — AB (ref 8.9–10.3)
CO2: 21 mmol/L — ABNORMAL LOW (ref 22–32)
Chloride: 107 mmol/L (ref 101–111)
Creatinine, Ser: 0.98 mg/dL (ref 0.44–1.00)
GFR calc Af Amer: >60 mL/min (ref >60)
GFR, EST NON AFRICAN AMERICAN: 57 mL/min — AB (ref >60)
GLUCOSE: 86 mg/dL (ref 65–99)
Potassium: 3.3 mmol/L — ABNORMAL LOW (ref 3.5–5.1)
Sodium: 140 mmol/L (ref 135–145)
TOTAL PROTEIN: 5.3 g/dL — AB (ref 6.5–8.1)

## 2015-05-06 LAB — CBC
HEMATOCRIT: 32.8 % — AB (ref 36.0–46.0)
HEMOGLOBIN: 10.2 g/dL — AB (ref 12.0–15.0)
MCH: 28.6 pg (ref 26.0–34.0)
MCHC: 31.1 g/dL (ref 30.0–36.0)
MCV: 91.9 fL (ref 78.0–100.0)
Platelets: 256 10*3/uL (ref 150–400)
RBC: 3.57 MIL/uL — ABNORMAL LOW (ref 3.87–5.11)
RDW: 14.2 % (ref 11.5–15.5)
WBC: 9.7 10*3/uL (ref 4.0–10.5)

## 2015-05-06 LAB — MAGNESIUM: MAGNESIUM: 1.6 mg/dL — AB (ref 1.7–2.4)

## 2015-05-06 MED ORDER — MAGNESIUM SULFATE 2 GM/50ML IV SOLN
2.0000 g | Freq: Once | INTRAVENOUS | Status: DC
  Administered 2015-05-06: 2 g via INTRAVENOUS
  Filled 2015-05-06: qty 50

## 2015-05-06 MED ORDER — MAGNESIUM OXIDE 400 (241.3 MG) MG PO TABS
400.0000 mg | ORAL_TABLET | Freq: Two times a day (BID) | ORAL | Status: DC
  Administered 2015-05-06 – 2015-05-07 (×3): 400 mg via ORAL
  Filled 2015-05-06 (×4): qty 1

## 2015-05-06 NOTE — Progress Notes (Signed)
[facility] PROGRESS NOTE  TWANNA Douglas [id number] DOB: 03-27-1945 DOA: 05/02/2015  PCP: Nyoka Cowden, MD  Brief HPI: 70 year old Caucasian female with a past medical history of hypertension, perforated gastric ulcer, tobacco abuse, presented with abdominal pain and was found to have pancreatitis. She was also noted to have cholelithiasis. She was hospitalized for further management.  Past medical history:  Past Medical History  Diagnosis Date  . Insomnia     takes Ambien nightly  . Obesity   . Menopausal syndrome   . Tobacco abuse   . Low back pain   . Bronchitis     hx of-many yrs ago  . Abnormal finding on Pap smear     hx abnl pap  . Hypertension     takes Diltiazem and HCTZ daily  . Constipation     takes Miralax daily  . Arthritis   . Joint pain   . Joint swelling   . Perforated gastric ulcer ([place])     Consultants: Gen. surgery. Cardiology.  Procedures:  Laparoscopic cholecystectomy on April 7  Antibiotics: None  Subjective: Patient complains of some sore nurse at her operative site. Denies any nausea, vomiting. She has ambulated in the hallway without any difficulty. Denies any difficulty breathing.   Objective: Vital Signs  Filed Vitals:   05/05/15 1752 05/05/15 2201 05/06/15 0222 05/06/15 0544  BP: 130/62 120/60 126/63 121/57  Pulse: 64 64 61 66  Temp: 98 F (36.7 C) 98.4 F (36.9 C) 99.5 F (37.5 C) 98.9 F (37.2 C)  TempSrc: Oral Oral Oral Oral  Resp: 16 18 18 18   Height:      Weight:    62.2 kg (137 lb 2 oz)  SpO2: 93% 95% 96% 96%    Intake/Output Summary (Last 24 hours) at 05/06/15 0857 Last data filed at 05/05/15 1500  Gross per 24 hour  Intake   1300 ml  Output     76 ml  Net   1224 ml   Filed Weights   05/04/15 0500 05/05/15 0500 05/06/15 0544  Weight: 59.2 kg (130 lb 8.2 oz) 57.6 kg (126 lb 15.8 oz) 62.2 kg (137 lb 2 oz)    General appearance: alert, cooperative and no distress Resp: clear to  auscultation bilaterally Cardio: regular rate and rhythm, S1, S2 normal, no murmur, click, rub or gallop GI: Abdomen is soft. Remains slightly Tender in the epigastric as well as right upper quadrant. No masses or organomegaly. Bowel sounds are present.  Extremities: extremities normal, atraumatic, no cyanosis or edema Neurology: Awake and alert. Oriented 3. No focal neurological deficits.  Lab Results:  Basic Metabolic Panel:  Recent Labs Lab 05/02/15 0827 05/03/15 0210 05/04/15 0503 05/05/15 0300 05/06/15 0353  NA 140 143 142 140 140  K 3.8 3.1* 3.5 3.2* 3.3*  CL 103 106 106 108 107  CO2 21* 27 24 23  21*  GLUCOSE 110* 87 92 87 86  BUN 12 11 11 12 10   CREATININE 1.22* 1.00 1.18* 0.98 0.98  CALCIUM 9.9 9.0 9.0 8.8* 8.4*  MG  --   --   --   --  1.6*   Liver Function Tests:  Recent Labs Lab 05/02/15 0827 05/03/15 0210 05/04/15 0503 05/05/15 0300 05/06/15 0353  AST 253* 235* 48* 51* 39  ALT 141* 238* 124* 106* 74*  ALKPHOS 307* 312* 215* 280* 224*  BILITOT 2.5* 1.4* 0.7 0.6 0.8  PROT 7.9 5.9* 5.4* 5.3* 5.3*  ALBUMIN 4.2 2.9* 2.8* 2.9* 2.7*  Recent Labs Lab 05/02/15 0827 05/03/15 0210 05/04/15 0503  LIPASE 2251* 135* 54*   CBC:  Recent Labs Lab 05/02/15 0827 05/03/15 0210 05/04/15 0503 05/05/15 0300 05/06/15 0353  WBC 13.2* 9.7 8.0 7.3 9.7  NEUTROABS 10.3*  --   --   --   --   HGB 13.7 12.5 11.3* 10.6* 10.2*  HCT 43.0 39.4 35.0* 33.6* 32.8*  MCV 91.1 91.2 92.1 90.6 91.9  PLT 322 313 293 280 256    Studies/Results: No results found.  Medications:  Scheduled: . diltiazem  360 mg Oral Daily  . enoxaparin (LOVENOX) injection  30 mg Subcutaneous Q24H  . feeding supplement  1 Container Oral TID PC  . pantoprazole  40 mg Oral Daily   Continuous: . sodium chloride 10 mL/hr at 05/05/15 1223   [id number] **OR** acetaminophen, bisacodyl, hydrALAZINE, HYDROcodone-acetaminophen, HYDROmorphone (DILAUDID) injection, ondansetron **OR**  ondansetron (ZOFRAN) IV, oxyCODONE-acetaminophen, polyethylene glycol, traZODone, zolpidem  Assessment/Plan:  Principal Problem:   Acute gallstone pancreatitis Active Problems:   Essential hypertension   Nonischemic cardiomyopathy (HCC)   Pulmonary nodule   Pancreatitis   Malnutrition of moderate degree    Acute Gallstone pancreatitis  Patient is improved. And she remains stable. Lipase is improved. Continue to monitor clinically. Symptomatic treatment.  Cholelithiasis with Cholecystitis and biliary ductal dilatation MRCP reviewed. Concern for cholecystitis. Patient was seen by general surgery. She underwent laparoscopic cholecystectomy on 4/7. Small bile duct stones were identified on MRCP. This was discussed with general surgery. Plan was to do a intraoperative cholangiogram. However, this was not successful. Discussed with Dr. Grandville Silos, general surgery, this morning. He does not recommend any further evaluation, as long as her LFTs continued to improve. LFTs are noted to be improving. His bilirubin is normal. Will be repeated tomorrow morning.  Essential HTN Well controlled at home on one medication per patient. Patient says she gets hypertensive when hospitalized. Continue home Cardizem. Add hydralazine when necessary.  Hx of nonischemic cardiomyopathy / grade I diastolic heart failure Seen by cardiology. No further workup is necessary as per their assessment.  Hx of perforated gastric ulcer Sept 2016 Stable. Not an active issue currently.  Pulmonary nodule, 40mm Evaluated by [facility] 03/23/15. Plan is for follow-up CT chest in August.  Hypokalemia She could not be given oral potassium yesterday due to reported allergy. Potassium is better this morning. Magnesium is noted to be low. She can have a few bananas today. She'll be given oral magnesium as she has lost her IV access and refuses to have another one placed.   DVT Prophylaxis: Lovenox    Code Status: Full code   Family Communication: Discussed with the patient and her husband  Disposition Plan: Continue mobilizing. Repeat labs tomorrow. Anticipate discharge tomorrow.    LOS: 4 days   [facility] Pager [phone] 05/06/2015, 8:57 AM  If 7PM-7AM, please contact night-coverage at [email or web address], password [facility]

## 2015-05-06 NOTE — Progress Notes (Signed)
Patient in the room with her spouse this morning, requested and received pain medicine as ordered, denies, having nausea/vomiting. Will continue to monitor.

## 2015-05-06 NOTE — Progress Notes (Signed)
1 Day Post-Op  Subjective: Up in chair, a little sore but hungry. Walked this AM.  Objective: Vital signs in last 24 hours: Temp:  [97.7 F (36.5 C)-99.5 F (37.5 C)] 98.2 F (36.8 C) (04/08 0904) Pulse Rate:  [54-80] 80 (04/08 0904) Resp:  [12-22] 20 (04/08 0904) BP: (120-162)/(57-99) 155/99 mmHg (04/08 0904) SpO2:  [93 %-100 %] 97 % (04/08 0904) Weight:  [62.2 kg (137 lb 2 oz)] 62.2 kg (137 lb 2 oz) (04/08 0544) Last BM Date: 05/05/15  Intake/Output from previous day: 04/07 0701 - 04/08 0700 In: 1300 [I.V.:1300] Out: 76 [Urine:75; Stool:1] Intake/Output this shift:    General appearance: cooperative GI: soft, incisions intact with steri strips  Lab Results:   Recent Labs  05/05/15 0300 05/06/15 0353  WBC 7.3 9.7  HGB 10.6* 10.2*  HCT 33.6* 32.8*  PLT 280 256   BMET  Recent Labs  05/05/15 0300 05/06/15 0353  NA 140 140  K 3.2* 3.3*  CL 108 107  CO2 23 21*  GLUCOSE 87 86  BUN 12 10  CREATININE 0.98 0.98  CALCIUM 8.8* 8.4*   PT/INR No results for input(s): LABPROT, INR in the last 72 hours. ABG No results for input(s): PHART, HCO3 in the last 72 hours.  Invalid input(s): PCO2, PO2  Studies/Results: No results found.  Anti-infectives: Anti-infectives    Start     Dose/Rate Route Frequency Ordered Stop   05/02/15 1115  piperacillin-tazobactam (ZOSYN) IVPB 3.375 g     3.375 g 100 mL/hr over 30 Minutes Intravenous  Once 05/02/15 1100 05/02/15 1210      Assessment/Plan: Biliary pancreatitis - S/P lap chole 4/7 Donne Hazel). Advance diet. Anticipate D/C tomorrow. I spoke with her son and gave him a note for work.  LOS: 4 days    Delawrence Fridman E 05/06/2015

## 2015-05-07 LAB — COMPREHENSIVE METABOLIC PANEL
ALK PHOS: 192 U/L — AB (ref 38–126)
ALT: 49 U/L (ref 14–54)
ANION GAP: 10 (ref 5–15)
AST: 24 U/L (ref 15–41)
Albumin: 2.7 g/dL — ABNORMAL LOW (ref 3.5–5.0)
BUN: 9 mg/dL (ref 6–20)
CALCIUM: 8.8 mg/dL — AB (ref 8.9–10.3)
CHLORIDE: 104 mmol/L (ref 101–111)
CO2: 26 mmol/L (ref 22–32)
Creatinine, Ser: 1.07 mg/dL — ABNORMAL HIGH (ref 0.44–1.00)
GFR, EST AFRICAN AMERICAN: 60 mL/min — AB (ref >60)
GFR, EST NON AFRICAN AMERICAN: 51 mL/min — AB (ref >60)
Glucose, Bld: 91 mg/dL (ref 65–99)
Potassium: 3.9 mmol/L (ref 3.5–5.1)
SODIUM: 140 mmol/L (ref 135–145)
Total Bilirubin: 0.7 mg/dL (ref 0.3–1.2)
Total Protein: 5.3 g/dL — ABNORMAL LOW (ref 6.5–8.1)

## 2015-05-07 LAB — CBC
HCT: 32.5 % — ABNORMAL LOW (ref 36.0–46.0)
Hemoglobin: 10 g/dL — ABNORMAL LOW (ref 12.0–15.0)
MCH: 28.4 pg (ref 26.0–34.0)
MCHC: 30.8 g/dL (ref 30.0–36.0)
MCV: 92.3 fL (ref 78.0–100.0)
PLATELETS: 258 10*3/uL (ref 150–400)
RBC: 3.52 MIL/uL — ABNORMAL LOW (ref 3.87–5.11)
RDW: 14.2 % (ref 11.5–15.5)
WBC: 8.6 10*3/uL (ref 4.0–10.5)

## 2015-05-07 MED ORDER — MAGNESIUM OXIDE 400 (241.3 MG) MG PO TABS: 400.0000 mg | ORAL_TABLET | Freq: Two times a day (BID) | ORAL | Status: DC

## 2015-05-07 NOTE — Discharge Instructions (Signed)
LAPAROSCOPIC SURGERY: POST OP INSTRUCTIONS ° °1. DIET: Follow a light bland diet the first 24 hours after arrival home, such as soup, liquids, crackers, etc.  Be sure to include lots of fluids daily.  Avoid fast food or heavy meals as your are more likely to get nauseated.  Eat a low fat the next few days after surgery.   °2. Take your usually prescribed home medications unless otherwise directed. °3. PAIN CONTROL: °a. Pain is best controlled by a usual combination of three different methods TOGETHER: °i. Ice/Heat °ii. Over the counter pain medication °iii. Prescription pain medication °b. Most patients will experience some swelling and bruising around the incisions.  Ice packs or heating pads (30-60 minutes up to 6 times a day) will help. Use ice for the first few days to help decrease swelling and bruising, then switch to heat to help relax tight/sore spots and speed recovery.  Some people prefer to use ice alone, heat alone, alternating between ice & heat.  Experiment to what works for you.  Swelling and bruising can take several weeks to resolve.   °c. It is helpful to take an over-the-counter pain medication regularly for the first few weeks.  Choose one of the following that works best for you: °i. Naproxen (Aleve, etc)  Two 220mg tabs twice a day °ii. Ibuprofen (Advil, etc) Three 200mg tabs four times a day (every meal & bedtime) °iii. Acetaminophen (Tylenol, etc) 500-650mg four times a day (every meal & bedtime) °d. A  prescription for pain medication (such as oxycodone, hydrocodone, etc) should be given to you upon discharge.  Take your pain medication as prescribed.  °i. If you are having problems/concerns with the prescription medicine (does not control pain, nausea, vomiting, rash, itching, etc), please call us [phone] to see if we need to switch you to a different pain medicine that will work better for you and/or control your side effect better. °ii. If you need a refill on your pain medication,  please contact your pharmacy.  They will contact our office to request authorization. Prescriptions will not be filled after 5 pm or on week-ends. °4. Avoid getting constipated.  Between the surgery and the pain medications, it is common to experience some constipation.  Increasing fluid intake and taking a fiber supplement (such as Metamucil, Citrucel, FiberCon, MiraLax, etc) 1-2 times a day regularly will usually help prevent this problem from occurring.  A mild laxative (prune juice, Milk of Magnesia, MiraLax, etc) should be taken according to package directions if there are no bowel movements after 48 hours.   °5. Watch out for diarrhea.  If you have many loose bowel movements, simplify your diet to bland foods & liquids for a few days.  Stop any stool softeners and decrease your fiber supplement.  Switching to mild anti-diarrheal medications (Kayopectate, Pepto Bismol) can help.  If this worsens or does not improve, please call us. °6. Wash / shower every day.  You may shower over the dressings as they are waterproof.  Continue to shower over incision(s) after the dressing is off. °7. Remove your waterproof bandages 5 days after surgery.  You may leave the incision open to air.  You may replace a dressing/Band-Aid to cover the incision for comfort if you wish.  °8. ACTIVITIES as tolerated:   °a. You may resume regular (light) daily activities beginning the next day--such as daily self-care, walking, climbing stairs--gradually increasing activities as tolerated.  If you can walk 30 minutes without difficulty, it   is safe to try more intense activity such as jogging, treadmill, bicycling, low-impact aerobics, swimming, etc. b. Save the most intensive and strenuous activity for last such as sit-ups, heavy lifting, contact sports, etc  Refrain from any heavy lifting or straining until you are off narcotics for pain control.   c. DO NOT PUSH THROUGH PAIN.  Let pain be your guide: If it hurts to do something, don't  do it.  Pain is your body warning you to avoid that activity for another week until the pain goes down. d. You may drive when you are no longer taking prescription pain medication, you can comfortably wear a seatbelt, and you can safely maneuver your car and apply brakes. e. Dennis Bast may have sexual intercourse when it is comfortable.  9. FOLLOW UP in our office a. Please call [facility] at [phone] to set up an appointment to see your surgeon in the office for a follow-up appointment approximately 2-3 weeks after your surgery. b. Make sure that you call for this appointment the day you arrive home to insure a convenient appointment time. 10. IF YOU HAVE DISABILITY OR FAMILY LEAVE FORMS, BRING THEM TO THE OFFICE FOR PROCESSING.  DO NOT GIVE THEM TO YOUR DOCTOR.   WHEN TO CALL us [phone]: 1. Poor pain control 2. Reactions / problems with new medications (rash/itching, nausea, etc)  3. Fever over 101.5 F (38.5 C) 4. Inability to urinate 5. Nausea and/or vomiting 6. Worsening swelling or bruising 7. Continued bleeding from incision. 8. Increased pain, redness, or drainage from the incision   The clinic staff is available to answer your questions during regular business hours (8:30am-5pm).  Please dont hesitate to call and ask to speak to one of our nurses for clinical concerns.   If you have a medical emergency, go to the nearest emergency room or call 911.  A surgeon from [facility] is always on call at the [facility], [place], [place], [place], [place]  [id number] ? MAIN: [phone] ? TOLL FREE: [phone] ?  FAX [phone] [email or web address] PATIENT INSTRUCTIONS GALL BLADDER SURGERY (CHOLECYSTECTOMY)  FOLLOW-UP:    Call your physician immediately if you have any fevers greater than 102.5, drainage from your wound that is not clear or looks infected, persistent bleeding, increasing abdominal pain,  problems urinating, or persistent nausea/vomiting.  You should be aware that you may have right shoulder pain after surgery and that this will progressively go away.  This is called 'referred pain' and is from the area of the gallbladder.  It can also be caused by gas that may be trapped under the diaphragm from the surgery, especially if it was performed laparoscopically through mini-incisions.  This gas will progressively get reabsorbed by your body.   WOUND CARE INSTRUCTIONS:  Keep a dry clean dressing on the wound if there is drainage. The initial bandage may be removed after 24 hours.  Once the wound has quit draining you may leave it open to air.  If clothing rubs against the wound or causes irritation and the wound is not draining you may cover it with a dry dressing during the daytime.  Try to keep the wound dry and avoid ointments on the wound unless directed to do so.  If the wound becomes bright red and painful or starts to drain infected material that is not clear, please contact your physician immediately.   You should also call if you begin to  drain fluid that is thin and greenish-brown from the wound and appears to look like bile.  If the wound though is mildly pink and has a thick firm ridge underneath it, this is normal, and is referred to as a healing ridge.  This will resolve over the next 4-6 weeks.  DIET:  You may eat any foods that you can tolerate.  It is a good idea to eat a high fiber diet and take in plenty of fluids to prevent constipation.  If you do become constipated you may want to take a mild laxative or take ducolax tablets on a daily basis until your bowel habits are regular.  Constipation can be very uncomfortable, along with straining, after recent abdominal surgery.  ACTIVITY:  You are encouraged to cough and deep breath or use your incentive spirometer if you were given one, every 15-30 minutes when awake.  This will help prevent respiratory complications and low grade  fevers post-operatively.  You may want to hug a pillow when coughing and sneezing to add additional support to the surgical area(s) which will decrease pain during these times.  You are encouraged to walk and engage in light activity for the next two weeks.  You should not lift more than 20 pounds during this time frame as it could put you at increased risk for a post-operative hernia.  Twenty pounds is roughly equivalent to a plastic bag of groceries.    MEDICATIONS:  Try to take narcotic medications and anti-inflammatory medications, such as tylenol, ibuprofen, naprosyn, etc., with food.  This will minimize stomach upset from the medication.  Should you develop nausea and vomiting from the pain medication, or develop a rash, please discontinue the medication and contact your physician.  You should not drive, make important decisions, or operate machinery when taking narcotic pain medication.  QUESTIONS:  Please feel free to call your physician or the hospital operator if you have any questions, and they will be glad to assist you.

## 2015-05-07 NOTE — Discharge Summary (Signed)
[facility]  Physician Discharge Summary   Patient ID: Sally Douglas MRN: [id number] DOB/AGE: 06-30-1945 70 y.o.  Admit date: 05/02/2015 Discharge date: 05/07/2015  PCP: Nyoka Cowden, MD  DISCHARGE DIAGNOSES:  Principal Problem:   Acute gallstone pancreatitis Active Problems:   Essential hypertension   Nonischemic cardiomyopathy (HCC)   Pulmonary nodule   Pancreatitis   Malnutrition of moderate degree   RECOMMENDATIONS FOR OUTPATIENT FOLLOW UP: 1. Patient instructed to follow-up with the general surgeon, as scheduled   DISCHARGE CONDITION: fair  Diet recommendation: Heart healthy  Filed Weights   05/05/15 0500 05/06/15 0544 05/07/15 0249  Weight: 57.6 kg (126 lb 15.8 oz) 62.2 kg (137 lb 2 oz) 61.7 kg (136 lb 0.4 oz)    INITIAL HISTORY: 69 year old Caucasian female with a past medical history of hypertension, perforated gastric ulcer, tobacco abuse, presented with abdominal pain and was found to have pancreatitis. She was also noted to have cholelithiasis. She was hospitalized for further management.  Consultations:  General surgery and cardiology  Procedures:  Laparoscopic cholecystectomy April 7  HOSPITAL COURSE:   Acute Gallstone pancreatitis  Patient is improved. She status post cholecystectomy.   Cholelithiasis with Cholecystitis and biliary ductal dilatation Patient was seen by general surgery. She underwent MRCP. There was concern for cholecystitis. LFTs were elevated. Patient underwent cholecystectomy. Small bile duct stones were identified on MRCP. This was discussed with general surgery. Plan was to do a intraoperative cholangiogram. However, this was not successful. Discussed with Dr. Grandville Silos, general surgery. He does not recommend any further evaluation, as long as her LFTs continued to improve. LFTs are noted to be improving. His bilirubin is normal. Patient is symptomatically improved. Okay for discharge home today.  Essential  HTN Well controlled at home on one medication per patient. Patient says she gets hypertensive when hospitalized. Continue home Cardizem.   Hx of nonischemic cardiomyopathy / grade I diastolic heart failure Seen by cardiology. No further workup is necessary as per their assessment.  Hx of perforated gastric ulcer Sept 2016 Stable. Not an active issue currently.  Pulmonary nodule, 6mm Evaluated by [facility] 03/23/15. Plan is for follow-up CT chest in August.  Hypokalemia/hypomagnesemia She could not be given oral potassium due to reported allergy. Potassium is normal this morning. Magnesium is noted to be low. She will be given a prescription for same.   Overall improved. Patient keen on going home today. She has been ambulating without difficulty. Cleared by general surgery. Okay for discharge today.  PERTINENT LABS:  The results of significant diagnostics from this hospitalization (including imaging, microbiology, ancillary and laboratory) are listed below for reference.    Microbiology: Recent Results (from the past 240 hour(s))  Surgical pcr screen     Status: None   Collection Time: 05/05/15  5:24 AM  Result Value Ref Range Status   MRSA, PCR NEGATIVE NEGATIVE Final   Staphylococcus aureus NEGATIVE NEGATIVE Final    Comment:        The Xpert SA Assay (FDA approved for NASAL specimens in patients over 39 years of age), is one component of a comprehensive surveillance program.  Test performance has been validated by [facility] for patients greater than or equal to 64 year old. It is not intended to diagnose infection nor to guide or monitor treatment.      Labs: Basic Metabolic Panel:  Recent Labs Lab 05/03/15 0210 05/04/15 0503 05/05/15 0300 05/06/15 0353 05/07/15 0514  NA 143 142 140 140 140  K 3.1* 3.5  3.2* 3.3* 3.9  CL 106 106 108 107 104  CO2 27 24 23  21* 26  GLUCOSE 87 92 87 86 91  BUN 11 11 12 10 9   CREATININE 1.00 1.18* 0.98 0.98 1.07*   CALCIUM 9.0 9.0 8.8* 8.4* 8.8*  MG  --   --   --  1.6*  --    Liver Function Tests:  Recent Labs Lab 05/03/15 0210 05/04/15 0503 05/05/15 0300 05/06/15 0353 05/07/15 0514  AST 235* 48* 51* 39 24  ALT 238* 124* 106* 74* 49  ALKPHOS 312* 215* 280* 224* 192*  BILITOT 1.4* 0.7 0.6 0.8 0.7  PROT 5.9* 5.4* 5.3* 5.3* 5.3*  ALBUMIN 2.9* 2.8* 2.9* 2.7* 2.7*    Recent Labs Lab 05/02/15 0827 05/03/15 0210 05/04/15 0503  LIPASE 2251* 135* 54*   CBC:  Recent Labs Lab 05/02/15 0827 05/03/15 0210 05/04/15 0503 05/05/15 0300 05/06/15 0353 05/07/15 0514  WBC 13.2* 9.7 8.0 7.3 9.7 8.6  NEUTROABS 10.3*  --   --   --   --   --   HGB 13.7 12.5 11.3* 10.6* 10.2* 10.0*  HCT 43.0 39.4 35.0* 33.6* 32.8* 32.5*  MCV 91.1 91.2 92.1 90.6 91.9 92.3  PLT 322 313 293 280 256 258    IMAGING STUDIES Ct Abdomen Pelvis W Contrast  05/02/2015  CLINICAL DATA:  Midline epigastric pain, nausea starting 2 a.m., history of perforated ulcer EXAM: CT ABDOMEN AND PELVIS WITH CONTRAST TECHNIQUE: Multidetector CT imaging of the abdomen and pelvis was performed using the standard protocol following bolus administration of intravenous contrast. CONTRAST:  75 cc ISOVUE-300 IOPAMIDOL (ISOVUE-300) INJECTION 61% COMPARISON:  10/23/2014 FINDINGS: Lower chest: Again noted 11 cm nodule in right lower lobe. Further correlation with PET scan is recommended to exclude malignancy. Hepatobiliary: Borderline cardiomegaly. There is a distended gallbladder. Again noted layering sludge and tiny gallstones within gallbladder. CBD measures 8 mm in diameter. Mild intrahepatic biliary ductal dilatation. A left hepatic lobe cyst has decreased in size measures 1.8 cm. Mild thickening of gallbladder wall up to 4 mm. Subtle mild stranding of pericholecystic fat. Clinical correlation is necessary to exclude early cholecystitis. Further correlation with gallbladder ultrasound is recommended. Pancreas: Mild atrophic pancreas without focal  abnormality. Spleen: Enhanced spleen is unremarkable. Adrenals/Urinary Tract: Adrenal glands are unremarkable. Enhanced kidneys are symmetrical in size. No hydronephrosis or hydroureter. Again noted a cyst in midpole posterior aspect of the right kidney measures 3.2 cm stable in size in appearance from prior exam. Delayed renal images shows bilateral renal symmetrical excretion. Central parapelvic cysts lower pole of the left kidney are stable. The urinary bladder is unremarkable. Stomach/Bowel: No gastric outlet obstruction. Mild thickening of distal gastric wall. There is probable scarring lower anterior gastric wall. No perigastric fluid or free air the No small bowel obstruction. No mesenteric fluid collection. Moderate stool noted within cecum. No pericecal inflammation. Normal appendix partially visualized in axial image 48. Moderate stool and gas noted in transverse colon. No evidence of colitis or diverticulitis. No distal colonic obstruction. Vascular/Lymphatic: Mild atherosclerotic calcifications of distal abdominal aorta and iliac arteries. No aortic aneurysm. No retroperitoneal or mesenteric adenopathy. Reproductive: Again noted calcified fibroid within dorsal aspect of the uterus stable from prior exam. No adnexal mass. Other: There is no ascites or free air. Post hernia repair changes noted noted anterior abdominal wall. There is a small supraumbilical midline ventral hernia containing fat axial image 31 measures 3 cm without evidence of acute complication. Tiny umbilical hernia containing fat without  evidence of acute complication. Axial image 48 there is a small right anterior abdominal wall infraumbilical region ventral hernia containing fat measures about 2.7 cm. No evidence of acute complication. Musculoskeletal: Again noted degenerative changes lumbar spine. Stable lumbar fusion at L4-L5 level. Degenerative changes bilateral hip joints. Degenerative changes pubic symphysis. IMPRESSION: 1. Again  noted 11 mm nodule in right lower lobe. Further correlation with PET scan is recommended to exclude malignancy. 2. There is a distended gallbladder with mild enhancement and thickening of gallbladder wall. Minimal stranding of pericholecystic fat. Clinical correlation is necessary to exclude early cholecystitis. Further correlation with gallbladder ultrasound is recommended. Again noted layering sludge and small gallstones within gallbladder. Mild intrahepatic biliary ductal dilatation. Mild CBD dilatation up to 8 mm. 3. Stable right renal cyst. Stable left parapelvic cysts. No hydronephrosis or hydroureter. 4. Mild thickening of distal gastric wall. Probable postsurgical changes lower anterior gastric wall. No evidence of perigastric fluid or free air. 5. Moderate stool noted in right colon and transverse colon. No evidence of colonic obstruction. No colitis or diverticulitis. 6. No small bowel obstruction. 7. There are small abdominal wall ventral hernias as described above without evidence of acute complications. 8. Again noted calcified fibroid within uterus. 9. Stable postsurgical changes lumbar spine. Degenerative changes lumbar spine. Electronically Signed   By: Lahoma Crocker M.D.   On: 05/02/2015 10:54   Mr 3d Recon At Scanner  05/03/2015  CLINICAL DATA:  Three-week history of diffuse upper abdominal pain. Abnormal ultrasound and CT scans. EXAM: MRI ABDOMEN WITHOUT AND WITH CONTRAST (INCLUDING MRCP) TECHNIQUE: Multiplanar multisequence MR imaging of the abdomen was performed both before and after the administration of intravenous contrast. Heavily T2-weighted images of the biliary and pancreatic ducts were obtained, and three-dimensional MRCP images were rendered by post processing. CONTRAST:  90mL MULTIHANCE GADOBENATE DIMEGLUMINE 529 MG/ML IV SOLN COMPARISON:  Ultrasound 05/02/2015 and CT scan 05/02/2015. FINDINGS: Lower chest: The lung bases are clear of acute process. No pleural or pericardial effusion.  Right lower lobe pulmonary nodule is again noted. Hepatobiliary: Simple hepatic cyst noted near the gallbladder in segment 4B. Other small scattered cysts are noted. No worrisome hepatic lesions or perihepatic fluid collections. Mild intrahepatic biliary dilatation. There are numerous gallstones in the gallbladder along with pericholecystic fluid and gallbladder wall thickening suggesting acute cholecystitis. The common bile duct measures 9 mm. 2 or 3 small common bile duct stones are seen distally. Pancreas: No mass, inflammation or ductal dilatation. Spleen: Normal size.  No focal lesions. Adrenals/Urinary Tract: The adrenal glands are unremarkable. There is a simple right renal cyst. No worrisome renal lesions or hydronephrosis. Stomach/Bowel: The stomach, duodenum, visualized small bowel and visualized colon are unremarkable. Vascular/Lymphatic: The aorta and branch vessels are patent. The major venous structures are patent. No mesenteric or retroperitoneal mass or adenopathy. Other: Anterior abdominal wall hernia containing omental fat. Possible surrounding prior surgical changes with enhancing granulation tissue. No ascites. Spinal fusion hardware noted. Musculoskeletal: No significant bony findings. IMPRESSION: 1. Distended gallbladder with numerous small layering gallstones. Gallbladder wall thickening and pericholecystic fluid suggesting acute cholecystitis. There is a 3.5 mm calculus in the gallbladder neck/cystic duct region. There are also 2 or 3 small stones in the distal common bile duct. Mild common bile duct dilatation at 9 mm. 2. Normal caliber and course of the main pancreatic duct. A 4 mm pancreatic body cyst is noted. 3. Small hepatic cysts. No worrisome hepatic lesions. Mild intrahepatic biliary dilatation. 4. Right renal cyst. 5. Small anterior  abdominal wall hernia. Electronically Signed   By: Marijo Sanes M.D.   On: 05/03/2015 08:28   Mr Abd W/wo Cm/mrcp  05/03/2015  CLINICAL DATA:   Three-week history of diffuse upper abdominal pain. Abnormal ultrasound and CT scans. EXAM: MRI ABDOMEN WITHOUT AND WITH CONTRAST (INCLUDING MRCP) TECHNIQUE: Multiplanar multisequence MR imaging of the abdomen was performed both before and after the administration of intravenous contrast. Heavily T2-weighted images of the biliary and pancreatic ducts were obtained, and three-dimensional MRCP images were rendered by post processing. CONTRAST:  59mL MULTIHANCE GADOBENATE DIMEGLUMINE 529 MG/ML IV SOLN COMPARISON:  Ultrasound 05/02/2015 and CT scan 05/02/2015. FINDINGS: Lower chest: The lung bases are clear of acute process. No pleural or pericardial effusion. Right lower lobe pulmonary nodule is again noted. Hepatobiliary: Simple hepatic cyst noted near the gallbladder in segment 4B. Other small scattered cysts are noted. No worrisome hepatic lesions or perihepatic fluid collections. Mild intrahepatic biliary dilatation. There are numerous gallstones in the gallbladder along with pericholecystic fluid and gallbladder wall thickening suggesting acute cholecystitis. The common bile duct measures 9 mm. 2 or 3 small common bile duct stones are seen distally. Pancreas: No mass, inflammation or ductal dilatation. Spleen: Normal size.  No focal lesions. Adrenals/Urinary Tract: The adrenal glands are unremarkable. There is a simple right renal cyst. No worrisome renal lesions or hydronephrosis. Stomach/Bowel: The stomach, duodenum, visualized small bowel and visualized colon are unremarkable. Vascular/Lymphatic: The aorta and branch vessels are patent. The major venous structures are patent. No mesenteric or retroperitoneal mass or adenopathy. Other: Anterior abdominal wall hernia containing omental fat. Possible surrounding prior surgical changes with enhancing granulation tissue. No ascites. Spinal fusion hardware noted. Musculoskeletal: No significant bony findings. IMPRESSION: 1. Distended gallbladder with numerous small  layering gallstones. Gallbladder wall thickening and pericholecystic fluid suggesting acute cholecystitis. There is a 3.5 mm calculus in the gallbladder neck/cystic duct region. There are also 2 or 3 small stones in the distal common bile duct. Mild common bile duct dilatation at 9 mm. 2. Normal caliber and course of the main pancreatic duct. A 4 mm pancreatic body cyst is noted. 3. Small hepatic cysts. No worrisome hepatic lesions. Mild intrahepatic biliary dilatation. 4. Right renal cyst. 5. Small anterior abdominal wall hernia. Electronically Signed   By: Marijo Sanes M.D.   On: 05/03/2015 08:28   US Abdomen Limited Ruq  05/02/2015  CLINICAL DATA:  Epigastric pain today EXAM: US ABDOMEN LIMITED - RIGHT UPPER QUADRANT COMPARISON:  CT earlier today. FINDINGS: Gallbladder: Multiple gallstones. Gallbladder is distended. Mild wall thickening at 5 mm. Negative Murphy sign. Largest gallstone is 0.5 cm. Common bile duct: Diameter: 7 mm. Liver: Normal echogenicity. No focal solid mass. 8 mm benign appearing cyst in the left lobe. 1.6 x 1.3 x 2.0 cm benign appearing cyst in the left lobe corresponds to the CT abnormality noted earlier today. IMPRESSION: Cholelithiasis.  There is nonspecific gallbladder wall thickening. Common bile duct is borderline dilated at 7 mm. Correlate with liver function tests as for the need for further imaging such as MRCP toe evaluate for biliary dilatation Benign appearing liver cysts. Electronically Signed   By: Marybelle Killings M.D.   On: 05/02/2015 13:44    DISCHARGE EXAMINATION: Filed Vitals:   05/07/15 0249 05/07/15 0257 05/07/15 0527 05/07/15 0916  BP:  128/59 133/79 153/80  Pulse:  58 60 68  Temp:  97.9 F (36.6 C) 97.7 F (36.5 C) 97.8 F (36.6 C)  TempSrc:  Oral Oral Axillary  Resp:  16 18 18   Height:      Weight: 61.7 kg (136 lb 0.4 oz)     SpO2:  92% 93% 97%   General appearance: alert, cooperative, appears stated age and no distress Resp: clear to auscultation  bilaterally Cardio: regular rate and rhythm, S1, S2 normal, no murmur, click, rub or gallop GI: soft, non-tender; bowel sounds normal; no masses,  no organomegaly Extremities: extremities normal, atraumatic, no cyanosis or edema  DISPOSITION: Home  Discharge Instructions    Call MD for:  difficulty breathing, headache or visual disturbances    Complete by:  As directed      Call MD for:  extreme fatigue    Complete by:  As directed      Call MD for:  persistant dizziness or light-headedness    Complete by:  As directed      Call MD for:  persistant nausea and vomiting    Complete by:  As directed      Call MD for:  severe uncontrolled pain    Complete by:  As directed      Call MD for:  temperature >100.4    Complete by:  As directed      Diet - low sodium heart healthy    Complete by:  As directed      Discharge instructions    Complete by:  As directed   Please follow up with your surgeon as instructed.  You were cared for by a hospitalist during your hospital stay. If you have any questions about your discharge medications or the care you received while you were in the hospital after you are discharged, you can call the unit and asked to speak with the hospitalist on call if the hospitalist that took care of you is not available. Once you are discharged, your primary care physician will handle any further medical issues. Please note that NO REFILLS for any discharge medications will be authorized once you are discharged, as it is imperative that you return to your primary care physician (or establish a relationship with a primary care physician if you do not have one) for your aftercare needs so that they can reassess your need for medications and monitor your lab values. If you do not have a primary care physician, you can call [phone] for a physician referral.     Increase activity slowly    Complete by:  As directed            ALLERGIES:  Allergies  Allergen Reactions  .  K-Dur [Potassium Chloride] Swelling    Cannot tolerate Potassium in any prepared form. Causes Face Swelling  . Nsaids Other (See Comments)    H/o PUD with perforation     Discharge Medication List as of 05/07/2015 10:24 AM    START taking these medications   Details  magnesium oxide (MAG-OX) 400 (241.3 Mg) MG tablet Take 1 tablet (400 mg total) by mouth 2 (two) times daily. For 5 days, Starting 05/07/2015, Until Discontinued, Print      CONTINUE these medications which have NOT CHANGED   Details  bifidobacterium infantis (ALIGN) capsule Take 1 capsule by mouth daily., Until Discontinued, Historical Med    diltiazem (CARDIZEM CD) 360 MG 24 hr capsule Take 360 mg by mouth daily., Starting 02/17/2015, Until Discontinued, Historical Med    feeding supplement, ENSURE ENLIVE, (ENSURE ENLIVE) LIQD Take 237 mLs by mouth 2 (two) times daily between meals., Starting 10/22/2014, Until Discontinued, No Print  furosemide (LASIX) 20 MG tablet Take 20 mg by mouth daily., Starting 02/28/2015, Until Discontinued, Historical Med    HYDROcodone-acetaminophen (NORCO) 10-325 MG per tablet Take 0.5-2 tablets by mouth every 6 (six) hours as needed for moderate pain or severe pain (Prescribed by Dr. Maryjean Ka)., Starting 10/22/2014, Until Discontinued, Print    hydrOXYzine (ATARAX/VISTARIL) 10 MG tablet Take 1 tablet (10 mg total) by mouth 3 (three) times daily as needed., Starting 04/12/2015, Until Discontinued, Normal    omeprazole (PRILOSEC) 40 MG capsule Take 40 mg by mouth daily., Starting 02/17/2015, Until Discontinued, Historical Med    polyethylene glycol (MIRALAX / GLYCOLAX) packet Take 17 g by mouth daily as needed for mild constipation. , Until Discontinued, Historical Med    VOLTAREN 1 % GEL Apply 1 application topically daily as needed. FOR HANDS, Starting 09/17/2014, Until Discontinued, Historical Med    zolpidem (AMBIEN) 10 MG tablet Take 1 tablet (10 mg total) by mouth at bedtime as needed for sleep.,  Starting 04/27/2015, Until Discontinued, Phone In       Follow-up Information    Follow up with [facility] On 05/24/2015.   Specialty:  General Surgery   Why:  arrive by 11:30AM for a 12PM post op check   Contact information:   [address] [place] [address] [phone]       Schedule an appointment as soon as possible for a visit with Nyoka Cowden, MD.   Specialty:  Internal Medicine   Why:  As needed   Contact information:   [place] [address] [phone]       TOTAL DISCHARGE TIME: 78 minutes  [facility] Pager [phone]  05/07/2015, 12:34 PM

## 2015-05-07 NOTE — Progress Notes (Signed)
2 Days Post-Op  Subjective: better  Objective: Vital signs in last 24 hours: Temp:  [97.7 F (36.5 C)-99 F (37.2 C)] 97.8 F (36.6 C) (04/09 0916) Pulse Rate:  [58-99] 68 (04/09 0916) Resp:  [16-20] 18 (04/09 0916) BP: (128-168)/(59-93) 153/80 mmHg (04/09 0916) SpO2:  [92 %-97 %] 97 % (04/09 0916) Weight:  [61.7 kg (136 lb 0.4 oz)] 61.7 kg (136 lb 0.4 oz) (04/09 0249) Last BM Date: 05/05/15  Intake/Output from previous day:   Intake/Output this shift:    General appearance: cooperative Resp: clear to auscultation bilaterally GI: soft, incisions CDI with steri strips, dry granulation tissue on midline  Lab Results:   Recent Labs  05/06/15 0353 05/07/15 0514  WBC 9.7 8.6  HGB 10.2* 10.0*  HCT 32.8* 32.5*  PLT 256 258   BMET  Recent Labs  05/06/15 0353 05/07/15 0514  NA 140 140  K 3.3* 3.9  CL 107 104  CO2 21* 26  GLUCOSE 86 91  BUN 10 9  CREATININE 0.98 1.07*  CALCIUM 8.4* 8.8*   PT/INR No results for input(s): LABPROT, INR in the last 72 hours. ABG No results for input(s): PHART, HCO3 in the last 72 hours.  Invalid input(s): PCO2, PO2  Studies/Results: No results found.  Anti-infectives: Anti-infectives    Start     Dose/Rate Route Frequency Ordered Stop   05/02/15 1115  piperacillin-tazobactam (ZOSYN) IVPB 3.375 g     3.375 g 100 mL/hr over 30 Minutes Intravenous  Once 05/02/15 1100 05/02/15 1210      Assessment/Plan: s/p Procedure(s): LAPAROSCOPIC CHOLECYSTECTOMY (N/A)  LFTs improved OK for D/C I spoke with her husband as well We will put appointment in D/C navigator  LOS: 5 days    Desmon Hitchner E 05/07/2015

## 2015-05-07 NOTE — Progress Notes (Signed)
Reviewed discharge instructions with patient, patient acknowledges understanding of instructions and follow up appointments. She is assisted to her family vehicle by staff.

## 2015-05-08 ENCOUNTER — Telehealth: Payer: Self-pay | Admitting: Internal Medicine

## 2015-05-08 NOTE — Telephone Encounter (Signed)
Yes, that is fine. 

## 2015-05-08 NOTE — Telephone Encounter (Signed)
Pt was d/c from the hosp on 4/9 and the hospital would like for her to be seen within 2 weeks. Can we open up a 30 min slot?

## 2015-05-09 ENCOUNTER — Encounter (HOSPITAL_COMMUNITY): Payer: Self-pay | Admitting: General Surgery

## 2015-05-10 ENCOUNTER — Telehealth: Payer: Self-pay | Admitting: *Deleted

## 2015-05-10 NOTE — Telephone Encounter (Signed)
Transition Care Management Follow-up Telephone Call  How have you been since you were released from the hospital? A little sore.   Do you understand why you were in the hospital? yes   Do you understand the discharge instrcutions? yes  Items Reviewed:  Medications reviewed: yes  Allergies reviewed: yes  Dietary changes reviewed: yes  Referrals reviewed: yes   Functional Questionnaire:   Activities of Daily Living (ADLs):   She states they are independent in the following: ambulation, bathing and hygiene, feeding, continence, grooming, toileting and dressing States they require assistance with the following: none   Any transportation issues/concerns?: no   Any patient concerns? no   Confirmed importance and date/time of follow-up visits scheduled: yes   Confirmed with patient if condition begins to worsen call PCP or go to the ER.  Patient was given the Call-a-Nurse line [phone]: yes  patient was discharged 05/07/15  patient was discharged to her home.  patient has an appointment 05/17/15  With Dr Raliegh Ip

## 2015-05-17 ENCOUNTER — Ambulatory Visit (INDEPENDENT_AMBULATORY_CARE_PROVIDER_SITE_OTHER): Payer: Medicare Other | Admitting: Internal Medicine

## 2015-05-17 ENCOUNTER — Encounter: Payer: Self-pay | Admitting: Internal Medicine

## 2015-05-17 VITALS — BP 160/76 | HR 66 | Temp 97.9°F | Resp 18 | Ht <= 58 in | Wt 123.0 lb

## 2015-05-17 DIAGNOSIS — R911 Solitary pulmonary nodule: Secondary | ICD-10-CM

## 2015-05-17 DIAGNOSIS — I429 Cardiomyopathy, unspecified: Secondary | ICD-10-CM | POA: Diagnosis not present

## 2015-05-17 DIAGNOSIS — I10 Essential (primary) hypertension: Secondary | ICD-10-CM | POA: Diagnosis not present

## 2015-05-17 DIAGNOSIS — K851 Biliary acute pancreatitis without necrosis or infection: Secondary | ICD-10-CM

## 2015-05-17 DIAGNOSIS — I428 Other cardiomyopathies: Secondary | ICD-10-CM

## 2015-05-17 NOTE — Progress Notes (Signed)
Pre visit review using our clinic review tool, if applicable. No additional management support is needed unless otherwise documented below in the visit note. 

## 2015-05-17 NOTE — Progress Notes (Signed)
Subjective:    Patient ID: Sally Douglas, female    DOB: [birth year], 70 y.o.   MRN: [id number]  HPI  Admit date: 05/02/2015 Discharge date: 05/07/2015   DISCHARGE DIAGNOSES:  Principal Problem:  Acute gallstone pancreatitis Active Problems:  Essential hypertension  Nonischemic cardiomyopathy (HCC)  Pulmonary nodule  Pancreatitis  Malnutrition of moderate degree  70 year old patient who is seen today following a recent hospital discharge and for TCM. She was hospitalized for acute cholecystitis and gallstone pancreatitis.  She is status post laparoscopic cholecystectomy.  She has done quite well postoperatively and has slowly resumed her normal activities She generally feels well.  She is scheduled for surgical follow-up next week She has essential hypertension. She has been followed by pulmonary medicine for a solitary pulmonary nodule.  Patient is scheduled for follow-up chest CT in about 6 months.  She states that she has been a nonsmoker since her hospital admission for perforated ulcer in September 2016  Past Medical History  Diagnosis Date  . Insomnia     takes Ambien nightly  . Obesity   . Menopausal syndrome   . Tobacco abuse   . Low back pain   . Bronchitis     hx of-many yrs ago  . Abnormal finding on Pap smear     hx abnl pap  . Hypertension     takes Diltiazem and HCTZ daily  . Constipation     takes Miralax daily  . Arthritis   . Joint pain   . Joint swelling   . Perforated gastric ulcer ([place])      Social History   Social History  . Marital Status: Married    Spouse Name: N/A  . Number of Children: N/A  . Years of Education: N/A   Occupational History  . retired    Social History Main Topics  . Smoking status: Former Smoker -- 0.25 packs/day for 25 years    Types: Cigarettes    Quit date: 10/16/2014  . Smokeless tobacco: Never Used     Comment: Used Electronic Cigarettes until quit  . Alcohol Use: No  . Drug Use: No  . Sexual  Activity: Not Currently    Birth Control/ Protection: Post-menopausal   Other Topics Concern  . Not on file   Social History Narrative   gavidia 1   Para 1    aborta 0    Past Surgical History  Procedure Laterality Date  . Tonsillectomy    . Dilation and curettage of uterus    . Ventral hernia repair  01/24/2011    Procedure: LAPAROSCOPIC VENTRAL HERNIA;  Surgeon: Adin Hector, MD;  [facility];  Service: General;  Laterality: N/A;  with Mesh  . Maximum access (mas)posterior lumbar interbody fusion (plif) 1 level N/A 11/04/2012    Procedure: Lumbar four-five Maximum access posterior lumbar interbody fusion with Nuvasive;  Surgeon: Eustace Moore, MD;  Location: [place] [facility];  Service: Neurosurgery;  Laterality: N/A;  Lumbar four-five Maximum access posterior lumbar interbody fusion with Nuvasive  . Laparotomy N/A 10/16/2014    Procedure: EXPLORATORY LAPAROTOMY, REPAIR OF GASTRIC ULCER;  Surgeon: Georganna Skeans, MD;  Location: [place];  Service: General;  Laterality: N/A;  . Cholecystectomy N/A 05/05/2015    Procedure: LAPAROSCOPIC CHOLECYSTECTOMY;  Surgeon: Rolm Bookbinder, MD;  Location: [facility];  Service: General;  Laterality: N/A;    Family History  Problem Relation Age of Onset  . Cancer Brother     renal ,also sister  .  COPD Brother   . Hypertension Brother   . Diabetes Brother   . Kidney disease Brother   . Diabetes Brother     and sister  . Breast cancer      sister  . Kidney disease    . Cancer Mother     Breast Cancer  . Diabetes Mother   . Hypertension Mother   . Glaucoma Mother   . Diabetes Sister   . Hypertension Sister   . Heart disease Sister     Allergies  Allergen Reactions  . K-Dur [Potassium Chloride] Swelling    Cannot tolerate Potassium in any prepared form. Causes Face Swelling  . Nsaids Other (See Comments)    H/o PUD with perforation    Current Outpatient Prescriptions on File Prior to Visit  Medication Sig Dispense Refill  .  bifidobacterium infantis (ALIGN) capsule Take 1 capsule by mouth daily.    [name] diltiazem (CARDIZEM CD) 360 MG 24 hr capsule Take 360 mg by mouth daily.  3  . feeding supplement, ENSURE ENLIVE, (ENSURE ENLIVE) LIQD Take 237 mLs by mouth 2 (two) times daily between meals. 237 mL 12  . furosemide (LASIX) 20 MG tablet Take 20 mg by mouth daily. Reported on 05/10/2015  1  . HYDROcodone-acetaminophen (NORCO) 10-325 MG per tablet Take 0.5-2 tablets by mouth every 6 (six) hours as needed for moderate pain or severe pain (Prescribed by Dr. Maryjean Ka). 40 tablet 0  . omeprazole (PRILOSEC) 40 MG capsule Take 40 mg by mouth daily.  12  . polyethylene glycol (MIRALAX / GLYCOLAX) packet Take 17 g by mouth daily as needed for mild constipation.     . VOLTAREN 1 % GEL Apply 1 application topically daily as needed. FOR HANDS  2  . zolpidem (AMBIEN) 10 MG tablet Take 1 tablet (10 mg total) by mouth at bedtime as needed for sleep. 30 tablet 2   No current facility-administered medications on file prior to visit.    BP 160/76 mmHg  Pulse 66  Temp(Src) 97.9 F (36.6 C) (Oral)  Resp 18  Ht 4\' 8"  (1.422 m)  Wt 123 lb (55.792 kg)  BMI 27.59 kg/m2  SpO2 98%     Review of Systems  Constitutional: Negative.   HENT: Negative for congestion, dental problem, hearing loss, rhinorrhea, sinus pressure, sore throat and tinnitus.   Eyes: Negative for pain, discharge and visual disturbance.  Respiratory: Negative for cough and shortness of breath.   Cardiovascular: Negative for chest pain, palpitations and leg swelling.  Gastrointestinal: Negative for nausea, vomiting, abdominal pain, diarrhea, constipation, blood in stool and abdominal distention.  Genitourinary: Negative for dysuria, urgency, frequency, hematuria, flank pain, vaginal bleeding, vaginal discharge, difficulty urinating, vaginal pain and pelvic pain.  Musculoskeletal: Negative for joint swelling, arthralgias and gait problem.  Skin: Negative for rash.    Neurological: Negative for dizziness, syncope, speech difficulty, weakness, numbness and headaches.  Hematological: Negative for adenopathy.  Psychiatric/Behavioral: Negative for behavioral problems, dysphoric mood and agitation. The patient is not nervous/anxious.        Objective:   Physical Exam  Constitutional: She is oriented to person, place, and time. She appears well-developed and well-nourished.  Repeat blood pressure 134/76  HENT:  Head: Normocephalic.  Right Ear: External ear normal.  Left Ear: External ear normal.  Mouth/Throat: Oropharynx is clear and moist.  Eyes: Conjunctivae and EOM are normal. Pupils are equal, round, and reactive to light.  Neck: Normal range of motion. Neck supple. No thyromegaly present.  Cardiovascular: Normal rate, regular rhythm, normal heart sounds and intact distal pulses.   Pulmonary/Chest: Effort normal and breath sounds normal.  Abdominal: Soft. Bowel sounds are normal. She exhibits no mass. There is no tenderness.  Nicely healing laparoscopic scars with Steri-Strips still in place  Musculoskeletal: Normal range of motion. She exhibits no edema.  Lymphadenopathy:    She has no cervical adenopathy.  Neurological: She is alert and oriented to person, place, and time.  Skin: Skin is warm and dry. No rash noted.  Psychiatric: She has a normal mood and affect. Her behavior is normal.          Assessment & Plan:   Hypertension.  Continue present therapy Status post cholecystectomy for cholecystitis and gallstone pancreatitis.  Stable.  General surgical follow-up as scheduled History nonischemic car myopathy, stable History low back pain.  No complaints today History of solitary pulmonary nodule.  Follow-up chest CT later this year as scheduled.  Recheck 6 months or as needed Continue home blood pressure monitoring and low-salt diet

## 2015-05-17 NOTE — Patient Instructions (Signed)
Limit your sodium (Salt) intake  Please check your blood pressure on a regular basis.  If it is consistently greater than 150/90, please make an office appointment.    It is important that you exercise regularly, at least 20 minutes 3 to 4 times per week.  If you develop chest pain or shortness of breath seek  medical attention.  Return in 6 months for follow-up  

## 2015-06-01 ENCOUNTER — Telehealth: Payer: Self-pay | Admitting: Internal Medicine

## 2015-06-01 MED ORDER — DILTIAZEM HCL ER COATED BEADS 360 MG PO CP24: 360.0000 mg | ORAL_CAPSULE | Freq: Every day | ORAL | Status: DC

## 2015-06-01 NOTE — Telephone Encounter (Signed)
Pt request refill of the following: diltiazem (CARDIZEM CD) 360 MG 24 hr capsule   Phamacy:  [facility] [address] [place] [place]

## 2015-06-01 NOTE — Telephone Encounter (Signed)
Rx sent to pharmacy   

## 2015-06-13 ENCOUNTER — Ambulatory Visit: Payer: Medicare Other | Admitting: Internal Medicine

## 2015-07-26 DIAGNOSIS — I10 Essential (primary) hypertension: Secondary | ICD-10-CM | POA: Diagnosis not present

## 2015-07-26 DIAGNOSIS — Z6828 Body mass index (BMI) 28.0-28.9, adult: Secondary | ICD-10-CM | POA: Diagnosis not present

## 2015-07-26 DIAGNOSIS — M4806 Spinal stenosis, lumbar region: Secondary | ICD-10-CM | POA: Diagnosis not present

## 2015-07-26 DIAGNOSIS — M545 Low back pain: Secondary | ICD-10-CM | POA: Diagnosis not present

## 2015-08-03 ENCOUNTER — Telehealth: Payer: Self-pay | Admitting: Internal Medicine

## 2015-08-03 NOTE — Telephone Encounter (Signed)
Pt needs a refill on zolpidem 10 mg #30 w/refills sent to [facility] [address] in [place]

## 2015-08-04 ENCOUNTER — Other Ambulatory Visit: Payer: Self-pay | Admitting: Internal Medicine

## 2015-08-04 NOTE — Telephone Encounter (Signed)
Pt calling to see if Rx was called in for Zolpidem    Pharm:  [facility] [place]

## 2015-08-04 NOTE — Telephone Encounter (Signed)
Rx called in to pharmacy. 

## 2015-08-22 ENCOUNTER — Ambulatory Visit: Payer: Medicare Other | Admitting: Family Medicine

## 2015-08-29 ENCOUNTER — Ambulatory Visit: Payer: Medicare Other

## 2015-08-30 ENCOUNTER — Inpatient Hospital Stay: Admission: RE | Admit: 2015-08-30 | Payer: Medicare Other | Source: Ambulatory Visit

## 2015-09-01 ENCOUNTER — Ambulatory Visit (INDEPENDENT_AMBULATORY_CARE_PROVIDER_SITE_OTHER)
Admission: RE | Admit: 2015-09-01 | Discharge: 2015-09-01 | Disposition: A | Discharge status: Home / Self Care | Payer: Medicare Other | Source: Ambulatory Visit | Attending: Pulmonary Disease | Admitting: Pulmonary Disease

## 2015-09-01 DIAGNOSIS — R911 Solitary pulmonary nodule: Secondary | ICD-10-CM

## 2015-09-08 ENCOUNTER — Telehealth: Payer: Self-pay | Admitting: Pulmonary Disease

## 2015-09-08 DIAGNOSIS — R911 Solitary pulmonary nodule: Secondary | ICD-10-CM

## 2015-09-08 NOTE — Telephone Encounter (Signed)
CT chest 09/01/15 >> no change 10 mm RLL nodule   Will have my nurse inform pt that CT chest is stable.  She needs ROV to discuss in more detail.

## 2015-09-08 NOTE — Telephone Encounter (Signed)
Pt aware of results - pt did not want to schedule a visit at this time. Pt requests that Dr Halford Chessman elaborate on results in telephone message or contact her to discuss via telephone if able. Please advise Dr Halford Chessman. Thanks.

## 2015-09-21 NOTE — Telephone Encounter (Signed)
Please schedule her for follow up CT chest w/o contrast for August 2018.  She can arrange for ROV at that time.

## 2015-09-27 NOTE — Telephone Encounter (Signed)
Pt aware of results per VS Aware that we will order a follow up CT w/o contrast in August 2018.  Nothing further needed.

## 2015-10-04 ENCOUNTER — Telehealth: Payer: Self-pay | Admitting: Internal Medicine

## 2015-10-04 DIAGNOSIS — M25512 Pain in left shoulder: Secondary | ICD-10-CM

## 2015-10-04 DIAGNOSIS — W19XXXA Unspecified fall, initial encounter: Secondary | ICD-10-CM

## 2015-10-04 NOTE — Telephone Encounter (Signed)
Discussed with Dr.K okay to send referral to Ortho for pt.  Called pt, asked her which shoulder? Pt said she fell last night and hurt left shoulder. Told her order for referral to Ortho was done and someone will contact her to schedule. Pt verbalized understanding.

## 2015-10-04 NOTE — Telephone Encounter (Addendum)
Pt states she fell last night and hurt her shoulder.  Pt wants to know if she should come in here first, or go to an orthopedic. Pt does not want to come in and them have to go somewhere else.   Pt states she cannot lift her arm.  Her husband had surgery this mornign and she cannot leave him for 24 hrs. Hopes tomorrow she can go somewhere, wherever you advise. Pt would like a call back to advise.

## 2015-10-05 ENCOUNTER — Ambulatory Visit
Admission: RE | Admit: 2015-10-05 | Discharge: 2015-10-05 | Disposition: A | Discharge status: Home / Self Care | Payer: Medicare Other | Source: Ambulatory Visit | Attending: Orthopedic Surgery | Admitting: Orthopedic Surgery

## 2015-10-05 ENCOUNTER — Other Ambulatory Visit: Payer: Self-pay | Admitting: Orthopedic Surgery

## 2015-10-05 DIAGNOSIS — M25512 Pain in left shoulder: Secondary | ICD-10-CM

## 2015-10-05 DIAGNOSIS — S42295D Other nondisplaced fracture of upper end of left humerus, subsequent encounter for fracture with routine healing: Secondary | ICD-10-CM | POA: Diagnosis not present

## 2015-10-05 DIAGNOSIS — S42211A Unspecified displaced fracture of surgical neck of right humerus, initial encounter for closed fracture: Secondary | ICD-10-CM | POA: Diagnosis not present

## 2015-10-05 DIAGNOSIS — R937 Abnormal findings on diagnostic imaging of other parts of musculoskeletal system: Secondary | ICD-10-CM | POA: Diagnosis not present

## 2015-10-11 DIAGNOSIS — S42295D Other nondisplaced fracture of upper end of left humerus, subsequent encounter for fracture with routine healing: Secondary | ICD-10-CM | POA: Diagnosis not present

## 2015-10-12 ENCOUNTER — Other Ambulatory Visit: Payer: Medicare Other

## 2015-10-18 DIAGNOSIS — S42295D Other nondisplaced fracture of upper end of left humerus, subsequent encounter for fracture with routine healing: Secondary | ICD-10-CM | POA: Diagnosis not present

## 2015-10-23 DIAGNOSIS — M545 Low back pain: Secondary | ICD-10-CM | POA: Diagnosis not present

## 2015-10-23 DIAGNOSIS — M4806 Spinal stenosis, lumbar region: Secondary | ICD-10-CM | POA: Diagnosis not present

## 2015-10-31 ENCOUNTER — Other Ambulatory Visit: Payer: Self-pay | Admitting: Internal Medicine

## 2015-11-15 ENCOUNTER — Encounter: Payer: Self-pay | Admitting: Internal Medicine

## 2015-11-15 ENCOUNTER — Ambulatory Visit (INDEPENDENT_AMBULATORY_CARE_PROVIDER_SITE_OTHER): Payer: Medicare Other | Admitting: Internal Medicine

## 2015-11-15 VITALS — BP 152/80 | HR 67 | Temp 98.0°F | Wt 142.0 lb

## 2015-11-15 DIAGNOSIS — I10 Essential (primary) hypertension: Secondary | ICD-10-CM

## 2015-11-15 DIAGNOSIS — Z23 Encounter for immunization: Secondary | ICD-10-CM

## 2015-11-15 DIAGNOSIS — I428 Other cardiomyopathies: Secondary | ICD-10-CM | POA: Diagnosis not present

## 2015-11-15 DIAGNOSIS — S42295D Other nondisplaced fracture of upper end of left humerus, subsequent encounter for fracture with routine healing: Secondary | ICD-10-CM | POA: Diagnosis not present

## 2015-11-15 DIAGNOSIS — F17200 Nicotine dependence, unspecified, uncomplicated: Secondary | ICD-10-CM | POA: Diagnosis not present

## 2015-11-15 NOTE — Patient Instructions (Signed)
Limit your sodium (Salt) intake    It is important that you exercise regularly, at least 20 minutes 3 to 4 times per week.  If you develop chest pain or shortness of breath seek  medical attention.  Smoking tobacco is very bad for your health. You should stop smoking immediately.  Return in 6 months for follow-up  

## 2015-11-15 NOTE — Progress Notes (Signed)
Subjective:    Patient ID: Sally Douglas, female    DOB: 1945/04/09, 70 y.o.   MRN: [id number]  HPI   70 year old patient who is seen today for follow-up.  She has essential hypertension and also a history of nonischemic cardiomyopathy.  She has a history of COPD and ongoing tobacco use.  Doing quite well clinically Has had some situational stress related to the recent death of her sister from cardiopulmonary complications  She is presently followed by orthopedics after a traumatic left humeral fracture  Past Medical History:  Diagnosis Date  . Abnormal finding on Pap smear    hx abnl pap  . Arthritis   . Bronchitis    hx of-many yrs ago  . Constipation    takes Miralax daily  . Hypertension    takes Diltiazem and HCTZ daily  . Insomnia    takes Ambien nightly  . Joint pain   . Joint swelling   . Low back pain   . Menopausal syndrome   . Obesity   . Perforated gastric ulcer ([place])   . Tobacco abuse      Social History   Social History  . Marital status: Married    Spouse name: N/A  . Number of children: N/A  . Years of education: N/A   Occupational History  . retired    Social History Main Topics  . Smoking status: Former Smoker    Packs/day: 0.25    Years: 25.00    Types: Cigarettes    Quit date: 10/16/2014  . Smokeless tobacco: Never Used     Comment: Used Electronic Cigarettes until quit  . Alcohol use No  . Drug use: No  . Sexual activity: Not Currently    Birth control/ protection: Post-menopausal   Other Topics Concern  . Not on file   Social History Narrative   gavidia 1   Para 1    aborta 0    Past Surgical History:  Procedure Laterality Date  . CHOLECYSTECTOMY N/A 05/05/2015   Procedure: LAPAROSCOPIC CHOLECYSTECTOMY;  Surgeon: Rolm Bookbinder, MD;  Location: [place];  Service: General;  Laterality: N/A;  . DILATION AND CURETTAGE OF UTERUS    . LAPAROTOMY N/A 10/16/2014   Procedure: EXPLORATORY LAPAROTOMY, REPAIR OF GASTRIC ULCER;  Surgeon:  Georganna Skeans, MD;  Location: [place];  Service: General;  Laterality: N/A;  . MAXIMUM ACCESS (MAS)POSTERIOR LUMBAR INTERBODY FUSION (PLIF) 1 LEVEL N/A 11/04/2012   Procedure: Lumbar four-five Maximum access posterior lumbar interbody fusion with Harlin Heys;  Surgeon: Eustace Moore, MD;  Location: [place] [facility];  Service: Neurosurgery;  Laterality: N/A;  Lumbar four-five Maximum access posterior lumbar interbody fusion with Nuvasive  . TONSILLECTOMY    . VENTRAL HERNIA REPAIR  01/24/2011   Procedure: LAPAROSCOPIC VENTRAL HERNIA;  Surgeon: Adin Hector, MD;  [facility];  Service: General;  Laterality: N/A;  with Mesh    Family History  Problem Relation Age of Onset  . Cancer Brother     renal ,also sister  . COPD Brother   . Hypertension Brother   . Diabetes Brother   . Kidney disease Brother   . Diabetes Brother     and sister  . Breast cancer      sister  . Kidney disease    . Cancer Mother     Breast Cancer  . Diabetes Mother   . Hypertension Mother   . Glaucoma Mother   . Diabetes Sister   . Hypertension Sister   .  Heart disease Sister     Allergies  Allergen Reactions  . K-Dur [Potassium Chloride] Swelling    Cannot tolerate Potassium in any prepared form. Causes Face Swelling  . Nsaids Other (See Comments)    H/o PUD with perforation    Current Outpatient Prescriptions on File Prior to Visit  Medication Sig Dispense Refill  . bifidobacterium infantis (ALIGN) capsule Take 1 capsule by mouth daily.    [name] diltiazem (CARDIZEM CD) 360 MG 24 hr capsule Take 1 capsule (360 mg total) by mouth daily. 90 capsule 1  . furosemide (LASIX) 20 MG tablet Take 20 mg by mouth daily. Reported on 05/10/2015  1  . HYDROcodone-acetaminophen (NORCO) 10-325 MG per tablet Take 0.5-2 tablets by mouth every 6 (six) hours as needed for moderate pain or severe pain (Prescribed by Dr. Maryjean Ka). 40 tablet 0  . omeprazole (PRILOSEC) 40 MG capsule Take 40 mg by mouth daily.  12  . polyethylene  glycol (MIRALAX / GLYCOLAX) packet Take 17 g by mouth daily as needed for mild constipation.     . VOLTAREN 1 % GEL Apply 1 application topically daily as needed. FOR HANDS  2  . zolpidem (AMBIEN) 10 MG tablet TAKE ONE TABLET BY MOUTH AT BEDTIME AS NEEDED FOR SLEEP 30 tablet 5   No current facility-administered medications on file prior to visit.     BP (!) 152/80 (BP Location: Right Arm, Patient Position: Sitting, Cuff Size: Normal)   Pulse 67   Temp 98 F (36.7 C) (Oral)   Wt 142 lb (64.4 kg)   SpO2 97%   BMI 31.84 kg/m     Review of Systems  Constitutional: Negative.   HENT: Negative for congestion, dental problem, hearing loss, rhinorrhea, sinus pressure, sore throat and tinnitus.   Eyes: Negative for pain, discharge and visual disturbance.  Respiratory: Negative for cough and shortness of breath.   Cardiovascular: Negative for chest pain, palpitations and leg swelling.  Gastrointestinal: Negative for abdominal distention, abdominal pain, blood in stool, constipation, diarrhea, nausea and vomiting.  Genitourinary: Negative for difficulty urinating, dysuria, flank pain, frequency, hematuria, pelvic pain, urgency, vaginal bleeding, vaginal discharge and vaginal pain.  Musculoskeletal: Negative for arthralgias, gait problem and joint swelling.  Skin: Negative for rash.  Neurological: Negative for dizziness, syncope, speech difficulty, weakness, numbness and headaches.  Hematological: Negative for adenopathy.  Psychiatric/Behavioral: Negative for agitation, behavioral problems and dysphoric mood. The patient is not nervous/anxious.        Objective:   Physical Exam  Constitutional: She is oriented to person, place, and time. She appears well-developed and well-nourished.  Blood pressure 140/82  HENT:  Head: Normocephalic.  Right Ear: External ear normal.  Left Ear: External ear normal.  Mouth/Throat: Oropharynx is clear and moist.  Eyes: Conjunctivae and EOM are normal.  Pupils are equal, round, and reactive to light.  Neck: Normal range of motion. Neck supple. No thyromegaly present.  Cardiovascular: Normal rate, regular rhythm, normal heart sounds and intact distal pulses.   Pulmonary/Chest: Effort normal and breath sounds normal.  Abdominal: Soft. Bowel sounds are normal. She exhibits no mass. There is no tenderness.  Musculoskeletal: Normal range of motion.  Left arm in a sling  Lymphadenopathy:    She has no cervical adenopathy.  Neurological: She is alert and oriented to person, place, and time.  Skin: Skin is warm and dry. No rash noted.  Psychiatric: She has a normal mood and affect. Her behavior is normal.  Assessment & Plan:   Essential hypertension, stable Nonischemic cardiomyopathy History of ongoing tobacco use.  Total smoking cessation encouraged  Flu vaccine administered  Recheck 6 months  Elaine Middleton Pilar Plate

## 2015-11-15 NOTE — Progress Notes (Signed)
Pre visit review using our clinic review tool, if applicable. No additional management support is needed unless otherwise documented below in the visit note. 

## 2015-11-24 ENCOUNTER — Other Ambulatory Visit: Payer: Self-pay | Admitting: Internal Medicine

## 2015-12-04 DIAGNOSIS — M25612 Stiffness of left shoulder, not elsewhere classified: Secondary | ICD-10-CM | POA: Diagnosis not present

## 2015-12-04 DIAGNOSIS — M6281 Muscle weakness (generalized): Secondary | ICD-10-CM | POA: Diagnosis not present

## 2015-12-04 DIAGNOSIS — M25512 Pain in left shoulder: Secondary | ICD-10-CM | POA: Diagnosis not present

## 2015-12-05 DIAGNOSIS — M25512 Pain in left shoulder: Secondary | ICD-10-CM | POA: Diagnosis not present

## 2015-12-05 DIAGNOSIS — M25612 Stiffness of left shoulder, not elsewhere classified: Secondary | ICD-10-CM | POA: Diagnosis not present

## 2015-12-05 DIAGNOSIS — M6281 Muscle weakness (generalized): Secondary | ICD-10-CM | POA: Diagnosis not present

## 2015-12-12 DIAGNOSIS — M25612 Stiffness of left shoulder, not elsewhere classified: Secondary | ICD-10-CM | POA: Diagnosis not present

## 2015-12-12 DIAGNOSIS — M6281 Muscle weakness (generalized): Secondary | ICD-10-CM | POA: Diagnosis not present

## 2015-12-12 DIAGNOSIS — M25512 Pain in left shoulder: Secondary | ICD-10-CM | POA: Diagnosis not present

## 2015-12-13 DIAGNOSIS — S42295D Other nondisplaced fracture of upper end of left humerus, subsequent encounter for fracture with routine healing: Secondary | ICD-10-CM | POA: Diagnosis not present

## 2015-12-14 DIAGNOSIS — M25512 Pain in left shoulder: Secondary | ICD-10-CM | POA: Diagnosis not present

## 2015-12-14 DIAGNOSIS — M6281 Muscle weakness (generalized): Secondary | ICD-10-CM | POA: Diagnosis not present

## 2015-12-14 DIAGNOSIS — M25612 Stiffness of left shoulder, not elsewhere classified: Secondary | ICD-10-CM | POA: Diagnosis not present

## 2015-12-19 DIAGNOSIS — M6281 Muscle weakness (generalized): Secondary | ICD-10-CM | POA: Diagnosis not present

## 2015-12-19 DIAGNOSIS — M25612 Stiffness of left shoulder, not elsewhere classified: Secondary | ICD-10-CM | POA: Diagnosis not present

## 2015-12-19 DIAGNOSIS — M25512 Pain in left shoulder: Secondary | ICD-10-CM | POA: Diagnosis not present

## 2015-12-26 DIAGNOSIS — M25512 Pain in left shoulder: Secondary | ICD-10-CM | POA: Diagnosis not present

## 2015-12-26 DIAGNOSIS — M25612 Stiffness of left shoulder, not elsewhere classified: Secondary | ICD-10-CM | POA: Diagnosis not present

## 2015-12-26 DIAGNOSIS — M6281 Muscle weakness (generalized): Secondary | ICD-10-CM | POA: Diagnosis not present

## 2016-01-03 DIAGNOSIS — M25612 Stiffness of left shoulder, not elsewhere classified: Secondary | ICD-10-CM | POA: Diagnosis not present

## 2016-01-03 DIAGNOSIS — M6281 Muscle weakness (generalized): Secondary | ICD-10-CM | POA: Diagnosis not present

## 2016-01-03 DIAGNOSIS — M25512 Pain in left shoulder: Secondary | ICD-10-CM | POA: Diagnosis not present

## 2016-01-09 DIAGNOSIS — I10 Essential (primary) hypertension: Secondary | ICD-10-CM | POA: Diagnosis not present

## 2016-01-09 DIAGNOSIS — M48061 Spinal stenosis, lumbar region without neurogenic claudication: Secondary | ICD-10-CM | POA: Diagnosis not present

## 2016-01-09 DIAGNOSIS — M545 Low back pain: Secondary | ICD-10-CM | POA: Diagnosis not present

## 2016-01-09 DIAGNOSIS — Z6832 Body mass index (BMI) 32.0-32.9, adult: Secondary | ICD-10-CM | POA: Diagnosis not present

## 2016-01-10 DIAGNOSIS — S42295D Other nondisplaced fracture of upper end of left humerus, subsequent encounter for fracture with routine healing: Secondary | ICD-10-CM | POA: Diagnosis not present

## 2016-02-07 DIAGNOSIS — M542 Cervicalgia: Secondary | ICD-10-CM | POA: Diagnosis not present

## 2016-02-07 DIAGNOSIS — S42295D Other nondisplaced fracture of upper end of left humerus, subsequent encounter for fracture with routine healing: Secondary | ICD-10-CM | POA: Diagnosis not present

## 2016-02-07 DIAGNOSIS — M25511 Pain in right shoulder: Secondary | ICD-10-CM | POA: Diagnosis not present

## 2016-03-06 ENCOUNTER — Other Ambulatory Visit: Payer: Self-pay | Admitting: Internal Medicine

## 2016-03-06 DIAGNOSIS — M25512 Pain in left shoulder: Secondary | ICD-10-CM | POA: Diagnosis not present

## 2016-04-04 DIAGNOSIS — I10 Essential (primary) hypertension: Secondary | ICD-10-CM | POA: Diagnosis not present

## 2016-04-04 DIAGNOSIS — M48061 Spinal stenosis, lumbar region without neurogenic claudication: Secondary | ICD-10-CM | POA: Diagnosis not present

## 2016-04-04 DIAGNOSIS — Z6832 Body mass index (BMI) 32.0-32.9, adult: Secondary | ICD-10-CM | POA: Diagnosis not present

## 2016-04-04 DIAGNOSIS — M545 Low back pain: Secondary | ICD-10-CM | POA: Diagnosis not present

## 2016-04-29 ENCOUNTER — Other Ambulatory Visit: Payer: Self-pay | Admitting: Internal Medicine

## 2016-05-17 ENCOUNTER — Ambulatory Visit: Payer: Medicare Other | Admitting: Internal Medicine

## 2016-05-20 ENCOUNTER — Ambulatory Visit (INDEPENDENT_AMBULATORY_CARE_PROVIDER_SITE_OTHER): Payer: Medicare Other | Admitting: Internal Medicine

## 2016-05-20 ENCOUNTER — Encounter: Payer: Self-pay | Admitting: Internal Medicine

## 2016-05-20 VITALS — BP 142/80 | Temp 97.9°F | Ht <= 58 in | Wt 150.0 lb

## 2016-05-20 DIAGNOSIS — F17200 Nicotine dependence, unspecified, uncomplicated: Secondary | ICD-10-CM

## 2016-05-20 DIAGNOSIS — I10 Essential (primary) hypertension: Secondary | ICD-10-CM | POA: Diagnosis not present

## 2016-05-20 DIAGNOSIS — I428 Other cardiomyopathies: Secondary | ICD-10-CM | POA: Diagnosis not present

## 2016-05-20 MED ORDER — DILTIAZEM HCL ER COATED BEADS 360 MG PO CP24: 360.0000 mg | ORAL_CAPSULE | Freq: Every day | ORAL | 1 refills | Status: DC

## 2016-05-20 MED ORDER — ZOLPIDEM TARTRATE 10 MG PO TABS: 10.0000 mg | ORAL_TABLET | Freq: Every evening | ORAL | 0 refills | Status: DC | PRN

## 2016-05-20 MED ORDER — DILTIAZEM HCL ER COATED BEADS 360 MG PO CP24: 360.0000 mg | ORAL_CAPSULE | Freq: Every day | ORAL | 4 refills | Status: DC

## 2016-05-20 NOTE — Patient Instructions (Signed)
Limit your sodium (Salt) intake    It is important that you exercise regularly, at least 20 minutes 3 to 4 times per week.  If you develop chest pain or shortness of breath seek  medical attention.  Return in 6 months for follow-up  Please check your blood pressure on a regular basis.  If it is consistently greater than 150/90, please make an office appointment.

## 2016-05-20 NOTE — Progress Notes (Signed)
Subjective:    Patient ID: Sally Douglas, female    DOB: 1945/11/18, 71 y.o.   MRN: [id number]  HPI 71 year old patient who is seen today for follow-up.  She has a history of essential hypertension. The cost of her diltiazem has increased to 36 dollars per month She remains off tobacco now for approximately one year She had a difficult time with a viral URI through the winter but today, doing well. She had follow-up chest CT in November of last year for a solitary pulmonary nodule that was unchanged.  Recommendation was to repeat again.  One final CT in September of this year  Past Medical History:  Diagnosis Date  . Abnormal finding on Pap smear    hx abnl pap  . Arthritis   . Bronchitis    hx of-many yrs ago  . Constipation    takes Miralax daily  . Hypertension    takes Diltiazem and HCTZ daily  . Insomnia    takes Ambien nightly  . Joint pain   . Joint swelling   . Low back pain   . Menopausal syndrome   . Obesity   . Perforated gastric ulcer ([place])   . Tobacco abuse      Social History   Social History  . Marital status: Married    Spouse name: N/A  . Number of children: N/A  . Years of education: N/A   Occupational History  . retired    Social History Main Topics  . Smoking status: Former Smoker    Packs/day: 0.25    Years: 25.00    Types: Cigarettes    Quit date: 10/16/2014  . Smokeless tobacco: Never Used     Comment: Used Electronic Cigarettes until quit  . Alcohol use No  . Drug use: No  . Sexual activity: Not Currently    Birth control/ protection: Post-menopausal   Other Topics Concern  . Not on file   Social History Narrative   gavidia 1   Para 1    aborta 0    Past Surgical History:  Procedure Laterality Date  . CHOLECYSTECTOMY N/A 05/05/2015   Procedure: LAPAROSCOPIC CHOLECYSTECTOMY;  Surgeon: Rolm Bookbinder, MD;  Location: [place];  Service: General;  Laterality: N/A;  . DILATION AND CURETTAGE OF UTERUS    . LAPAROTOMY N/A 10/16/2014    Procedure: EXPLORATORY LAPAROTOMY, REPAIR OF GASTRIC ULCER;  Surgeon: Georganna Skeans, MD;  Location: [place];  Service: General;  Laterality: N/A;  . MAXIMUM ACCESS (MAS)POSTERIOR LUMBAR INTERBODY FUSION (PLIF) 1 LEVEL N/A 11/04/2012   Procedure: Lumbar four-five Maximum access posterior lumbar interbody fusion with Harlin Heys;  Surgeon: Eustace Moore, MD;  Location: [place] [facility];  Service: Neurosurgery;  Laterality: N/A;  Lumbar four-five Maximum access posterior lumbar interbody fusion with Nuvasive  . TONSILLECTOMY    . VENTRAL HERNIA REPAIR  01/24/2011   Procedure: LAPAROSCOPIC VENTRAL HERNIA;  Surgeon: Adin Hector, MD;  [facility];  Service: General;  Laterality: N/A;  with Mesh    Family History  Problem Relation Age of Onset  . Cancer Brother     renal ,also sister  . COPD Brother   . Hypertension Brother   . Diabetes Brother   . Kidney disease Brother   . Diabetes Brother     and sister  . Breast cancer      sister  . Kidney disease    . Cancer Mother     Breast Cancer  . Diabetes Mother   .  Hypertension Mother   . Glaucoma Mother   . Diabetes Sister   . Hypertension Sister   . Heart disease Sister     Allergies  Allergen Reactions  . K-Dur [Potassium Chloride] Swelling    Cannot tolerate Potassium in any prepared form. Causes Face Swelling  . Nsaids Other (See Comments)    H/o PUD with perforation    Current Outpatient Prescriptions on File Prior to Visit  Medication Sig Dispense Refill  . bifidobacterium infantis (ALIGN) capsule Take 1 capsule by mouth daily.    [name] diltiazem (CARDIZEM CD) 360 MG 24 hr capsule TAKE 1 CAPSULE (360 MG TOTAL) BY MOUTH DAILY. 90 capsule 1  . furosemide (LASIX) 20 MG tablet Take 20 mg by mouth daily. Reported on 05/10/2015  1  . HYDROcodone-acetaminophen (NORCO) 10-325 MG per tablet Take 0.5-2 tablets by mouth every 6 (six) hours as needed for moderate pain or severe pain (Prescribed by Dr. Maryjean Ka). 40 tablet 0  . omeprazole  (PRILOSEC) 40 MG capsule TAKE 1 CAPSULE BY MOUTH DAILY 90 capsule 3  . polyethylene glycol (MIRALAX / GLYCOLAX) packet Take 17 g by mouth daily as needed for mild constipation.     . VOLTAREN 1 % GEL Apply 1 application topically daily as needed. FOR HANDS  2  . zolpidem (AMBIEN) 10 MG tablet TAKE ONE TABLET BY MOUTH AT BEDTIME AS NEEDED FOR SLEEP 30 tablet 0   No current facility-administered medications on file prior to visit.     BP (!) 142/80 (BP Location: Left Arm, Patient Position: Sitting, Cuff Size: Normal)   Temp 97.9 F (36.6 C) (Oral)   Ht 4\' 8"  (1.422 m)   Wt 150 lb (68 kg)   BMI 33.63 kg/m      Review of Systems  Constitutional: Negative.   HENT: Negative for congestion, dental problem, hearing loss, rhinorrhea, sinus pressure, sore throat and tinnitus.   Eyes: Negative for pain, discharge and visual disturbance.  Respiratory: Negative for cough and shortness of breath.   Cardiovascular: Negative for chest pain, palpitations and leg swelling.  Gastrointestinal: Negative for abdominal distention, abdominal pain, blood in stool, constipation, diarrhea, nausea and vomiting.  Genitourinary: Negative for difficulty urinating, dysuria, flank pain, frequency, hematuria, pelvic pain, urgency, vaginal bleeding, vaginal discharge and vaginal pain.  Musculoskeletal: Negative for arthralgias, gait problem and joint swelling.  Skin: Negative for rash.  Neurological: Negative for dizziness, syncope, speech difficulty, weakness, numbness and headaches.  Hematological: Negative for adenopathy.  Psychiatric/Behavioral: Negative for agitation, behavioral problems and dysphoric mood. The patient is not nervous/anxious.        Objective:   Physical Exam  Constitutional: She is oriented to person, place, and time. She appears well-developed and well-nourished.  Obese  No distress  Blood pressure 136/80  HENT:  Head: Normocephalic.  Right Ear: External ear normal.  Left Ear:  External ear normal.  Mouth/Throat: Oropharynx is clear and moist.  Eyes: Conjunctivae and EOM are normal. Pupils are equal, round, and reactive to light.  Neck: Normal range of motion. Neck supple. No thyromegaly present.  Cardiovascular: Normal rate, regular rhythm, normal heart sounds and intact distal pulses.   Pulmonary/Chest: Effort normal and breath sounds normal.  Abdominal: Soft. Bowel sounds are normal. She exhibits no mass. There is no tenderness.  Musculoskeletal: Normal range of motion.  Lymphadenopathy:    She has no cervical adenopathy.  Neurological: She is alert and oriented to person, place, and time.  Skin: Skin is warm and dry. No rash  noted.  Psychiatric: She has a normal mood and affect. Her behavior is normal.          Assessment & Plan:   Essential hypertension, well-controlled COPD stable Obesity.  Weight loss encouraged Insomnia.  Ambien refilled.  Daily Nighttime use discouraged  Follow-up 6 months  Hagan Vanauken Pilar Plate

## 2016-05-20 NOTE — Progress Notes (Signed)
Pre visit review using our clinic review tool, if applicable. No additional management support is needed unless otherwise documented below in the visit note. 

## 2016-05-26 ENCOUNTER — Other Ambulatory Visit: Payer: Self-pay | Admitting: Internal Medicine

## 2016-06-03 IMAGING — DX DG CHEST 2V
2 series · 2 of 2 positions shown · non-contrast
Comparison: October 18, 2014.

CLINICAL DATA: Hypertension.  History of perforated ulcer.

EXAM:
CHEST  2 VIEW

[chest pa]
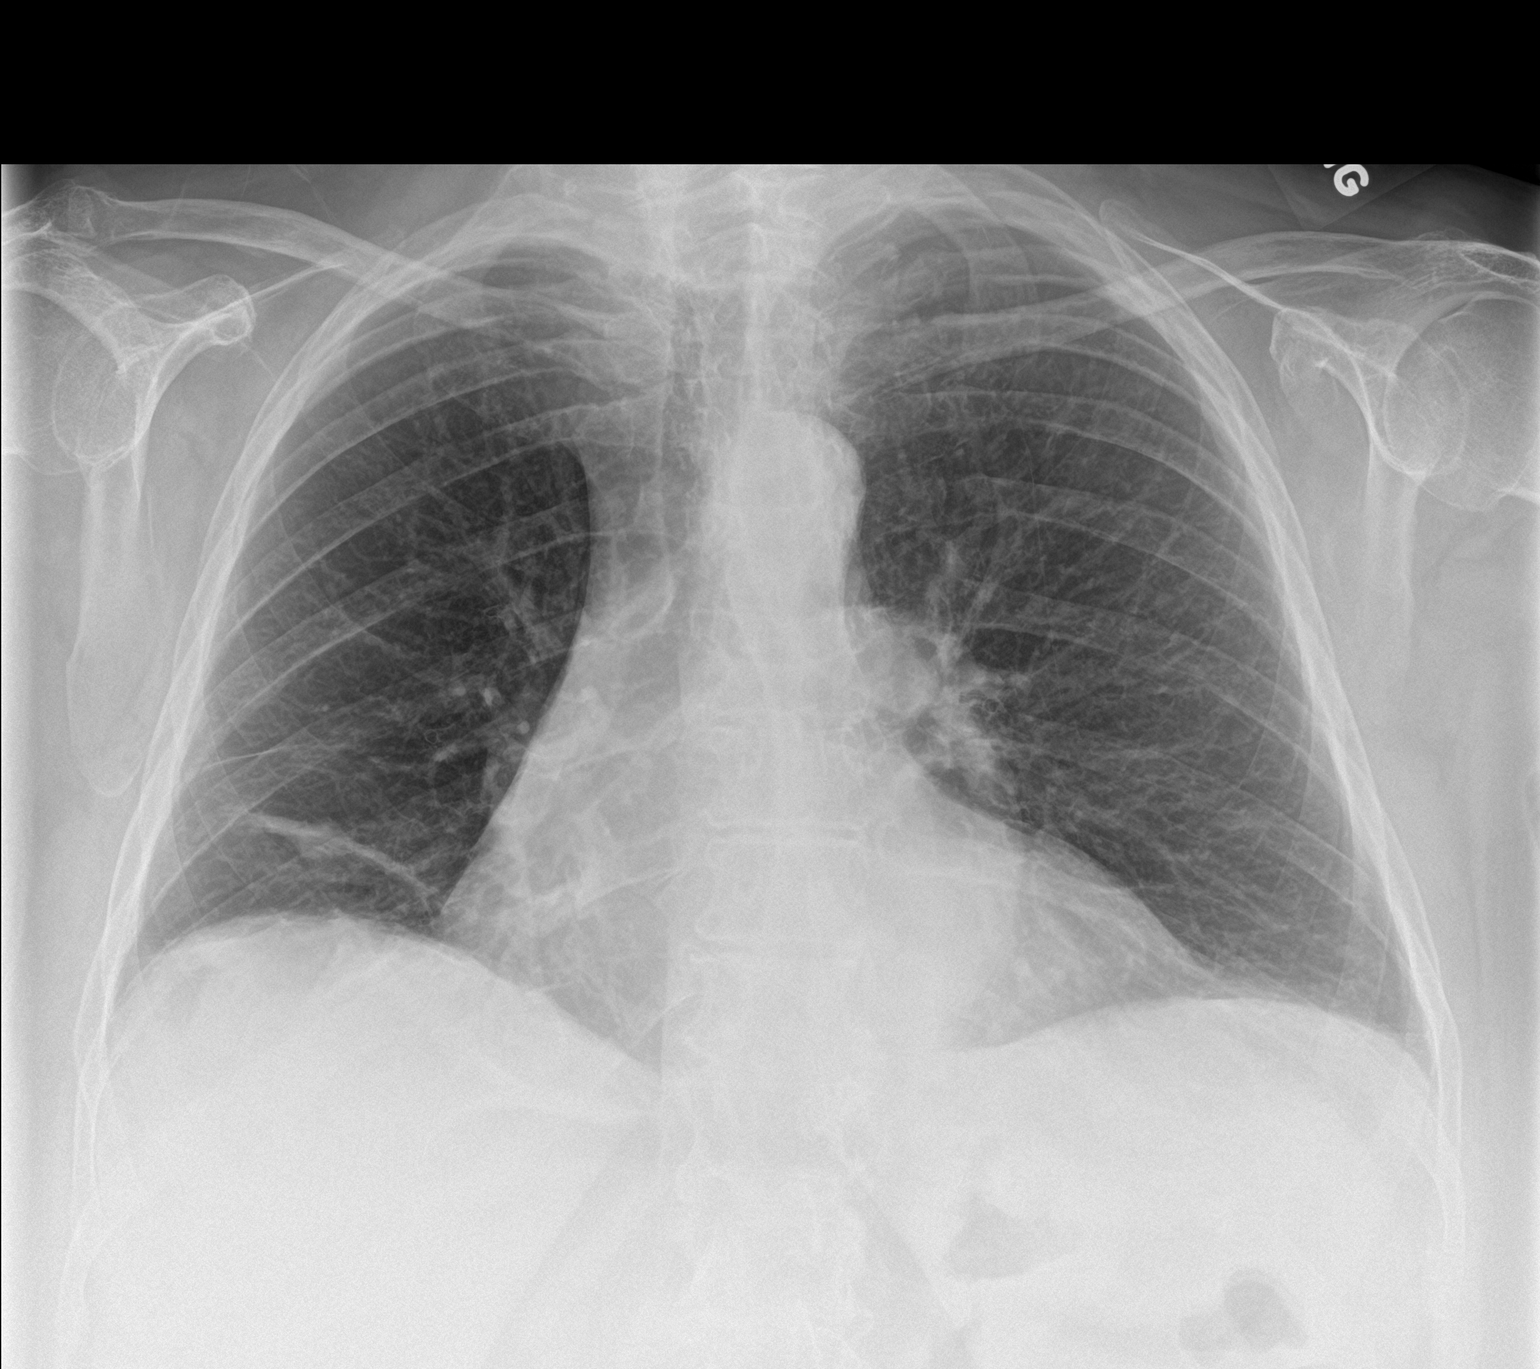

[chest lat]
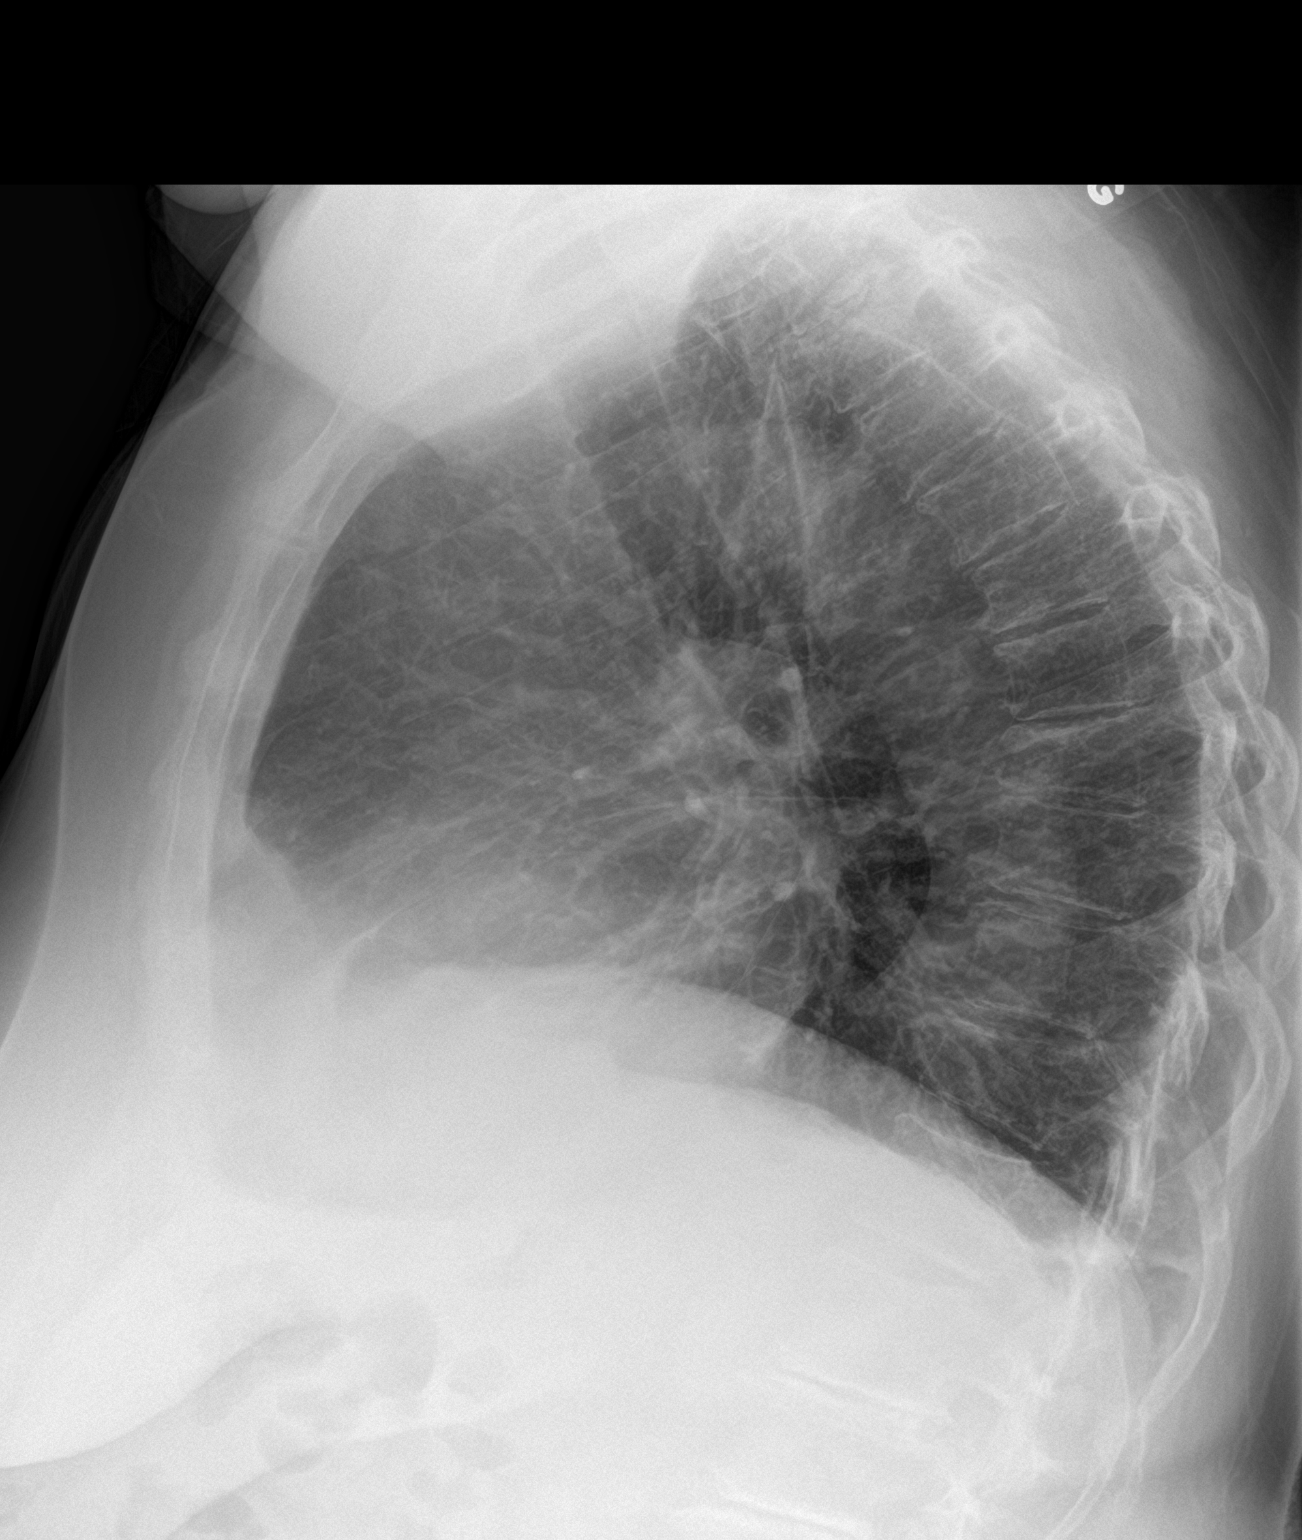

[2 of 2 positions shown; findings below may reference images not displayed]

FINDINGS: The heart size and mediastinal contours are within normal limits. No
pneumothorax or pleural effusion is noted. Left lung is clear. Mild
right basilar subsegmental atelectasis is noted. The visualized
skeletal structures are unremarkable.
IMPRESSION: Mild right basilar subsegmental atelectasis.

## 2016-06-30 ENCOUNTER — Other Ambulatory Visit: Payer: Self-pay | Admitting: Internal Medicine

## 2016-07-02 DIAGNOSIS — Z79891 Long term (current) use of opiate analgesic: Secondary | ICD-10-CM | POA: Diagnosis not present

## 2016-07-02 DIAGNOSIS — Z6833 Body mass index (BMI) 33.0-33.9, adult: Secondary | ICD-10-CM | POA: Diagnosis not present

## 2016-07-02 DIAGNOSIS — I10 Essential (primary) hypertension: Secondary | ICD-10-CM | POA: Diagnosis not present

## 2016-07-02 DIAGNOSIS — M545 Low back pain: Secondary | ICD-10-CM | POA: Diagnosis not present

## 2016-07-02 DIAGNOSIS — Z5181 Encounter for therapeutic drug level monitoring: Secondary | ICD-10-CM | POA: Diagnosis not present

## 2016-07-02 DIAGNOSIS — M48061 Spinal stenosis, lumbar region without neurogenic claudication: Secondary | ICD-10-CM | POA: Diagnosis not present

## 2016-07-02 DIAGNOSIS — Z79899 Other long term (current) drug therapy: Secondary | ICD-10-CM | POA: Diagnosis not present

## 2016-08-05 ENCOUNTER — Other Ambulatory Visit: Payer: Self-pay | Admitting: Internal Medicine

## 2016-08-29 ENCOUNTER — Inpatient Hospital Stay: Admission: RE | Admit: 2016-08-29 | Payer: Medicare Other | Source: Ambulatory Visit

## 2016-09-26 DIAGNOSIS — M48061 Spinal stenosis, lumbar region without neurogenic claudication: Secondary | ICD-10-CM | POA: Diagnosis not present

## 2016-09-26 DIAGNOSIS — M545 Low back pain: Secondary | ICD-10-CM | POA: Diagnosis not present

## 2016-09-26 DIAGNOSIS — I10 Essential (primary) hypertension: Secondary | ICD-10-CM | POA: Diagnosis not present

## 2016-09-26 DIAGNOSIS — Z6841 Body Mass Index (BMI) 40.0 and over, adult: Secondary | ICD-10-CM | POA: Diagnosis not present

## 2016-10-17 ENCOUNTER — Encounter: Payer: Self-pay | Admitting: Internal Medicine

## 2016-11-04 ENCOUNTER — Ambulatory Visit (INDEPENDENT_AMBULATORY_CARE_PROVIDER_SITE_OTHER)
Admission: RE | Admit: 2016-11-04 | Discharge: 2016-11-04 | Disposition: A | Discharge status: Home / Self Care | Payer: Medicare Other | Source: Ambulatory Visit | Attending: Pulmonary Disease | Admitting: Pulmonary Disease

## 2016-11-04 ENCOUNTER — Ambulatory Visit (INDEPENDENT_AMBULATORY_CARE_PROVIDER_SITE_OTHER): Payer: Medicare Other | Admitting: Internal Medicine

## 2016-11-04 ENCOUNTER — Encounter: Payer: Self-pay | Admitting: Internal Medicine

## 2016-11-04 VITALS — BP 142/80 | HR 71 | Temp 98.0°F | Ht <= 58 in | Wt 152.4 lb

## 2016-11-04 DIAGNOSIS — I428 Other cardiomyopathies: Secondary | ICD-10-CM | POA: Diagnosis not present

## 2016-11-04 DIAGNOSIS — R911 Solitary pulmonary nodule: Secondary | ICD-10-CM

## 2016-11-04 DIAGNOSIS — Z23 Encounter for immunization: Secondary | ICD-10-CM

## 2016-11-04 DIAGNOSIS — I10 Essential (primary) hypertension: Secondary | ICD-10-CM | POA: Diagnosis not present

## 2016-11-04 LAB — CBC WITH DIFFERENTIAL/PLATELET
BASOS ABS: 0.1 10*3/uL (ref 0.0–0.1)
Basophils Relative: 1.1 % (ref 0.0–3.0)
EOS PCT: 2.8 % (ref 0.0–5.0)
Eosinophils Absolute: 0.3 10*3/uL (ref 0.0–0.7)
HCT: 41.4 % (ref 36.0–46.0)
Hemoglobin: 13.5 g/dL (ref 12.0–15.0)
LYMPHS ABS: 3 10*3/uL (ref 0.7–4.0)
Lymphocytes Relative: 31.9 % (ref 12.0–46.0)
MCHC: 32.6 g/dL (ref 30.0–36.0)
MCV: 88 fl (ref 78.0–100.0)
MONOS PCT: 6.2 % (ref 3.0–12.0)
Monocytes Absolute: 0.6 10*3/uL (ref 0.1–1.0)
NEUTROS PCT: 58 % (ref 43.0–77.0)
Neutro Abs: 5.5 10*3/uL (ref 1.4–7.7)
PLATELETS: 302 10*3/uL (ref 150.0–400.0)
RBC: 4.7 Mil/uL (ref 3.87–5.11)
RDW: 14.9 % (ref 11.5–15.5)
WBC: 9.5 10*3/uL (ref 4.0–10.5)

## 2016-11-04 LAB — COMPREHENSIVE METABOLIC PANEL
ALT: 12 U/L (ref 0–35)
AST: 13 U/L (ref 0–37)
Albumin: 4.3 g/dL (ref 3.5–5.2)
Alkaline Phosphatase: 106 U/L (ref 39–117)
BILIRUBIN TOTAL: 0.4 mg/dL (ref 0.2–1.2)
BUN: 14 mg/dL (ref 6–23)
CO2: 28 meq/L (ref 19–32)
Calcium: 9.5 mg/dL (ref 8.4–10.5)
Chloride: 101 mEq/L (ref 96–112)
Creatinine, Ser: 0.99 mg/dL (ref 0.40–1.20)
GFR: 58.68 mL/min — AB (ref >60.00)
GLUCOSE: 94 mg/dL (ref 70–99)
Potassium: 4.1 mEq/L (ref 3.5–5.1)
Sodium: 139 mEq/L (ref 135–145)
Total Protein: 6.9 g/dL (ref 6.0–8.3)

## 2016-11-04 LAB — LIPID PANEL
CHOL/HDL RATIO: 3
Cholesterol: 166 mg/dL (ref 0–200)
HDL: 63.7 mg/dL (ref >39.00)
LDL Cholesterol: 76 mg/dL (ref 0–99)
NonHDL: 102.74
Triglycerides: 134 mg/dL (ref 0.0–149.0)
VLDL: 26.8 mg/dL (ref 0.0–40.0)

## 2016-11-04 LAB — TSH: TSH: 0.69 u[IU]/mL (ref 0.35–4.50)

## 2016-11-04 MED ORDER — AMLODIPINE BESYLATE 10 MG PO TABS: 10.0000 mg | ORAL_TABLET | Freq: Every day | ORAL | 6 refills | Status: DC

## 2016-11-04 NOTE — Patient Instructions (Addendum)
Limit your sodium (Salt) intake  Please check your blood pressure on a regular basis.  If it is consistently greater than 150/90, please make an office appointment.  Return in 6 months for follow-up for annual exam  You need to lose weight.  Consider a lower calorie diet and regular exercise.    It is important that you exercise regularly, at least 20 minutes 3 to 4 times per week.  If you develop chest pain or shortness of breath seek  medical attention.   Health Maintenance for Postmenopausal Women Menopause is a normal process in which your reproductive ability comes to an end. This process happens gradually over a span of months to years, usually between the ages of 48 and 48. Menopause is complete when you have missed 12 consecutive menstrual periods. It is important to talk with your health care provider about some of the most common conditions that affect postmenopausal women, such as heart disease, cancer, and bone loss (osteoporosis). Adopting a healthy lifestyle and getting preventive care can help to promote your health and wellness. Those actions can also lower your chances of developing some of these common conditions. What should I know about menopause? During menopause, you may experience a number of symptoms, such as:  Moderate-to-severe hot flashes.  Night sweats.  Decrease in sex drive.  Mood swings.  Headaches.  Tiredness.  Irritability.  Memory problems.  Insomnia.  Choosing to treat or not to treat menopausal changes is an individual decision that you make with your health care provider. What should I know about hormone replacement therapy and supplements? Hormone therapy products are effective for treating symptoms that are associated with menopause, such as hot flashes and night sweats. Hormone replacement carries certain risks, especially as you become older. If you are thinking about using estrogen or estrogen with progestin treatments, discuss the  benefits and risks with your health care provider. What should I know about heart disease and stroke? Heart disease, heart attack, and stroke become more likely as you age. This may be due, in part, to the hormonal changes that your body experiences during menopause. These can affect how your body processes dietary fats, triglycerides, and cholesterol. Heart attack and stroke are both medical emergencies. There are many things that you can do to help prevent heart disease and stroke:  Have your blood pressure checked at least every 1-2 years. High blood pressure causes heart disease and increases the risk of stroke.  If you are 32-5 years old, ask your health care provider if you should take aspirin to prevent a heart attack or a stroke.  Do not use any tobacco products, including cigarettes, chewing tobacco, or electronic cigarettes. If you need help quitting, ask your health care provider.  It is important to eat a healthy diet and maintain a healthy weight. ? Be sure to include plenty of vegetables, fruits, low-fat dairy products, and lean protein. ? Avoid eating foods that are high in solid fats, added sugars, or salt (sodium).  Get regular exercise. This is one of the most important things that you can do for your health. ? Try to exercise for at least 150 minutes each week. The type of exercise that you do should increase your heart rate and make you sweat. This is known as moderate-intensity exercise. ? Try to do strengthening exercises at least twice each week. Do these in addition to the moderate-intensity exercise.  Know your numbers.Ask your health care provider to check your cholesterol and your blood  glucose. Continue to have your blood tested as directed by your health care provider.  What should I know about cancer screening? There are several types of cancer. Take the following steps to reduce your risk and to catch any cancer development as early as possible. Breast  Cancer  Practice breast self-awareness. ? This means understanding how your breasts normally appear and feel. ? It also means doing regular breast self-exams. Let your health care provider know about any changes, no matter how small.  If you are 13 or older, have a clinician do a breast exam (clinical breast exam or CBE) every year. Depending on your age, family history, and medical history, it may be recommended that you also have a yearly breast X-ray (mammogram).  If you have a family history of breast cancer, talk with your health care provider about genetic screening.  If you are at high risk for breast cancer, talk with your health care provider about having an MRI and a mammogram every year.  Breast cancer (BRCA) gene test is recommended for women who have family members with BRCA-related cancers. Results of the assessment will determine the need for genetic counseling and BRCA1 and for BRCA2 testing. BRCA-related cancers include these types: ? Breast. This occurs in males or females. ? Ovarian. ? Tubal. This may also be called fallopian tube cancer. ? Cancer of the abdominal or pelvic lining (peritoneal cancer). ? Prostate. ? Pancreatic.  Cervical, Uterine, and Ovarian Cancer Your health care provider may recommend that you be screened regularly for cancer of the pelvic organs. These include your ovaries, uterus, and vagina. This screening involves a pelvic exam, which includes checking for microscopic changes to the surface of your cervix (Pap test).  For women ages 21-65, health care providers may recommend a pelvic exam and a Pap test every three years. For women ages 59-65, they may recommend the Pap test and pelvic exam, combined with testing for human papilloma virus (HPV), every five years. Some types of HPV increase your risk of cervical cancer. Testing for HPV may also be done on women of any age who have unclear Pap test results.  Other health care providers may not  recommend any screening for nonpregnant women who are considered low risk for pelvic cancer and have no symptoms. Ask your health care provider if a screening pelvic exam is right for you.  If you have had past treatment for cervical cancer or a condition that could lead to cancer, you need Pap tests and screening for cancer for at least 20 years after your treatment. If Pap tests have been discontinued for you, your risk factors (such as having a new sexual partner) need to be reassessed to determine if you should start having screenings again. Some women have medical problems that increase the chance of getting cervical cancer. In these cases, your health care provider may recommend that you have screening and Pap tests more often.  If you have a family history of uterine cancer or ovarian cancer, talk with your health care provider about genetic screening.  If you have vaginal bleeding after reaching menopause, tell your health care provider.  There are currently no reliable tests available to screen for ovarian cancer.  Lung Cancer Lung cancer screening is recommended for adults 1-65 years old who are at high risk for lung cancer because of a history of smoking. A yearly low-dose CT scan of the lungs is recommended if you:  Currently smoke.  Have a history of at  least 30 pack-years of smoking and you currently smoke or have quit within the past 15 years. A pack-year is smoking an average of one pack of cigarettes per day for one year.  Yearly screening should:  Continue until it has been 15 years since you quit.  Stop if you develop a health problem that would prevent you from having lung cancer treatment.  Colorectal Cancer  This type of cancer can be detected and can often be prevented.  Routine colorectal cancer screening usually begins at age 9 and continues through age 64.  If you have risk factors for colon cancer, your health care provider may recommend that you be screened  at an earlier age.  If you have a family history of colorectal cancer, talk with your health care provider about genetic screening.  Your health care provider may also recommend using home test kits to check for hidden blood in your stool.  A small camera at the end of a tube can be used to examine your colon directly (sigmoidoscopy or colonoscopy). This is done to check for the earliest forms of colorectal cancer.  Direct examination of the colon should be repeated every 5-10 years until age 88. However, if early forms of precancerous polyps or small growths are found or if you have a family history or genetic risk for colorectal cancer, you may need to be screened more often.  Skin Cancer  Check your skin from head to toe regularly.  Monitor any moles. Be sure to tell your health care provider: ? About any new moles or changes in moles, especially if there is a change in a mole's shape or color. ? If you have a mole that is larger than the size of a pencil eraser.  If any of your family members has a history of skin cancer, especially at a young age, talk with your health care provider about genetic screening.  Always use sunscreen. Apply sunscreen liberally and repeatedly throughout the day.  Whenever you are outside, protect yourself by wearing long sleeves, pants, a wide-brimmed hat, and sunglasses.  What should I know about osteoporosis? Osteoporosis is a condition in which bone destruction happens more quickly than new bone creation. After menopause, you may be at an increased risk for osteoporosis. To help prevent osteoporosis or the bone fractures that can happen because of osteoporosis, the following is recommended:  If you are 28-78 years old, get at least 1,000 mg of calcium and at least 600 mg of vitamin D per day.  If you are older than age 51 but younger than age 61, get at least 1,200 mg of calcium and at least 600 mg of vitamin D per day.  If you are older than age 71,  get at least 1,200 mg of calcium and at least 800 mg of vitamin D per day.  Smoking and excessive alcohol intake increase the risk of osteoporosis. Eat foods that are rich in calcium and vitamin D, and do weight-bearing exercises several times each week as directed by your health care provider. What should I know about how menopause affects my mental health? Depression may occur at any age, but it is more common as you become older. Common symptoms of depression include:  Low or sad mood.  Changes in sleep patterns.  Changes in appetite or eating patterns.  Feeling an overall lack of motivation or enjoyment of activities that you previously enjoyed.  Frequent crying spells.  Talk with your health care provider if you  think that you are experiencing depression. What should I know about immunizations? It is important that you get and maintain your immunizations. These include:  Tetanus, diphtheria, and pertussis (Tdap) booster vaccine.  Influenza every year before the flu season begins.  Pneumonia vaccine.  Shingles vaccine.  Your health care provider may also recommend other immunizations. This information is not intended to replace advice given to you by your health care provider. Make sure you discuss any questions you have with your health care provider. Document Released: 03/08/2005 Document Revised: 08/04/2015 Document Reviewed: 10/18/2014 Elsevier Interactive Patient Education  2018 [employer].

## 2016-11-04 NOTE — Progress Notes (Signed)
Subjective:    Patient ID: Sally Douglas, female    DOB: Jan 05, 1946, 71 y.o.   MRN: [id number]  HPI  71 year old patient who was scheduled today for a physical.  She declines. She is scheduled later today for a follow-up chest CT to evaluate a solitary pulmonary nodule which has been stable She has a history of nonischemic cardiomyopathy.  She is doing quite well. She is quite active with yard work including mowing and has no exercise limitations. She is objecting to the cost of diltiazem.  She has self discontinued Ambien..  Denies any cardiopulmonary complaints  Past Medical History:  Diagnosis Date  . Abnormal finding on Pap smear    hx abnl pap  . Arthritis   . Bronchitis    hx of-many yrs ago  . Constipation    takes Miralax daily  . Hypertension    takes Diltiazem and HCTZ daily  . Insomnia    takes Ambien nightly  . Joint pain   . Joint swelling   . Low back pain   . Menopausal syndrome   . Obesity   . Perforated gastric ulcer ([place])   . Tobacco abuse      Social History   Social History  . Marital status: Married    Spouse name: N/A  . Number of children: N/A  . Years of education: N/A   Occupational History  . retired    Social History Main Topics  . Smoking status: Former Smoker    Packs/day: 0.25    Years: 25.00    Types: Cigarettes    Quit date: 10/16/2014  . Smokeless tobacco: Never Used     Comment: Used Electronic Cigarettes until quit  . Alcohol use No  . Drug use: No  . Sexual activity: Not Currently    Birth control/ protection: Post-menopausal   Other Topics Concern  . Not on file   Social History Narrative   gavidia 1   Para 1    aborta 0    Past Surgical History:  Procedure Laterality Date  . CHOLECYSTECTOMY N/A 05/05/2015   Procedure: LAPAROSCOPIC CHOLECYSTECTOMY;  Surgeon: Rolm Bookbinder, MD;  Location: [facility];  Service: General;  Laterality: N/A;  . DILATION AND CURETTAGE OF UTERUS    . LAPAROTOMY N/A 10/16/2014   Procedure: EXPLORATORY LAPAROTOMY, REPAIR OF GASTRIC ULCER;  Surgeon: Georganna Skeans, MD;  Location: [facility];  Service: General;  Laterality: N/A;  . MAXIMUM ACCESS (MAS)POSTERIOR LUMBAR INTERBODY FUSION (PLIF) 1 LEVEL N/A 11/04/2012   Procedure: Lumbar four-five Maximum access posterior lumbar interbody fusion with Harlin Heys;  Surgeon: Eustace Moore, MD;  Location: [facility];  Service: Neurosurgery;  Laterality: N/A;  Lumbar four-five Maximum access posterior lumbar interbody fusion with Nuvasive  . TONSILLECTOMY    . VENTRAL HERNIA REPAIR  01/24/2011   Procedure: LAPAROSCOPIC VENTRAL HERNIA;  Surgeon: Adin Hector, MD;  Location: [facility];  Service: General;  Laterality: N/A;  with Mesh    Family History  Problem Relation Age of Onset  . Cancer Brother        renal ,also sister  . COPD Brother   . Hypertension Brother   . Diabetes Brother   . Kidney disease Brother   . Diabetes Brother        and sister  . Breast cancer Unknown        sister  . Kidney disease Unknown   . Cancer Mother        Breast Cancer  .  Diabetes Mother   . Hypertension Mother   . Glaucoma Mother   . Diabetes Sister   . Hypertension Sister   . Heart disease Sister     Allergies  Allergen Reactions  . K-Dur [Potassium Chloride] Swelling    Cannot tolerate Potassium in any prepared form. Causes Face Swelling  . Nsaids Other (See Comments)    H/o PUD with perforation    Current Outpatient Prescriptions on File Prior to Visit  Medication Sig Dispense Refill  . bifidobacterium infantis (ALIGN) capsule Take 1 capsule by mouth daily.    [name] omeprazole (PRILOSEC) 40 MG capsule TAKE 1 CAPSULE BY MOUTH DAILY 90 capsule 3  . polyethylene glycol (MIRALAX / GLYCOLAX) packet Take 17 g by mouth daily as needed for mild constipation.     . VOLTAREN 1 % GEL Apply 1 application topically daily as needed. FOR HANDS  2   No current facility-administered medications on file prior to visit.     BP (!) 142/80 (BP  Location: Left Arm, Patient Position: Sitting, Cuff Size: Normal)   Pulse 71   Temp 98 F (36.7 C) (Oral)   Ht 4\' 8"  (1.422 m)   Wt 152 lb 6.4 oz (69.1 kg)   SpO2 97%   BMI 34.17 kg/m     Review of Systems  Constitutional: Negative.   HENT: Negative for congestion, dental problem, hearing loss, rhinorrhea, sinus pressure, sore throat and tinnitus.   Eyes: Negative for pain, discharge and visual disturbance.  Respiratory: Negative for cough and shortness of breath.   Cardiovascular: Negative for chest pain, palpitations and leg swelling.  Gastrointestinal: Negative for abdominal distention, abdominal pain, blood in stool, constipation, diarrhea, nausea and vomiting.  Genitourinary: Negative for difficulty urinating, dysuria, flank pain, frequency, hematuria, pelvic pain, urgency, vaginal bleeding, vaginal discharge and vaginal pain.  Musculoskeletal: Negative for arthralgias, gait problem and joint swelling.  Skin: Negative for rash.  Neurological: Negative for dizziness, syncope, speech difficulty, weakness, numbness and headaches.  Hematological: Negative for adenopathy.  Psychiatric/Behavioral: Negative for agitation, behavioral problems and dysphoric mood. The patient is not nervous/anxious.        Objective:   Physical Exam  Constitutional: She is oriented to person, place, and time. She appears well-developed and well-nourished.  Blood pressure 140/80  HENT:  Head: Normocephalic.  Right Ear: External ear normal.  Left Ear: External ear normal.  Mouth/Throat: Oropharynx is clear and moist.  Eyes: Pupils are equal, round, and reactive to light. Conjunctivae and EOM are normal.  Neck: Normal range of motion. Neck supple. No thyromegaly present.  Cardiovascular: Normal rate, regular rhythm, normal heart sounds and intact distal pulses.   Pulmonary/Chest: Effort normal and breath sounds normal.  Abdominal: Soft. Bowel sounds are normal. She exhibits no mass. There is no  tenderness.  Musculoskeletal: Normal range of motion.  Lymphadenopathy:    She has no cervical adenopathy.  Neurological: She is alert and oriented to person, place, and time.  Skin: Skin is warm and dry. No rash noted.  Psychiatric: She has a normal mood and affect. Her behavior is normal.          Assessment & Plan:   Essential hypertension History of nonischemic cardiomyopathy.  2.  Echocardiogram was reviewed from 2016 with normal ejection fraction.  There was mild LVH and grade 1 diastolic dysfunction Obesity.  Weight loss encouraged History solitary pulmonary nodule.  Patient is scheduled for follow-up chest CT today  Patient declines annual exam Patient does agree to follow-up  lab Home blood pressure monitoring.  Encouraged Low-salt diet and weight loss encouraged  Follow-up 6 months.  Due to price considerations.  We'll switch to amlodipine  Nyoka Cowden

## 2016-11-08 ENCOUNTER — Encounter: Payer: Self-pay | Admitting: Pulmonary Disease

## 2016-11-08 ENCOUNTER — Ambulatory Visit (INDEPENDENT_AMBULATORY_CARE_PROVIDER_SITE_OTHER): Payer: Medicare Other | Admitting: Pulmonary Disease

## 2016-11-08 VITALS — BP 124/80 | HR 77 | Ht <= 58 in | Wt 153.4 lb

## 2016-11-08 DIAGNOSIS — R911 Solitary pulmonary nodule: Secondary | ICD-10-CM

## 2016-11-08 NOTE — Progress Notes (Signed)
  Current Outpatient Prescriptions on File Prior to Visit  Medication Sig  . amLODipine (NORVASC) 10 MG tablet Take 1 tablet (10 mg total) by mouth daily.  . bifidobacterium infantis (ALIGN) capsule Take 1 capsule by mouth daily.  [name] omeprazole (PRILOSEC) 40 MG capsule TAKE 1 CAPSULE BY MOUTH DAILY  . polyethylene glycol (MIRALAX / GLYCOLAX) packet Take 17 g by mouth daily as needed for mild constipation.   . VOLTAREN 1 % GEL Apply 1 application topically daily as needed. FOR HANDS   No current facility-administered medications on file prior to visit.     Chief Complaint  Patient presents with  . Follow-up    CT scan done 11/04/16. Pt states that she has been doing good. Denies any complaints or concerns.    Chest imaging CT chest 10/16/14 >>  RLL nodule 1.1 cm CT chest 03/27/15 >> RLL nodule 1.1 cm CT chest 11/04/16 >> RLL nodule 1 cm  Past medical history HTN, Insomnia, Back pain, PUD  Past surgical history, Family history, Social history, Allergies reviewed  Vital signs BP 124/80 (BP Location: Left Arm, Cuff Size: Normal)   Pulse 77   Ht 4\' 8"  (1.422 m)   Wt 153 lb 6.4 oz (69.6 kg)   SpO2 97%   BMI 34.39 kg/m   History of present illness Sally Douglas is a 71 y.o. female former smoker for evaluation of lung nodule.  She has been doing well.  She is very active.  Denies cough, wheeze, sputum, chest pain, fever, gland swelling, rash, joint swelling, or hemoptysis.  She had CT chest from 11/04/16 which showed stability of rt lower lung nodule.  Physical exam  General - pleasant Eyes - pupils reactive ENT - no sinus tenderness, no oral exudate, no LAN Cardiac - regular, no murmur Chest - no wheeze, rales Abd - soft, non tender Ext - no edema Skin - no rashes Neuro - normal strength Psych - normal mood   Discussion 71 yo female former smoker with incidental finding of 1.1 cm right lower lung nodule on CT chest from September 2016.  This has been stable on serial CT  imaging for 2 years.  This most likely represents a benign lesion.  No additional radiographic follow up needed.   Patient Instructions  Follow up as needed   Chesley Mires, MD [facility] Pager:  [phone] 11/08/2016, 10:43 AM

## 2016-11-08 NOTE — Patient Instructions (Signed)
Follow up as needed

## 2016-11-21 ENCOUNTER — Telehealth: Payer: Self-pay | Admitting: Internal Medicine

## 2016-11-21 ENCOUNTER — Other Ambulatory Visit: Payer: Self-pay | Admitting: Internal Medicine

## 2016-11-21 NOTE — Telephone Encounter (Signed)
Pt was seen on 11/04/16 and per pt she was taken off of diltiazem and put on amlodipine. Pt would like to know why diltiazem was sent to pharm today

## 2016-11-22 IMAGING — MR MR ABDOMEN WO/W CM MRCP
11 of 20 series · 20 of 48 positions shown · IV contrast (multihance)
Comparison: Ultrasound 05/02/2015 and CT scan 05/02/2015.

CLINICAL DATA: Three-week history of diffuse upper abdominal pain.
Abnormal ultrasound and CT scans.

EXAM:
MRI ABDOMEN WITHOUT AND WITH CONTRAST (INCLUDING MRCP)
TECHNIQUE: Multiplanar multisequence MR imaging of the abdomen was performed
both before and after the administration of intravenous contrast.
Heavily T2-weighted images of the biliary and pancreatic ducts were
obtained, and three-dimensional MRCP images were rendered by post
processing.
CONTRAST:  15mL MULTIHANCE GADOBENATE DIMEGLUMINE 529 MG/ML IV SOLN

[Series 3: T2 · axial · 5.0mm · 0.72mm/px · 1 of 42 slices shown (1 of 2)]
[im 1/42]
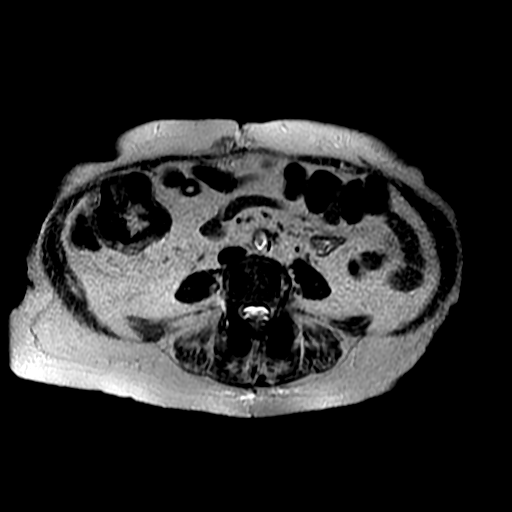

[Series 4: T2 · coronal · 5.0mm · 0.78mm/px · 1 of 31 slices shown (2 of 2)]
[im 1/31]
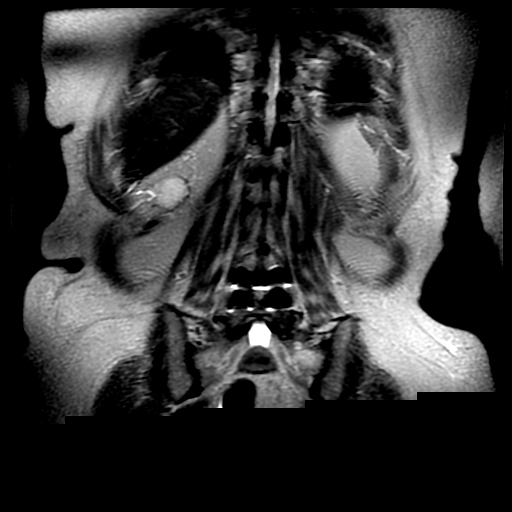

[Series 5: T2 fat-sat · axial · 5.0mm · 0.72mm/px · z∈[-25,+179]mm · 2 of 42 slices shown]
[im 1/42]
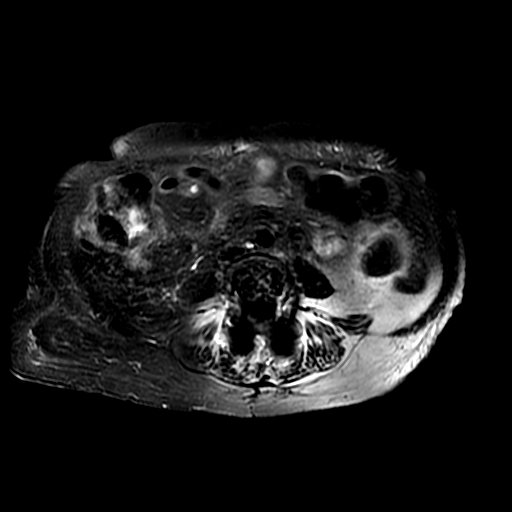
[im 42/42]
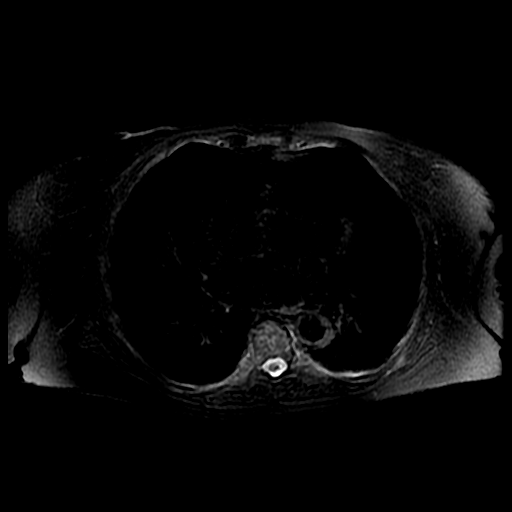

[Series 6: DWI b500 · axial · 6.0mm · 1.48mm/px · z∈[-18,+177]mm · 2 of 52 slices shown]
[im 1/52]
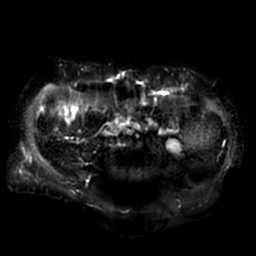
[im 52/52]
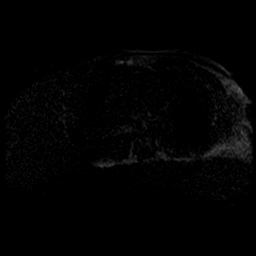

[Series 8: MRCP · coronal · 2.0mm · 0.70mm/px · 2 of 40 slices shown (1 of 2)]
[im 1/40]
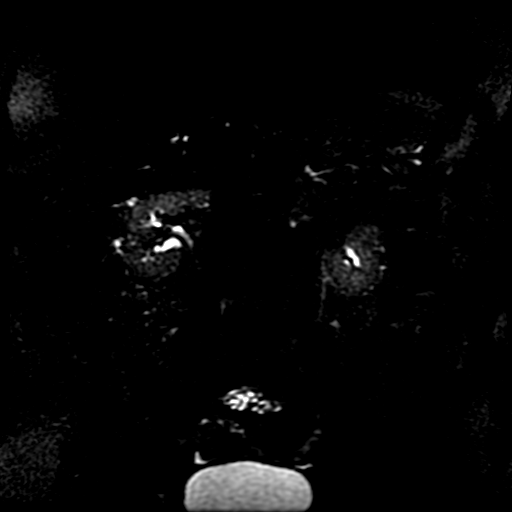
[im 40/40]
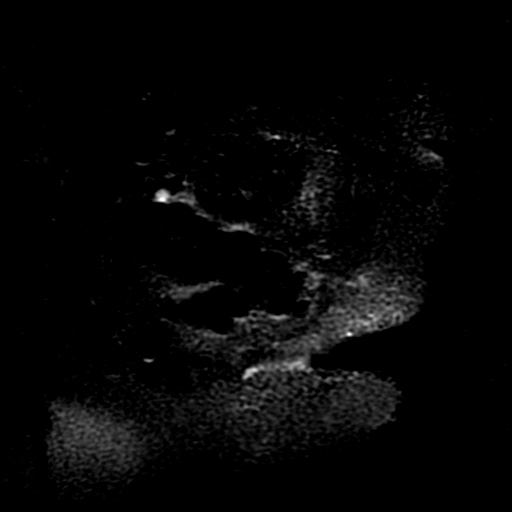

[Series 11: MRCP · oblique · 40.0mm · 0.70mm/px · 1 of 6 slices shown (2 of 2)]
[im 1/6]
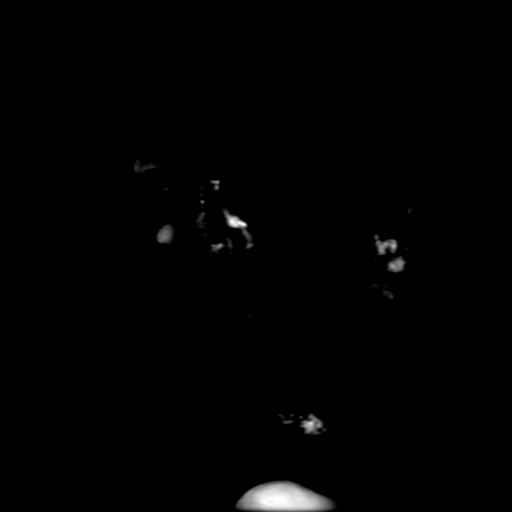

[Series 12: ax dualecho · axial · 5.0mm · 0.70mm/px · z∈[-25,+179]mm · 3 of 84 slices shown]
[im 1/84]
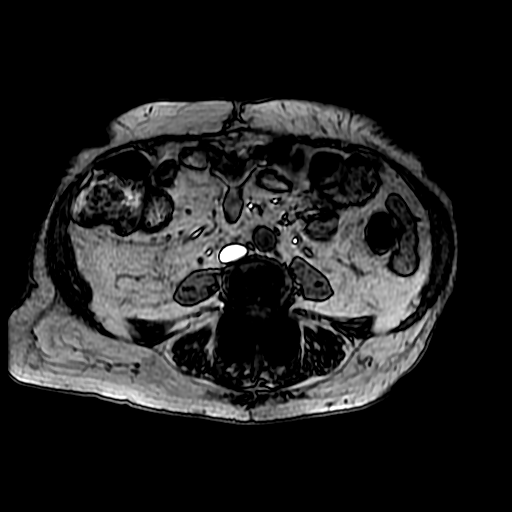
[im 42/84]
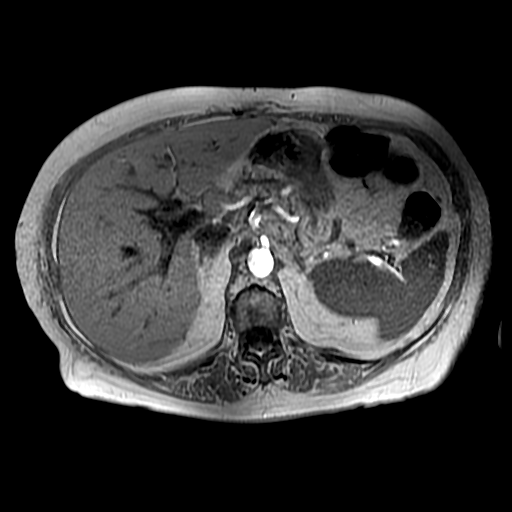
[im 84/84]
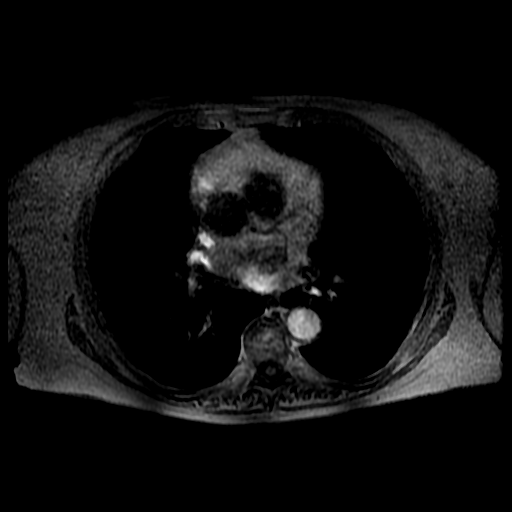

[Series 14: T1 dynamic post-contrast · coronal · 5.0mm · 0.74mm/px · 3 of 88 slices shown]
[im 1/88]
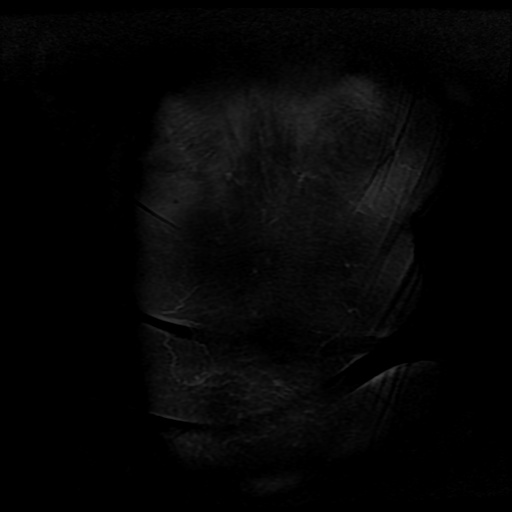
[im 44/88]
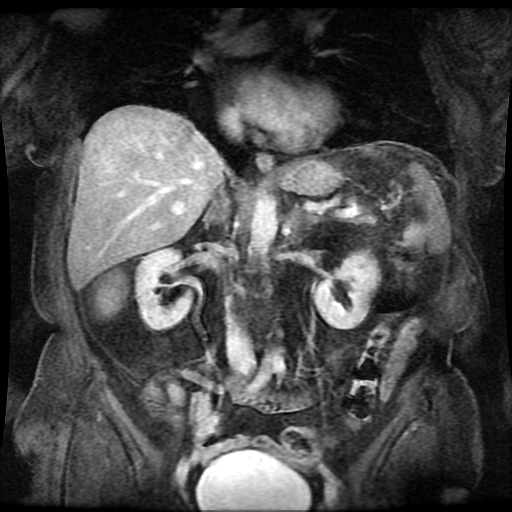
[im 88/88]
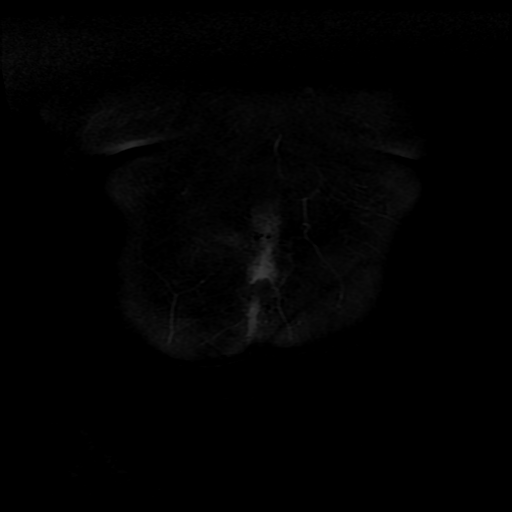

[Series 600: DWI · axial · 6.0mm · 1.48mm/px · 1 of 26 slices shown]
[im 1/26]
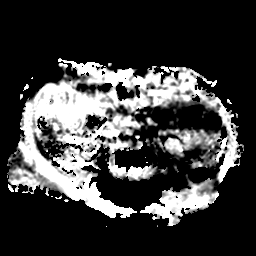

[Series 701: processed images · oblique · 1.6mm · 0.62mm/px · 1 of 10 slices shown]
[im 1/10]
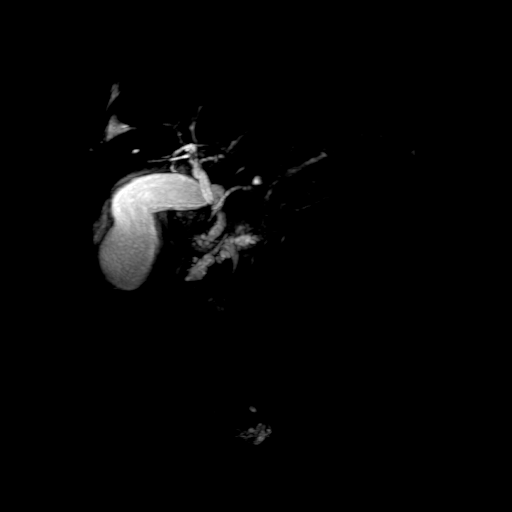

[Series 1300: T1 dynamic · axial · 5.0mm · 0.72mm/px · z∈[-16,+180]mm · 3 of 80 slices shown]
[im 1/80]
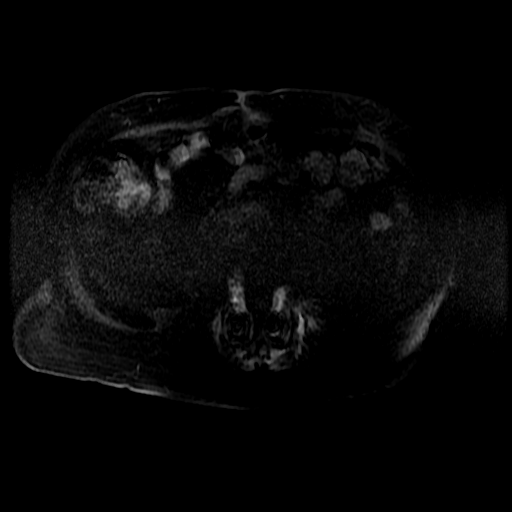
[im 40/80]
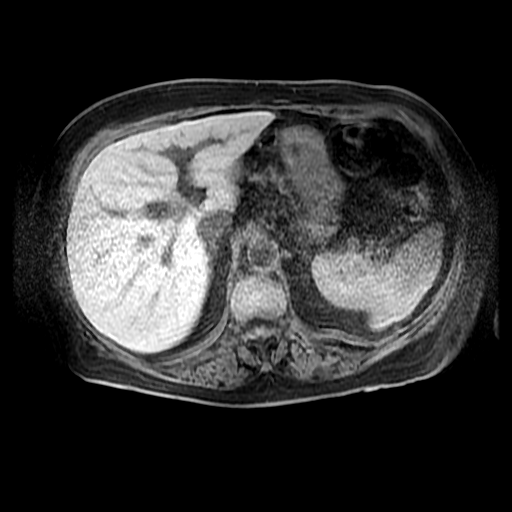
[im 80/80]
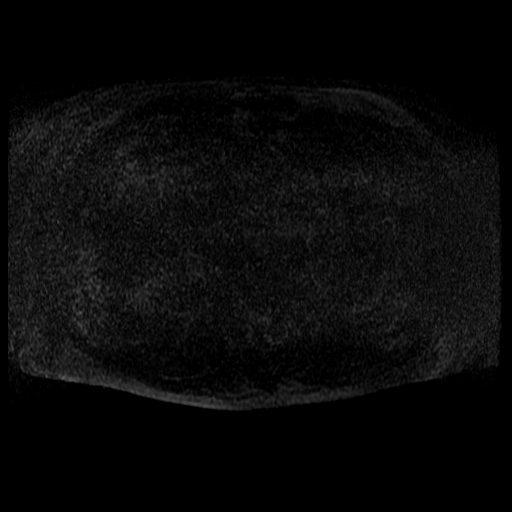

[20 of 48 positions shown; findings below may reference images not displayed]

FINDINGS: Lower chest: The lung bases are clear of acute process. No pleural
or pericardial effusion. Right lower lobe pulmonary nodule is again
noted.

Hepatobiliary: Simple hepatic cyst noted near the gallbladder in
segment 4B. Other small scattered cysts are noted. No worrisome
hepatic lesions or perihepatic fluid collections. Mild intrahepatic
biliary dilatation. There are numerous gallstones in the gallbladder
along with pericholecystic fluid and gallbladder wall thickening
suggesting acute cholecystitis. The common bile duct measures 9 mm.
2 or 3 small common bile duct stones are seen distally.

Pancreas: No mass, inflammation or ductal dilatation.

Spleen: Normal size.  No focal lesions.

Adrenals/Urinary Tract: The adrenal glands are unremarkable. There
is a simple right renal cyst. No worrisome renal lesions or
hydronephrosis.

Stomach/Bowel: The stomach, duodenum, visualized small bowel and
visualized colon are unremarkable.

Vascular/Lymphatic: The aorta and branch vessels are patent. The
major venous structures are patent. No mesenteric or retroperitoneal
mass or adenopathy.

Other: Anterior abdominal wall hernia containing omental fat.
Possible surrounding prior surgical changes with enhancing
granulation tissue. No ascites. Spinal fusion hardware noted.

Musculoskeletal: No significant bony findings.
IMPRESSION: 1. Distended gallbladder with numerous small layering gallstones.
Gallbladder wall thickening and pericholecystic fluid suggesting
acute cholecystitis. There is a 3.5 mm calculus in the gallbladder
neck/cystic duct region. There are also 2 or 3 small stones in the
distal common bile duct. Mild common bile duct dilatation at 9 mm.
2. Normal caliber and course of the main pancreatic duct. A 4 mm
pancreatic body cyst is noted.
3. Small hepatic cysts. No worrisome hepatic lesions. Mild
intrahepatic biliary dilatation.
4. Right renal cyst.
5. Small anterior abdominal wall hernia.

## 2016-11-22 NOTE — Telephone Encounter (Signed)
Spoke with pt and updated chart to reflect d/c of medication.

## 2016-12-25 DIAGNOSIS — M545 Low back pain: Secondary | ICD-10-CM | POA: Diagnosis not present

## 2016-12-25 DIAGNOSIS — M48061 Spinal stenosis, lumbar region without neurogenic claudication: Secondary | ICD-10-CM | POA: Diagnosis not present

## 2017-03-18 DIAGNOSIS — M545 Low back pain: Secondary | ICD-10-CM | POA: Diagnosis not present

## 2017-03-18 DIAGNOSIS — M25511 Pain in right shoulder: Secondary | ICD-10-CM | POA: Diagnosis not present

## 2017-03-18 DIAGNOSIS — Z6835 Body mass index (BMI) 35.0-35.9, adult: Secondary | ICD-10-CM | POA: Diagnosis not present

## 2017-03-18 DIAGNOSIS — I10 Essential (primary) hypertension: Secondary | ICD-10-CM | POA: Diagnosis not present

## 2017-03-18 DIAGNOSIS — M48061 Spinal stenosis, lumbar region without neurogenic claudication: Secondary | ICD-10-CM | POA: Diagnosis not present

## 2017-04-04 ENCOUNTER — Other Ambulatory Visit: Payer: Self-pay | Admitting: Internal Medicine

## 2017-04-07 DIAGNOSIS — H25811 Combined forms of age-related cataract, right eye: Secondary | ICD-10-CM | POA: Diagnosis not present

## 2017-04-07 DIAGNOSIS — H25812 Combined forms of age-related cataract, left eye: Secondary | ICD-10-CM | POA: Diagnosis not present

## 2017-04-07 DIAGNOSIS — H01022 Squamous blepharitis right lower eyelid: Secondary | ICD-10-CM | POA: Diagnosis not present

## 2017-04-07 DIAGNOSIS — H01024 Squamous blepharitis left upper eyelid: Secondary | ICD-10-CM | POA: Diagnosis not present

## 2017-04-07 DIAGNOSIS — H01025 Squamous blepharitis left lower eyelid: Secondary | ICD-10-CM | POA: Diagnosis not present

## 2017-04-07 DIAGNOSIS — H35371 Puckering of macula, right eye: Secondary | ICD-10-CM | POA: Diagnosis not present

## 2017-04-07 DIAGNOSIS — H01021 Squamous blepharitis right upper eyelid: Secondary | ICD-10-CM | POA: Diagnosis not present

## 2017-04-21 ENCOUNTER — Ambulatory Visit (INDEPENDENT_AMBULATORY_CARE_PROVIDER_SITE_OTHER): Payer: Medicare Other | Admitting: Internal Medicine

## 2017-04-21 ENCOUNTER — Encounter: Payer: Self-pay | Admitting: Internal Medicine

## 2017-04-21 VITALS — BP 140/80 | HR 79 | Temp 98.0°F | Wt 162.8 lb

## 2017-04-21 DIAGNOSIS — I10 Essential (primary) hypertension: Secondary | ICD-10-CM

## 2017-04-21 DIAGNOSIS — I428 Other cardiomyopathies: Secondary | ICD-10-CM

## 2017-04-21 DIAGNOSIS — R911 Solitary pulmonary nodule: Secondary | ICD-10-CM

## 2017-04-21 NOTE — Progress Notes (Signed)
Subjective:    Patient ID: Sally Douglas, female    DOB: 05-Nov-1945, 72 y.o.   MRN: [id number]  HPI 72 year old patient has a history of hypertension and nonischemic cardiomyopathy. She continues to do well. She does have a history of a benign pulmonary nodule.  Follow-up chest CT has been stable and no further radiographic follow-up needed Doing quite well today.  She is requesting a renewal on disability parking placard  Past Medical History:  Diagnosis Date  . Abnormal finding on Pap smear    hx abnl pap  . Arthritis   . Bronchitis    hx of-many yrs ago  . Constipation    takes Miralax daily  . Hypertension    takes Diltiazem and HCTZ daily  . Insomnia    takes Ambien nightly  . Joint pain   . Joint swelling   . Low back pain   . Menopausal syndrome   . Obesity   . Perforated gastric ulcer ([place])   . Tobacco abuse      Social History   Socioeconomic History  . Marital status: Married    Spouse name: Not on file  . Number of children: Not on file  . Years of education: Not on file  . Highest education level: Not on file  Occupational History  . Occupation: retired  Scientific laboratory technician  . Financial resource strain: Not on file  . Food insecurity:    Worry: Not on file    Inability: Not on file  . Transportation needs:    Medical: Not on file    Non-medical: Not on file  Tobacco Use  . Smoking status: Former Smoker    Packs/day: 0.25    Years: 25.00    Pack years: 6.25    Types: Cigarettes    Last attempt to quit: 10/16/2014    Years since quitting: 2.5  . Smokeless tobacco: Never Used  . Tobacco comment: Used Electronic Cigarettes until quit  Substance and Sexual Activity  . Alcohol use: No    Alcohol/week: 0.0 oz  . Drug use: No  . Sexual activity: Not Currently    Birth control/protection: Post-menopausal  Lifestyle  . Physical activity:    Days per week: Not on file    Minutes per session: Not on file  . Stress: Not on file  Relationships  . Social  connections:    Talks on phone: Not on file    Gets together: Not on file    Attends religious service: Not on file    Active member of club or organization: Not on file    Attends meetings of clubs or organizations: Not on file    Relationship status: Not on file  . Intimate partner violence:    Fear of current or ex partner: Not on file    Emotionally abused: Not on file    Physically abused: Not on file    Forced sexual activity: Not on file  Other Topics Concern  . Not on file  Social History Narrative   gavidia 1   Para 1    aborta 0    Past Surgical History:  Procedure Laterality Date  . CHOLECYSTECTOMY N/A 05/05/2015   Procedure: LAPAROSCOPIC CHOLECYSTECTOMY;  Surgeon: Rolm Bookbinder, MD;  Location: [place];  Service: General;  Laterality: N/A;  . DILATION AND CURETTAGE OF UTERUS    . LAPAROTOMY N/A 10/16/2014   Procedure: EXPLORATORY LAPAROTOMY, REPAIR OF GASTRIC ULCER;  Surgeon: Georganna Skeans, MD;  Location: [place];  Service: General;  Laterality: N/A;  . MAXIMUM ACCESS (MAS)POSTERIOR LUMBAR INTERBODY FUSION (PLIF) 1 LEVEL N/A 11/04/2012   Procedure: Lumbar four-five Maximum access posterior lumbar interbody fusion with Nuvasive;  Surgeon: Eustace Moore, MD;  Location: [facility];  Service: Neurosurgery;  Laterality: N/A;  Lumbar four-five Maximum access posterior lumbar interbody fusion with Nuvasive  . TONSILLECTOMY    . VENTRAL HERNIA REPAIR  01/24/2011   Procedure: LAPAROSCOPIC VENTRAL HERNIA;  Surgeon: Adin Hector, MD;  Location: [facility];  Service: General;  Laterality: N/A;  with Mesh    Family History  Problem Relation Age of Onset  . Cancer Brother        renal ,also sister  . COPD Brother   . Hypertension Brother   . Diabetes Brother   . Kidney disease Brother   . Diabetes Brother        and sister  . Breast cancer Unknown        sister  . Kidney disease Unknown   . Cancer Mother        Breast Cancer  . Diabetes Mother   . Hypertension Mother   .  Glaucoma Mother   . Diabetes Sister   . Hypertension Sister   . Heart disease Sister     Allergies  Allergen Reactions  . K-Dur [Potassium Chloride] Swelling    Cannot tolerate Potassium in any prepared form. Causes Face Swelling  . Nsaids Other (See Comments)    H/o PUD with perforation    Current Outpatient Medications on File Prior to Visit  Medication Sig Dispense Refill  . amLODipine (NORVASC) 10 MG tablet Take 1 tablet (10 mg total) by mouth daily. 30 tablet 6  . bifidobacterium infantis (ALIGN) capsule Take 1 capsule by mouth daily.    . diclofenac (VOLTAREN) 0.1 % ophthalmic solution 1 drop 4 (four) times daily.    [name] HYDROcodone-acetaminophen (NORCO) 10-325 MG tablet TAKE 1 TABLET EVERY 5 HOURS AS NEEDED FOR PAIN (DNF 04/08/17)  0  . omeprazole (PRILOSEC) 40 MG capsule TAKE 1 CAPSULE BY MOUTH DAILY 90 capsule 0  . polyethylene glycol (MIRALAX / GLYCOLAX) packet Take 17 g by mouth daily as needed for mild constipation.     . VOLTAREN 1 % GEL Apply 1 application topically daily as needed. FOR HANDS  2   No current facility-administered medications on file prior to visit.     BP 140/80 (BP Location: Right Arm, Patient Position: Sitting, Cuff Size: Large)   Pulse 79   Temp 98 F (36.7 C) (Oral)   Wt 162 lb 12.8 oz (73.8 kg)   SpO2 97%   BMI 36.50 kg/m      Review of Systems  Constitutional: Negative.   HENT: Negative for congestion, dental problem, hearing loss, rhinorrhea, sinus pressure, sore throat and tinnitus.   Eyes: Negative for pain, discharge and visual disturbance.  Respiratory: Negative for cough and shortness of breath.   Cardiovascular: Negative for chest pain, palpitations and leg swelling.  Gastrointestinal: Negative for abdominal distention, abdominal pain, blood in stool, constipation, diarrhea, nausea and vomiting.  Genitourinary: Negative for difficulty urinating, dysuria, flank pain, frequency, hematuria, pelvic pain, urgency, vaginal bleeding,  vaginal discharge and vaginal pain.  Musculoskeletal: Positive for arthralgias, back pain and gait problem. Negative for joint swelling.  Skin: Negative for rash.  Neurological: Negative for dizziness, syncope, speech difficulty, weakness, numbness and headaches.  Hematological: Negative for adenopathy.  Psychiatric/Behavioral: Negative for agitation, behavioral problems and dysphoric mood. The patient  is not nervous/anxious.        Objective:   Physical Exam  Constitutional: She is oriented to person, place, and time. She appears well-developed and well-nourished.  Blood pressure 130/80  HENT:  Head: Normocephalic.  Right Ear: External ear normal.  Left Ear: External ear normal.  Mouth/Throat: Oropharynx is clear and moist.  Eyes: Pupils are equal, round, and reactive to light. Conjunctivae and EOM are normal.  Neck: Normal range of motion. Neck supple. No thyromegaly present.  Cardiovascular: Normal rate, regular rhythm, normal heart sounds and intact distal pulses.  Pulmonary/Chest: Effort normal and breath sounds normal.  Abdominal: Soft. Bowel sounds are normal. She exhibits no mass. There is no tenderness.  Musculoskeletal: Normal range of motion.  Lymphadenopathy:    She has no cervical adenopathy.  Neurological: She is alert and oriented to person, place, and time.  Skin: Skin is warm and dry. No rash noted.  Psychiatric: She has a normal mood and affect. Her behavior is normal.          Assessment & Plan:   Essential hypertension well-controlled Obesity History of nonischemic cardiomyopathy Solitary pulmonary nodule.  Benign no further radiographic follow-up indicated  [employer] placard completed and signed  Follow-up 6 months  [id number],PETER Pilar Plate

## 2017-04-21 NOTE — Patient Instructions (Signed)
Limit your sodium (Salt) intake  Please check your blood pressure on a regular basis.  If it is consistently greater than 150/90, please make an office appointment.  Return in 6 months for follow-up   

## 2017-05-07 DIAGNOSIS — H25811 Combined forms of age-related cataract, right eye: Secondary | ICD-10-CM | POA: Diagnosis not present

## 2017-05-07 DIAGNOSIS — H52221 Regular astigmatism, right eye: Secondary | ICD-10-CM | POA: Diagnosis not present

## 2017-05-07 DIAGNOSIS — H2511 Age-related nuclear cataract, right eye: Secondary | ICD-10-CM | POA: Diagnosis not present

## 2017-06-09 DIAGNOSIS — Z79891 Long term (current) use of opiate analgesic: Secondary | ICD-10-CM | POA: Diagnosis not present

## 2017-06-09 DIAGNOSIS — M48061 Spinal stenosis, lumbar region without neurogenic claudication: Secondary | ICD-10-CM | POA: Diagnosis not present

## 2017-06-09 DIAGNOSIS — M545 Low back pain: Secondary | ICD-10-CM | POA: Diagnosis not present

## 2017-06-09 DIAGNOSIS — Z79899 Other long term (current) drug therapy: Secondary | ICD-10-CM | POA: Diagnosis not present

## 2017-06-09 DIAGNOSIS — H2512 Age-related nuclear cataract, left eye: Secondary | ICD-10-CM | POA: Diagnosis not present

## 2017-06-09 DIAGNOSIS — Z5181 Encounter for therapeutic drug level monitoring: Secondary | ICD-10-CM | POA: Diagnosis not present

## 2017-06-11 DIAGNOSIS — H25812 Combined forms of age-related cataract, left eye: Secondary | ICD-10-CM | POA: Diagnosis not present

## 2017-06-11 DIAGNOSIS — H2512 Age-related nuclear cataract, left eye: Secondary | ICD-10-CM | POA: Diagnosis not present

## 2017-06-14 ENCOUNTER — Other Ambulatory Visit: Payer: Self-pay | Admitting: Internal Medicine

## 2017-07-02 ENCOUNTER — Other Ambulatory Visit: Payer: Self-pay | Admitting: Internal Medicine

## 2017-09-08 DIAGNOSIS — M48061 Spinal stenosis, lumbar region without neurogenic claudication: Secondary | ICD-10-CM | POA: Diagnosis not present

## 2017-09-08 DIAGNOSIS — Z6836 Body mass index (BMI) 36.0-36.9, adult: Secondary | ICD-10-CM | POA: Diagnosis not present

## 2017-09-08 DIAGNOSIS — I10 Essential (primary) hypertension: Secondary | ICD-10-CM | POA: Diagnosis not present

## 2017-09-08 DIAGNOSIS — M545 Low back pain: Secondary | ICD-10-CM | POA: Diagnosis not present

## 2017-09-28 ENCOUNTER — Other Ambulatory Visit: Payer: Self-pay | Admitting: Internal Medicine

## 2017-10-16 ENCOUNTER — Ambulatory Visit (INDEPENDENT_AMBULATORY_CARE_PROVIDER_SITE_OTHER): Payer: Medicare Other | Admitting: Internal Medicine

## 2017-10-16 ENCOUNTER — Encounter: Payer: Self-pay | Admitting: Internal Medicine

## 2017-10-16 VITALS — BP 164/80 | HR 76 | Temp 98.2°F | Wt 160.8 lb

## 2017-10-16 DIAGNOSIS — R911 Solitary pulmonary nodule: Secondary | ICD-10-CM

## 2017-10-16 DIAGNOSIS — K255 Chronic or unspecified gastric ulcer with perforation: Secondary | ICD-10-CM

## 2017-10-16 DIAGNOSIS — I428 Other cardiomyopathies: Secondary | ICD-10-CM | POA: Diagnosis not present

## 2017-10-16 DIAGNOSIS — I10 Essential (primary) hypertension: Secondary | ICD-10-CM | POA: Diagnosis not present

## 2017-10-16 MED ORDER — VOLTAREN 1 % TD GEL: 1.0000 "application " | Freq: Every day | TRANSDERMAL | 6 refills | Status: AC | PRN

## 2017-10-16 MED ORDER — OMEPRAZOLE 40 MG PO CPDR: 40.0000 mg | DELAYED_RELEASE_CAPSULE | Freq: Every day | ORAL | 4 refills | Status: DC

## 2017-10-16 MED ORDER — POLYETHYLENE GLYCOL 3350 17 G PO PACK: 17.0000 g | PACK | Freq: Every day | ORAL | 6 refills | Status: DC | PRN

## 2017-10-16 MED ORDER — AMLODIPINE BESYLATE 10 MG PO TABS: 10.0000 mg | ORAL_TABLET | Freq: Every day | ORAL | 6 refills | Status: DC

## 2017-10-16 NOTE — Patient Instructions (Signed)
Limit your sodium (Salt) intake  Please check your blood pressure on a regular basis.  If it is consistently greater than 150/90, please make an office appointment.  Return in 6 months for follow-up   

## 2017-10-16 NOTE — Progress Notes (Signed)
Subjective:    Patient ID: Sally Douglas, female    DOB: 11/27/1945, 72 y.o.   MRN: [id number]  HPI 72 year old patient who is seen today for follow-up of hypertension.  Presently has been controlled on amlodipine. She has a history of chronic diastolic dysfunction.  She also has a history of Takatsubo cardiomyopathy in the setting of critical medical illness (perforated viscus).   Doing well today.  Has also been followed by pulmonary medicine due to a pulmonary nodule.  Past Medical History:  Diagnosis Date  . Abnormal finding on Pap smear    hx abnl pap  . Arthritis   . Bronchitis    hx of-many yrs ago  . Constipation    takes Miralax daily  . Hypertension    takes Diltiazem and HCTZ daily  . Insomnia    takes Ambien nightly  . Joint pain   . Joint swelling   . Low back pain   . Menopausal syndrome   . Obesity   . Perforated gastric ulcer ([place])   . Tobacco abuse      Social History   Socioeconomic History  . Marital status: Married    Spouse name: Not on file  . Number of children: Not on file  . Years of education: Not on file  . Highest education level: Not on file  Occupational History  . Occupation: retired  Scientific laboratory technician  . Financial resource strain: Not on file  . Food insecurity:    Worry: Not on file    Inability: Not on file  . Transportation needs:    Medical: Not on file    Non-medical: Not on file  Tobacco Use  . Smoking status: Former Smoker    Packs/day: 0.25    Years: 25.00    Pack years: 6.25    Types: Cigarettes    Last attempt to quit: 10/16/2014    Years since quitting: 3.0  . Smokeless tobacco: Never Used  . Tobacco comment: Used Electronic Cigarettes until quit  Substance and Sexual Activity  . Alcohol use: No    Alcohol/week: 0.0 standard drinks  . Drug use: No  . Sexual activity: Not Currently    Birth control/protection: Post-menopausal  Lifestyle  . Physical activity:    Days per week: Not on file    Minutes per session:  Not on file  . Stress: Not on file  Relationships  . Social connections:    Talks on phone: Not on file    Gets together: Not on file    Attends religious service: Not on file    Active member of club or organization: Not on file    Attends meetings of clubs or organizations: Not on file    Relationship status: Not on file  . Intimate partner violence:    Fear of current or ex partner: Not on file    Emotionally abused: Not on file    Physically abused: Not on file    Forced sexual activity: Not on file  Other Topics Concern  . Not on file  Social History Narrative   gavidia 1   Para 1    aborta 0    Past Surgical History:  Procedure Laterality Date  . CHOLECYSTECTOMY N/A 05/05/2015   Procedure: LAPAROSCOPIC CHOLECYSTECTOMY;  Surgeon: Rolm Bookbinder, MD;  Location: [facility];  Service: General;  Laterality: N/A;  . DILATION AND CURETTAGE OF UTERUS    . LAPAROTOMY N/A 10/16/2014   Procedure: EXPLORATORY LAPAROTOMY, REPAIR OF GASTRIC  ULCER;  Surgeon: Georganna Skeans, MD;  Location: [place] [facility];  Service: General;  Laterality: N/A;  . MAXIMUM ACCESS (MAS)POSTERIOR LUMBAR INTERBODY FUSION (PLIF) 1 LEVEL N/A 11/04/2012   Procedure: Lumbar four-five Maximum access posterior lumbar interbody fusion with Nuvasive;  Surgeon: Eustace Moore, MD;  Location: [facility];  Service: Neurosurgery;  Laterality: N/A;  Lumbar four-five Maximum access posterior lumbar interbody fusion with Nuvasive  . TONSILLECTOMY    . VENTRAL HERNIA REPAIR  01/24/2011   Procedure: LAPAROSCOPIC VENTRAL HERNIA;  Surgeon: Adin Hector, MD;  Location: [facility];  Service: General;  Laterality: N/A;  with Mesh    Family History  Problem Relation Age of Onset  . Cancer Brother        renal ,also sister  . COPD Brother   . Hypertension Brother   . Diabetes Brother   . Kidney disease Brother   . Diabetes Brother        and sister  . Breast cancer Unknown        sister  . Kidney disease Unknown   . Cancer Mother         Breast Cancer  . Diabetes Mother   . Hypertension Mother   . Glaucoma Mother   . Diabetes Sister   . Hypertension Sister   . Heart disease Sister     Allergies  Allergen Reactions  . K-Dur [Potassium Chloride] Swelling    Cannot tolerate Potassium in any prepared form. Causes Face Swelling  . Nsaids Other (See Comments)    H/o PUD with perforation    Current Outpatient Medications on File Prior to Visit  Medication Sig Dispense Refill  . bifidobacterium infantis (ALIGN) capsule Take 1 capsule by mouth daily.    [name] HYDROcodone-acetaminophen (NORCO) 10-325 MG tablet TAKE 1 TABLET EVERY 5 HOURS AS NEEDED FOR PAIN (DNF 04/08/17)  0   No current facility-administered medications on file prior to visit.     BP (!) 164/80 (BP Location: Right Arm, Patient Position: Sitting, Cuff Size: Large)   Pulse 76   Temp 98.2 F (36.8 C) (Oral)   Wt 160 lb 12.8 oz (72.9 kg)   SpO2 96%   BMI 36.05 kg/m      Review of Systems  Constitutional: Negative.   HENT: Negative for congestion, dental problem, hearing loss, rhinorrhea, sinus pressure, sore throat and tinnitus.   Eyes: Negative for pain, discharge and visual disturbance.  Respiratory: Negative for cough and shortness of breath.   Cardiovascular: Negative for chest pain, palpitations and leg swelling.  Gastrointestinal: Negative for abdominal distention, abdominal pain, blood in stool, constipation, diarrhea, nausea and vomiting.  Genitourinary: Negative for difficulty urinating, dysuria, flank pain, frequency, hematuria, pelvic pain, urgency, vaginal bleeding, vaginal discharge and vaginal pain.  Musculoskeletal: Negative for arthralgias, gait problem and joint swelling.  Skin: Negative for rash.  Neurological: Negative for dizziness, syncope, speech difficulty, weakness, numbness and headaches.  Hematological: Negative for adenopathy.  Psychiatric/Behavioral: Negative for agitation, behavioral problems and dysphoric mood. The patient  is not nervous/anxious.        Objective:   Physical Exam  Constitutional: She is oriented to person, place, and time. She appears well-developed and well-nourished.  Repeat blood pressure 136/80 Slightly anxious Weight 166  HENT:  Head: Normocephalic.  Right Ear: External ear normal.  Left Ear: External ear normal.  Mouth/Throat: Oropharynx is clear and moist.  Eyes: Pupils are equal, round, and reactive to light. Conjunctivae and EOM are normal.  Neck: Normal range  of motion. Neck supple. No thyromegaly present.  Cardiovascular: Normal rate, regular rhythm, normal heart sounds and intact distal pulses.  Decreased left dorsalis pedis pulse  Pulmonary/Chest: Effort normal and breath sounds normal.  Abdominal: Soft. Bowel sounds are normal. She exhibits no mass. There is no tenderness.  Incisional hernia  Musculoskeletal: Normal range of motion.  Lymphadenopathy:    She has no cervical adenopathy.  Neurological: She is alert and oriented to person, place, and time.  Skin: Skin is warm and dry. No rash noted.  Psychiatric: She has a normal mood and affect. Her behavior is normal.          Assessment & Plan:   Essential hypertension History of Takatsubo cardiomyopathy History of diastolic dysfunction History of pulmonary nodule Remote history of perforated gastric ulcer Incisional hernia  Medications updated  Patient will follow-up with a new provider in the next 3 to 6 months  Marletta Lor

## 2017-11-22 DIAGNOSIS — Z23 Encounter for immunization: Secondary | ICD-10-CM | POA: Diagnosis not present

## 2017-11-27 DIAGNOSIS — I10 Essential (primary) hypertension: Secondary | ICD-10-CM | POA: Diagnosis not present

## 2017-11-27 DIAGNOSIS — Z6835 Body mass index (BMI) 35.0-35.9, adult: Secondary | ICD-10-CM | POA: Diagnosis not present

## 2017-11-27 DIAGNOSIS — M48061 Spinal stenosis, lumbar region without neurogenic claudication: Secondary | ICD-10-CM | POA: Diagnosis not present

## 2017-11-27 DIAGNOSIS — M545 Low back pain: Secondary | ICD-10-CM | POA: Diagnosis not present

## 2018-02-09 DIAGNOSIS — Z961 Presence of intraocular lens: Secondary | ICD-10-CM | POA: Diagnosis not present

## 2018-02-09 DIAGNOSIS — H35371 Puckering of macula, right eye: Secondary | ICD-10-CM | POA: Diagnosis not present

## 2018-02-12 DIAGNOSIS — Z6836 Body mass index (BMI) 36.0-36.9, adult: Secondary | ICD-10-CM | POA: Diagnosis not present

## 2018-02-12 DIAGNOSIS — I10 Essential (primary) hypertension: Secondary | ICD-10-CM | POA: Diagnosis not present

## 2018-04-30 ENCOUNTER — Encounter: Payer: Medicare Other | Admitting: Internal Medicine

## 2018-04-30 DIAGNOSIS — M545 Low back pain: Secondary | ICD-10-CM | POA: Diagnosis not present

## 2018-04-30 DIAGNOSIS — M48061 Spinal stenosis, lumbar region without neurogenic claudication: Secondary | ICD-10-CM | POA: Diagnosis not present

## 2018-05-27 ENCOUNTER — Ambulatory Visit (INDEPENDENT_AMBULATORY_CARE_PROVIDER_SITE_OTHER): Payer: Medicare Other | Admitting: Internal Medicine

## 2018-05-27 ENCOUNTER — Other Ambulatory Visit: Payer: Self-pay

## 2018-05-27 DIAGNOSIS — K259 Gastric ulcer, unspecified as acute or chronic, without hemorrhage or perforation: Secondary | ICD-10-CM

## 2018-05-27 DIAGNOSIS — R197 Diarrhea, unspecified: Secondary | ICD-10-CM | POA: Diagnosis not present

## 2018-05-27 DIAGNOSIS — Z5320 Procedure and treatment not carried out because of patient's decision for unspecified reasons: Secondary | ICD-10-CM

## 2018-05-27 DIAGNOSIS — Z1329 Encounter for screening for other suspected endocrine disorder: Secondary | ICD-10-CM

## 2018-05-27 DIAGNOSIS — I10 Essential (primary) hypertension: Secondary | ICD-10-CM | POA: Diagnosis not present

## 2018-05-27 DIAGNOSIS — G8929 Other chronic pain: Secondary | ICD-10-CM

## 2018-05-27 DIAGNOSIS — E559 Vitamin D deficiency, unspecified: Secondary | ICD-10-CM | POA: Diagnosis not present

## 2018-05-27 DIAGNOSIS — R7303 Prediabetes: Secondary | ICD-10-CM

## 2018-05-27 DIAGNOSIS — Z13818 Encounter for screening for other digestive system disorders: Secondary | ICD-10-CM | POA: Diagnosis not present

## 2018-05-27 DIAGNOSIS — R911 Solitary pulmonary nodule: Secondary | ICD-10-CM | POA: Diagnosis not present

## 2018-05-27 DIAGNOSIS — M549 Dorsalgia, unspecified: Secondary | ICD-10-CM

## 2018-05-27 DIAGNOSIS — Z1389 Encounter for screening for other disorder: Secondary | ICD-10-CM | POA: Diagnosis not present

## 2018-05-27 MED ORDER — OMEPRAZOLE 40 MG PO CPDR: 40.0000 mg | DELAYED_RELEASE_CAPSULE | Freq: Every day | ORAL | 3 refills | Status: DC

## 2018-05-27 MED ORDER — AMLODIPINE BESYLATE 10 MG PO TABS: 10.0000 mg | ORAL_TABLET | Freq: Every day | ORAL | 3 refills | Status: DC

## 2018-05-27 MED ORDER — HYDROCHLOROTHIAZIDE 12.5 MG PO TABS: 12.5000 mg | ORAL_TABLET | Freq: Every day | ORAL | 3 refills | Status: DC

## 2018-05-27 MED ORDER — ALIGN PO CAPS: 1.0000 | ORAL_CAPSULE | Freq: Every day | ORAL | 3 refills | Status: AC

## 2018-05-27 NOTE — Progress Notes (Signed)
Telephone Note  I connected with Sally Douglas   on 05/27/18 at  2:10 PM EDT by phone and verified that I am speaking with the correct person using two identifiers.  Location patient: home Location provider:work Persons participating in the virtual visit: patient, provider  I discussed the limitations of evaluation and management by telemedicine and the availability of in person appointments. The patient expressed understanding and agreed to proceed.   HPI: TOC former pt of Dr. Raliegh Ip  1. HTN uncontrolled not checking BP regularly last BP check was 158/90 on norvasc 10 mg qd c/o ankle swelling she was on lasix prn and hctz 12.5 mg qd years ago  BP in the past was low noromal on ACEI (lis 5-20 mg qd) and BB (Metop 50 mg bid) and also she was stopped on Imdur, hydralazine, Cardizem CD 360   She has not had recent labs since 10/2016  She needs refills on all medications by PCP  2. Chronic back pain f/u [facility] in [place] Dr. Shearon Stalls is her doctor and seh is on norco 10-325 mg Q5-6 hrs prn   3. 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 Systolic function was   normal. The estimated ejection fraction was in the range of 55%   to 60%. Wall motion was normal; there were no regional wall   motion abnormalities. Doppler parameters are consistent with   abnormal left ventricular relaxation (grade 1 diastolic   dysfunction).  Impressions:  - When compared to prior, wall motion is now normal.  4. H/o gastric ulcer needs refill of prilosec  ROS: See pertinent positives and negatives per HPI.  Past Medical History:  Diagnosis Date  . Abnormal finding on Pap smear    hx abnl pap  . Arthritis   . Bronchitis    hx of-many yrs ago  . Constipation    takes Miralax daily  . H/O hernia repair    with mesh   . Hypertension    takes Diltiazem and HCTZ daily  .  Insomnia    takes Ambien nightly  . Joint pain   . Joint swelling   . Low back pain   . Menopausal syndrome   . Obesity   . Perforated gastric ulcer ([place])   . Tobacco abuse     Past Surgical History:  Procedure Laterality Date  . CHOLECYSTECTOMY N/A 05/05/2015   Procedure: LAPAROSCOPIC CHOLECYSTECTOMY;  Surgeon: Rolm Bookbinder, MD;  Location: [facility];  Service: General;  Laterality: N/A;  . DILATION AND CURETTAGE OF UTERUS    . LAPAROTOMY N/A 10/16/2014   Procedure: EXPLORATORY LAPAROTOMY, REPAIR OF GASTRIC ULCER;  Surgeon: Georganna Skeans, MD;  Location: [facility];  Service: General;  Laterality: N/A;  . MAXIMUM ACCESS (MAS)POSTERIOR LUMBAR INTERBODY FUSION (PLIF) 1 LEVEL N/A 11/04/2012   Procedure: Lumbar four-five Maximum access posterior lumbar interbody fusion with Harlin Heys;  Surgeon: Eustace Moore, MD;  Location: [facility];  Service: Neurosurgery;  Laterality: N/A;  Lumbar four-five Maximum access posterior lumbar interbody fusion with Nuvasive  . TONSILLECTOMY    . VENTRAL HERNIA REPAIR  01/24/2011   Procedure: LAPAROSCOPIC VENTRAL HERNIA;  Surgeon: Adin Hector, MD;  Location: [facility];  Service: General;  Laterality: N/A;  with Mesh    Family History  Problem Relation Age of Onset  . Cancer Brother  renal ,also sister, skin cancer   . COPD Brother   . Hypertension Brother   . Diabetes Brother   . Kidney disease Brother   . Cancer Mother        Breast Cancer  . Diabetes Mother   . Hypertension Mother   . Glaucoma Mother   . Diabetes Sister   . Hypertension Sister   . Heart disease Sister   . Pancreatic cancer Sister   . Diabetes Brother        and sister  . Breast cancer Other        sister  . Kidney disease Other     SOCIAL HX: 1 daughter, married    Current Outpatient Medications:  .  amLODipine (NORVASC) 10 MG tablet, Take 1 tablet (10 mg total) by mouth daily., Disp: 90 tablet, Rfl: 3 .  bifidobacterium infantis (ALIGN) capsule, Take 1 capsule by  mouth daily., Disp: 90 capsule, Rfl: 3 .  hydrochlorothiazide (HYDRODIURIL) 12.5 MG tablet, Take 1 tablet (12.5 mg total) by mouth daily. In am, Disp: 90 tablet, Rfl: 3 .  HYDROcodone-acetaminophen (NORCO) 10-325 MG tablet, Take 1 tablet by mouth every 6 (six) hours as needed., Disp: 30 tablet, Rfl: 0 .  omeprazole (PRILOSEC) 40 MG capsule, Take 1 capsule (40 mg total) by mouth daily., Disp: 90 capsule, Rfl: 3 .  polyethylene glycol (MIRALAX / GLYCOLAX) packet, Take 17 g by mouth daily as needed for mild constipation., Disp: 30 each, Rfl: 6 .  VOLTAREN 1 % GEL, Apply 1 application topically daily as needed. FOR HANDS, Disp: 100 g, Rfl: 6  EXAM:  VITALS per patient if applicable:  GENERAL: alert, oriented, appears well and in no acute distress  PSYCH/NEURO: pleasant and cooperative, no obvious depression or anxiety, speech and thought processing grossly intact  ASSESSMENT AND PLAN:  Discussed the following assessment and plan:  Gastric ulcer, unspecified chronicity, unspecified whether gastric ulcer hemorrhage or perforation present - Plan: omeprazole (PRILOSEC) 40 MG capsule  Essential hypertension - Plan: amLODipine (NORVASC) 10 MG tablet, add hydrochlorothiazide (HYDRODIURIL) 12.5 MG tablet qd  Fasting labs  Monitor bp   Prediabetes A1C 6.4   Chronic back pain, unspecified back location, unspecified back pain laterality - Plan: HYDROcodone-acetaminophen (NORCO) 10-325 MG tablet f/u [facility] in [place] Dr. Shearon Stalls   Solitary pulmonary nodule benign per last CT 2018   HM Flu shot utd  pna 23 consider in future  prevnar utd  Tdap consider 06/2018  Consider shingrix in future   Out of age window pap  Colonoscopy and mammogram pt refuses disc'ed today  Declines DEXA  Pt denies skin lesions at f/u will need to look at skin never seen derm  Hep C neg  Former smoker 1/2 ppd age max started age 41 y.o last cig in 2016 smoked 1/2 ppd x 15 years CT 2018 lung nodule but benign after  serial f/u      I discussed the assessment and treatment plan with the patient. The patient was provided an opportunity to ask questions and all were answered. The patient agreed with the plan and demonstrated an understanding of the instructions.   The patient was advised to call back or seek an in-person evaluation if the symptoms worsen or if the condition fails to improve as anticipated.  Time spent 20 minutes  Delorise Jackson, MD

## 2018-05-28 ENCOUNTER — Other Ambulatory Visit (INDEPENDENT_AMBULATORY_CARE_PROVIDER_SITE_OTHER): Payer: Medicare Other

## 2018-05-28 DIAGNOSIS — I10 Essential (primary) hypertension: Secondary | ICD-10-CM | POA: Diagnosis not present

## 2018-05-28 DIAGNOSIS — Z1329 Encounter for screening for other suspected endocrine disorder: Secondary | ICD-10-CM | POA: Diagnosis not present

## 2018-05-28 DIAGNOSIS — Z1389 Encounter for screening for other disorder: Secondary | ICD-10-CM

## 2018-05-28 DIAGNOSIS — E559 Vitamin D deficiency, unspecified: Secondary | ICD-10-CM | POA: Diagnosis not present

## 2018-05-28 DIAGNOSIS — R739 Hyperglycemia, unspecified: Secondary | ICD-10-CM | POA: Diagnosis not present

## 2018-05-28 DIAGNOSIS — Z13818 Encounter for screening for other digestive system disorders: Secondary | ICD-10-CM | POA: Diagnosis not present

## 2018-05-28 LAB — CBC WITH DIFFERENTIAL/PLATELET
Basophils Absolute: 0.1 10*3/uL (ref 0.0–0.1)
Basophils Relative: 0.9 % (ref 0.0–3.0)
Eosinophils Absolute: 0.4 10*3/uL (ref 0.0–0.7)
Eosinophils Relative: 3.6 % (ref 0.0–5.0)
HCT: 41.2 % (ref 36.0–46.0)
Hemoglobin: 13.7 g/dL (ref 12.0–15.0)
Lymphocytes Relative: 30.3 % (ref 12.0–46.0)
Lymphs Abs: 3.1 10*3/uL (ref 0.7–4.0)
MCHC: 33.4 g/dL (ref 30.0–36.0)
MCV: 85.9 fl (ref 78.0–100.0)
Monocytes Absolute: 0.6 10*3/uL (ref 0.1–1.0)
Monocytes Relative: 5.8 % (ref 3.0–12.0)
Neutro Abs: 6.2 10*3/uL (ref 1.4–7.7)
Neutrophils Relative %: 59.4 % (ref 43.0–77.0)
Platelets: 274 10*3/uL (ref 150.0–400.0)
RBC: 4.79 Mil/uL (ref 3.87–5.11)
RDW: 14.2 % (ref 11.5–15.5)
WBC: 10.4 10*3/uL (ref 4.0–10.5)

## 2018-05-28 LAB — LIPID PANEL
Cholesterol: 154 mg/dL (ref 0–200)
HDL: 57.1 mg/dL (ref >39.00)
LDL Cholesterol: 77 mg/dL (ref 0–99)
NonHDL: 97.02
Total CHOL/HDL Ratio: 3
Triglycerides: 100 mg/dL (ref 0.0–149.0)
VLDL: 20 mg/dL (ref 0.0–40.0)

## 2018-05-28 LAB — COMPREHENSIVE METABOLIC PANEL
ALT: 14 U/L (ref 0–35)
AST: 16 U/L (ref 0–37)
Albumin: 4.2 g/dL (ref 3.5–5.2)
Alkaline Phosphatase: 95 U/L (ref 39–117)
BUN: 18 mg/dL (ref 6–23)
CO2: 28 mEq/L (ref 19–32)
Calcium: 9.4 mg/dL (ref 8.4–10.5)
Chloride: 101 mEq/L (ref 96–112)
Creatinine, Ser: 0.96 mg/dL (ref 0.40–1.20)
GFR: 56.96 mL/min — ABNORMAL LOW (ref >60.00)
Glucose, Bld: 111 mg/dL — ABNORMAL HIGH (ref 70–99)
Potassium: 4 mEq/L (ref 3.5–5.1)
Sodium: 139 mEq/L (ref 135–145)
Total Bilirubin: 0.5 mg/dL (ref 0.2–1.2)
Total Protein: 7.3 g/dL (ref 6.0–8.3)

## 2018-05-28 LAB — VITAMIN D 25 HYDROXY (VIT D DEFICIENCY, FRACTURES): VITD: 35.75 ng/mL (ref 30.00–100.00)

## 2018-05-28 LAB — TSH: TSH: 0.7 u[IU]/mL (ref 0.35–4.50)

## 2018-05-28 LAB — HEMOGLOBIN A1C: Hgb A1c MFr Bld: 6.4 % (ref 4.6–6.5)

## 2018-05-29 ENCOUNTER — Encounter: Payer: Self-pay | Admitting: Internal Medicine

## 2018-05-29 DIAGNOSIS — E119 Type 2 diabetes mellitus without complications: Secondary | ICD-10-CM | POA: Insufficient documentation

## 2018-05-29 DIAGNOSIS — R7303 Prediabetes: Secondary | ICD-10-CM | POA: Insufficient documentation

## 2018-05-29 LAB — URINALYSIS, ROUTINE W REFLEX MICROSCOPIC
Bilirubin, UA: NEGATIVE
Glucose, UA: NEGATIVE
Ketones, UA: NEGATIVE
Leukocytes,UA: NEGATIVE
Nitrite, UA: NEGATIVE
RBC, UA: NEGATIVE
Specific Gravity, UA: 1.021 (ref 1.005–1.030)
Urobilinogen, Ur: 0.2 mg/dL (ref 0.2–1.0)
pH, UA: 6.5 (ref 5.0–7.5)

## 2018-05-29 LAB — HEPATITIS C ANTIBODY
Hepatitis C Ab: NONREACTIVE
SIGNAL TO CUT-OFF: 0.01 (ref <1.00)

## 2018-05-29 MED ORDER — HYDROCODONE-ACETAMINOPHEN 10-325 MG PO TABS: 1.0000 | ORAL_TABLET | Freq: Four times a day (QID) | ORAL | 0 refills | Status: DC | PRN

## 2018-07-28 DIAGNOSIS — M48061 Spinal stenosis, lumbar region without neurogenic claudication: Secondary | ICD-10-CM | POA: Diagnosis not present

## 2018-07-28 DIAGNOSIS — M545 Low back pain: Secondary | ICD-10-CM | POA: Diagnosis not present

## 2018-08-21 ENCOUNTER — Other Ambulatory Visit: Payer: Self-pay | Admitting: Internal Medicine

## 2018-08-21 DIAGNOSIS — K259 Gastric ulcer, unspecified as acute or chronic, without hemorrhage or perforation: Secondary | ICD-10-CM

## 2018-08-21 MED ORDER — OMEPRAZOLE 40 MG PO CPDR: 40.0000 mg | DELAYED_RELEASE_CAPSULE | Freq: Every day | ORAL | 3 refills | Status: DC

## 2018-10-08 DIAGNOSIS — M48061 Spinal stenosis, lumbar region without neurogenic claudication: Secondary | ICD-10-CM | POA: Diagnosis not present

## 2018-10-08 DIAGNOSIS — M545 Low back pain: Secondary | ICD-10-CM | POA: Diagnosis not present

## 2018-10-30 ENCOUNTER — Other Ambulatory Visit: Payer: Self-pay

## 2018-10-30 ENCOUNTER — Ambulatory Visit (INDEPENDENT_AMBULATORY_CARE_PROVIDER_SITE_OTHER): Payer: Medicare Other

## 2018-10-30 DIAGNOSIS — Z0000 Encounter for general adult medical examination without abnormal findings: Secondary | ICD-10-CM | POA: Diagnosis not present

## 2018-10-30 NOTE — Progress Notes (Signed)
Subjective:   Sally Douglas is a 73 y.o. female who presents for Medicare Annual (Subsequent) preventive examination.  Review of Systems:  No ROS.  Medicare Wellness Virtual Visit.  Visual/audio telehealth visit, UTA vital signs.   See social history for additional risk factors.   Cardiac Risk Factors include: advanced age (>36men, >45 women);hypertension;sedentary lifestyle     Objective:     Vitals: There were no vitals taken for this visit.  There is no height or weight on file to calculate BMI.  Advanced Directives 10/30/2018 05/02/2015 05/02/2015 10/16/2014 10/27/2012 01/24/2011 01/16/2011  Does Patient Have a Medical Advance Directive? No No No No Patient does not have advance directive;Patient would like information Patient does not have advance directive Patient does not have advance directive;Patient would not like information  Would patient like information on creating a medical advance directive? No - Patient declined - - - - - -    Tobacco Social History   Tobacco Use  Smoking Status Former Smoker  . Packs/day: 0.25  . Years: 25.00  . Pack years: 6.25  . Types: Cigarettes  . Quit date: 10/16/2014  . Years since quitting: 4.0  Smokeless Tobacco Never Used  Tobacco Comment   Used Electronic Cigarettes until quit     Counseling given: Not Answered Comment: Used Electronic Cigarettes until quit   Clinical Intake:  Pre-visit preparation completed: Yes        Diabetes: No  How often do you need to have someone help you when you read instructions, pamphlets, or other written materials from your doctor or pharmacy?: 1 - Never  Interpreter Needed?: No     Past Medical History:  Diagnosis Date  . Abnormal finding on Pap smear    hx abnl pap  . Arthritis   . Bronchitis    hx of-many yrs ago  . Constipation    takes Miralax daily  . H/O hernia repair    with mesh   . Hypertension    takes Diltiazem and HCTZ daily  . Insomnia    takes Ambien nightly   . Joint pain   . Joint swelling   . Low back pain   . Menopausal syndrome   . Obesity   . Perforated gastric ulcer ([place])   . Tobacco abuse    Past Surgical History:  Procedure Laterality Date  . CHOLECYSTECTOMY N/A 05/05/2015   Procedure: LAPAROSCOPIC CHOLECYSTECTOMY;  Surgeon: Rolm Bookbinder, MD;  Location: [facility];  Service: General;  Laterality: N/A;  . DILATION AND CURETTAGE OF UTERUS    . LAPAROTOMY N/A 10/16/2014   Procedure: EXPLORATORY LAPAROTOMY, REPAIR OF GASTRIC ULCER;  Surgeon: Georganna Skeans, MD;  Location: [facility];  Service: General;  Laterality: N/A;  . MAXIMUM ACCESS (MAS)POSTERIOR LUMBAR INTERBODY FUSION (PLIF) 1 LEVEL N/A 11/04/2012   Procedure: Lumbar four-five Maximum access posterior lumbar interbody fusion with Harlin Heys;  Surgeon: Eustace Moore, MD;  Location: [facility];  Service: Neurosurgery;  Laterality: N/A;  Lumbar four-five Maximum access posterior lumbar interbody fusion with Nuvasive  . TONSILLECTOMY    . VENTRAL HERNIA REPAIR  01/24/2011   Procedure: LAPAROSCOPIC VENTRAL HERNIA;  Surgeon: Adin Hector, MD;  [facility];  Service: General;  Laterality: N/A;  with Mesh   Family History  Problem Relation Age of Onset  . Cancer Brother        renal ,also sister, skin cancer   . COPD Brother   . Hypertension Brother   . Diabetes Brother   .  Kidney disease Brother   . Cancer Mother        Breast Cancer  . Diabetes Mother   . Hypertension Mother   . Glaucoma Mother   . Diabetes Sister   . Hypertension Sister   . Heart disease Sister   . Pancreatic cancer Sister   . Diabetes Brother        and sister  . Breast cancer Other        sister  . Kidney disease Other    Social History   Socioeconomic History  . Marital status: Married    Spouse name: Not on file  . Number of children: Not on file  . Years of education: Not on file  . Highest education level: Not on file  Occupational History  . Occupation: retired  Scientific laboratory technician  .  Financial resource strain: Not hard at all  . Food insecurity    Worry: Never true    Inability: Never true  . Transportation needs    Medical: No    Non-medical: No  Tobacco Use  . Smoking status: Former Smoker    Packs/day: 0.25    Years: 25.00    Pack years: 6.25    Types: Cigarettes    Quit date: 10/16/2014    Years since quitting: 4.0  . Smokeless tobacco: Never Used  . Tobacco comment: Used Electronic Cigarettes until quit  Substance and Sexual Activity  . Alcohol use: No    Alcohol/week: 0.0 standard drinks  . Drug use: No  . Sexual activity: Not Currently    Birth control/protection: Post-menopausal  Lifestyle  . Physical activity    Days per week: 0 days    Minutes per session: Not on file  . Stress: Not on file  Relationships  . Social Herbalist on phone: Not on file    Gets together: Not on file    Attends religious service: Not on file    Active member of club or organization: Not on file    Attends meetings of clubs or organizations: Not on file    Relationship status: Not on file  Other Topics Concern  . Not on file  Social History Narrative   gavidia 1   Para 1    aborta 0   1 daughter        Outpatient Encounter Medications as of 10/30/2018  Medication Sig  . amLODipine (NORVASC) 10 MG tablet Take 1 tablet (10 mg total) by mouth daily.  . bifidobacterium infantis (ALIGN) capsule Take 1 capsule by mouth daily.  . hydrochlorothiazide (HYDRODIURIL) 12.5 MG tablet Take 1 tablet (12.5 mg total) by mouth daily. In am  . HYDROcodone-acetaminophen (NORCO) 10-325 MG tablet Take 1 tablet by mouth every 6 (six) hours as needed.  [name] omeprazole (PRILOSEC) 40 MG capsule Take 1 capsule (40 mg total) by mouth daily.  . polyethylene glycol (MIRALAX / GLYCOLAX) packet Take 17 g by mouth daily as needed for mild constipation.  . VOLTAREN 1 % GEL Apply 1 application topically daily as needed. FOR HANDS   No facility-administered encounter medications on  file as of 10/30/2018.     Activities of Daily Living In your present state of health, do you have any difficulty performing the following activities: 10/30/2018  Hearing? N  Vision? N  Difficulty concentrating or making decisions? N  Walking or climbing stairs? N  Dressing or bathing? N  Doing errands, shopping? N  Preparing Food and eating ? N  Using  the Toilet? N  In the past six months, have you accidently leaked urine? N  Do you have problems with loss of bowel control? N  Managing your Medications? N  Managing your Finances? N  Housekeeping or managing your Housekeeping? N  Some recent data might be hidden    Patient Care Team: McLean-Scocuzza, Nino Glow, MD as PCP - General (Internal Medicine)    Assessment:   This is a routine wellness examination for [place].  I connected with patient 10/30/18 at 12:30 PM EDT by an audio enabled telemedicine application and verified that I am speaking with the correct person using two identifiers. Patient stated full name and DOB. Patient gave permission to continue with virtual visit. Patient's location was at home and Nurse's location was at [place] office.   Health Maintenance Due: -Influenza vaccine 2020- discussed; to be completed in season with doctor or local pharmacy.   -Tdap- discussed; to be completed with doctor in visit or local pharmacy.   Update all pending maintenance due as appropriate.   See completed HM at the end of note.   Eye: Visual acuity not assessed. Virtual visit. Wears corrective lenses. Followed by their ophthalmologist every 12 months. Cataracts extracted.  Dental: UTD Dentures- upper  Hearing: Demonstrates normal hearing during visit.  Safety:  Patient feels safe at home- yes Patient does have smoke detectors at home- yes Patient does wear sunscreen or protective clothing when in direct sunlight - yes Patient does wear seat belt when in a moving vehicle - yes Patient drives- yes Adequate lighting in  walkways free from debris- yes Grab bars and handrails used as appropriate- yes Ambulates with no assistive device Cell phone on person when ambulating outside of the home- yes  Social: Alcohol intake - no       Smoking history- former   Smokers in home? none Illicit drug use? none  Depression: PHQ 2 &9 complete. See screening below. Denies irritability, anhedonia, sadness/tearfullness.    Falls: See screening below.    Medication: Taking as directed and without issues.   Covid-19: Precautions and sickness symptoms discussed. Wears mask, social distancing, hand hygiene as appropriate.   Activities of Daily Living Patient denies needing assistance with: household chores, feeding themselves, getting from bed to chair, getting to the toilet, bathing/showering, dressing, managing money, or preparing meals.   Memory: Patient is alert. Patient denies difficulty focusing or concentrating. Correctly identified the president of the [place], season and recall.  BMI- discussed the importance of a healthy diet, water intake and the benefits of aerobic exercise.  Educational material provided.  Physical activity- no routine  Diet: Regular Water: good intake  Other Providers Patient Care Team: McLean-Scocuzza, Nino Glow, MD as PCP - General (Internal Medicine)  Exercise Activities and Dietary recommendations Current Exercise Habits: The patient does not participate in regular exercise at present  Goals      Patient Stated   . DIET - Low carb diet (pt-stated)     I know I am supposed to watch what I eat. I do not want to have diabetes.        Fall Risk Fall Risk  10/30/2018 03/16/2015 11/29/2013 10/29/2012  Falls in the past year? 0 No No No  Risk for fall due to : - - - Impaired balance/gait;Impaired mobility   Timed Get Up and Go performed: no, virtual visit  Depression Screen PHQ 2/9 Scores 10/30/2018 03/16/2015 11/29/2013 10/29/2012  PHQ - 2 Score 0 0 0 0  Cognitive  Function     6CIT Screen 10/30/2018  What Year? 0 points  What month? 0 points  What time? 0 points  Count back from 20 0 points  Months in reverse 0 points  Repeat phrase 0 points  Total Score 0    Immunization History  Administered Date(s) Administered  . Influenza, High Dose Seasonal PF 11/15/2015, 11/04/2016, 11/22/2017  . Influenza,inj,Quad PF,6+ Mos 11/05/2012, 11/29/2013, 10/18/2014  . Pneumococcal Conjugate-13 11/29/2013  . Pneumococcal Polysaccharide-23 05/16/2011  . Td 07/15/2008   Screening Tests Health Maintenance  Topic Date Due  . TETANUS/TDAP  07/16/2018  . INFLUENZA VACCINE  08/29/2018  . MAMMOGRAM  05/27/2019 (Originally 04/29/1995)  . DEXA SCAN  05/27/2019 (Originally 04/29/2010)  . COLONOSCOPY  05/27/2019 (Originally 04/29/1995)  . Hepatitis C Screening  Completed  . PNA vac Low Risk Adult  Completed      Plan:   Keep all routine maintenance appointments.   Follow up 11/03/18 @ 10:00  Medicare Attestation I have personally reviewed: The patient's medical and social history Their use of alcohol, tobacco or illicit drugs Their current medications and supplements The patient's functional ability including ADLs,fall risks, home safety risks, cognitive, and hearing and visual impairment Diet and physical activities Evidence for depression   In addition, I have reviewed and discussed with patient certain preventive protocols, quality metrics, and best practice recommendations. A written personalized care plan for preventive services as well as general preventive health recommendations were provided to patient via mail.     Varney Biles, LPN  [phone]

## 2018-10-30 NOTE — Patient Instructions (Signed)
  Ms. Jablonowski , Thank you for taking time to come for your Medicare Wellness Visit. I appreciate your ongoing commitment to your health goals. Please review the following plan we discussed and let me know if I can assist you in the future.   These are the goals we discussed: Goals      Patient Stated   . DIET - Low carb diet (pt-stated)     I know I am supposed to watch what I eat. I do not want to have diabetes.        This is a list of the screening recommended for you and due dates:  Health Maintenance  Topic Date Due  . Tetanus Vaccine  07/16/2018  . Flu Shot  08/29/2018  . Mammogram  05/27/2019*  . DEXA scan (bone density measurement)  05/27/2019*  . Colon Cancer Screening  05/27/2019*  .  Hepatitis C: One time screening is recommended by Center for Disease Control  (CDC) for  adults born from [birth year] through 1965.   Completed  . Pneumonia vaccines  Completed  *Topic was postponed. The date shown is not the original due date.

## 2018-11-03 ENCOUNTER — Other Ambulatory Visit: Payer: Self-pay

## 2018-11-03 ENCOUNTER — Encounter: Payer: Self-pay | Admitting: Internal Medicine

## 2018-11-03 ENCOUNTER — Ambulatory Visit (INDEPENDENT_AMBULATORY_CARE_PROVIDER_SITE_OTHER): Payer: Medicare Other | Admitting: Internal Medicine

## 2018-11-03 ENCOUNTER — Other Ambulatory Visit: Payer: Self-pay | Admitting: Internal Medicine

## 2018-11-03 VITALS — BP 166/86 | HR 74 | Temp 97.5°F | Ht <= 58 in | Wt 164.4 lb

## 2018-11-03 DIAGNOSIS — I10 Essential (primary) hypertension: Secondary | ICD-10-CM | POA: Diagnosis not present

## 2018-11-03 DIAGNOSIS — R7303 Prediabetes: Secondary | ICD-10-CM

## 2018-11-03 DIAGNOSIS — K439 Ventral hernia without obstruction or gangrene: Secondary | ICD-10-CM | POA: Diagnosis not present

## 2018-11-03 DIAGNOSIS — Z23 Encounter for immunization: Secondary | ICD-10-CM | POA: Diagnosis not present

## 2018-11-03 MED ORDER — LOSARTAN POTASSIUM-HCTZ 100-25 MG PO TABS: 0.5000 | ORAL_TABLET | Freq: Every day | ORAL | 3 refills | Status: DC

## 2018-11-03 NOTE — Patient Instructions (Addendum)
1/2 pill of losartan 100/25 mg daily in the am  Blood pressure goal <130/<80   Hydrochlorothiazide, HCTZ; Losartan tablets What is this medicine? LOSARTAN; HYDROCHLOROTHIAZIDE (loe SAR tan; hye droe klor oh THYE a zide) is a combination of a drug that relaxes blood vessels and a diuretic. It is used to treat high blood pressure. This medicine may also reduce the risk of stroke in certain patients. This medicine may be used for other purposes; ask your health care provider or pharmacist if you have questions. COMMON BRAND NAME(S): Hyzaar What should I tell my health care provider before I take this medicine? They need to know if you have any of these conditions:  decreased urine  diabetes  kidney disease  liver disease  if you are on a special diet, like a low-salt diet  immune system problems, like lupus  an unusual or allergic reaction to losartan, hydrochlorothiazide, sulfa drugs, other medicines, foods, dyes, or preservatives  pregnant or trying to get pregnant  breast-feeding How should I use this medicine? Take this medicine by mouth with a glass of water. Follow the directions on the prescription label. You can take it with or without food. If it upsets your stomach, take it with food. Take your medicine at regular intervals. Do not take it more often than directed. Do not stop taking except on your doctor's advice. Talk to your pediatrician regarding the use of this medicine in children. Special care may be needed. Overdosage: If you think you have taken too much of this medicine contact a poison control center or emergency room at once. NOTE: This medicine is only for you. Do not share this medicine with others. What if I miss a dose? If you miss a dose, take it as soon as you can. If it is almost time for your next dose, take only that dose. Do not take double or extra doses. What may interact with this medicine?  barbiturates, like phenobarbital  blood pressure  medicines  celecoxib  cimetidine  corticosteroids  diabetic medicines  diuretics, especially triamterene, spironolactone or amiloride  fluconazole  lithium  NSAIDs, medicines for pain and inflammation, like ibuprofen or naproxen  potassium salts or potassium supplements  prescription pain medicines  rifampin  skeletal muscle relaxants like tubocurarine  some cholesterol-lowering medicines like cholestyramine or colestipol This list may not describe all possible interactions. Give your health care provider a list of all the medicines, herbs, non-prescription drugs, or dietary supplements you use. Also tell them if you smoke, drink alcohol, or use illegal drugs. Some items may interact with your medicine. What should I watch for while using this medicine? Check your blood pressure regularly while you are taking this medicine. Ask your doctor or health care professional what your blood pressure should be and when you should contact him or her. When you check your blood pressure, write down the measurements to show your doctor or health care professional. If you are taking this medicine for a long time, you must visit your health care professional for regular checks on your progress. Make sure you schedule appointments on a regular basis. You must not get dehydrated. Ask your doctor or health care professional how much fluid you need to drink a day. Check with him or her if you get an attack of severe diarrhea, nausea and vomiting, or if you sweat a lot. The loss of too much body fluid can make it dangerous for you to take this medicine. Women should inform their doctor  if they wish to become pregnant or think they might be pregnant. There is a potential for serious side effects to an unborn child, particularly in the second or third trimester. Talk to your health care professional or pharmacist for more information. You may get drowsy or dizzy. Do not drive, use machinery, or do anything  that needs mental alertness until you know how this drug affects you. Do not stand or sit up quickly, especially if you are an older patient. This reduces the risk of dizzy or fainting spells. Alcohol can make you more drowsy and dizzy. Avoid alcoholic drinks. This medicine may increase blood sugar. Ask your healthcare provider if changes in diet or medicines are needed if you have diabetes. Avoid salt substitutes unless you are told otherwise by your doctor or health care professional. Do not treat yourself for coughs, colds, or pain while you are taking this medicine without asking your doctor or health care professional for advice. Some ingredients may increase your blood pressure. What side effects may I notice from receiving this medicine? Side effects that you should report to your doctor or health care professional as soon as possible:  allergic reactions like skin rash, itching or hives, swelling of the face, lips, or tongue  breathing problems  changes in vision  dark urine  eye pain  fast or irregular heart beat, palpitations, or chest pain  feeling faint or lightheaded  muscle cramps  persistent dry cough  redness, blistering, peeling or loosening of the skin, including inside the mouth   signs and symptoms of high blood sugar such as being more thirsty or hungry or having to urinate more than normal. You may also feel very tired or have blurry vision.  stomach pain  trouble passing urine  unusual bleeding or bruising  worsened gout pain  yellowing of the eyes or skin Side effects that usually do not require medical attention (report to your doctor or health care professional if they continue or are bothersome):  change in sex drive or performance  headache This list may not describe all possible side effects. Call your doctor for medical advice about side effects. You may report side effects to FDA at 1-800-FDA-1088. Where should I keep my medicine? Keep out  of the reach of children. Store at room temperature between 15 and 30 degrees C (59 and 86 degrees F). Protect from light. Keep container tightly closed. Throw away any unused medicine after the expiration date. NOTE: This sheet is a summary. It may not cover all possible information. If you have questions about this medicine, talk to your doctor, pharmacist, or health care provider.  2020 Elsevier/Gold Standard (2017-11-05 09:42:12)

## 2018-11-03 NOTE — Progress Notes (Signed)
Chief Complaint  Patient presents with  . Follow-up   F/u  1. HTN uncontrolled on norvasc 10 mg qd, hctz 12.5 BP had meds today  2. Energy improved over the last 2 weeks but 2 weeks ago she overdid it in the yard 3. Large hernia s/p repair x 2 with mesh and then ruptured GI ulcer needed repeat no pain but just there Dr. Michael Boston saw in past    Review of Systems  Constitutional: Negative for weight loss.  HENT: Negative for hearing loss.   Eyes: Negative for blurred vision.  Respiratory: Negative for shortness of breath.   Cardiovascular: Negative for chest pain.  Gastrointestinal: Negative for abdominal pain.  Musculoskeletal: Negative for falls.  Skin: Negative for rash.  Neurological: Negative for dizziness.  Psychiatric/Behavioral: Negative for depression.   Past Medical History:  Diagnosis Date  . Abnormal finding on Pap smear    hx abnl pap  . Arthritis   . Bronchitis    hx of-many yrs ago  . Constipation    takes Miralax daily  . H/O hernia repair    with mesh   . Hypertension    takes Diltiazem and HCTZ daily  . Insomnia    takes Ambien nightly  . Joint pain   . Joint swelling   . Low back pain   . Menopausal syndrome   . Obesity   . Perforated gastric ulcer ([place])   . Tobacco abuse    Past Surgical History:  Procedure Laterality Date  . CHOLECYSTECTOMY N/A 05/05/2015   Procedure: LAPAROSCOPIC CHOLECYSTECTOMY;  Surgeon: Rolm Bookbinder, MD;  Location: [facility];  Service: General;  Laterality: N/A;  . DILATION AND CURETTAGE OF UTERUS    . LAPAROTOMY N/A 10/16/2014   Procedure: EXPLORATORY LAPAROTOMY, REPAIR OF GASTRIC ULCER;  Surgeon: Georganna Skeans, MD;  Location: [facility];  Service: General;  Laterality: N/A;  . MAXIMUM ACCESS (MAS)POSTERIOR LUMBAR INTERBODY FUSION (PLIF) 1 LEVEL N/A 11/04/2012   Procedure: Lumbar four-five Maximum access posterior lumbar interbody fusion with Harlin Heys;  Surgeon: Eustace Moore, MD;  Location: [facility];  Service:  Neurosurgery;  Laterality: N/A;  Lumbar four-five Maximum access posterior lumbar interbody fusion with Nuvasive  . TONSILLECTOMY    . VENTRAL HERNIA REPAIR  01/24/2011   Procedure: LAPAROSCOPIC VENTRAL HERNIA;  Surgeon: Adin Hector, MD;  Location: [facility];  Service: General;  Laterality: N/A;  with Mesh   Family History  Problem Relation Age of Onset  . Cancer Brother        renal ,also sister, skin cancer   . COPD Brother   . Hypertension Brother   . Diabetes Brother   . Kidney disease Brother   . Cancer Mother        Breast Cancer  . Diabetes Mother   . Hypertension Mother   . Glaucoma Mother   . Diabetes Sister   . Hypertension Sister   . Heart disease Sister   . Pancreatic cancer Sister   . Diabetes Brother        and sister  . Breast cancer Other        sister  . Kidney disease Other    Social History   Socioeconomic History  . Marital status: Married    Spouse name: Not on file  . Number of children: Not on file  . Years of education: Not on file  . Highest education level: Not on file  Occupational History  . Occupation: retired  Scientific laboratory technician  .  Financial resource strain: Not hard at all  . Food insecurity    Worry: Never true    Inability: Never true  . Transportation needs    Medical: No    Non-medical: No  Tobacco Use  . Smoking status: Former Smoker    Packs/day: 0.25    Years: 25.00    Pack years: 6.25    Types: Cigarettes    Quit date: 10/16/2014    Years since quitting: 4.0  . Smokeless tobacco: Never Used  . Tobacco comment: Used Electronic Cigarettes until quit  Substance and Sexual Activity  . Alcohol use: No    Alcohol/week: 0.0 standard drinks  . Drug use: No  . Sexual activity: Not Currently    Birth control/protection: Post-menopausal  Lifestyle  . Physical activity    Days per week: 0 days    Minutes per session: Not on file  . Stress: Not on file  Relationships  . Social Herbalist on phone: Not on file    Gets  together: Not on file    Attends religious service: Not on file    Active member of club or organization: Not on file    Attends meetings of clubs or organizations: Not on file    Relationship status: Not on file  . Intimate partner violence    Fear of current or ex partner: No    Emotionally abused: No    Physically abused: No    Forced sexual activity: No  Other Topics Concern  . Not on file  Social History Narrative   gavidia 1   Para 1    aborta 0   1 daughter       Current Meds  Medication Sig  . amLODipine (NORVASC) 10 MG tablet Take 1 tablet (10 mg total) by mouth daily.  . bifidobacterium infantis (ALIGN) capsule Take 1 capsule by mouth daily.  [name] HYDROcodone-acetaminophen (NORCO) 10-325 MG tablet Take 1 tablet by mouth every 6 (six) hours as needed.  [name] omeprazole (PRILOSEC) 40 MG capsule Take 1 capsule (40 mg total) by mouth daily.  . polyethylene glycol (MIRALAX / GLYCOLAX) packet Take 17 g by mouth daily as needed for mild constipation.  . VOLTAREN 1 % GEL Apply 1 application topically daily as needed. FOR HANDS  . [DISCONTINUED] hydrochlorothiazide (HYDRODIURIL) 12.5 MG tablet Take 1 tablet (12.5 mg total) by mouth daily. In am   Allergies  Allergen Reactions  . K-Dur [Potassium Chloride] Swelling    Cannot tolerate Potassium in any prepared form. Causes Face Swelling  . Nsaids Other (See Comments)    H/o PUD with perforation   No results found for this or any previous visit (from the past 2160 hour(s)). Objective  Body mass index is 36.86 kg/m. Wt Readings from Last 3 Encounters:  11/03/18 164 lb 6.4 oz (74.6 kg)  10/16/17 160 lb 12.8 oz (72.9 kg)  04/21/17 162 lb 12.8 oz (73.8 kg)   Temp Readings from Last 3 Encounters:  11/03/18 (!) 97.5 F (36.4 C) (Oral)  10/16/17 98.2 F (36.8 C) (Oral)  04/21/17 98 F (36.7 C) (Oral)   BP Readings from Last 3 Encounters:  11/03/18 (!) 166/86  10/16/17 (!) 164/80  04/21/17 140/80   Pulse Readings from Last 3  Encounters:  11/03/18 74  10/16/17 76  04/21/17 79    Physical Exam Vitals signs and nursing note reviewed.  Constitutional:      Appearance: Normal appearance. She is well-developed and well-groomed. She is  obese.  HENT:     Head: Normocephalic and atraumatic.  Eyes:     Conjunctiva/sclera: Conjunctivae normal.     Pupils: Pupils are equal, round, and reactive to light.     Comments: +mask on    Cardiovascular:     Rate and Rhythm: Normal rate and regular rhythm.     Heart sounds: Normal heart sounds. No murmur.  Pulmonary:     Effort: Pulmonary effort is normal.     Breath sounds: Normal breath sounds.  Abdominal:     Tenderness: There is no abdominal tenderness.     Hernia: A hernia is present.       Comments: Large ventral hernia   Skin:    General: Skin is warm and dry.  Neurological:     General: No focal deficit present.     Mental Status: She is alert and oriented to person, place, and time. Mental status is at baseline.     Gait: Gait normal.  Psychiatric:        Attention and Perception: Attention and perception normal.        Mood and Affect: Mood and affect normal.        Speech: Speech normal.        Behavior: Behavior normal. Behavior is cooperative.        Thought Content: Thought content normal.        Cognition and Memory: Cognition and memory normal.        Judgment: Judgment normal.     Assessment  Plan  Essential hypertension - Plan: losartan-hydrochlorothiazide (HYZAAR) 100-25 MG tablet 1/2 pill in am add instead of hctz 12.5 mg qd  Cont norvasc 10 mg qd  Check BMET, A1C at f/u A1C 6.4 prediabetes   Large ventral hernia w/o pain  If cmes refer [facility] in [place] Dr. Johney Maine or Hassell Done   HM Flu shot utd given today pna 23 consider in future  prevnar utd  Tdap consider 06/2018 givne Rx today Consider shingrix in future   Out of age window pap  Colonoscopy and mammogram pt refuses disc'ed prev  Declines cologuard   Declines DEXA  skin lesions   never seen derm declines today Hep C neg  Former smoker 1/2 ppd age max started age 22 y.o last cig in 2016 smoked 1/2 ppd x 15 years CT 2018 lung nodule but benign after serial f/u     Provider: Dr. Olivia Mackie [facility]

## 2018-11-09 ENCOUNTER — Telehealth: Payer: Self-pay

## 2018-11-09 NOTE — Telephone Encounter (Signed)
Copied from [place] [id number] [phone]. Topic: General - Other >> Nov 09, 2018 11:13 AM Carolyn Stare wrote: Pt req call back about medication  losartan-hydrochlorothiazide (HYZAAR) 100-25 MG tablet

## 2018-11-09 NOTE — Telephone Encounter (Signed)
Patient wants to make sure she can take both (HCTZ). She stated that PCP verbally told her yes but the paperwrk says no. She just wants an answer. Should she take both or just the Hyzaar?

## 2018-11-09 NOTE — Telephone Encounter (Signed)
Patient has been informed.

## 2018-11-09 NOTE — Telephone Encounter (Signed)
Her hctz 25 is now in the combined losartan with hctz pill stop hctz alone   TMS

## 2019-01-07 DIAGNOSIS — M545 Low back pain: Secondary | ICD-10-CM | POA: Diagnosis not present

## 2019-01-07 DIAGNOSIS — I10 Essential (primary) hypertension: Secondary | ICD-10-CM | POA: Diagnosis not present

## 2019-01-07 DIAGNOSIS — Z6836 Body mass index (BMI) 36.0-36.9, adult: Secondary | ICD-10-CM | POA: Diagnosis not present

## 2019-02-11 DIAGNOSIS — H35371 Puckering of macula, right eye: Secondary | ICD-10-CM | POA: Diagnosis not present

## 2019-02-11 DIAGNOSIS — H0288A Meibomian gland dysfunction right eye, upper and lower eyelids: Secondary | ICD-10-CM | POA: Diagnosis not present

## 2019-02-11 DIAGNOSIS — Z961 Presence of intraocular lens: Secondary | ICD-10-CM | POA: Diagnosis not present

## 2019-02-11 DIAGNOSIS — H0288B Meibomian gland dysfunction left eye, upper and lower eyelids: Secondary | ICD-10-CM | POA: Diagnosis not present

## 2019-03-24 DIAGNOSIS — I10 Essential (primary) hypertension: Secondary | ICD-10-CM | POA: Diagnosis not present

## 2019-03-24 DIAGNOSIS — Z6837 Body mass index (BMI) 37.0-37.9, adult: Secondary | ICD-10-CM | POA: Diagnosis not present

## 2019-03-24 DIAGNOSIS — M545 Low back pain: Secondary | ICD-10-CM | POA: Diagnosis not present

## 2019-03-24 DIAGNOSIS — M48061 Spinal stenosis, lumbar region without neurogenic claudication: Secondary | ICD-10-CM | POA: Diagnosis not present

## 2019-03-26 ENCOUNTER — Ambulatory Visit: Payer: Medicare Other | Attending: Internal Medicine

## 2019-03-26 DIAGNOSIS — Z23 Encounter for immunization: Secondary | ICD-10-CM

## 2019-03-26 NOTE — Progress Notes (Signed)
   Covid-19 Vaccination Clinic  Name:  Sally Douglas    MRN: [id number] DOB: [birth year]  03/26/2019  Sally Douglas was observed post Covid-19 immunization for 15 minutes without incidence. She was provided with Vaccine Information Sheet and instruction to access the V-Safe system.   Sally Douglas was instructed to call 911 with any severe reactions post vaccine: [name] Difficulty breathing  . Swelling of your face and throat  . A fast heartbeat  . A bad rash all over your body  . Dizziness and weakness    Immunizations Administered    Name Date Dose VIS Date Route   Pfizer COVID-19 Vaccine 03/26/2019 10:53 AM 0.3 mL 01/08/2019 Intramuscular   Manufacturer: [place]   Lot: [id number]   [place]: [id number]

## 2019-04-08 ENCOUNTER — Telehealth: Payer: Self-pay | Admitting: Internal Medicine

## 2019-04-08 NOTE — Telephone Encounter (Signed)
Patient dropped off a handicapp plate form to be filled out by Dr. Olivia Mackie. Form is up front in Dr. Audrie Gallus color folder.

## 2019-04-09 NOTE — Telephone Encounter (Signed)
Placed on your desk. 

## 2019-04-12 NOTE — Telephone Encounter (Signed)
Patient informed and verbalized understanding.  Placed up front for patient

## 2019-04-21 ENCOUNTER — Ambulatory Visit: Payer: Medicare Other | Attending: Internal Medicine

## 2019-04-21 DIAGNOSIS — Z23 Encounter for immunization: Secondary | ICD-10-CM

## 2019-04-21 NOTE — Progress Notes (Signed)
   Covid-19 Vaccination Clinic  Name:  Sally Douglas    MRN: [id number] DOB: [birth year]  04/21/2019  Sally Douglas was observed post Covid-19 immunization for 15 minutes without incident. She was provided with Vaccine Information Sheet and instruction to access the V-Safe system.   Sally Douglas was instructed to call 911 with any severe reactions post vaccine: [name] Difficulty breathing  . Swelling of face and throat  . A fast heartbeat  . A bad rash all over body  . Dizziness and weakness   Immunizations Administered    Name Date Dose VIS Date Route   Pfizer COVID-19 Vaccine 04/21/2019  1:29 PM 0.3 mL 01/08/2019 Intramuscular   Manufacturer: [place]   Lot: [id number]   [place]: [id number]

## 2019-05-05 ENCOUNTER — Other Ambulatory Visit: Payer: Self-pay

## 2019-05-05 ENCOUNTER — Ambulatory Visit (INDEPENDENT_AMBULATORY_CARE_PROVIDER_SITE_OTHER): Payer: Medicare Other | Admitting: Internal Medicine

## 2019-05-05 ENCOUNTER — Encounter: Payer: Self-pay | Admitting: Internal Medicine

## 2019-05-05 VITALS — BP 138/86 | HR 88 | Temp 98.0°F | Ht <= 58 in | Wt 166.8 lb

## 2019-05-05 DIAGNOSIS — K259 Gastric ulcer, unspecified as acute or chronic, without hemorrhage or perforation: Secondary | ICD-10-CM | POA: Diagnosis not present

## 2019-05-05 DIAGNOSIS — R7303 Prediabetes: Secondary | ICD-10-CM

## 2019-05-05 DIAGNOSIS — Z1329 Encounter for screening for other suspected endocrine disorder: Secondary | ICD-10-CM

## 2019-05-05 DIAGNOSIS — I10 Essential (primary) hypertension: Secondary | ICD-10-CM | POA: Diagnosis not present

## 2019-05-05 DIAGNOSIS — Z1389 Encounter for screening for other disorder: Secondary | ICD-10-CM

## 2019-05-05 MED ORDER — OMEPRAZOLE 40 MG PO CPDR: 40.0000 mg | DELAYED_RELEASE_CAPSULE | Freq: Every day | ORAL | 3 refills | Status: DC

## 2019-05-05 MED ORDER — LOSARTAN POTASSIUM-HCTZ 100-25 MG PO TABS: 0.5000 | ORAL_TABLET | Freq: Every day | ORAL | 3 refills | Status: DC

## 2019-05-05 MED ORDER — AMLODIPINE BESYLATE 10 MG PO TABS: 10.0000 mg | ORAL_TABLET | Freq: Every day | ORAL | 3 refills | Status: DC

## 2019-05-05 NOTE — Progress Notes (Signed)
Chief Complaint  Patient presents with  . Follow-up   F/u  1. HTN on norvasc 10 mg qd and hyzaar 100-25 1/2 pill not checking BP at home  Doing well no h/a, dizziness. Leg edema better she did have 4 pieces of country ham today    Review of Systems  Constitutional: Negative for weight loss.  HENT: Negative for hearing loss.   Eyes: Negative for blurred vision.  Respiratory: Negative for shortness of breath.   Cardiovascular: Negative for chest pain and leg swelling.  Musculoskeletal: Negative for falls.  Skin: Negative for rash.  Neurological: Negative for headaches.  Psychiatric/Behavioral: Negative.    Past Medical History:  Diagnosis Date  . Abnormal finding on Pap smear    hx abnl pap  . Arthritis   . Bronchitis    hx of-many yrs ago  . Constipation    takes Miralax daily  . H/O hernia repair    with mesh   . Hypertension    takes Diltiazem and HCTZ daily  . Insomnia    takes Ambien nightly  . Joint pain   . Joint swelling   . Low back pain   . Menopausal syndrome   . Obesity   . Perforated gastric ulcer ([place])   . Tobacco abuse    Past Surgical History:  Procedure Laterality Date  . CHOLECYSTECTOMY N/A 05/05/2015   Procedure: LAPAROSCOPIC CHOLECYSTECTOMY;  Surgeon: Rolm Bookbinder, MD;  Location: [facility];  Service: General;  Laterality: N/A;  . DILATION AND CURETTAGE OF UTERUS    . LAPAROTOMY N/A 10/16/2014   Procedure: EXPLORATORY LAPAROTOMY, REPAIR OF GASTRIC ULCER;  Surgeon: Georganna Skeans, MD;  Location: [facility];  Service: General;  Laterality: N/A;  . MAXIMUM ACCESS (MAS)POSTERIOR LUMBAR INTERBODY FUSION (PLIF) 1 LEVEL N/A 11/04/2012   Procedure: Lumbar four-five Maximum access posterior lumbar interbody fusion with Harlin Heys;  Surgeon: Eustace Moore, MD;  Location: [facility];  Service: Neurosurgery;  Laterality: N/A;  Lumbar four-five Maximum access posterior lumbar interbody fusion with Nuvasive  . TONSILLECTOMY    . VENTRAL HERNIA REPAIR  01/24/2011    Procedure: LAPAROSCOPIC VENTRAL HERNIA;  Surgeon: Adin Hector, MD;  Location: [facility];  Service: General;  Laterality: N/A;  with Mesh   Family History  Problem Relation Age of Onset  . Cancer Brother        renal ,also sister, skin cancer   . COPD Brother   . Hypertension Brother   . Diabetes Brother   . Kidney disease Brother   . Cancer Mother        Breast Cancer  . Diabetes Mother   . Hypertension Mother   . Glaucoma Mother   . Diabetes Sister   . Hypertension Sister   . Heart disease Sister   . Pancreatic cancer Sister   . Diabetes Brother        and sister  . Breast cancer Other        sister  . Kidney disease Other    Social History   Socioeconomic History  . Marital status: Married    Spouse name: Not on file  . Number of children: Not on file  . Years of education: Not on file  . Highest education level: Not on file  Occupational History  . Occupation: retired  Tobacco Use  . Smoking status: Former Smoker    Packs/day: 0.25    Years: 25.00    Pack years: 6.25    Types: Cigarettes  Quit date: 10/16/2014    Years since quitting: 4.5  . Smokeless tobacco: Never Used  . Tobacco comment: Used Electronic Cigarettes until quit  Substance and Sexual Activity  . Alcohol use: No    Alcohol/week: 0.0 standard drinks  . Drug use: No  . Sexual activity: Not Currently    Birth control/protection: Post-menopausal  Other Topics Concern  . Not on file  Social History Narrative   gavidia 1   Para 1    aborta 0   1 daughter       Social Determinants of Health   Financial Resource Strain: Low Risk   . Difficulty of Paying Living Expenses: Not hard at all  Food Insecurity: No Food Insecurity  . Worried About Charity fundraiser in the Last Year: Never true  . Ran Out of Food in the Last Year: Never true  Transportation Needs: No Transportation Needs  . Lack of Transportation (Medical): No  . Lack of Transportation (Non-Medical): No  Physical Activity:  Unknown  . Days of Exercise per Week: 0 days  . Minutes of Exercise per Session: Not on file  Stress:   . Feeling of Stress :   Social Connections:   . Frequency of Communication with Friends and Family:   . Frequency of Social Gatherings with Friends and Family:   . Attends Religious Services:   . Active Member of Clubs or Organizations:   . Attends Archivist Meetings:   [name] Marital Status:   Intimate Partner Violence: Not At Risk  . Fear of Current or Ex-Partner: No  . Emotionally Abused: No  . Physically Abused: No  . Sexually Abused: No   Current Meds  Medication Sig  . amLODipine (NORVASC) 10 MG tablet Take 1 tablet (10 mg total) by mouth daily.  . bifidobacterium infantis (ALIGN) capsule Take 1 capsule by mouth daily.  . Cholecalciferol (VITAMIN D) 125 MCG (5000 UT) CAPS Take by mouth daily.  [name] HYDROcodone-acetaminophen (NORCO) 10-325 MG tablet Take 1 tablet by mouth every 6 (six) hours as needed.  [name] losartan-hydrochlorothiazide (HYZAAR) 100-25 MG tablet Take 0.5 tablets by mouth daily. In am  . omeprazole (PRILOSEC) 40 MG capsule Take 1 capsule (40 mg total) by mouth daily.  . polyethylene glycol (MIRALAX / GLYCOLAX) packet Take 17 g by mouth daily as needed for mild constipation.  . VOLTAREN 1 % GEL Apply 1 application topically daily as needed. FOR HANDS  . [DISCONTINUED] amLODipine (NORVASC) 10 MG tablet Take 1 tablet (10 mg total) by mouth daily.  . [DISCONTINUED] losartan-hydrochlorothiazide (HYZAAR) 100-25 MG tablet Take 0.5 tablets by mouth daily. In am  . [DISCONTINUED] omeprazole (PRILOSEC) 40 MG capsule Take 1 capsule (40 mg total) by mouth daily.   Allergies  Allergen Reactions  . K-Dur [Potassium Chloride] Swelling    Cannot tolerate Potassium in any prepared form. Causes Face Swelling  . Nsaids Other (See Comments)    H/o PUD with perforation   No results found for this or any previous visit (from the past 2160 hour(s)). Objective  Body mass index  is 37.4 kg/m. Wt Readings from Last 3 Encounters:  05/05/19 166 lb 12.8 oz (75.7 kg)  11/03/18 164 lb 6.4 oz (74.6 kg)  10/16/17 160 lb 12.8 oz (72.9 kg)   Temp Readings from Last 3 Encounters:  05/05/19 98 F (36.7 C)  11/03/18 (!) 97.5 F (36.4 C) (Oral)  10/16/17 98.2 F (36.8 C) (Oral)   BP Readings from Last 3 Encounters:  05/05/19 138/86  11/03/18 (!) 166/86  10/16/17 (!) 164/80   Pulse Readings from Last 3 Encounters:  05/05/19 88  11/03/18 74  10/16/17 76    Physical Exam Vitals and nursing note reviewed.  Constitutional:      Appearance: Normal appearance. She is well-developed and well-groomed. She is obese.  HENT:     Head: Normocephalic and atraumatic.  Eyes:     Conjunctiva/sclera: Conjunctivae normal.     Pupils: Pupils are equal, round, and reactive to light.  Cardiovascular:     Rate and Rhythm: Normal rate and regular rhythm.     Heart sounds: Normal heart sounds. No murmur.  Pulmonary:     Effort: Pulmonary effort is normal.     Breath sounds: Normal breath sounds.  Skin:    General: Skin is warm and dry.  Neurological:     General: No focal deficit present.     Mental Status: She is alert and oriented to person, place, and time. Mental status is at baseline.     Gait: Gait normal.  Psychiatric:        Attention and Perception: Attention and perception normal.        Mood and Affect: Mood and affect normal.        Speech: Speech normal.        Behavior: Behavior normal. Behavior is cooperative.        Thought Content: Thought content normal.        Cognition and Memory: Cognition and memory normal.        Judgment: Judgment normal.     Assessment  Plan  Essential hypertension - Plan: losartan-hydrochlorothiazide (HYZAAR) 100-25 MG tablet, amLODipine (NORVASC) 10 MG tablet Fasting labs   Gastric ulcer, unspecified chronicity, unspecified whether gastric ulcer hemorrhage or perforation present - Plan: omeprazole (PRILOSEC) 40 MG capsule    HM Flu shot utd  pna 23 utd   prevnar utd  Tdap consider 06/2018 given Rx previously has Rx Consider shingrix in future  covid 20 2/2   Out of age window pap  Colonoscopy and mammogram pt refuses disc'ed prev  Declines cologuard   Declines DEXA  skin lesions  never seen derm declines today Hep C neg  Former smoker 1/2 ppd age max started age 55 y.o last cig in 2016 smoked 1/2 ppd x 15 years CT 2018 lung nodule but benign after serial f/u   Provider: Dr. Olivia Mackie [facility]

## 2019-05-05 NOTE — Patient Instructions (Signed)
DASH Eating Plan DASH stands for "Dietary Approaches to Stop Hypertension." The DASH eating plan is a healthy eating plan that has been shown to reduce high blood pressure (hypertension). It may also reduce your risk for type 2 diabetes, heart disease, and stroke. The DASH eating plan may also help with weight loss. What are tips for following this plan?  General guidelines  Avoid eating more than 2,300 mg (milligrams) of salt (sodium) a day. If you have hypertension, you may need to reduce your sodium intake to 1,500 mg a day.  Limit alcohol intake to no more than 1 drink a day for nonpregnant women and 2 drinks a day for men. One drink equals 12 oz of beer, 5 oz of wine, or 1 oz of hard liquor.  Work with your health care provider to maintain a healthy body weight or to lose weight. Ask what an ideal weight is for you.  Get at least 30 minutes of exercise that causes your heart to beat faster (aerobic exercise) most days of the week. Activities may include walking, swimming, or biking.  Work with your health care provider or diet and nutrition specialist (dietitian) to adjust your eating plan to your individual calorie needs. Reading food labels   Check food labels for the amount of sodium per serving. Choose foods with less than 5 percent of the Daily Value of sodium. Generally, foods with less than 300 mg of sodium per serving fit into this eating plan.  To find whole grains, look for the word "whole" as the first word in the ingredient list. Shopping  Buy products labeled as "low-sodium" or "no salt added."  Buy fresh foods. Avoid canned foods and premade or frozen meals. Cooking  Avoid adding salt when cooking. Use salt-free seasonings or herbs instead of table salt or sea salt. Check with your health care provider or pharmacist before using salt substitutes.  Do not fry foods. Cook foods using healthy methods such as baking, boiling, grilling, and broiling instead.  Cook with  heart-healthy oils, such as olive, canola, soybean, or sunflower oil. Meal planning  Eat a balanced diet that includes: ? 5 or more servings of fruits and vegetables each day. At each meal, try to fill half of your plate with fruits and vegetables. ? Up to 6-8 servings of whole grains each day. ? Less than 6 oz of lean meat, poultry, or fish each day. A 3-oz serving of meat is about the same size as a deck of cards. One egg equals 1 oz. ? 2 servings of low-fat dairy each day. ? A serving of nuts, seeds, or beans 5 times each week. ? Heart-healthy fats. Healthy fats called Omega-3 fatty acids are found in foods such as flaxseeds and coldwater fish, like sardines, salmon, and mackerel.  Limit how much you eat of the following: ? Canned or prepackaged foods. ? Food that is high in trans fat, such as fried foods. ? Food that is high in saturated fat, such as fatty meat. ? Sweets, desserts, sugary drinks, and other foods with added sugar. ? Full-fat dairy products.  Do not salt foods before eating.  Try to eat at least 2 vegetarian meals each week.  Eat more home-cooked food and less restaurant, buffet, and fast food.  When eating at a restaurant, ask that your food be prepared with less salt or no salt, if possible. What foods are recommended? The items listed may not be a complete list. Talk with your dietitian about   what dietary choices are best for you. Grains Whole-grain or whole-wheat bread. Whole-grain or whole-wheat pasta. Brown rice. Oatmeal. Quinoa. Bulgur. Whole-grain and low-sodium cereals. Pita bread. Low-fat, low-sodium crackers. Whole-wheat flour tortillas. Vegetables Fresh or frozen vegetables (raw, steamed, roasted, or grilled). Low-sodium or reduced-sodium tomato and vegetable juice. Low-sodium or reduced-sodium tomato sauce and tomato paste. Low-sodium or reduced-sodium canned vegetables. Fruits All fresh, dried, or frozen fruit. Canned fruit in natural juice (without  added sugar). Meat and other protein foods Skinless chicken or turkey. Ground chicken or turkey. Pork with fat trimmed off. Fish and seafood. Egg whites. Dried beans, peas, or lentils. Unsalted nuts, nut butters, and seeds. Unsalted canned beans. Lean cuts of beef with fat trimmed off. Low-sodium, lean deli meat. Dairy Low-fat (1%) or fat-free (skim) milk. Fat-free, low-fat, or reduced-fat cheeses. Nonfat, low-sodium ricotta or cottage cheese. Low-fat or nonfat yogurt. Low-fat, low-sodium cheese. Fats and oils Soft margarine without trans fats. Vegetable oil. Low-fat, reduced-fat, or light mayonnaise and salad dressings (reduced-sodium). Canola, safflower, olive, soybean, and sunflower oils. Avocado. Seasoning and other foods Herbs. Spices. Seasoning mixes without salt. Unsalted popcorn and pretzels. Fat-free sweets. What foods are not recommended? The items listed may not be a complete list. Talk with your dietitian about what dietary choices are best for you. Grains Baked goods made with fat, such as croissants, muffins, or some breads. Dry pasta or rice meal packs. Vegetables Creamed or fried vegetables. Vegetables in a cheese sauce. Regular canned vegetables (not low-sodium or reduced-sodium). Regular canned tomato sauce and paste (not low-sodium or reduced-sodium). Regular tomato and vegetable juice (not low-sodium or reduced-sodium). Pickles. Olives. Fruits Canned fruit in a light or heavy syrup. Fried fruit. Fruit in cream or butter sauce. Meat and other protein foods Fatty cuts of meat. Ribs. Fried meat. Bacon. Sausage. Bologna and other processed lunch meats. Salami. Fatback. Hotdogs. Bratwurst. Salted nuts and seeds. Canned beans with added salt. Canned or smoked fish. Whole eggs or egg yolks. Chicken or turkey with skin. Dairy Whole or 2% milk, cream, and half-and-half. Whole or full-fat cream cheese. Whole-fat or sweetened yogurt. Full-fat cheese. Nondairy creamers. Whipped toppings.  Processed cheese and cheese spreads. Fats and oils Butter. Stick margarine. Lard. Shortening. Ghee. Bacon fat. Tropical oils, such as coconut, palm kernel, or palm oil. Seasoning and other foods Salted popcorn and pretzels. Onion salt, garlic salt, seasoned salt, table salt, and sea salt. Worcestershire sauce. Tartar sauce. Barbecue sauce. Teriyaki sauce. Soy sauce, including reduced-sodium. Steak sauce. Canned and packaged gravies. Fish sauce. Oyster sauce. Cocktail sauce. Horseradish that you find on the shelf. Ketchup. Mustard. Meat flavorings and tenderizers. Bouillon cubes. Hot sauce and Tabasco sauce. Premade or packaged marinades. Premade or packaged taco seasonings. Relishes. Regular salad dressings. Where to find more information:  National Heart, Lung, and Blood Institute: www.nhlbi.nih.gov  American Heart Association: www.heart.org Summary  The DASH eating plan is a healthy eating plan that has been shown to reduce high blood pressure (hypertension). It may also reduce your risk for type 2 diabetes, heart disease, and stroke.  With the DASH eating plan, you should limit salt (sodium) intake to 2,300 mg a day. If you have hypertension, you may need to reduce your sodium intake to 1,500 mg a day.  When on the DASH eating plan, aim to eat more fresh fruits and vegetables, whole grains, lean proteins, low-fat dairy, and heart-healthy fats.  Work with your health care provider or diet and nutrition specialist (dietitian) to adjust your eating plan to your   individual calorie needs. This information is not intended to replace advice given to you by your health care provider. Make sure you discuss any questions you have with your health care provider. Document Revised: 12/27/2016 Document Reviewed: 01/08/2016 Elsevier Patient Education  2020 [employer]  

## 2019-05-31 ENCOUNTER — Other Ambulatory Visit (INDEPENDENT_AMBULATORY_CARE_PROVIDER_SITE_OTHER): Payer: Medicare Other

## 2019-05-31 ENCOUNTER — Other Ambulatory Visit: Payer: Self-pay

## 2019-05-31 DIAGNOSIS — Z1329 Encounter for screening for other suspected endocrine disorder: Secondary | ICD-10-CM | POA: Diagnosis not present

## 2019-05-31 DIAGNOSIS — Z1389 Encounter for screening for other disorder: Secondary | ICD-10-CM

## 2019-05-31 DIAGNOSIS — I10 Essential (primary) hypertension: Secondary | ICD-10-CM

## 2019-05-31 DIAGNOSIS — R7303 Prediabetes: Secondary | ICD-10-CM | POA: Diagnosis not present

## 2019-05-31 LAB — CBC WITH DIFFERENTIAL/PLATELET
Basophils Absolute: 0.1 10*3/uL (ref 0.0–0.1)
Basophils Relative: 0.9 % (ref 0.0–3.0)
Eosinophils Absolute: 0.3 10*3/uL (ref 0.0–0.7)
Eosinophils Relative: 2.9 % (ref 0.0–5.0)
HCT: 42.6 % (ref 36.0–46.0)
Hemoglobin: 14.1 g/dL (ref 12.0–15.0)
Lymphocytes Relative: 29.7 % (ref 12.0–46.0)
Lymphs Abs: 2.8 10*3/uL (ref 0.7–4.0)
MCHC: 33 g/dL (ref 30.0–36.0)
MCV: 90.2 fl (ref 78.0–100.0)
Monocytes Absolute: 0.7 10*3/uL (ref 0.1–1.0)
Monocytes Relative: 7.1 % (ref 3.0–12.0)
Neutro Abs: 5.6 10*3/uL (ref 1.4–7.7)
Neutrophils Relative %: 59.4 % (ref 43.0–77.0)
Platelets: 249 10*3/uL (ref 150.0–400.0)
RBC: 4.73 Mil/uL (ref 3.87–5.11)
RDW: 14 % (ref 11.5–15.5)
WBC: 9.4 10*3/uL (ref 4.0–10.5)

## 2019-05-31 LAB — COMPREHENSIVE METABOLIC PANEL
ALT: 14 U/L (ref 0–35)
AST: 13 U/L (ref 0–37)
Albumin: 4.4 g/dL (ref 3.5–5.2)
Alkaline Phosphatase: 87 U/L (ref 39–117)
BUN: 18 mg/dL (ref 6–23)
CO2: 29 mEq/L (ref 19–32)
Calcium: 9.7 mg/dL (ref 8.4–10.5)
Chloride: 98 mEq/L (ref 96–112)
Creatinine, Ser: 1 mg/dL (ref 0.40–1.20)
GFR: 54.19 mL/min — ABNORMAL LOW (ref >60.00)
Glucose, Bld: 119 mg/dL — ABNORMAL HIGH (ref 70–99)
Potassium: 4.7 mEq/L (ref 3.5–5.1)
Sodium: 136 mEq/L (ref 135–145)
Total Bilirubin: 0.6 mg/dL (ref 0.2–1.2)
Total Protein: 7 g/dL (ref 6.0–8.3)

## 2019-05-31 LAB — LIPID PANEL
Cholesterol: 164 mg/dL (ref 0–200)
HDL: 60.7 mg/dL (ref >39.00)
LDL Cholesterol: 76 mg/dL (ref 0–99)
NonHDL: 103.18
Total CHOL/HDL Ratio: 3
Triglycerides: 134 mg/dL (ref 0.0–149.0)
VLDL: 26.8 mg/dL (ref 0.0–40.0)

## 2019-05-31 LAB — HEMOGLOBIN A1C: Hgb A1c MFr Bld: 6.2 % (ref 4.6–6.5)

## 2019-05-31 LAB — TSH: TSH: 0.72 u[IU]/mL (ref 0.35–4.50)

## 2019-05-31 NOTE — Addendum Note (Signed)
Addended by: Leeanne Rio on: 05/31/2019 09:02 AM   Modules accepted: Orders

## 2019-06-01 DIAGNOSIS — M545 Low back pain: Secondary | ICD-10-CM | POA: Diagnosis not present

## 2019-07-01 DIAGNOSIS — M48061 Spinal stenosis, lumbar region without neurogenic claudication: Secondary | ICD-10-CM | POA: Diagnosis not present

## 2019-08-30 DIAGNOSIS — M545 Low back pain: Secondary | ICD-10-CM | POA: Diagnosis not present

## 2019-08-30 DIAGNOSIS — M48061 Spinal stenosis, lumbar region without neurogenic claudication: Secondary | ICD-10-CM | POA: Diagnosis not present

## 2019-11-02 ENCOUNTER — Ambulatory Visit: Payer: Medicare Other

## 2019-11-04 ENCOUNTER — Ambulatory Visit (INDEPENDENT_AMBULATORY_CARE_PROVIDER_SITE_OTHER): Payer: Medicare Other

## 2019-11-04 VITALS — Ht <= 58 in | Wt 166.0 lb

## 2019-11-04 DIAGNOSIS — Z0000 Encounter for general adult medical examination without abnormal findings: Secondary | ICD-10-CM

## 2019-11-04 NOTE — Patient Instructions (Addendum)
Sally Douglas , Thank you for taking time to come for your Medicare Wellness Visit. I appreciate your ongoing commitment to your health goals. Please review the following plan we discussed and let me know if I can assist you in the future.   These are the goals we discussed: Goals    . Follow up with Primary Care Provider     As needed       This is a list of the screening recommended for you and due dates:  Health Maintenance  Topic Date Due  . Tetanus Vaccine  07/16/2018  . Flu Shot  04/27/2020*  . Mammogram  11/03/2020*  . DEXA scan (bone density measurement)  11/03/2020*  . Colon Cancer Screening  11/03/2020*  . COVID-19 Vaccine  Completed  .  Hepatitis C: One time screening is recommended by Center for Disease Control  (CDC) for  adults born from [birth year] through 1965.   Completed  . Pneumonia vaccines  Completed  *Topic was postponed. The date shown is not the original due date.    Immunizations Immunization History  Administered Date(s) Administered  . Fluad Quad(high Dose 65+) 11/03/2018  . Influenza, High Dose Seasonal PF 11/15/2015, 11/04/2016, 11/22/2017  . Influenza,inj,Quad PF,6+ Mos 11/05/2012, 11/29/2013, 10/18/2014  . PFIZER SARS-COV-2 Vaccination 03/26/2019, 04/21/2019  . Pneumococcal Conjugate-13 11/29/2013  . Pneumococcal Polysaccharide-23 05/16/2011  . Td 07/15/2008   Keep all routine maintenance appointments.   Follow up 11/10/19 @ 11:00  Advanced directives: declined  Conditions/risks identified: none new  Follow up in one year for your annual wellness visit    Preventive Care 74 Years and Older, Female Preventive care refers to lifestyle choices and visits with your health care provider that can promote health and wellness. What does preventive care include?  A yearly physical exam. This is also called an annual well check.  Dental exams once or twice a year.  Routine eye exams. Ask your health care provider how often you should have your eyes  checked.  Personal lifestyle choices, including:  Daily care of your teeth and gums.  Regular physical activity.  Eating a healthy diet.  Avoiding tobacco and drug use.  Limiting alcohol use.  Practicing safe sex.  Taking low-dose aspirin every day.  Taking vitamin and mineral supplements as recommended by your health care provider. What happens during an annual well check? The services and screenings done by your health care provider during your annual well check will depend on your age, overall health, lifestyle risk factors, and family history of disease. Counseling  Your health care provider may ask you questions about your:  Alcohol use.  Tobacco use.  Drug use.  Emotional well-being.  Home and relationship well-being.  Sexual activity.  Eating habits.  History of falls.  Memory and ability to understand (cognition).  Work and work Statistician.  Reproductive health. Screening  You may have the following tests or measurements:  Height, weight, and BMI.  Blood pressure.  Lipid and cholesterol levels. These may be checked every 5 years, or more frequently if you are over 74 years old.  Skin check.  Lung cancer screening. You may have this screening every year starting at age 74 if you have a 30-pack-year history of smoking and currently smoke or have quit within the past 15 years.  Fecal occult blood test (FOBT) of the stool. You may have this test every year starting at age 74.  Flexible sigmoidoscopy or colonoscopy. You may have a sigmoidoscopy every 5 years or  a colonoscopy every 10 years starting at age 74.  Hepatitis C blood test.  Hepatitis B blood test.  Sexually transmitted disease (STD) testing.  Diabetes screening. This is done by checking your blood sugar (glucose) after you have not eaten for a while (fasting). You may have this done every 1-3 years.  Bone density scan. This is done to screen for osteoporosis. You may have this done  starting at age 74.  Mammogram. This may be done every 1-2 years. Talk to your health care provider about how often you should have regular mammograms. Talk with your health care provider about your test results, treatment options, and if necessary, the need for more tests. Vaccines  Your health care provider may recommend certain vaccines, such as:  Influenza vaccine. This is recommended every year.  Tetanus, diphtheria, and acellular pertussis (Tdap, Td) vaccine. You may need a Td booster every 10 years.  Zoster vaccine. You may need this after age 74.  Pneumococcal 13-valent conjugate (PCV13) vaccine. One dose is recommended after age 74.  Pneumococcal polysaccharide (PPSV23) vaccine. One dose is recommended after age 74. Talk to your health care provider about which screenings and vaccines you need and how often you need them. This information is not intended to replace advice given to you by your health care provider. Make sure you discuss any questions you have with your health care provider. Document Released: 02/10/2015 Document Revised: 10/04/2015 Document Reviewed: 11/15/2014 Elsevier Interactive Patient Education  2017 [place] Prevention in the Home Falls can cause injuries. They can happen to people of all ages. There are many things you can do to make your home safe and to help prevent falls. What can I do on the outside of my home?  Regularly fix the edges of walkways and driveways and fix any cracks.  Remove anything that might make you trip as you walk through a door, such as a raised step or threshold.  Trim any bushes or trees on the path to your home.  Use bright outdoor lighting.  Clear any walking paths of anything that might make someone trip, such as rocks or tools.  Regularly check to see if handrails are loose or broken. Make sure that both sides of any steps have handrails.  Any raised decks and porches should have guardrails on the  edges.  Have any leaves, snow, or ice cleared regularly.  Use sand or salt on walking paths during winter.  Clean up any spills in your garage right away. This includes oil or grease spills. What can I do in the bathroom?  Use night lights.  Install grab bars by the toilet and in the tub and shower. Do not use towel bars as grab bars.  Use non-skid mats or decals in the tub or shower.  If you need to sit down in the shower, use a plastic, non-slip stool.  Keep the floor dry. Clean up any water that spills on the floor as soon as it happens.  Remove soap buildup in the tub or shower regularly.  Attach bath mats securely with double-sided non-slip rug tape.  Do not have throw rugs and other things on the floor that can make you trip. What can I do in the bedroom?  Use night lights.  Make sure that you have a light by your bed that is easy to reach.  Do not use any sheets or blankets that are too big for your bed. They should not hang down onto  the floor.  Have a firm chair that has side arms. You can use this for support while you get dressed.  Do not have throw rugs and other things on the floor that can make you trip. What can I do in the kitchen?  Clean up any spills right away.  Avoid walking on wet floors.  Keep items that you use a lot in easy-to-reach places.  If you need to reach something above you, use a strong step stool that has a grab bar.  Keep electrical cords out of the way.  Do not use floor polish or wax that makes floors slippery. If you must use wax, use non-skid floor wax.  Do not have throw rugs and other things on the floor that can make you trip. What can I do with my stairs?  Do not leave any items on the stairs.  Make sure that there are handrails on both sides of the stairs and use them. Fix handrails that are broken or loose. Make sure that handrails are as long as the stairways.  Check any carpeting to make sure that it is firmly  attached to the stairs. Fix any carpet that is loose or worn.  Avoid having throw rugs at the top or bottom of the stairs. If you do have throw rugs, attach them to the floor with carpet tape.  Make sure that you have a light switch at the top of the stairs and the bottom of the stairs. If you do not have them, ask someone to add them for you. What else can I do to help prevent falls?  Wear shoes that:  Do not have high heels.  Have rubber bottoms.  Are comfortable and fit you well.  Are closed at the toe. Do not wear sandals.  If you use a stepladder:  Make sure that it is fully opened. Do not climb a closed stepladder.  Make sure that both sides of the stepladder are locked into place.  Ask someone to hold it for you, if possible.  Clearly mark and make sure that you can see:  Any grab bars or handrails.  First and last steps.  Where the edge of each step is.  Use tools that help you move around (mobility aids) if they are needed. These include:  Canes.  Walkers.  Scooters.  Crutches.  Turn on the lights when you go into a dark area. Replace any light bulbs as soon as they burn out.  Set up your furniture so you have a clear path. Avoid moving your furniture around.  If any of your floors are uneven, fix them.  If there are any pets around you, be aware of where they are.  Review your medicines with your doctor. Some medicines can make you feel dizzy. This can increase your chance of falling. Ask your doctor what other things that you can do to help prevent falls. This information is not intended to replace advice given to you by your health care provider. Make sure you discuss any questions you have with your health care provider. Document Released: 11/10/2008 Document Revised: 06/22/2015 Document Reviewed: 02/18/2014 Elsevier Interactive Patient Education  2017 [employer].

## 2019-11-04 NOTE — Progress Notes (Signed)
Subjective:   Sally Douglas is a 74 y.o. female who presents for Medicare Annual (Subsequent) preventive examination.  Review of Systems    No ROS.  Medicare Wellness Virtual Visit.  Cardiac Risk Factors include: advanced age (>99men, >64 women)     Objective:    Today's Vitals   11/04/19 0936  Weight: 166 lb (75.3 kg)  Height: 4\' 8"  (1.422 m)   Body mass index is 37.22 kg/m.  Advanced Directives 11/04/2019 10/30/2018 05/02/2015 05/02/2015 10/16/2014 10/27/2012 01/24/2011  Does Patient Have a Medical Advance Directive? No No No No No Patient does not have advance directive;Patient would like information Patient does not have advance directive  Would patient like information on creating a medical advance directive? No - Patient declined No - Patient declined - - - - -    Current Medications (verified) Outpatient Encounter Medications as of 11/04/2019  Medication Sig  . amLODipine (NORVASC) 10 MG tablet Take 1 tablet (10 mg total) by mouth daily.  . bifidobacterium infantis (ALIGN) capsule Take 1 capsule by mouth daily.  . Cholecalciferol (VITAMIN D) 125 MCG (5000 UT) CAPS Take by mouth daily.  [name] HYDROcodone-acetaminophen (NORCO) 10-325 MG tablet Take 1 tablet by mouth every 6 (six) hours as needed.  [name] losartan-hydrochlorothiazide (HYZAAR) 100-25 MG tablet Take 0.5 tablets by mouth daily. In am  . omeprazole (PRILOSEC) 40 MG capsule Take 1 capsule (40 mg total) by mouth daily.  . polyethylene glycol (MIRALAX / GLYCOLAX) packet Take 17 g by mouth daily as needed for mild constipation.  . VOLTAREN 1 % GEL Apply 1 application topically daily as needed. FOR HANDS   No facility-administered encounter medications on file as of 11/04/2019.    Allergies (verified) K-dur [potassium chloride] and Nsaids   History: Past Medical History:  Diagnosis Date  . Abnormal finding on Pap smear    hx abnl pap  . Arthritis   . Bronchitis    hx of-many yrs ago  . Constipation    takes Miralax  daily  . H/O hernia repair    with mesh   . Hypertension    takes Diltiazem and HCTZ daily  . Insomnia    takes Ambien nightly  . Joint pain   . Joint swelling   . Low back pain   . Menopausal syndrome   . Obesity   . Perforated gastric ulcer ([place])   . Tobacco abuse    Past Surgical History:  Procedure Laterality Date  . CHOLECYSTECTOMY N/A 05/05/2015   Procedure: LAPAROSCOPIC CHOLECYSTECTOMY;  Surgeon: Rolm Bookbinder, MD;  Location: [facility];  Service: General;  Laterality: N/A;  . DILATION AND CURETTAGE OF UTERUS    . LAPAROTOMY N/A 10/16/2014   Procedure: EXPLORATORY LAPAROTOMY, REPAIR OF GASTRIC ULCER;  Surgeon: Georganna Skeans, MD;  Location: [facility];  Service: General;  Laterality: N/A;  . MAXIMUM ACCESS (MAS)POSTERIOR LUMBAR INTERBODY FUSION (PLIF) 1 LEVEL N/A 11/04/2012   Procedure: Lumbar four-five Maximum access posterior lumbar interbody fusion with Harlin Heys;  Surgeon: Eustace Moore, MD;  Location: [facility];  Service: Neurosurgery;  Laterality: N/A;  Lumbar four-five Maximum access posterior lumbar interbody fusion with Nuvasive  . TONSILLECTOMY    . VENTRAL HERNIA REPAIR  01/24/2011   Procedure: LAPAROSCOPIC VENTRAL HERNIA;  Surgeon: Adin Hector, MD;  Location: [facility];  Service: General;  Laterality: N/A;  with Mesh   Family History  Problem Relation Age of Onset  . Cancer Brother        renal ,  also sister, skin cancer   . COPD Brother   . Hypertension Brother   . Diabetes Brother   . Kidney disease Brother   . Cancer Mother        Breast Cancer  . Diabetes Mother   . Hypertension Mother   . Glaucoma Mother   . Diabetes Sister   . Hypertension Sister   . Heart disease Sister   . Pancreatic cancer Sister   . Diabetes Brother        and sister  . Breast cancer Other        sister  . Kidney disease Other    Social History   Socioeconomic History  . Marital status: Married    Spouse name: Not on file  . Number of children: Not on file  . Years of  education: Not on file  . Highest education level: Not on file  Occupational History  . Occupation: retired  Tobacco Use  . Smoking status: Former Smoker    Packs/day: 0.25    Years: 25.00    Pack years: 6.25    Types: Cigarettes    Quit date: 10/16/2014    Years since quitting: 5.0  . Smokeless tobacco: Never Used  . Tobacco comment: Used Electronic Cigarettes until quit  Substance and Sexual Activity  . Alcohol use: No    Alcohol/week: 0.0 standard drinks  . Drug use: No  . Sexual activity: Not Currently    Birth control/protection: Post-menopausal  Other Topics Concern  . Not on file  Social History Narrative   gavidia 1   Para 1    aborta 0   1 daughter       Social Determinants of Health   Financial Resource Strain: Low Risk   . Difficulty of Paying Living Expenses: Not hard at all  Food Insecurity: No Food Insecurity  . Worried About Charity fundraiser in the Last Year: Never true  . Ran Out of Food in the Last Year: Never true  Transportation Needs: No Transportation Needs  . Lack of Transportation (Medical): No  . Lack of Transportation (Non-Medical): No  Physical Activity:   . Days of Exercise per Week: Not on file  . Minutes of Exercise per Session: Not on file  Stress: No Stress Concern Present  . Feeling of Stress : Only a little  Social Connections: Unknown  . Frequency of Communication with Friends and Family: Not on file  . Frequency of Social Gatherings with Friends and Family: Not on file  . Attends Religious Services: Not on file  . Active Member of Clubs or Organizations: Not on file  . Attends Archivist Meetings: Not on file  . Marital Status: Married    Tobacco Counseling Counseling given: Not Answered Comment: Used Electronic Cigarettes until quit   Clinical Intake:  Pre-visit preparation completed: Yes        Diabetes: No  How often do you need to have someone help you when you read instructions, pamphlets, or other  written materials from your doctor or pharmacy?: 1 - Never Interpreter Needed?: No      Activities of Daily Living In your present state of health, do you have any difficulty performing the following activities: 11/04/2019  Hearing? N  Vision? N  Difficulty concentrating or making decisions? N  Walking or climbing stairs? Y  Comment Chronic back pain  Dressing or bathing? N  Doing errands, shopping? N  Preparing Food and eating ? N  Using the  Toilet? N  In the past six months, have you accidently leaked urine? N  Do you have problems with loss of bowel control? N  Managing your Medications? N  Managing your Finances? N  Housekeeping or managing your Housekeeping? N  Some recent data might be hidden    Patient Care Team: McLean-Scocuzza, Nino Glow, MD as PCP - General (Internal Medicine)  Indicate any recent Medical Services you may have received from other than [facility] providers in the past year (date may be approximate).     Assessment:   This is a routine wellness examination for [place].  I connected with [place] today by telephone and verified that I am speaking with the correct person using two identifiers. Location patient: home Location provider: work Persons participating in the virtual visit: patient, Marine scientist.    I discussed the limitations, risks, security and privacy concerns of performing an evaluation and management service by telephone and the availability of in person appointments. The patient expressed understanding and verbally consented to this telephonic visit.    Interactive audio and video telecommunications were attempted between this provider and patient, however failed, due to patient having technical difficulties OR patient did not have access to video capability.  We continued and completed visit with audio only.  Some vital signs may be absent or patient reported.   Hearing/Vision screen  Hearing Screening   125Hz  250Hz  500Hz  1000Hz  2000Hz  3000Hz  4000Hz   6000Hz  8000Hz   Right ear:           Left ear:           Comments: Patient is able to hear conversational tones without difficulty.  No issues reported.  Vision Screening Comments: Wears corrective lenses  Cataract extraction, bilateral  Visual acuity not assessed, virtual visit. They have seen their ophthalmologist in the last 12 months.   Dietary issues and exercise activities discussed: Current Exercise Habits: The patient does not participate in regular exercise at present  Goals    . Follow up with Primary Care Provider     As needed      Depression Screen PHQ 2/9 Scores 11/04/2019 05/05/2019 11/03/2018 10/30/2018 03/16/2015 11/29/2013 10/29/2012  PHQ - 2 Score 0 0 0 0 0 0 0    Fall Risk Fall Risk  11/04/2019 05/05/2019 11/03/2018 10/30/2018 03/16/2015  Falls in the past year? 0 0 0 0 No  Number falls in past yr: - 0 - - -  Injury with Fall? - 0 - - -  Risk for fall due to : - - - - -  Follow up Falls evaluation completed Falls evaluation completed - - -   Handrails in use when climbing stairs? Yes Home free of loose throw rugs in walkways, pet beds, electrical cords, etc? Yes  Adequate lighting in your home to reduce risk of falls? Yes   ASSISTIVE DEVICES UTILIZED TO PREVENT FALLS: Use of a cane, walker or w/c? No    TIMED UP AND GO: Was the test performed? No . Virtual visit.   Cognitive Function:     6CIT Screen 10/30/2018  What Year? 0 points  What month? 0 points  What time? 0 points  Count back from 20 0 points  Months in reverse 0 points  Repeat phrase 0 points  Total Score 0    Immunizations Immunization History  Administered Date(s) Administered  . Fluad Quad(high Dose 65+) 11/03/2018  . Influenza, High Dose Seasonal PF 11/15/2015, 11/04/2016, 11/22/2017  . Influenza,inj,Quad PF,6+ Mos 11/05/2012, 11/29/2013, 10/18/2014  .  PFIZER SARS-COV-2 Vaccination 03/26/2019, 04/21/2019  . Pneumococcal Conjugate-13 11/29/2013  . Pneumococcal Polysaccharide-23  05/16/2011  . Td 07/15/2008   Health Maintenance Health Maintenance  Topic Date Due  . TETANUS/TDAP  07/16/2018  . INFLUENZA VACCINE  04/27/2020 (Originally 08/29/2019)  . MAMMOGRAM  11/03/2020 (Originally 04/29/1995)  . DEXA SCAN  11/03/2020 (Originally 04/29/2010)  . COLONOSCOPY  11/03/2020 (Originally 04/29/1995)  . COVID-19 Vaccine  Completed  . Hepatitis C Screening  Completed  . PNA vac Low Risk Adult  Completed   Colonoscopy, Dexa Scan, Mammogram declined until later in the season.   Dental Screening: Recommended annual dental exams for proper oral hygiene  Community Resource Referral / Chronic Care Management: CRR required this visit?  No   CCM required this visit?  No      Plan:   Keep all routine maintenance appointments.   Follow up 11/10/19 @ 11:00  I have personally reviewed and noted the following in the patient's chart:   . Medical and social history . Use of alcohol, tobacco or illicit drugs  . Current medications and supplements . Functional ability and status . Nutritional status . Physical activity . Advanced directives . List of other physicians . Hospitalizations, surgeries, and ER visits in previous 12 months . Vitals . Screenings to include cognitive, depression, and falls . Referrals and appointments  In addition, I have reviewed and discussed with patient certain preventive protocols, quality metrics, and best practice recommendations. A written personalized care plan for preventive services as well as general preventive health recommendations were provided to patient via mychart.     Varney Biles, LPN   [id number]

## 2019-11-10 ENCOUNTER — Other Ambulatory Visit: Payer: Self-pay

## 2019-11-10 ENCOUNTER — Encounter: Payer: Self-pay | Admitting: Internal Medicine

## 2019-11-10 ENCOUNTER — Ambulatory Visit (INDEPENDENT_AMBULATORY_CARE_PROVIDER_SITE_OTHER): Payer: Medicare Other | Admitting: Internal Medicine

## 2019-11-10 VITALS — BP 140/88 | HR 74 | Temp 97.7°F | Ht <= 58 in | Wt 168.4 lb

## 2019-11-10 DIAGNOSIS — Z23 Encounter for immunization: Secondary | ICD-10-CM

## 2019-11-10 DIAGNOSIS — E559 Vitamin D deficiency, unspecified: Secondary | ICD-10-CM | POA: Diagnosis not present

## 2019-11-10 DIAGNOSIS — E669 Obesity, unspecified: Secondary | ICD-10-CM

## 2019-11-10 DIAGNOSIS — T63444A Toxic effect of venom of bees, undetermined, initial encounter: Secondary | ICD-10-CM

## 2019-11-10 DIAGNOSIS — I10 Essential (primary) hypertension: Secondary | ICD-10-CM

## 2019-11-10 DIAGNOSIS — R7303 Prediabetes: Secondary | ICD-10-CM

## 2019-11-10 DIAGNOSIS — K259 Gastric ulcer, unspecified as acute or chronic, without hemorrhage or perforation: Secondary | ICD-10-CM | POA: Diagnosis not present

## 2019-11-10 DIAGNOSIS — T148XXA Other injury of unspecified body region, initial encounter: Secondary | ICD-10-CM

## 2019-11-10 LAB — COMPREHENSIVE METABOLIC PANEL
ALT: 11 U/L (ref 0–35)
AST: 11 U/L (ref 0–37)
Albumin: 4.4 g/dL (ref 3.5–5.2)
Alkaline Phosphatase: 88 U/L (ref 39–117)
BUN: 18 mg/dL (ref 6–23)
CO2: 31 mEq/L (ref 19–32)
Calcium: 9.9 mg/dL (ref 8.4–10.5)
Chloride: 99 mEq/L (ref 96–112)
Creatinine, Ser: 1.06 mg/dL (ref 0.40–1.20)
GFR: 51.49 mL/min — ABNORMAL LOW (ref >60.00)
Glucose, Bld: 100 mg/dL — ABNORMAL HIGH (ref 70–99)
Potassium: 4.6 mEq/L (ref 3.5–5.1)
Sodium: 139 mEq/L (ref 135–145)
Total Bilirubin: 0.4 mg/dL (ref 0.2–1.2)
Total Protein: 7.2 g/dL (ref 6.0–8.3)

## 2019-11-10 LAB — LIPID PANEL
Cholesterol: 163 mg/dL (ref 0–200)
HDL: 62 mg/dL (ref >39.00)
LDL Cholesterol: 64 mg/dL (ref 0–99)
NonHDL: 100.62
Total CHOL/HDL Ratio: 3
Triglycerides: 185 mg/dL — ABNORMAL HIGH (ref 0.0–149.0)
VLDL: 37 mg/dL (ref 0.0–40.0)

## 2019-11-10 LAB — CBC WITH DIFFERENTIAL/PLATELET
Basophils Absolute: 0.1 10*3/uL (ref 0.0–0.1)
Basophils Relative: 0.7 % (ref 0.0–3.0)
Eosinophils Absolute: 0.3 10*3/uL (ref 0.0–0.7)
Eosinophils Relative: 2.4 % (ref 0.0–5.0)
HCT: 41.9 % (ref 36.0–46.0)
Hemoglobin: 13.9 g/dL (ref 12.0–15.0)
Lymphocytes Relative: 29.9 % (ref 12.0–46.0)
Lymphs Abs: 3.3 10*3/uL (ref 0.7–4.0)
MCHC: 33.2 g/dL (ref 30.0–36.0)
MCV: 88.8 fl (ref 78.0–100.0)
Monocytes Absolute: 0.8 10*3/uL (ref 0.1–1.0)
Monocytes Relative: 7.4 % (ref 3.0–12.0)
Neutro Abs: 6.6 10*3/uL (ref 1.4–7.7)
Neutrophils Relative %: 59.6 % (ref 43.0–77.0)
Platelets: 252 10*3/uL (ref 150.0–400.0)
RBC: 4.72 Mil/uL (ref 3.87–5.11)
RDW: 14.2 % (ref 11.5–15.5)
WBC: 11 10*3/uL — ABNORMAL HIGH (ref 4.0–10.5)

## 2019-11-10 LAB — VITAMIN D 25 HYDROXY (VIT D DEFICIENCY, FRACTURES): VITD: 77.79 ng/mL (ref 30.00–100.00)

## 2019-11-10 LAB — HEMOGLOBIN A1C: Hgb A1c MFr Bld: 6.5 % (ref 4.6–6.5)

## 2019-11-10 MED ORDER — OMEPRAZOLE 40 MG PO CPDR: 40.0000 mg | DELAYED_RELEASE_CAPSULE | Freq: Every day | ORAL | 3 refills | Status: DC

## 2019-11-10 MED ORDER — LOSARTAN POTASSIUM-HCTZ 100-25 MG PO TABS: 0.5000 | ORAL_TABLET | Freq: Every day | ORAL | 3 refills | Status: DC

## 2019-11-10 MED ORDER — AMLODIPINE BESYLATE 10 MG PO TABS: 10.0000 mg | ORAL_TABLET | Freq: Every day | ORAL | 3 refills | Status: DC

## 2019-11-10 MED ORDER — MUPIROCIN 2 % EX OINT: 1.0000 "application " | TOPICAL_OINTMENT | Freq: Three times a day (TID) | CUTANEOUS | 0 refills | Status: DC

## 2019-11-10 MED ORDER — CLOBETASOL PROPIONATE 0.05 % EX CREA: 1.0000 "application " | TOPICAL_CREAM | Freq: Two times a day (BID) | CUTANEOUS | 0 refills | Status: DC

## 2019-11-10 NOTE — Patient Instructions (Signed)
DASH Eating Plan DASH stands for "Dietary Approaches to Stop Hypertension." The DASH eating plan is a healthy eating plan that has been shown to reduce high blood pressure (hypertension). It may also reduce your risk for type 2 diabetes, heart disease, and stroke. The DASH eating plan may also help with weight loss. What are tips for following this plan?  General guidelines  Avoid eating more than 2,300 mg (milligrams) of salt (sodium) a day. If you have hypertension, you may need to reduce your sodium intake to 1,500 mg a day.  Limit alcohol intake to no more than 1 drink a day for nonpregnant women and 2 drinks a day for men. One drink equals 12 oz of beer, 5 oz of wine, or 1 oz of hard liquor.  Work with your health care provider to maintain a healthy body weight or to lose weight. Ask what an ideal weight is for you.  Get at least 30 minutes of exercise that causes your heart to beat faster (aerobic exercise) most days of the week. Activities may include walking, swimming, or biking.  Work with your health care provider or diet and nutrition specialist (dietitian) to adjust your eating plan to your individual calorie needs. Reading food labels   Check food labels for the amount of sodium per serving. Choose foods with less than 5 percent of the Daily Value of sodium. Generally, foods with less than 300 mg of sodium per serving fit into this eating plan.  To find whole grains, look for the word "whole" as the first word in the ingredient list. Shopping  Buy products labeled as "low-sodium" or "no salt added."  Buy fresh foods. Avoid canned foods and premade or frozen meals. Cooking  Avoid adding salt when cooking. Use salt-free seasonings or herbs instead of table salt or sea salt. Check with your health care provider or pharmacist before using salt substitutes.  Do not fry foods. Cook foods using healthy methods such as baking, boiling, grilling, and broiling instead.  Cook  with heart-healthy oils, such as olive, canola, soybean, or sunflower oil. Meal planning  Eat a balanced diet that includes: ? 5 or more servings of fruits and vegetables each day. At each meal, try to fill half of your plate with fruits and vegetables. ? Up to 6-8 servings of whole grains each day. ? Less than 6 oz of lean meat, poultry, or fish each day. A 3-oz serving of meat is about the same size as a deck of cards. One egg equals 1 oz. ? 2 servings of low-fat dairy each day. ? A serving of nuts, seeds, or beans 5 times each week. ? Heart-healthy fats. Healthy fats called Omega-3 fatty acids are found in foods such as flaxseeds and coldwater fish, like sardines, salmon, and mackerel.  Limit how much you eat of the following: ? Canned or prepackaged foods. ? Food that is high in trans fat, such as fried foods. ? Food that is high in saturated fat, such as fatty meat. ? Sweets, desserts, sugary drinks, and other foods with added sugar. ? Full-fat dairy products.  Do not salt foods before eating.  Try to eat at least 2 vegetarian meals each week.  Eat more home-cooked food and less restaurant, buffet, and fast food.  When eating at a restaurant, ask that your food be prepared with less salt or no salt, if possible. What foods are recommended? The items listed may not be a complete list. Talk with your  dietitian about what dietary choices are best for you. Grains Whole-grain or whole-wheat bread. Whole-grain or whole-wheat pasta. Brown rice. [place]. Bulgur. Whole-grain and low-sodium cereals. Pita bread. Low-fat, low-sodium crackers. Whole-wheat flour tortillas. Vegetables Fresh or frozen vegetables (raw, steamed, roasted, or grilled). Low-sodium or reduced-sodium tomato and vegetable juice. Low-sodium or reduced-sodium tomato sauce and tomato paste. Low-sodium or reduced-sodium canned vegetables. Fruits All fresh, dried, or frozen fruit. Canned fruit in natural juice  (without added sugar). Meat and other protein foods Skinless chicken or [place]. Ground chicken or [place]. Pork with fat trimmed off. Fish and seafood. Egg whites. Dried beans, peas, or lentils. Unsalted nuts, nut butters, and seeds. Unsalted canned beans. Lean cuts of beef with fat trimmed off. Low-sodium, lean deli meat. Dairy Low-fat (1%) or fat-free (skim) milk. Fat-free, low-fat, or reduced-fat cheeses. Nonfat, low-sodium ricotta or cottage cheese. Low-fat or nonfat yogurt. Low-fat, low-sodium cheese. Fats and oils Soft margarine without trans fats. Vegetable oil. Low-fat, reduced-fat, or light mayonnaise and salad dressings (reduced-sodium). Canola, safflower, olive, soybean, and sunflower oils. Avocado. Seasoning and other foods Herbs. Spices. Seasoning mixes without salt. Unsalted popcorn and pretzels. Fat-free sweets. What foods are not recommended? The items listed may not be a complete list. Talk with your dietitian about what dietary choices are best for you. Grains Baked goods made with fat, such as croissants, muffins, or some breads. Dry pasta or rice meal packs. Vegetables Creamed or fried vegetables. Vegetables in a cheese sauce. Regular canned vegetables (not low-sodium or reduced-sodium). Regular canned tomato sauce and paste (not low-sodium or reduced-sodium). Regular tomato and vegetable juice (not low-sodium or reduced-sodium). Angie Fava. Olives. Fruits Canned fruit in a light or heavy syrup. Fried fruit. Fruit in cream or butter sauce. Meat and other protein foods Fatty cuts of meat. Ribs. Fried meat. Berniece Salines. Sausage. Bologna and other processed lunch meats. Salami. Fatback. Hotdogs. Bratwurst. Salted nuts and seeds. Canned beans with added salt. Canned or smoked fish. Whole eggs or egg yolks. Chicken or [place] with skin. Dairy Whole or 2% milk, cream, and half-and-half. Whole or full-fat cream cheese. Whole-fat or sweetened yogurt. Full-fat cheese. Nondairy creamers. Whipped  toppings. Processed cheese and cheese spreads. Fats and oils Butter. Stick margarine. Lard. Shortening. Ghee. Bacon fat. Tropical oils, such as coconut, palm kernel, or palm oil. Seasoning and other foods Salted popcorn and pretzels. Onion salt, garlic salt, seasoned salt, table salt, and sea salt. Worcestershire sauce. Tartar sauce. Barbecue sauce. Teriyaki sauce. Soy sauce, including reduced-sodium. Steak sauce. Canned and packaged gravies. Fish sauce. Oyster sauce. Cocktail sauce. Horseradish that you find on the shelf. Ketchup. Mustard. Meat flavorings and tenderizers. Bouillon cubes. Hot sauce and Tabasco sauce. Premade or packaged marinades. Premade or packaged taco seasonings. Relishes. Regular salad dressings. Where to find more information:  National Heart, Lung, and [place]: https://wilson-eaton.com/  American Heart Association: www.heart.org Summary  The DASH eating plan is a healthy eating plan that has been shown to reduce high blood pressure (hypertension). It may also reduce your risk for type 2 diabetes, heart disease, and stroke.  With the DASH eating plan, you should limit salt (sodium) intake to 2,300 mg a day. If you have hypertension, you may need to reduce your sodium intake to 1,500 mg a day.  When on the DASH eating plan, aim to eat more fresh fruits and vegetables, whole grains, lean proteins, low-fat dairy, and heart-healthy fats.  Work with your health care provider or diet and nutrition specialist (dietitian) to adjust your eating plan  to your individual calorie needs. This information is not intended to replace advice given to you by your health care provider. Make sure you discuss any questions you have with your health care provider. Document Revised: 12/27/2016 Document Reviewed: 01/08/2016 Elsevier Patient Education  [place].  Tdap (Tetanus, Diphtheria, Pertussis) Vaccine: What You Need to Know 1. Why get vaccinated? Tdap vaccine can prevent  tetanus, diphtheria, and pertussis. Diphtheria and pertussis spread from person to person. Tetanus enters the body through cuts or wounds.  TETANUS (T) causes painful stiffening of the muscles. Tetanus can lead to serious health problems, including being unable to open the mouth, having trouble swallowing and breathing, or death.  DIPHTHERIA (D) can lead to difficulty breathing, heart failure, paralysis, or death.  PERTUSSIS (aP), also known as "whooping cough," can cause uncontrollable, violent coughing which makes it hard to breathe, eat, or drink. Pertussis can be extremely serious in babies and young children, causing pneumonia, convulsions, brain damage, or death. In teens and adults, it can cause weight loss, loss of bladder control, passing out, and rib fractures from severe coughing. 2. Tdap vaccine Tdap is only for children 7 years and older, adolescents, and adults.  Adolescents should receive a single dose of Tdap, preferably at age 57 or 45 years. Pregnant women should get a dose of Tdap during every pregnancy, to protect the newborn from pertussis. Infants are most at risk for severe, life-threatening complications from pertussis. Adults who have never received Tdap should get a dose of Tdap. Also, adults should receive a booster dose every 10 years, or earlier in the case of a severe and dirty wound or burn. Booster doses can be either Tdap or Td (a different vaccine that protects against tetanus and diphtheria but not pertussis). Tdap may be given at the same time as other vaccines. 3. Talk with your health care provider Tell your vaccine provider if the person getting the vaccine:  Has had an allergic reaction after a previous dose of any vaccine that protects against tetanus, diphtheria, or pertussis, or has any severe, life-threatening allergies.  Has had a coma, decreased level of consciousness, or prolonged seizures within 7 days after a previous dose of any pertussis vaccine  (DTP, DTaP, or Tdap).  Has seizures or another nervous system problem.  Has ever had Guillain-Barr Syndrome (also called GBS).  Has had severe pain or swelling after a previous dose of any vaccine that protects against tetanus or diphtheria. In some cases, your health care provider may decide to postpone Tdap vaccination to a future visit.  People with minor illnesses, such as a cold, may be vaccinated. People who are moderately or severely ill should usually wait until they recover before getting Tdap vaccine.  Your health care provider can give you more information. 4. Risks of a vaccine reaction  Pain, redness, or swelling where the shot was given, mild fever, headache, feeling tired, and nausea, vomiting, diarrhea, or stomachache sometimes happen after Tdap vaccine. People sometimes faint after medical procedures, including vaccination. Tell your provider if you feel dizzy or have vision changes or ringing in the ears.  As with any medicine, there is a very remote chance of a vaccine causing a severe allergic reaction, other serious injury, or death. 5. What if there is a serious problem? An allergic reaction could occur after the vaccinated person leaves the clinic. If you see signs of a severe allergic reaction (hives, swelling of the face and throat, difficulty breathing, a fast heartbeat,  dizziness, or weakness), call 9-1-1 and get the person to the nearest hospital. For other signs that concern you, call your health care provider.  Adverse reactions should be reported to the Vaccine Adverse Event Reporting System (VAERS). Your health care provider will usually file this report, or you can do it yourself. Visit the VAERS website at www.vaers.SamedayNews.es or call 973-225-9743. VAERS is only for reporting reactions, and VAERS staff do not give medical advice. 6. The National Vaccine Injury Compensation Program The [employer] Vaccine Injury Compensation Program (VICP) is a federal program that  was created to compensate people who may have been injured by certain vaccines. Visit the VICP website at GoldCloset.com.ee or call (903)792-3872 to learn about the program and about filing a claim. There is a time limit to file a claim for compensation. 7. How can I learn more?  Ask your health care provider.  Call your local or state health department.  Contact the Centers for Disease Control and Prevention (CDC): ? Call 719-158-6402 (1-800-CDC-INFO) or ? Visit CDC's website at http://hunter.com/ Vaccine Information Statement Tdap (Tetanus, Diphtheria, Pertussis) Vaccine (04/29/2018) This information is not intended to replace advice given to you by your health care provider. Make sure you discuss any questions you have with your health care provider. Document Revised: 05/08/2018 Document Reviewed: 05/11/2018 Elsevier Patient Education  [place].

## 2019-11-10 NOTE — Progress Notes (Signed)
Chief Complaint  Patient presents with  . Follow-up  . Hypertension    BP 1st check= 150/90. pt states she took her BP medications around 7 am this morning.   . Back Pain    on going back pain, rated 5/10   F/u  1. HTN uncontrolled repeat better on norvasc 10 mg qd and hyzaar 100-25 taking daily has stress husband with [facility] and declining and been going back and forth to [facility] appts  2. Right lower leg wound x 3-4 weeks healing but still open scissors cut her leg  She is not fasting for lab stoday 3. H/o 17 yellow jacket bites chest and arms b/l areas are itchy tried topical benadryl still itching  Review of Systems  Constitutional: Negative for weight loss.  HENT: Negative for hearing loss.   Eyes: Negative for blurred vision.  Respiratory: Negative for shortness of breath.   Cardiovascular: Negative for chest pain.  Musculoskeletal: Positive for back pain.       5/10 back pain   Skin: Negative for rash.  Psychiatric/Behavioral:       +stress    Past Medical History:  Diagnosis Date  . Abnormal finding on Pap smear    hx abnl pap  . Arthritis   . Bronchitis    hx of-many yrs ago  . Constipation    takes Miralax daily  . H/O hernia repair    with mesh   . Hypertension    takes Diltiazem and HCTZ daily  . Insomnia    takes Ambien nightly  . Joint pain   . Joint swelling   . Low back pain   . Menopausal syndrome   . Obesity   . Perforated gastric ulcer ([place])   . Tobacco abuse    Past Surgical History:  Procedure Laterality Date  . CHOLECYSTECTOMY N/A 05/05/2015   Procedure: LAPAROSCOPIC CHOLECYSTECTOMY;  Surgeon: Rolm Bookbinder, MD;  Location: [facility];  Service: General;  Laterality: N/A;  . DILATION AND CURETTAGE OF UTERUS    . LAPAROTOMY N/A 10/16/2014   Procedure: EXPLORATORY LAPAROTOMY, REPAIR OF GASTRIC ULCER;  Surgeon: Georganna Skeans, MD;  Location: [facility];  Service: General;  Laterality: N/A;  . MAXIMUM ACCESS (MAS)POSTERIOR LUMBAR INTERBODY FUSION (PLIF) 1  LEVEL N/A 11/04/2012   Procedure: Lumbar four-five Maximum access posterior lumbar interbody fusion with Harlin Heys;  Surgeon: Eustace Moore, MD;  Location: [facility];  Service: Neurosurgery;  Laterality: N/A;  Lumbar four-five Maximum access posterior lumbar interbody fusion with Nuvasive  . TONSILLECTOMY    . VENTRAL HERNIA REPAIR  01/24/2011   Procedure: LAPAROSCOPIC VENTRAL HERNIA;  Surgeon: Adin Hector, MD;  Location: [facility];  Service: General;  Laterality: N/A;  with Mesh   Family History  Problem Relation Age of Onset  . Cancer Brother        renal ,also sister, skin cancer   . COPD Brother   . Hypertension Brother   . Diabetes Brother   . Kidney disease Brother   . Cancer Mother        Breast Cancer  . Diabetes Mother   . Hypertension Mother   . Glaucoma Mother   . Diabetes Sister   . Hypertension Sister   . Heart disease Sister   . Pancreatic cancer Sister   . Diabetes Brother        and sister  . Breast cancer Other        sister  . Kidney disease Other    Social  History   Socioeconomic History  . Marital status: Married    Spouse name: Not on file  . Number of children: Not on file  . Years of education: Not on file  . Highest education level: Not on file  Occupational History  . Occupation: retired  Tobacco Use  . Smoking status: Former Smoker    Packs/day: 0.25    Years: 25.00    Pack years: 6.25    Types: Cigarettes    Quit date: 10/16/2014    Years since quitting: 5.0  . Smokeless tobacco: Never Used  . Tobacco comment: Used Electronic Cigarettes until quit  Substance and Sexual Activity  . Alcohol use: No    Alcohol/week: 0.0 standard drinks  . Drug use: No  . Sexual activity: Not Currently    Birth control/protection: Post-menopausal  Other Topics Concern  . Not on file  Social History Narrative   gavidia 1   Para 1    aborta 0   1 daughter       Social Determinants of Health   Financial Resource Strain: Low Risk   . Difficulty of  Paying Living Expenses: Not hard at all  Food Insecurity: No Food Insecurity  . Worried About Charity fundraiser in the Last Year: Never true  . Ran Out of Food in the Last Year: Never true  Transportation Needs: No Transportation Needs  . Lack of Transportation (Medical): No  . Lack of Transportation (Non-Medical): No  Physical Activity:   . Days of Exercise per Week: Not on file  . Minutes of Exercise per Session: Not on file  Stress: No Stress Concern Present  . Feeling of Stress : Only a little  Social Connections: Unknown  . Frequency of Communication with Friends and Family: Not on file  . Frequency of Social Gatherings with Friends and Family: Not on file  . Attends Religious Services: Not on file  . Active Member of Clubs or Organizations: Not on file  . Attends Archivist Meetings: Not on file  . Marital Status: Married  Human resources officer Violence: Not At Risk  . Fear of Current or Ex-Partner: No  . Emotionally Abused: No  . Physically Abused: No  . Sexually Abused: No   Current Meds  Medication Sig  . amLODipine (NORVASC) 10 MG tablet Take 1 tablet (10 mg total) by mouth daily.  . bifidobacterium infantis (ALIGN) capsule Take 1 capsule by mouth daily.  . Cholecalciferol (VITAMIN D) 125 MCG (5000 UT) CAPS Take by mouth daily.  [name] HYDROcodone-acetaminophen (NORCO) 10-325 MG tablet Take 1 tablet by mouth every 6 (six) hours as needed.  [name] losartan-hydrochlorothiazide (HYZAAR) 100-25 MG tablet Take 0.5 tablets by mouth daily. In am  . omeprazole (PRILOSEC) 40 MG capsule Take 1 capsule (40 mg total) by mouth daily.  . polyethylene glycol (MIRALAX / GLYCOLAX) packet Take 17 g by mouth daily as needed for mild constipation.  . VOLTAREN 1 % GEL Apply 1 application topically daily as needed. FOR HANDS  . [DISCONTINUED] amLODipine (NORVASC) 10 MG tablet Take 1 tablet (10 mg total) by mouth daily.  . [DISCONTINUED] losartan-hydrochlorothiazide (HYZAAR) 100-25 MG tablet Take  0.5 tablets by mouth daily. In am  . [DISCONTINUED] omeprazole (PRILOSEC) 40 MG capsule Take 1 capsule (40 mg total) by mouth daily.   Allergies  Allergen Reactions  . K-Dur [Potassium Chloride] Swelling    Cannot tolerate Potassium in any prepared form. Causes Face Swelling  . Nsaids Other (See Comments)  H/o PUD with perforation   No results found for this or any previous visit (from the past 2160 hour(s)). Objective  Body mass index is 37.75 kg/m. Wt Readings from Last 3 Encounters:  11/10/19 168 lb 6.4 oz (76.4 kg)  11/04/19 166 lb (75.3 kg)  05/05/19 166 lb 12.8 oz (75.7 kg)   Temp Readings from Last 3 Encounters:  11/10/19 97.7 F (36.5 C) (Oral)  05/05/19 98 F (36.7 C)  11/03/18 (!) 97.5 F (36.4 C) (Oral)   BP Readings from Last 3 Encounters:  11/10/19 140/88  05/05/19 138/86  11/03/18 (!) 166/86   Pulse Readings from Last 3 Encounters:  11/10/19 74  05/05/19 88  11/03/18 74    Physical Exam Vitals and nursing note reviewed.  Constitutional:      Appearance: Normal appearance. She is well-developed and well-groomed. She is obese.  HENT:     Head: Normocephalic and atraumatic.  Eyes:     Conjunctiva/sclera: Conjunctivae normal.     Pupils: Pupils are equal, round, and reactive to light.  Cardiovascular:     Rate and Rhythm: Normal rate and regular rhythm.     Heart sounds: Normal heart sounds. No murmur heard.   Pulmonary:     Effort: Pulmonary effort is normal.     Breath sounds: Normal breath sounds.  Skin:    General: Skin is warm and dry.  Neurological:     General: No focal deficit present.     Mental Status: She is alert and oriented to person, place, and time. Mental status is at baseline.     Gait: Gait normal.  Psychiatric:        Attention and Perception: Attention and perception normal.        Mood and Affect: Mood and affect normal.        Speech: Speech normal.        Behavior: Behavior normal. Behavior is cooperative.         Thought Content: Thought content normal.        Cognition and Memory: Cognition and memory normal.        Judgment: Judgment normal.     Assessment  Plan  Essential hypertension - Plan: amLODipine (NORVASC) 10 MG tablet, losartan-hydrochlorothiazide (HYZAAR) 100-25 MG tablet Consider BB declines for now  F/u in future   Vitamin D deficiency - Plan: Vitamin D (25 hydroxy)  Prediabetes - Plan: Hemoglobin A1c  Open wound - Plan: mupirocin ointment (BACTROBAN) 2 %, Tdap vaccine greater than or equal to 7yo IM  Bee sting, undetermined intent, initial encounter - Plan: clobetasol cream (TEMOVATE) 0.05 %, Tdap vaccine greater than or equal to 7yo IM  Obesity (BMI 30-39.9)HM rec healthy diet and exercise   HM Flu shot utdgiven today pna 23 utd   prevnar utd  Tdap given today Consider shingrix in future  covid 19 2/2 pfizer booster sch 12/11/19 grand USAA of age window pap  Colonoscopy and mammogram pt refuses disc'edprev  Declines cologuard  Declines DEXA  skin lesions never seen derm declines Hep C neg  Former smoker 1/2 ppd age max started age 58 y.o last cig in 2016 smoked 1/2 ppd x 15 years CT 2018 lung nodule but benign after serial f/u   Provider: Dr. Olivia Mackie McLean-Scocuzza-Internal Medicine

## 2019-11-29 DIAGNOSIS — Z5181 Encounter for therapeutic drug level monitoring: Secondary | ICD-10-CM | POA: Diagnosis not present

## 2019-11-29 DIAGNOSIS — M549 Dorsalgia, unspecified: Secondary | ICD-10-CM | POA: Diagnosis not present

## 2019-11-29 DIAGNOSIS — M48061 Spinal stenosis, lumbar region without neurogenic claudication: Secondary | ICD-10-CM | POA: Diagnosis not present

## 2019-12-14 ENCOUNTER — Other Ambulatory Visit: Payer: Self-pay | Admitting: Internal Medicine

## 2019-12-14 ENCOUNTER — Ambulatory Visit: Payer: Medicare Other | Attending: Internal Medicine

## 2019-12-14 DIAGNOSIS — Z23 Encounter for immunization: Secondary | ICD-10-CM

## 2019-12-14 NOTE — Progress Notes (Signed)
   Covid-19 Vaccination Clinic  Name:  Sally Douglas    MRN: [id number] DOB: Nov 25, 1945  12/14/2019  [name] was observed post Covid-19 immunization for 15 minutes without incident. She was provided with Vaccine Information Sheet and instruction to access the V-Safe system.   [name] was instructed to call 911 with any severe reactions post vaccine: [name] Difficulty breathing  . Swelling of face and throat  . A fast heartbeat  . A bad rash all over body  . Dizziness and weakness   Immunizations Administered    Name Date Dose VIS Date Route   Pfizer COVID-19 Vaccine 12/14/2019 12:54 PM 0.3 mL 11/17/2019 Intramuscular   Manufacturer: [place]   Lot: [id number]   [place]: [id number]      Covid-19 Vaccination Clinic  Name:  Sally Douglas    MRN: [id number] DOB: [birth year]  12/14/2019  [name] was observed post Covid-19 immunization for 15 minutes without incident. She was provided with Vaccine Information Sheet and instruction to access the V-Safe system.   [name] was instructed to call 911 with any severe reactions post vaccine: [name] Difficulty breathing  . Swelling of face and throat  . A fast heartbeat  . A bad rash all over body  . Dizziness and weakness   Immunizations Administered    Name Date Dose VIS Date Route   Pfizer COVID-19 Vaccine 12/14/2019 12:54 PM 0.3 mL 11/17/2019 Intramuscular   Manufacturer: [place]   Lot: [id number]   [place]: [id number]

## 2020-02-29 DIAGNOSIS — M48061 Spinal stenosis, lumbar region without neurogenic claudication: Secondary | ICD-10-CM | POA: Diagnosis not present

## 2020-03-08 ENCOUNTER — Encounter: Payer: Self-pay | Admitting: Internal Medicine

## 2020-03-08 DIAGNOSIS — H0288B Meibomian gland dysfunction left eye, upper and lower eyelids: Secondary | ICD-10-CM | POA: Diagnosis not present

## 2020-03-08 DIAGNOSIS — H35371 Puckering of macula, right eye: Secondary | ICD-10-CM | POA: Diagnosis not present

## 2020-03-08 DIAGNOSIS — Z961 Presence of intraocular lens: Secondary | ICD-10-CM | POA: Diagnosis not present

## 2020-03-08 DIAGNOSIS — H0288A Meibomian gland dysfunction right eye, upper and lower eyelids: Secondary | ICD-10-CM | POA: Diagnosis not present

## 2020-03-15 ENCOUNTER — Telehealth: Payer: Self-pay | Admitting: Internal Medicine

## 2020-03-15 NOTE — Telephone Encounter (Signed)
Patient called in stated that her bill was coded wrong for tdap shot

## 2020-03-18 ENCOUNTER — Other Ambulatory Visit: Payer: Self-pay | Admitting: Internal Medicine

## 2020-03-18 DIAGNOSIS — T63444A Toxic effect of venom of bees, undetermined, initial encounter: Secondary | ICD-10-CM

## 2020-03-21 NOTE — Telephone Encounter (Signed)
Have been informed that patient's over 65 can not have a TDAP in office under no circumstances.   Patient's coming in for injuries that require a vaccine it will have to be a Tetanus alone to be covered in office. Otherwise they will have to go to the pharmacy.   Patient has received a bill for TDAP given in office 10/2019. Currently being handled by Juliann Pulse and Lorriane Shire.

## 2020-03-21 NOTE — Telephone Encounter (Signed)
Noted insurance will only cover TD not TDAP for open wound  I rec TDAP for whooping cough protection in the future only do TD for medicare pts

## 2020-03-22 NOTE — Telephone Encounter (Signed)
You can send email to charge correction explaining what happened and ask for them to void.  Thanks, [employer]

## 2020-03-22 NOTE — Telephone Encounter (Signed)
Emailed charge correction to have the charge removed due to giving the wrong vaccine.

## 2020-03-22 NOTE — Telephone Encounter (Signed)
left message informing the Patient of this.

## 2020-03-22 NOTE — Telephone Encounter (Signed)
Yes I can. Thank you! °

## 2020-03-22 NOTE — Telephone Encounter (Signed)
Hello Sally Douglas , can this charge be removed . Due to the provider giving the wrong injection?

## 2020-04-12 ENCOUNTER — Other Ambulatory Visit: Payer: Self-pay | Admitting: Internal Medicine

## 2020-04-12 ENCOUNTER — Encounter: Payer: Self-pay | Admitting: Internal Medicine

## 2020-04-12 ENCOUNTER — Ambulatory Visit (INDEPENDENT_AMBULATORY_CARE_PROVIDER_SITE_OTHER): Payer: Medicare Other | Admitting: Internal Medicine

## 2020-04-12 ENCOUNTER — Other Ambulatory Visit: Payer: Self-pay

## 2020-04-12 ENCOUNTER — Telehealth: Payer: Self-pay | Admitting: Internal Medicine

## 2020-04-12 VITALS — BP 140/88 | HR 76 | Temp 97.9°F | Ht <= 58 in | Wt 151.8 lb

## 2020-04-12 DIAGNOSIS — I10 Essential (primary) hypertension: Secondary | ICD-10-CM

## 2020-04-12 DIAGNOSIS — N3000 Acute cystitis without hematuria: Secondary | ICD-10-CM

## 2020-04-12 DIAGNOSIS — D72829 Elevated white blood cell count, unspecified: Secondary | ICD-10-CM

## 2020-04-12 DIAGNOSIS — E1159 Type 2 diabetes mellitus with other circulatory complications: Secondary | ICD-10-CM

## 2020-04-12 DIAGNOSIS — I152 Hypertension secondary to endocrine disorders: Secondary | ICD-10-CM | POA: Diagnosis not present

## 2020-04-12 DIAGNOSIS — K259 Gastric ulcer, unspecified as acute or chronic, without hemorrhage or perforation: Secondary | ICD-10-CM

## 2020-04-12 HISTORY — DX: Type 2 diabetes mellitus with other circulatory complications: E11.59

## 2020-04-12 HISTORY — DX: Hypertension secondary to endocrine disorders: I15.2

## 2020-04-12 LAB — CBC WITH DIFFERENTIAL/PLATELET
Basophils Absolute: 0.1 10*3/uL (ref 0.0–0.1)
Basophils Relative: 0.5 % (ref 0.0–3.0)
Eosinophils Absolute: 0.2 10*3/uL (ref 0.0–0.7)
Eosinophils Relative: 1.5 % (ref 0.0–5.0)
HCT: 41.2 % (ref 36.0–46.0)
Hemoglobin: 13.4 g/dL (ref 12.0–15.0)
Lymphocytes Relative: 22.4 % (ref 12.0–46.0)
Lymphs Abs: 2.6 10*3/uL (ref 0.7–4.0)
MCHC: 32.6 g/dL (ref 30.0–36.0)
MCV: 88.9 fl (ref 78.0–100.0)
Monocytes Absolute: 0.8 10*3/uL (ref 0.1–1.0)
Monocytes Relative: 6.8 % (ref 3.0–12.0)
Neutro Abs: 8 10*3/uL — ABNORMAL HIGH (ref 1.4–7.7)
Neutrophils Relative %: 68.8 % (ref 43.0–77.0)
Platelets: 231 10*3/uL (ref 150.0–400.0)
RBC: 4.63 Mil/uL (ref 3.87–5.11)
RDW: 14.2 % (ref 11.5–15.5)
WBC: 11.6 10*3/uL — ABNORMAL HIGH (ref 4.0–10.5)

## 2020-04-12 LAB — LIPID PANEL
Cholesterol: 128 mg/dL (ref 0–200)
HDL: 64.3 mg/dL (ref >39.00)
LDL Cholesterol: 49 mg/dL (ref 0–99)
NonHDL: 63.9
Total CHOL/HDL Ratio: 2
Triglycerides: 75 mg/dL (ref 0.0–149.0)
VLDL: 15 mg/dL (ref 0.0–40.0)

## 2020-04-12 LAB — COMPREHENSIVE METABOLIC PANEL
ALT: 15 U/L (ref 0–35)
AST: 14 U/L (ref 0–37)
Albumin: 4.2 g/dL (ref 3.5–5.2)
Alkaline Phosphatase: 77 U/L (ref 39–117)
BUN: 16 mg/dL (ref 6–23)
CO2: 30 mEq/L (ref 19–32)
Calcium: 9.6 mg/dL (ref 8.4–10.5)
Chloride: 101 mEq/L (ref 96–112)
Creatinine, Ser: 1.05 mg/dL (ref 0.40–1.20)
GFR: 52.18 mL/min — ABNORMAL LOW (ref >60.00)
Glucose, Bld: 110 mg/dL — ABNORMAL HIGH (ref 70–99)
Potassium: 3.7 mEq/L (ref 3.5–5.1)
Sodium: 140 mEq/L (ref 135–145)
Total Bilirubin: 1 mg/dL (ref 0.2–1.2)
Total Protein: 6.8 g/dL (ref 6.0–8.3)

## 2020-04-12 LAB — HEMOGLOBIN A1C: Hgb A1c MFr Bld: 6.1 % (ref 4.6–6.5)

## 2020-04-12 MED ORDER — OMEPRAZOLE 40 MG PO CPDR: 40.0000 mg | DELAYED_RELEASE_CAPSULE | Freq: Every day | ORAL | 3 refills | Status: DC

## 2020-04-12 MED ORDER — AMLODIPINE BESYLATE 10 MG PO TABS: 10.0000 mg | ORAL_TABLET | Freq: Every day | ORAL | 3 refills | Status: DC

## 2020-04-12 MED ORDER — LOSARTAN POTASSIUM-HCTZ 100-25 MG PO TABS: 0.5000 | ORAL_TABLET | Freq: Every day | ORAL | 3 refills | Status: DC

## 2020-04-12 MED ORDER — POLYETHYLENE GLYCOL 3350 17 G PO PACK: 17.0000 g | PACK | Freq: Every day | ORAL | 11 refills | Status: DC | PRN

## 2020-04-12 NOTE — Telephone Encounter (Signed)
Call and make sure pharmacy has hyzaar for BP pt was having issue with pharmacy  [facility]

## 2020-04-12 NOTE — Patient Instructions (Addendum)
Dr. Sharlene Motts or [facility] Jacklynn Ganong Shelba Flake  If therapy needed hospice or below [facility] counseling and psychiatry [facility]  [place] [phone]   [place] counseling and psychiatry [place]  [address] [place] [address]  [phone]

## 2020-04-12 NOTE — Progress Notes (Addendum)
Chief Complaint  Patient presents with   Follow-up   F/u  1. htn stable on norvasc 10 mg qd, hyzaar 100-25 mg qd With DM 2 not on meds last A1c 6.5 eye exam had 03/05/20  2. Weight loss she is not trying husband is 6" down to 106 lbs on palliative care and she is moving he is not well due to PBS  Review of Systems  Constitutional: Positive for weight loss.  HENT: Negative for hearing loss.   Eyes: Negative for blurred vision.  Respiratory: Negative for shortness of breath.   Cardiovascular: Negative for chest pain.  Gastrointestinal: Negative for abdominal pain.  Musculoskeletal: Negative for falls and joint pain.  Skin: Negative for rash.  Neurological: Negative for headaches.  Psychiatric/Behavioral: Negative for depression.   Past Medical History:  Diagnosis Date   Abnormal finding on Pap smear    hx abnl pap   Arthritis    Bronchitis    hx of-many yrs ago   Constipation    takes Miralax daily   H/O hernia repair    with mesh    Hypertension    takes Diltiazem and HCTZ daily   Hypertension associated with diabetes ([place]) 04/12/2020   Insomnia    takes Ambien nightly   Joint pain    Joint swelling    Low back pain    Menopausal syndrome    Obesity    Perforated gastric ulcer ([place])    Tobacco abuse    Past Surgical History:  Procedure Laterality Date   CHOLECYSTECTOMY N/A 05/05/2015   Procedure: LAPAROSCOPIC CHOLECYSTECTOMY;  Surgeon: Rolm Bookbinder, MD;  Location: [place];  Service: General;  Laterality: N/A;   DILATION AND CURETTAGE OF UTERUS     LAPAROTOMY N/A 10/16/2014   Procedure: EXPLORATORY LAPAROTOMY, REPAIR OF GASTRIC ULCER;  Surgeon: Georganna Skeans, MD;  Location: [place];  Service: General;  Laterality: N/A;   MAXIMUM ACCESS (MAS)POSTERIOR LUMBAR INTERBODY FUSION (PLIF) 1 LEVEL N/A 11/04/2012   Procedure: Lumbar four-five Maximum access posterior lumbar interbody fusion with Harlin Heys;  Surgeon: Eustace Moore, MD;  [facility];   Service: Neurosurgery;  Laterality: N/A;  Lumbar four-five Maximum access posterior lumbar interbody fusion with Nuvasive   TONSILLECTOMY     VENTRAL HERNIA REPAIR  01/24/2011   Procedure: LAPAROSCOPIC VENTRAL HERNIA;  Surgeon: Adin Hector, MD;  Location: [facility];  Service: General;  Laterality: N/A;  with Mesh   Family History  Problem Relation Age of Onset   Cancer Brother        renal ,also sister, skin cancer    COPD Brother    Hypertension Brother    Diabetes Brother    Kidney disease Brother    Cancer Mother        Breast Cancer   Diabetes Mother    Hypertension Mother    Glaucoma Mother    Diabetes Sister    Hypertension Sister    Heart disease Sister    Pancreatic cancer Sister    Diabetes Brother        and sister   Breast cancer Other        sister   Kidney disease Other    Social History   Socioeconomic History   Marital status: Married    Spouse name: Not on file   Number of children: Not on file   Years of education: Not on file   Highest education level: Not on file  Occupational History   Occupation: retired  Tobacco Use   Smoking status: Former Smoker    Packs/day: 0.25    Years: 25.00    Pack years: 6.25    Types: Cigarettes    Quit date: 10/16/2014    Years since quitting: 5.4   Smokeless tobacco: Never Used   Tobacco comment: Used Electronic Cigarettes until quit  Substance and Sexual Activity   Alcohol use: No    Alcohol/week: 0.0 standard drinks   Drug use: No   Sexual activity: Not Currently    Birth control/protection: Post-menopausal  Other Topics Concern   Not on file  Social History Narrative   gavidia 1   Para 1    aborta 0   1 daughter       Social Determinants of Radio broadcast assistant Strain: Low Risk    Difficulty of Paying Living Expenses: Not hard at all  Food Insecurity: No Food Insecurity   Worried About Charity fundraiser in the Last Year: Never true   Ran Out of Food in  the Last Year: Never true  Transportation Needs: No Transportation Needs   Lack of Transportation (Medical): No   Lack of Transportation (Non-Medical): No  Physical Activity: Not on file  Stress: No Stress Concern Present   Feeling of Stress : Only a little  Social Connections: Unknown   Frequency of Communication with Friends and Family: Not on file   Frequency of Social Gatherings with Friends and Family: Not on file   Attends Religious Services: Not on Electrical engineer or Organizations: Not on file   Attends Archivist Meetings: Not on file   Marital Status: Married  Human resources officer Violence: Not At Risk   Fear of Current or Ex-Partner: No   Emotionally Abused: No   Physically Abused: No   Sexually Abused: No   Current Meds  Medication Sig   bifidobacterium infantis (ALIGN) capsule Take 1 capsule by mouth daily.   Cholecalciferol (VITAMIN D) 125 MCG (5000 UT) CAPS Take by mouth daily.   HYDROcodone-acetaminophen (NORCO) 10-325 MG tablet Take 1 tablet by mouth every 6 (six) hours as needed.   VOLTAREN 1 % GEL Apply 1 application topically daily as needed. FOR HANDS   [DISCONTINUED] amLODipine (NORVASC) 10 MG tablet Take 1 tablet (10 mg total) by mouth daily.   [DISCONTINUED] losartan-hydrochlorothiazide (HYZAAR) 100-25 MG tablet Take 0.5 tablets by mouth daily. In am   [DISCONTINUED] omeprazole (PRILOSEC) 40 MG capsule Take 1 capsule (40 mg total) by mouth daily.   [DISCONTINUED] polyethylene glycol (MIRALAX / GLYCOLAX) packet Take 17 g by mouth daily as needed for mild constipation.   Allergies  Allergen Reactions   K-Dur [Potassium Chloride] Swelling    Cannot tolerate Potassium in any prepared form. Causes Face Swelling   Nsaids Other (See Comments)    H/o PUD with perforation   No results found for this or any previous visit (from the past 2160 hour(s)). Objective  Body mass index is 34.03 kg/m. Wt Readings from Last 3  Encounters:  04/12/20 151 lb 12.8 oz (68.9 kg)  11/10/19 168 lb 6.4 oz (76.4 kg)  11/04/19 166 lb (75.3 kg)   Temp Readings from Last 3 Encounters:  04/12/20 97.9 F (36.6 C) (Oral)  11/10/19 97.7 F (36.5 C) (Oral)  05/05/19 98 F (36.7 C)   BP Readings from Last 3 Encounters:  04/12/20 140/88  11/10/19 140/88  05/05/19 138/86   Pulse Readings from Last 3 Encounters:  04/12/20 76  11/10/19 74  05/05/19 88    Physical Exam Vitals and nursing note reviewed.  Constitutional:      Appearance: Normal appearance. She is well-developed and well-groomed. She is obese.  HENT:     Head: Normocephalic and atraumatic.  Eyes:     Conjunctiva/sclera: Conjunctivae normal.     Pupils: Pupils are equal, round, and reactive to light.  Cardiovascular:     Rate and Rhythm: Normal rate and regular rhythm.     Heart sounds: Normal heart sounds. No murmur heard.   Pulmonary:     Effort: Pulmonary effort is normal.     Breath sounds: Normal breath sounds.  Abdominal:     Tenderness: There is no abdominal tenderness.     Hernia: A hernia is present.  Skin:    General: Skin is warm and dry.  Neurological:     General: No focal deficit present.     Mental Status: She is alert and oriented to person, place, and time. Mental status is at baseline.     Gait: Gait normal.  Psychiatric:        Attention and Perception: Attention and perception normal.        Mood and Affect: Mood and affect normal.        Speech: Speech normal.        Behavior: Behavior normal. Behavior is cooperative.        Thought Content: Thought content normal.        Cognition and Memory: Cognition normal.        Judgment: Judgment normal.     Assessment  Plan  Hypertension associated with diabetes ([place]) - Plan: Comprehensive metabolic panel, Lipid panel, Hemoglobin A1c, CBC w/Diff, Urinalysis, Routine w reflex microscopic, Microalbumin / creatinine urine ratio norvasc 10 mg qd, hyzaar 100-25 mg qd A1C last  6.5 check today  Eye records Dr. Wyatt Portela seen 03/05/20 ROI sent today  Gastric ulcer, unspecified chronicity, unspecified whether gastric ulcer hemorrhage or perforation present - Plan: omeprazole (PRILOSEC) 40 MG capsule   HM Flu shot utd pna 23utd prevnar utd  Tdap utd Consider shingrix in future covid 19 3/3pfizer booster sch 12/11/19 grand [employer] of age window pap  Colonoscopy and mammogram pt refuses disc'edprev  Declines cologuard Declines DEXA  skin lesions never seen derm declines Hep C neg  Former smoker 1/2 ppd age max started age 38 y.o last cig in 2016 smoked 1/2 ppd x 15 years CT 2018 lung nodule but benign after serial f/u Seen [facility] 2/922 Dr. Wyatt Portela   Provider: Dr. Olivia Mackie McLean-Scocuzza-Internal Medicine

## 2020-04-12 NOTE — Addendum Note (Signed)
Addended by: Leeanne Rio on: 04/12/2020 04:05 PM   Modules accepted: Orders

## 2020-04-12 NOTE — Telephone Encounter (Signed)
losartan-hydrochlorothiazide (HYZAAR) 100-25 MG tablet 90 tablet 3 04/12/2020    Sig - Route: Take 0.5 tablets by mouth daily. In am - Oral   Sent to pharmacy as: losartan-hydrochlorothiazide (HYZAAR) 100-25 MG tablet   Notes to Pharmacy: D/c hctz 12.5   E-Prescribing Status: Receipt confirmed by pharmacy (04/12/2020 11:07 AM EDT)

## 2020-04-13 LAB — URINALYSIS, ROUTINE W REFLEX MICROSCOPIC
Bilirubin, UA: NEGATIVE
Glucose, UA: NEGATIVE
Nitrite, UA: NEGATIVE
RBC, UA: NEGATIVE
Specific Gravity, UA: 1.029 (ref 1.005–1.030)
Urobilinogen, Ur: 1 mg/dL (ref 0.2–1.0)
pH, UA: 6 (ref 5.0–7.5)

## 2020-04-13 LAB — MICROSCOPIC EXAMINATION
Bacteria, UA: NONE SEEN
Casts: NONE SEEN /lpf

## 2020-04-13 LAB — MICROALBUMIN / CREATININE URINE RATIO
Creatinine, Urine: 286.4 mg/dL
Microalb/Creat Ratio: 9 mg/g creat (ref 0–29)
Microalbumin, Urine: 24.8 ug/mL

## 2020-04-17 LAB — URINE CULTURE

## 2020-04-19 ENCOUNTER — Other Ambulatory Visit: Payer: Self-pay | Admitting: Internal Medicine

## 2020-04-19 DIAGNOSIS — N3000 Acute cystitis without hematuria: Secondary | ICD-10-CM

## 2020-04-19 MED ORDER — AMOXICILLIN-POT CLAVULANATE 875-125 MG PO TABS: 1.0000 | ORAL_TABLET | Freq: Two times a day (BID) | ORAL | 0 refills | Status: DC

## 2020-04-26 DIAGNOSIS — K439 Ventral hernia without obstruction or gangrene: Secondary | ICD-10-CM | POA: Diagnosis not present

## 2020-04-26 DIAGNOSIS — M48061 Spinal stenosis, lumbar region without neurogenic claudication: Secondary | ICD-10-CM | POA: Diagnosis not present

## 2020-07-03 DIAGNOSIS — F17210 Nicotine dependence, cigarettes, uncomplicated: Secondary | ICD-10-CM | POA: Diagnosis not present

## 2020-07-03 DIAGNOSIS — I10 Essential (primary) hypertension: Secondary | ICD-10-CM | POA: Diagnosis not present

## 2020-07-03 DIAGNOSIS — N281 Cyst of kidney, acquired: Secondary | ICD-10-CM | POA: Diagnosis not present

## 2020-07-03 DIAGNOSIS — K436 Other and unspecified ventral hernia with obstruction, without gangrene: Secondary | ICD-10-CM | POA: Diagnosis not present

## 2020-07-03 DIAGNOSIS — K56600 Partial intestinal obstruction, unspecified as to cause: Secondary | ICD-10-CM | POA: Diagnosis not present

## 2020-07-03 DIAGNOSIS — Z79899 Other long term (current) drug therapy: Secondary | ICD-10-CM | POA: Diagnosis not present

## 2020-07-03 DIAGNOSIS — K430 Incisional hernia with obstruction, without gangrene: Secondary | ICD-10-CM | POA: Diagnosis not present

## 2020-07-03 DIAGNOSIS — Z888 Allergy status to other drugs, medicaments and biological substances status: Secondary | ICD-10-CM | POA: Diagnosis not present

## 2020-07-03 DIAGNOSIS — K439 Ventral hernia without obstruction or gangrene: Secondary | ICD-10-CM | POA: Diagnosis not present

## 2020-07-03 DIAGNOSIS — K6389 Other specified diseases of intestine: Secondary | ICD-10-CM | POA: Diagnosis not present

## 2020-07-03 DIAGNOSIS — R109 Unspecified abdominal pain: Secondary | ICD-10-CM | POA: Diagnosis not present

## 2020-07-03 DIAGNOSIS — D259 Leiomyoma of uterus, unspecified: Secondary | ICD-10-CM | POA: Diagnosis not present

## 2020-07-06 ENCOUNTER — Telehealth: Payer: Self-pay

## 2020-07-06 NOTE — Telephone Encounter (Signed)
Transition Care Management Unsuccessful Follow-up Telephone Call  Date of discharge and from where:  07/05/20 from [facility]  Attempts:  1st Attempt  Reason for unsuccessful TCM follow-up call:  No answer/busy

## 2020-07-07 NOTE — Telephone Encounter (Signed)
Transition Care Management Unsuccessful Follow-up Telephone Call  Date of discharge and from where:  07/05/20 from [facility]  Attempts:  2nd Attempt  Reason for unsuccessful TCM follow-up call:  Unable to reach patient

## 2020-07-10 NOTE — Telephone Encounter (Signed)
Transition Care Management Follow-up Telephone Call No follow up appointment scheduled at this time. Patient declines hospital follow up due to no medication changes and feeling better. She plans to discuss her health with surgeon in the next 2 weeks. Will notify PCP with any additional questions, concerns, changes. Agrees to keep scheduled appointment with PCP in October.

## 2020-07-24 DIAGNOSIS — M48061 Spinal stenosis, lumbar region without neurogenic claudication: Secondary | ICD-10-CM | POA: Diagnosis not present

## 2020-07-24 DIAGNOSIS — K439 Ventral hernia without obstruction or gangrene: Secondary | ICD-10-CM | POA: Diagnosis not present

## 2020-08-08 ENCOUNTER — Other Ambulatory Visit (HOSPITAL_COMMUNITY): Payer: Self-pay

## 2020-08-14 DIAGNOSIS — K439 Ventral hernia without obstruction or gangrene: Secondary | ICD-10-CM | POA: Diagnosis not present

## 2020-09-08 ENCOUNTER — Other Ambulatory Visit (HOSPITAL_COMMUNITY): Payer: Self-pay

## 2020-10-08 DIAGNOSIS — Z23 Encounter for immunization: Secondary | ICD-10-CM | POA: Diagnosis not present

## 2020-10-16 ENCOUNTER — Encounter: Payer: Self-pay | Admitting: Internal Medicine

## 2020-10-17 DIAGNOSIS — H60392 Other infective otitis externa, left ear: Secondary | ICD-10-CM | POA: Diagnosis not present

## 2020-10-25 DIAGNOSIS — M48061 Spinal stenosis, lumbar region without neurogenic claudication: Secondary | ICD-10-CM | POA: Diagnosis not present

## 2020-10-25 DIAGNOSIS — Z6832 Body mass index (BMI) 32.0-32.9, adult: Secondary | ICD-10-CM | POA: Diagnosis not present

## 2020-10-25 DIAGNOSIS — Z5181 Encounter for therapeutic drug level monitoring: Secondary | ICD-10-CM | POA: Diagnosis not present

## 2020-10-25 DIAGNOSIS — I10 Essential (primary) hypertension: Secondary | ICD-10-CM | POA: Diagnosis not present

## 2020-10-25 DIAGNOSIS — K439 Ventral hernia without obstruction or gangrene: Secondary | ICD-10-CM | POA: Diagnosis not present

## 2020-11-06 ENCOUNTER — Ambulatory Visit: Payer: Medicare Other

## 2020-11-13 ENCOUNTER — Encounter: Payer: Self-pay | Admitting: Internal Medicine

## 2020-11-13 DIAGNOSIS — Z961 Presence of intraocular lens: Secondary | ICD-10-CM | POA: Diagnosis not present

## 2020-11-13 DIAGNOSIS — H0288A Meibomian gland dysfunction right eye, upper and lower eyelids: Secondary | ICD-10-CM | POA: Diagnosis not present

## 2020-11-13 DIAGNOSIS — H0288B Meibomian gland dysfunction left eye, upper and lower eyelids: Secondary | ICD-10-CM | POA: Diagnosis not present

## 2020-11-13 DIAGNOSIS — H35371 Puckering of macula, right eye: Secondary | ICD-10-CM | POA: Diagnosis not present

## 2020-12-02 ENCOUNTER — Other Ambulatory Visit: Payer: Self-pay | Admitting: Internal Medicine

## 2020-12-02 DIAGNOSIS — I10 Essential (primary) hypertension: Secondary | ICD-10-CM

## 2020-12-13 ENCOUNTER — Ambulatory Visit (INDEPENDENT_AMBULATORY_CARE_PROVIDER_SITE_OTHER): Payer: Medicare Other | Admitting: Internal Medicine

## 2020-12-13 ENCOUNTER — Other Ambulatory Visit: Payer: Self-pay

## 2020-12-13 ENCOUNTER — Encounter: Payer: Self-pay | Admitting: Internal Medicine

## 2020-12-13 VITALS — BP 132/72 | HR 99 | Temp 97.2°F | Ht <= 58 in | Wt 142.6 lb

## 2020-12-13 DIAGNOSIS — R7303 Prediabetes: Secondary | ICD-10-CM

## 2020-12-13 DIAGNOSIS — K259 Gastric ulcer, unspecified as acute or chronic, without hemorrhage or perforation: Secondary | ICD-10-CM | POA: Diagnosis not present

## 2020-12-13 DIAGNOSIS — I10 Essential (primary) hypertension: Secondary | ICD-10-CM | POA: Diagnosis not present

## 2020-12-13 DIAGNOSIS — R062 Wheezing: Secondary | ICD-10-CM | POA: Diagnosis not present

## 2020-12-13 DIAGNOSIS — K439 Ventral hernia without obstruction or gangrene: Secondary | ICD-10-CM

## 2020-12-13 DIAGNOSIS — Z1329 Encounter for screening for other suspected endocrine disorder: Secondary | ICD-10-CM

## 2020-12-13 DIAGNOSIS — E1159 Type 2 diabetes mellitus with other circulatory complications: Secondary | ICD-10-CM | POA: Diagnosis not present

## 2020-12-13 DIAGNOSIS — Z0000 Encounter for general adult medical examination without abnormal findings: Secondary | ICD-10-CM

## 2020-12-13 DIAGNOSIS — I152 Hypertension secondary to endocrine disorders: Secondary | ICD-10-CM | POA: Diagnosis not present

## 2020-12-13 DIAGNOSIS — Z23 Encounter for immunization: Secondary | ICD-10-CM | POA: Diagnosis not present

## 2020-12-13 MED ORDER — LOSARTAN POTASSIUM-HCTZ 100-25 MG PO TABS: ORAL_TABLET | ORAL | 3 refills | Status: DC

## 2020-12-13 MED ORDER — AMLODIPINE BESYLATE 10 MG PO TABS: 10.0000 mg | ORAL_TABLET | Freq: Every day | ORAL | 3 refills | Status: DC

## 2020-12-13 MED ORDER — OMEPRAZOLE 40 MG PO CPDR
40.0000 mg | DELAYED_RELEASE_CAPSULE | Freq: Every day | ORAL | 3 refills | Status: DC
Start: 2020-12-13 — End: 2021-07-04

## 2020-12-13 NOTE — Patient Instructions (Addendum)
Consider pfizer 03/10/21 or 04/07/21 5th dose   If wheezing gets worse call me back asap   Bronchospasm, Adult Bronchospasm is a tightening of the smooth muscle that wraps around the small airways in the lungs. When the muscle tightens, the small airways narrow. Narrowed airways limit the air you breathe in or out of your lungs. Inflammation (swelling) and more mucus (sputum) than usual can further irritate the airways. This can make it very hard to breathe. Bronchospasm can happen suddenly or over a period of time. What are the causes? Common causes of this condition include: An infection, such as a cold or sinus drainage. Exercise. Strong odors from aerosol sprays, and fumes from perfume, candles, and household cleaners. Cold air. Stress or strong emotions such as crying or laughing. What increases the risk? The following factors may make you more likely to develop this condition: Having asthma. Smoking or being around someone who smokes (secondhand smoke). Seasonal allergies, such as pollen or mold. Allergic reaction (anaphylaxis) to food, medicine, or insect bites or stings. What are the signs or symptoms? Symptoms of this condition include: Making a high-pitched whistling sound when you breathe, most often when you breathe out (wheezing). Coughing. Chest tightness. Shortness of breath. Decreased ability to exercise. Noisy breathing or a high-pitched cough. How is this diagnosed? This condition may be diagnosed based on your medical history and a physical exam. Your health care provider may also perform tests, including: A chest X-ray. Lung function tests. How is this treated? This condition may be treated by: Using inhaled medicines. These open up (relax) the airways and help you breathe. They can be taken with a metered dose inhaler or a nebulizer device. Taking corticosteroid medicines. These may be given to reduce inflammation and swelling. Removing the irritant or trigger  that started the bronchospasm. Follow these instructions at home: Medicines Take over-the-counter and prescription medicines only as told by your health care provider. If you need to use an inhaler or nebulizer to take your medicine, ask your health care provider how to use it correctly. You may be given a spacer to use with your inhaler. This makes it easier to get the medicine from the inhaler into your lungs. Lifestyle Do not use any products that contain nicotine or tobacco. These products include cigarettes, chewing tobacco, and vaping devices, such as e-cigarettes. If you need help quitting, ask your health care provider. Keep track of things that trigger your bronchospasm. Avoid these if possible. When pollen, air pollution, or humidity levels are bad, keep windows closed and use an air conditioner or go to places that have air conditioning. Find ways to manage stress and your emotions, such as mindfulness, relaxation, or breathing exercises. Activity Some people have bronchospasm when they exercise. This is called exercise-induced bronchoconstriction (EIB). If you have this problem, talk with your health care provider about how to manage EIB. Some tips include: Using your fast-acting inhaler before exercise. Exercising indoors if it is very cold or humid, or if the pollen and mold counts are high. Warming up and cool down before and after exercise. Stopping exercising right away if your symptoms start or get worse. General instructions If you have asthma, make sure you have an asthma action plan. Stay up to date on your immunizations. Keep all follow-up visits. This is important. Get help right away if: You have trouble breathing. Your wheezing and coughing do not get better after taking your medicine. You have chest pain. You have trouble speaking more than  one-word sentences. These symptoms may be an emergency. Get help right away. Call 911. Do not wait to see if the symptoms  will go away. Do not drive yourself to the hospital. Summary Bronchospasm is a tightening of the smooth muscle that wraps around the small airways in the lungs. Some people have bronchospasm when they exercise. This is called exercise-induced bronchoconstriction (EIB). If you have this problem, talk with your health care provider about how to manage EIB. Do not use any products that contain nicotine or tobacco. These products include cigarettes, chewing tobacco, and vaping devices, such as e-cigarettes. If you need help quitting, ask your health care provider. Get help right away if your wheezing and coughing do not get better after taking your medicine. This information is not intended to replace advice given to you by your health care provider. Make sure you discuss any questions you have with your health care provider. Document Revised: 08/07/2020 Document Reviewed: 08/07/2020 Elsevier Patient Education  [place].

## 2020-12-13 NOTE — Progress Notes (Addendum)
Chief Complaint  Patient presents with   Follow-up   Annual  1. Htn controlled on norvasc 10 mg qd hyzaar 100-25 mg qd  2. Prediabetes recheck A1C today she has lost weight walking more with 13 week puppy and eating less initially when danny husband died 8 months ago and does not want to gain the wt back  Declines cxr and further imaging to work up weight loss today   Review of Systems  Constitutional:  Positive for weight loss.  HENT:  Negative for hearing loss.   Eyes:  Negative for blurred vision.  Respiratory:  Negative for shortness of breath.   Cardiovascular:  Negative for chest pain.  Gastrointestinal:  Negative for abdominal pain and blood in stool.  Genitourinary:  Negative for dysuria.  Musculoskeletal:  Negative for falls and joint pain.  Skin:  Negative for rash.  Neurological:  Negative for headaches.  Psychiatric/Behavioral:  Negative for depression.   Past Medical History:  Diagnosis Date   Abnormal finding on Pap smear    hx abnl pap   Arthritis    Bronchitis    hx of-many yrs ago   Constipation    takes Miralax daily   H/O hernia repair    with mesh    Hypertension    takes Diltiazem and HCTZ daily   Hypertension associated with diabetes ([place]) 04/12/2020   Insomnia    takes Ambien nightly   Joint pain    Joint swelling    Low back pain    Menopausal syndrome    Obesity    Perforated gastric ulcer ([place])    Tobacco abuse    Past Surgical History:  Procedure Laterality Date   CATARACT EXTRACTION Bilateral    [facility]   CHOLECYSTECTOMY N/A 05/05/2015   Procedure: LAPAROSCOPIC CHOLECYSTECTOMY;  Surgeon: Rolm Bookbinder, MD;  Location: [place];  Service: General;  Laterality: N/A;   DILATION AND CURETTAGE OF UTERUS     LAPAROTOMY N/A 10/16/2014   Procedure: EXPLORATORY LAPAROTOMY, REPAIR OF GASTRIC ULCER;  Surgeon: Georganna Skeans, MD;  Location: [place];  Service: General;  Laterality: N/A;   MAXIMUM ACCESS (MAS)POSTERIOR LUMBAR INTERBODY FUSION (PLIF)  1 LEVEL N/A 11/04/2012   Procedure: Lumbar four-five Maximum access posterior lumbar interbody fusion with Harlin Heys;  Surgeon: Eustace Moore, MD;  [facility]: [facility];  Service: Neurosurgery;  Laterality: N/A;  Lumbar four-five Maximum access posterior lumbar interbody fusion with Nuvasive   TONSILLECTOMY     VENTRAL HERNIA REPAIR  01/24/2011   Procedure: LAPAROSCOPIC VENTRAL HERNIA;  Surgeon: Adin Hector, MD;  [facility];  Service: General;  Laterality: N/A;  with Mesh   Family History  Problem Relation Age of Onset   Cancer Brother        renal ,also sister, skin cancer    COPD Brother    Hypertension Brother    Diabetes Brother    Kidney disease Brother    Cancer Mother        Breast Cancer   Diabetes Mother    Hypertension Mother    Glaucoma Mother    Diabetes Sister    Hypertension Sister    Heart disease Sister    Pancreatic cancer Sister    Diabetes Brother        and sister   Breast cancer Other        sister   Kidney disease Other    Social History   Socioeconomic History   Marital status: Widowed  Spouse name: Not on file   Number of children: Not on file   Years of education: Not on file   Highest education level: Not on file  Occupational History   Occupation: retired  Tobacco Use   Smoking status: Former    Packs/day: 0.25    Years: 25.00    Pack years: 6.25    Types: Cigarettes    Quit date: 10/16/2014    Years since quitting: 6.1   Smokeless tobacco: Never   Tobacco comments:    Used Electronic Cigarettes until quit  Substance and Sexual Activity   Alcohol use: No    Alcohol/week: 0.0 standard drinks   Drug use: No   Sexual activity: Not Currently    Birth control/protection: Post-menopausal  Other Topics Concern   Not on file  Social History Narrative   gavidia 1   Para 1    aborta 0   1 daughter       Social Determinants of Health   Financial Resource Strain: Not on file  Food Insecurity: Not on file  Transportation  Needs: Not on file  Physical Activity: Not on file  Stress: Not on file  Social Connections: Not on file  Intimate Partner Violence: Not on file   Current Meds  Medication Sig   bifidobacterium infantis (ALIGN) capsule Take 1 capsule by mouth daily.   Cholecalciferol (VITAMIN D) 125 MCG (5000 UT) CAPS Take by mouth daily.   HYDROcodone-acetaminophen (NORCO) 10-325 MG tablet Take 1 tablet by mouth every 6 (six) hours as needed.   Multiple Vitamins-Minerals (MULTIVITAMIN GUMMIES ADULT PO) Take by mouth daily at 12 noon.   polyethylene glycol (MIRALAX / GLYCOLAX) 17 g packet Take 17 g by mouth daily as needed for mild constipation.   VOLTAREN 1 % GEL Apply 1 application topically daily as needed. FOR HANDS   [DISCONTINUED] amLODipine (NORVASC) 10 MG tablet Take 1 tablet (10 mg total) by mouth daily.   [DISCONTINUED] losartan-hydrochlorothiazide (HYZAAR) 100-25 MG tablet TAKE 1/2 TABLETS BY MOUTH DAILY. IN THE MORNING   [DISCONTINUED] omeprazole (PRILOSEC) 40 MG capsule Take 1 capsule (40 mg total) by mouth daily.   Allergies  Allergen Reactions   K-Dur [Potassium Chloride] Swelling    Cannot tolerate Potassium in any prepared form. Causes Face Swelling   Nsaids Other (See Comments)    H/o PUD with perforation   No results found for this or any previous visit (from the past 2160 hour(s)). Objective  Body mass index is 31.97 kg/m. Wt Readings from Last 3 Encounters:  12/13/20 142 lb 9.6 oz (64.7 kg)  04/12/20 151 lb 12.8 oz (68.9 kg)  11/10/19 168 lb 6.4 oz (76.4 kg)   Temp Readings from Last 3 Encounters:  12/13/20 (!) 97.2 F (36.2 C) (Temporal)  04/12/20 97.9 F (36.6 C) (Oral)  11/10/19 97.7 F (36.5 C) (Oral)   BP Readings from Last 3 Encounters:  12/13/20 132/72  04/12/20 140/88  11/10/19 140/88   Pulse Readings from Last 3 Encounters:  12/13/20 99  04/12/20 76  11/10/19 74    Physical Exam Vitals and nursing note reviewed.  Constitutional:      Appearance:  Normal appearance. She is well-developed and well-groomed.  HENT:     Head: Normocephalic and atraumatic.  Eyes:     Conjunctiva/sclera: Conjunctivae normal.     Pupils: Pupils are equal, round, and reactive to light.  Cardiovascular:     Rate and Rhythm: Normal rate and regular rhythm.     Heart  sounds: Normal heart sounds. No murmur heard. Pulmonary:     Effort: Pulmonary effort is normal.     Breath sounds: Wheezing present.     Comments: Wheezing rll smells like smoke though denies smoking Abdominal:     General: Abdomen is flat. Bowel sounds are normal.     Tenderness: There is no abdominal tenderness.     Hernia: A hernia is present.  Musculoskeletal:        General: No tenderness.  Skin:    General: Skin is warm and dry.  Neurological:     General: No focal deficit present.     Mental Status: She is alert and oriented to person, place, and time. Mental status is at baseline.     Cranial Nerves: Cranial nerves 2-12 are intact.     Gait: Gait is intact.  Psychiatric:        Attention and Perception: Attention and perception normal.        Mood and Affect: Mood and affect normal.        Speech: Speech normal.        Behavior: Behavior normal. Behavior is cooperative.        Thought Content: Thought content normal.        Cognition and Memory: Cognition and memory normal.        Judgment: Judgment normal.    Assessment  Plan  Annual physical exam Needs flu shot - Plan: Flu Vaccine QUAD High Dose(Fluad) Flu shot utd pna 23 utd   prevnar utd  Tdap utd Declines shingrix covid 19 4/4 pfizer booster sch 12/11/19 grand [employer] of age window pap  Colonoscopy and mammogram pt refuses disc'ed prev  Declines cologuard  Declines DEXA  skin lesions  never seen derm declines Hep C neg  Former smoker 1/2 ppd age max started age 49 y.o last cig in 2016 smoked 1/2 ppd x 15 years CT 2018 lung nodule but benign after serial f/u  Seen [facility] 03/08/20 Dr. Wyatt Portela for  new glasses  Rec healthy diet and exercise  [facility] seen 11/13/20  Essential hypertension improved Plan: losartan-hydrochlorothiazide (HYZAAR) 100-25 MG tablet, amLODipine (NORVASC) 10 MG tablet  Prediabetes Foot exam today  F/u [facility] had in 2022   Gastric ulcer, unspecified chronicity, unspecified whether gastric ulcer hemorrhage or perforation present - Plan: omeprazole (PRILOSEC) 40 MG capsule   Ventral hernia  Had f/u Dr. Arvin Collard in 07/2020 not sx'matic if so will have surgery  Provider: Dr. Olivia Mackie McLean-Scocuzza-Internal Medicine

## 2020-12-14 LAB — CBC WITH DIFFERENTIAL/PLATELET
Basophils Absolute: 0.1 10*3/uL (ref 0.0–0.1)
Basophils Relative: 1.3 % (ref 0.0–3.0)
Eosinophils Absolute: 0.2 10*3/uL (ref 0.0–0.7)
Eosinophils Relative: 1.9 % (ref 0.0–5.0)
HCT: 40.2 % (ref 36.0–46.0)
Hemoglobin: 13.3 g/dL (ref 12.0–15.0)
Lymphocytes Relative: 31.8 % (ref 12.0–46.0)
Lymphs Abs: 3 10*3/uL (ref 0.7–4.0)
MCHC: 33.1 g/dL (ref 30.0–36.0)
MCV: 90.3 fl (ref 78.0–100.0)
Monocytes Absolute: 0.5 10*3/uL (ref 0.1–1.0)
Monocytes Relative: 5.7 % (ref 3.0–12.0)
Neutro Abs: 5.5 10*3/uL (ref 1.4–7.7)
Neutrophils Relative %: 59.3 % (ref 43.0–77.0)
Platelets: 249 10*3/uL (ref 150.0–400.0)
RBC: 4.46 Mil/uL (ref 3.87–5.11)
RDW: 13.7 % (ref 11.5–15.5)
WBC: 9.3 10*3/uL (ref 4.0–10.5)

## 2020-12-14 LAB — COMPREHENSIVE METABOLIC PANEL
ALT: 14 U/L (ref 0–35)
AST: 15 U/L (ref 0–37)
Albumin: 4.2 g/dL (ref 3.5–5.2)
Alkaline Phosphatase: 73 U/L (ref 39–117)
BUN: 17 mg/dL (ref 6–23)
CO2: 28 mEq/L (ref 19–32)
Calcium: 9.4 mg/dL (ref 8.4–10.5)
Chloride: 100 mEq/L (ref 96–112)
Creatinine, Ser: 1.18 mg/dL (ref 0.40–1.20)
GFR: 45.14 mL/min — ABNORMAL LOW (ref >60.00)
Glucose, Bld: 156 mg/dL — ABNORMAL HIGH (ref 70–99)
Potassium: 3.5 mEq/L (ref 3.5–5.1)
Sodium: 138 mEq/L (ref 135–145)
Total Bilirubin: 0.5 mg/dL (ref 0.2–1.2)
Total Protein: 6.6 g/dL (ref 6.0–8.3)

## 2020-12-14 LAB — LIPID PANEL
Cholesterol: 147 mg/dL (ref 0–200)
HDL: 63.3 mg/dL (ref >39.00)
LDL Cholesterol: 62 mg/dL (ref 0–99)
NonHDL: 83.75
Total CHOL/HDL Ratio: 2
Triglycerides: 111 mg/dL (ref 0.0–149.0)
VLDL: 22.2 mg/dL (ref 0.0–40.0)

## 2020-12-14 LAB — HEMOGLOBIN A1C: Hgb A1c MFr Bld: 6.2 % (ref 4.6–6.5)

## 2020-12-14 LAB — TSH: TSH: 0.4 u[IU]/mL (ref 0.35–5.50)

## 2020-12-15 ENCOUNTER — Ambulatory Visit (INDEPENDENT_AMBULATORY_CARE_PROVIDER_SITE_OTHER): Payer: Medicare Other

## 2020-12-15 ENCOUNTER — Other Ambulatory Visit: Payer: Self-pay

## 2020-12-15 VITALS — Ht <= 58 in | Wt 142.0 lb

## 2020-12-15 DIAGNOSIS — I10 Essential (primary) hypertension: Secondary | ICD-10-CM

## 2020-12-15 DIAGNOSIS — Z0000 Encounter for general adult medical examination without abnormal findings: Secondary | ICD-10-CM

## 2020-12-15 NOTE — Patient Instructions (Addendum)
Sally Douglas , Thank you for taking time to come for your Medicare Wellness Visit. I appreciate your ongoing commitment to your health goals. Please review the following plan we discussed and let me know if I can assist you in the future.   These are the goals we discussed:  Goals       Patient Stated     I would like to lose about 5 pounds (pt-stated)      Stay active Healthy diet Weight goal 137lb      Other     Follow up with Primary Care Provider      As needed        This is a list of the screening recommended for you and due dates:  Health Maintenance  Topic Date Due   Zoster (Shingles) Vaccine (1 of 2) 03/15/2021*   DEXA scan (bone density measurement)  12/13/2021*   Colon Cancer Screening  12/13/2021*   Eye exam for diabetics  03/08/2021   Hemoglobin A1C  06/12/2021   Complete foot exam   12/13/2021   Tetanus Vaccine  11/09/2029   Pneumonia Vaccine  Completed   Flu Shot  Completed   COVID-19 Vaccine  Completed   Hepatitis C Screening: USPSTF Recommendation to screen - Ages 18-79 yo.  Completed   HPV Vaccine  Aged Out  *Topic was postponed. The date shown is not the original due date.    Advanced directives: not yet completed  Conditions/risks identified: none new  Follow up in one year for your annual wellness visit    Preventive Care 65 Years and Older, Female Preventive care refers to lifestyle choices and visits with your health care provider that can promote health and wellness. What does preventive care include? A yearly physical exam. This is also called an annual well check. Dental exams once or twice a year. Routine eye exams. Ask your health care provider how often you should have your eyes checked. Personal lifestyle choices, including: Daily care of your teeth and gums. Regular physical activity. Eating a healthy diet. Avoiding tobacco and drug use. Limiting alcohol use. Practicing safe sex. Taking low-dose aspirin every day. Taking vitamin  and mineral supplements as recommended by your health care provider. What happens during an annual well check? The services and screenings done by your health care provider during your annual well check will depend on your age, overall health, lifestyle risk factors, and family history of disease. Counseling  Your health care provider may ask you questions about your: Alcohol use. Tobacco use. Drug use. Emotional well-being. Home and relationship well-being. Sexual activity. Eating habits. History of falls. Memory and ability to understand (cognition). Work and work Statistician. Reproductive health. Screening  You may have the following tests or measurements: Height, weight, and BMI. Blood pressure. Lipid and cholesterol levels. These may be checked every 5 years, or more frequently if you are over 57 years old. Skin check. Lung cancer screening. You may have this screening every year starting at age 68 if you have a 30-pack-year history of smoking and currently smoke or have quit within the past 15 years. Fecal occult blood test (FOBT) of the stool. You may have this test every year starting at age 54. Flexible sigmoidoscopy or colonoscopy. You may have a sigmoidoscopy every 5 years or a colonoscopy every 10 years starting at age 88. Hepatitis C blood test. Hepatitis B blood test. Sexually transmitted disease (STD) testing. Diabetes screening. This is done by checking your blood sugar (glucose) after  you have not eaten for a while (fasting). You may have this done every 1-3 years. Bone density scan. This is done to screen for osteoporosis. You may have this done starting at age 62. Mammogram. This may be done every 1-2 years. Talk to your health care provider about how often you should have regular mammograms. Talk with your health care provider about your test results, treatment options, and if necessary, the need for more tests. Vaccines  Your health care provider may recommend  certain vaccines, such as: Influenza vaccine. This is recommended every year. Tetanus, diphtheria, and acellular pertussis (Tdap, Td) vaccine. You may need a Td booster every 10 years. Zoster vaccine. You may need this after age 31. Pneumococcal 13-valent conjugate (PCV13) vaccine. One dose is recommended after age 65. Pneumococcal polysaccharide (PPSV23) vaccine. One dose is recommended after age 47. Talk to your health care provider about which screenings and vaccines you need and how often you need them. This information is not intended to replace advice given to you by your health care provider. Make sure you discuss any questions you have with your health care provider. Document Released: 02/10/2015 Document Revised: 10/04/2015 Document Reviewed: 11/15/2014 Elsevier Interactive Patient Education  2017 [place] Prevention in the Home Falls can cause injuries. They can happen to people of all ages. There are many things you can do to make your home safe and to help prevent falls. What can I do on the outside of my home? Regularly fix the edges of walkways and driveways and fix any cracks. Remove anything that might make you trip as you walk through a door, such as a raised step or threshold. Trim any bushes or trees on the path to your home. Use bright outdoor lighting. Clear any walking paths of anything that might make someone trip, such as rocks or tools. Regularly check to see if handrails are loose or broken. Make sure that both sides of any steps have handrails. Any raised decks and porches should have guardrails on the edges. Have any leaves, snow, or ice cleared regularly. Use sand or salt on walking paths during winter. Clean up any spills in your garage right away. This includes oil or grease spills. What can I do in the bathroom? Use night lights. Install grab bars by the toilet and in the tub and shower. Do not use towel bars as grab bars. Use non-skid mats or  decals in the tub or shower. If you need to sit down in the shower, use a plastic, non-slip stool. Keep the floor dry. Clean up any water that spills on the floor as soon as it happens. Remove soap buildup in the tub or shower regularly. Attach bath mats securely with double-sided non-slip rug tape. Do not have throw rugs and other things on the floor that can make you trip. What can I do in the bedroom? Use night lights. Make sure that you have a light by your bed that is easy to reach. Do not use any sheets or blankets that are too big for your bed. They should not hang down onto the floor. Have a firm chair that has side arms. You can use this for support while you get dressed. Do not have throw rugs and other things on the floor that can make you trip. What can I do in the kitchen? Clean up any spills right away. Avoid walking on wet floors. Keep items that you use a lot in easy-to-reach places. If you  need to reach something above you, use a strong step stool that has a grab bar. Keep electrical cords out of the way. Do not use floor polish or wax that makes floors slippery. If you must use wax, use non-skid floor wax. Do not have throw rugs and other things on the floor that can make you trip. What can I do with my stairs? Do not leave any items on the stairs. Make sure that there are handrails on both sides of the stairs and use them. Fix handrails that are broken or loose. Make sure that handrails are as long as the stairways. Check any carpeting to make sure that it is firmly attached to the stairs. Fix any carpet that is loose or worn. Avoid having throw rugs at the top or bottom of the stairs. If you do have throw rugs, attach them to the floor with carpet tape. Make sure that you have a light switch at the top of the stairs and the bottom of the stairs. If you do not have them, ask someone to add them for you. What else can I do to help prevent falls? Wear shoes that: Do not  have high heels. Have rubber bottoms. Are comfortable and fit you well. Are closed at the toe. Do not wear sandals. If you use a stepladder: Make sure that it is fully opened. Do not climb a closed stepladder. Make sure that both sides of the stepladder are locked into place. Ask someone to hold it for you, if possible. Clearly mark and make sure that you can see: Any grab bars or handrails. First and last steps. Where the edge of each step is. Use tools that help you move around (mobility aids) if they are needed. These include: Canes. Walkers. Scooters. Crutches. Turn on the lights when you go into a dark area. Replace any light bulbs as soon as they burn out. Set up your furniture so you have a clear path. Avoid moving your furniture around. If any of your floors are uneven, fix them. If there are any pets around you, be aware of where they are. Review your medicines with your doctor. Some medicines can make you feel dizzy. This can increase your chance of falling. Ask your doctor what other things that you can do to help prevent falls. This information is not intended to replace advice given to you by your health care provider. Make sure you discuss any questions you have with your health care provider. Document Released: 11/10/2008 Document Revised: 06/22/2015 Document Reviewed: 02/18/2014 Elsevier Interactive Patient Education  2017 [place].  Opioid Pain Medicine Management Opioids are powerful medicines that are used to treat moderate to severe pain. When used for short periods of time, they can help you to: Sleep better. Do better in physical or occupational therapy. Feel better in the first few days after an injury. Recover from surgery. Opioids should be taken with the supervision of a trained health care provider. They should be taken for the shortest period of time possible. This is because opioids can be addictive, and the longer you take opioids, the greater your  risk of addiction. This addiction can also be called opioid use disorder. What are the risks? Using opioid pain medicines for longer than 3 days increases your risk of side effects. Side effects include: Constipation. Nausea and vomiting. Breathing difficulties (respiratory depression). Drowsiness. Confusion. Opioid use disorder. Itching. Taking opioid pain medicine for a long period of time can affect your ability to  do daily tasks. It also puts you at risk for: Motor vehicle crashes. Depression. Suicide. Heart attack. Overdose, which can be life-threatening. What is a pain treatment plan? A pain treatment plan is an agreement between you and your health care provider. Pain is unique to each person, and treatments vary depending on your condition. To manage your pain, you and your health care provider need to work together. To help you do this: Discuss the goals of your treatment, including how much pain you might expect to have and how you will manage the pain. Review the risks and benefits of taking opioid medicines. Remember that a good treatment plan uses more than one approach and minimizes the chance of side effects. Be honest about the amount of medicines you take and about any drug or alcohol use. Get pain medicine prescriptions from only one health care provider. Pain can be managed with many types of alternative treatments. Ask your health care provider to refer you to one or more specialists who can help you manage pain through: Physical or occupational therapy. Counseling (cognitive behavioral therapy). Good nutrition. Biofeedback. Massage. Meditation. Non-opioid medicine. Following a gentle exercise program. How to use opioid pain medicine Taking medicine Take your pain medicine exactly as told by your health care provider. Take it only when you need it. If your pain gets less severe, you may take less than your prescribed dose if your health care provider  approves. If you are not having pain, do nottake pain medicine unless your health care provider tells you to take it. If your pain is severe, do nottry to treat it yourself by taking more pills than instructed on your prescription. Contact your health care provider for help. Write down the times when you take your pain medicine. It is easy to become confused while on pain medicine. Writing the time can help you avoid overdose. Take other over-the-counter or prescription medicines only as told by your health care provider. Keeping yourself and others safe  While you are taking opioid pain medicine: Do not drive, use machinery, or power tools. Do not sign legal documents. Do not drink alcohol. Do not take sleeping pills. Do not supervise children by yourself. Do not do activities that require climbing or being in high places. Do not go to a lake, river, ocean, spa, or swimming pool. Do not share your pain medicine with anyone. Keep pain medicine in a locked cabinet or in a secure area where pets and children cannot reach it. Stopping your use of opioids If you have been taking opioid medicine for more than a few weeks, you may need to slowly decrease (taper) how much you take until you stop completely. Tapering your use of opioids can decrease your risk of symptoms of withdrawal, such as: Pain and cramping in the abdomen. Nausea. Sweating. Sleepiness. Restlessness. Uncontrollable shaking (tremors). Cravings for the medicine. Do not attempt to taper your use of opioids on your own. Talk with your health care provider about how to do this. Your health care provider may prescribe a step-down schedule based on how much medicine you are taking and how long you have been taking it. Getting rid of leftover pills Do not save any leftover pills. Get rid of leftover pills safely by: Taking the medicine to a prescription take-back program. This is usually offered by the county or law  enforcement. Bringing them to a pharmacy that has a drug disposal container. Flushing them down the toilet. Check the label or package insert  of your medicine to see whether this is safe to do. Throwing them out in the trash. Check the label or package insert of your medicine to see whether this is safe to do. If it is safe to throw it out, remove the medicine from the original container, put it into a sealable bag or container, and mix it with used coffee grounds, food scraps, dirt, or cat litter before putting it in the trash. Follow these instructions at home: Activity Do exercises as told by your health care provider. Avoid activities that make your pain worse. Return to your normal activities as told by your health care provider. Ask your health care provider what activities are safe for you. General instructions You may need to take these actions to prevent or treat constipation: Drink enough fluid to keep your urine pale yellow. Take over-the-counter or prescription medicines. Eat foods that are high in fiber, such as beans, whole grains, and fresh fruits and vegetables. Limit foods that are high in fat and processed sugars, such as fried or sweet foods. Keep all follow-up visits. This is important. Where to find support If you have been taking opioids for a long time, you may benefit from receiving support for quitting from a local support group or counselor. Ask your health care provider for a referral to these resources in your area. Where to find more information Centers for Disease Control and Prevention (CDC): http://www.wolf.info/ [place] Food and Drug Administration (FDA): [place] Get help right away if: You may have taken too much of an opioid (overdosed). Common symptoms of an overdose: Your breathing is slower or more shallow than normal. You have a very slow heartbeat (pulse). You have slurred speech. You have nausea and vomiting. Your pupils become very small. You have other  potential symptoms: You are very confused. You faint or feel like you will faint. You have cold, clammy skin. You have blue lips or fingernails. You have thoughts of harming yourself or harming others. These symptoms may represent a serious problem that is an emergency. Do not wait to see if the symptoms will go away. Get medical help right away. Call your local emergency services (911 in the [place] Do not drive yourself to the hospital.  If you ever feel like you may hurt yourself or others, or have thoughts about taking your own life, get help right away. Go to your nearest emergency department or: Call your local emergency services (911 in the [place] Call the [facility] 878-466-3830 in the [place] Call a suicide crisis helpline, such as the [facility] at (901)208-0569 or 988 in the [place]. This is open 24 hours a day in the [place] Text the Crisis Text Line at (610) 776-1546 (in the [place].). Summary Opioid medicines can help you manage moderate to severe pain for a short period of time. A pain treatment plan is an agreement between you and your health care provider. Discuss the goals of your treatment, including how much pain you might expect to have and how you will manage the pain. If you think that you or someone else may have taken too much of an opioid, get medical help right away. This information is not intended to replace advice given to you by your health care provider. Make sure you discuss any questions you have with your health care provider. Document Revised: 08/09/2020 Document Reviewed: 04/26/2020 Elsevier Patient Education  [place].

## 2020-12-15 NOTE — Progress Notes (Signed)
Subjective:   Sally Douglas is a 75 y.o. female who presents for Medicare Annual (Subsequent) preventive examination.  Review of Systems    No ROS.  Medicare Wellness Virtual Visit.  Visual/audio telehealth visit, UTA vital signs.   See social history for additional risk factors.   Cardiac Risk Factors include: advanced age (>42men, >49 women)     Objective:    Today's Vitals   12/15/20 0852  Weight: 142 lb (64.4 kg)  Height: 4\' 8"  (1.422 m)   Body mass index is 31.84 kg/m.  Advanced Directives 12/15/2020 11/04/2019 10/30/2018 05/02/2015 05/02/2015 10/16/2014 10/27/2012  Does Patient Have a Medical Advance Directive? No No No No No No Patient does not have advance directive;Patient would like information  Would patient like information on creating a medical advance directive? No - Patient declined No - Patient declined No - Patient declined - - - -    Current Medications (verified) Outpatient Encounter Medications as of 12/15/2020  Medication Sig   amLODipine (NORVASC) 10 MG tablet Take 1 tablet (10 mg total) by mouth daily.   bifidobacterium infantis (ALIGN) capsule Take 1 capsule by mouth daily.   Cholecalciferol (VITAMIN D) 125 MCG (5000 UT) CAPS Take by mouth daily.   HYDROcodone-acetaminophen (NORCO) 10-325 MG tablet Take 1 tablet by mouth every 6 (six) hours as needed.   losartan-hydrochlorothiazide (HYZAAR) 100-25 MG tablet TAKE 1/2 TABLETS BY MOUTH DAILY. IN THE MORNING   Multiple Vitamins-Minerals (MULTIVITAMIN GUMMIES ADULT PO) Take by mouth daily at 12 noon.   omeprazole (PRILOSEC) 40 MG capsule Take 1 capsule (40 mg total) by mouth daily.   polyethylene glycol (MIRALAX / GLYCOLAX) 17 g packet Take 17 g by mouth daily as needed for mild constipation.   VOLTAREN 1 % GEL Apply 1 application topically daily as needed. FOR HANDS   No facility-administered encounter medications on file as of 12/15/2020.    Allergies (verified) K-dur [potassium chloride] and Nsaids    History: Past Medical History:  Diagnosis Date   Abnormal finding on Pap smear    hx abnl pap   Arthritis    Bronchitis    hx of-many yrs ago   Constipation    takes Miralax daily   H/O hernia repair    with mesh    Hypertension    takes Diltiazem and HCTZ daily   Hypertension associated with diabetes ([place]) 04/12/2020   Insomnia    takes Ambien nightly   Joint pain    Joint swelling    Low back pain    Menopausal syndrome    Obesity    Perforated gastric ulcer ([place])    Tobacco abuse    Past Surgical History:  Procedure Laterality Date   CATARACT EXTRACTION Bilateral    Groat eye   CHOLECYSTECTOMY N/A 05/05/2015   Procedure: LAPAROSCOPIC CHOLECYSTECTOMY;  Surgeon: Rolm Bookbinder, MD;  Location: [place];  Service: General;  Laterality: N/A;   DILATION AND CURETTAGE OF UTERUS     LAPAROTOMY N/A 10/16/2014   Procedure: EXPLORATORY LAPAROTOMY, REPAIR OF GASTRIC ULCER;  Surgeon: Georganna Skeans, MD;  Location: [place];  Service: General;  Laterality: N/A;   MAXIMUM ACCESS (MAS)POSTERIOR LUMBAR INTERBODY FUSION (PLIF) 1 LEVEL N/A 11/04/2012   Procedure: Lumbar four-five Maximum access posterior lumbar interbody fusion with Harlin Heys;  Surgeon: Eustace Moore, MD;  [facility];  Service: Neurosurgery;  Laterality: N/A;  Lumbar four-five Maximum access posterior lumbar interbody fusion with Nuvasive   TONSILLECTOMY     VENTRAL  HERNIA REPAIR  01/24/2011   Procedure: LAPAROSCOPIC VENTRAL HERNIA;  Surgeon: Adin Hector, MD;  Location: [facility];  Service: General;  Laterality: N/A;  with Mesh   Family History  Problem Relation Age of Onset   Cancer Brother        renal ,also sister, skin cancer    COPD Brother    Hypertension Brother    Diabetes Brother    Kidney disease Brother    Cancer Mother        Breast Cancer   Diabetes Mother    Hypertension Mother    Glaucoma Mother    Diabetes Sister    Hypertension Sister    Heart disease Sister    Pancreatic cancer  Sister    Diabetes Brother        and sister   Breast cancer Other        sister   Kidney disease Other    Social History   Socioeconomic History   Marital status: Widowed    Spouse name: Not on file   Number of children: Not on file   Years of education: Not on file   Highest education level: Not on file  Occupational History   Occupation: retired  Tobacco Use   Smoking status: Former    Packs/day: 0.25    Years: 25.00    Pack years: 6.25    Types: Cigarettes    Quit date: 10/16/2014    Years since quitting: 6.1   Smokeless tobacco: Never   Tobacco comments:    Used Electronic Cigarettes until quit  Substance and Sexual Activity   Alcohol use: No    Alcohol/week: 0.0 standard drinks   Drug use: No   Sexual activity: Not Currently    Birth control/protection: Post-menopausal  Other Topics Concern   Not on file  Social History Narrative   gavidia 1   Para 1    aborta 0   1 daughter       Social Determinants of Health   Financial Resource Strain: Low Risk    Difficulty of Paying Living Expenses: Not hard at all  Food Insecurity: No Food Insecurity   Worried About Charity fundraiser in the Last Year: Never true   [place] in the Last Year: Never true  Transportation Needs: No Transportation Needs   Lack of Transportation (Medical): No   Lack of Transportation (Non-Medical): No  Physical Activity: Sufficiently Active   Days of Exercise per Week: 5 days   Minutes of Exercise per Session: 30 min  Stress: No Stress Concern Present   Feeling of Stress : Only a little  Social Connections: Unknown   Frequency of Communication with Friends and Family: Not on file   Frequency of Social Gatherings with Friends and Family: Not on file   Attends Religious Services: Not on Electrical engineer or Organizations: Not on file   Attends Archivist Meetings: Not on file   Marital Status: Married    Tobacco Counseling Counseling given: Not  Answered Tobacco comments: Used Electronic Cigarettes until quit   Clinical Intake:  Pre-visit preparation completed: Yes        Diabetes: No  How often do you need to have someone help you when you read instructions, pamphlets, or other written materials from your doctor or pharmacy?: 1 - Never   Interpreter Needed?: No      Activities of Daily Living In your present state of health,  do you have any difficulty performing the following activities: 12/15/2020  Hearing? N  Vision? N  Difficulty concentrating or making decisions? N  Walking or climbing stairs? N  Dressing or bathing? N  Doing errands, shopping? N  Preparing Food and eating ? N  Using the Toilet? N  In the past six months, have you accidently leaked urine? N  Do you have problems with loss of bowel control? N  Managing your Medications? N  Managing your Finances? N  Housekeeping or managing your Housekeeping? N  Some recent data might be hidden    Patient Care Team: McLean-Scocuzza, Nino Glow, MD as PCP - General (Internal Medicine)  Indicate any recent Medical Services you may have received from other than [facility] providers in the past year (date may be approximate).     Assessment:   This is a routine wellness examination for [place].   Virtual Visit via Telephone Note  I connected with  [name] on 12/15/20 at  8:15 AM EST by telephone and verified that I am speaking with the correct person using two identifiers.  Location: Patient: home Provider: office Persons participating in the virtual visit: patient/Nurse Health Advisor   I discussed the limitations, risks, security and privacy concerns of performing an evaluation and management service by telephone and the availability of in person appointments. The patient expressed understanding and agreed to proceed.  Interactive audio and video telecommunications were attempted between this nurse and patient, however failed, due to patient having  technical difficulties OR patient did not have access to video capability.  We continued and completed visit with audio only.  Some vital signs may be absent or patient reported.   Hearing/Vision screen Hearing Screening - Comments:: Patient is able to hear conversational tones without difficulty.  No issues reported. Vision Screening - Comments:: Wears corrective lenses  Cataract extraction, bilateral  They have seen their ophthalmologist in the last 12 months.   Dietary issues and exercise activities discussed: Current Exercise Habits: Home exercise routine, Type of exercise: walking, Intensity: Mild  Regular diet   Goals Addressed               This Visit's Progress     Patient Stated     I would like to lose about 5 pounds (pt-stated)        Stay active Healthy diet Weight goal 137lb       Depression Screen PHQ 2/9 Scores 12/15/2020 12/13/2020 11/04/2019 05/05/2019 11/03/2018 10/30/2018 03/16/2015  PHQ - 2 Score 0 0 0 0 0 0 0    Fall Risk Fall Risk  12/15/2020 04/12/2020 11/10/2019 11/04/2019 05/05/2019  Falls in the past year? 0 0 0 0 0  Number falls in past yr: 0 0 0 - 0  Injury with Fall? - 0 0 - 0  Risk for fall due to : - - - - -  Follow up Falls evaluation completed Falls evaluation completed Falls evaluation completed Falls evaluation completed Falls evaluation completed    [place]: Adequate lighting in your home to reduce risk of falls? Yes   ASSISTIVE DEVICES UTILIZED TO PREVENT FALLS: Life alert? No  Use of a cane, walker or w/c? No   TIMED UP AND GO: Was the test performed? No .   Cognitive Function:  Patient is alert and oriented x3.    6CIT Screen 12/15/2020 10/30/2018  What Year? - 0 points  What month? - 0 points  What time? -  0 points  Count back from 20 - 0 points  Months in reverse 0 points 0 points  Repeat phrase - 0 points  Total Score - 0    Immunizations Immunization History  Administered Date(s)  Administered   Fluad Quad(high Dose 65+) 11/03/2018, 11/10/2019, 12/13/2020   Influenza, High Dose Seasonal PF 11/15/2015, 11/04/2016, 11/22/2017   Influenza,inj,Quad PF,6+ Mos 11/05/2012, 11/29/2013, 10/18/2014   PFIZER(Purple Top)SARS-COV-2 Vaccination 03/26/2019, 04/21/2019, 12/14/2019   Pfizer Covid-19 Vaccine Bivalent Booster 3yrs & up 10/08/2020   Pneumococcal Conjugate-13 11/29/2013   Pneumococcal Polysaccharide-23 05/16/2011   Td 07/15/2008   Tdap 11/10/2019   Screening Tests Health Maintenance  Topic Date Due   Zoster Vaccines- Shingrix (1 of 2) 03/15/2021 (Originally 04/28/1964)   DEXA SCAN  12/13/2021 (Originally 04/29/2010)   COLONOSCOPY (Pts 45-9yrs Insurance coverage will need to be confirmed)  12/13/2021 (Originally 04/29/1990)   OPHTHALMOLOGY EXAM  03/08/2021   HEMOGLOBIN A1C  06/12/2021   FOOT EXAM  12/13/2021   TETANUS/TDAP  11/09/2029   Pneumonia Vaccine 1+ Years old  Completed   INFLUENZA VACCINE  Completed   COVID-19 Vaccine  Completed   Hepatitis C Screening  Completed   HPV VACCINES  Aged Out    Health Maintenance There are no preventive care reminders to display for this patient.  Lung Cancer Screening: (Low Dose CT Chest recommended if Age 41-80 years, 30 pack-year currently smoking OR have quit w/in 15years.) does not qualify.   Vision Screening: Recommended annual ophthalmology exams for early detection of glaucoma and other disorders of the eye.  Dental Screening: Recommended annual dental exams for proper oral hygiene. Dentures.   Community Resource Referral / Chronic Care Management: CRR required this visit?  No   CCM required this visit?  No      Plan:   Keep all routine maintenance appointments.   I have personally reviewed and noted the following in the patient's chart:   Medical and social history Use of alcohol, tobacco or illicit drugs  Current medications and supplements including opioid prescriptions. Taking opioid. Followed by  pcp. Functional ability and status Nutritional status Physical activity Advanced directives List of other physicians Hospitalizations, surgeries, and ER visits in previous 12 months Vitals Screenings to include cognitive, depression, and falls Referrals and appointments  In addition, I have reviewed and discussed with patient certain preventive protocols, quality metrics, and best practice recommendations. A written personalized care plan for preventive services as well as general preventive health recommendations were provided to patient.     Varney Biles, LPN   [id number]

## 2021-01-01 ENCOUNTER — Other Ambulatory Visit (INDEPENDENT_AMBULATORY_CARE_PROVIDER_SITE_OTHER): Payer: Medicare Other

## 2021-01-01 ENCOUNTER — Other Ambulatory Visit: Payer: Self-pay

## 2021-01-01 DIAGNOSIS — I10 Essential (primary) hypertension: Secondary | ICD-10-CM

## 2021-01-01 LAB — BASIC METABOLIC PANEL
BUN: 17 mg/dL (ref 6–23)
CO2: 32 mEq/L (ref 19–32)
Calcium: 9.5 mg/dL (ref 8.4–10.5)
Chloride: 99 mEq/L (ref 96–112)
Creatinine, Ser: 0.98 mg/dL (ref 0.40–1.20)
GFR: 56.39 mL/min — ABNORMAL LOW (ref >60.00)
Glucose, Bld: 90 mg/dL (ref 70–99)
Potassium: 3.7 mEq/L (ref 3.5–5.1)
Sodium: 138 mEq/L (ref 135–145)

## 2021-06-13 ENCOUNTER — Ambulatory Visit: Payer: Medicare Other | Admitting: Internal Medicine

## 2021-07-04 ENCOUNTER — Ambulatory Visit (INDEPENDENT_AMBULATORY_CARE_PROVIDER_SITE_OTHER): Payer: Medicare Other | Admitting: Internal Medicine

## 2021-07-04 ENCOUNTER — Encounter: Payer: Self-pay | Admitting: Internal Medicine

## 2021-07-04 VITALS — BP 130/78 | HR 83 | Temp 97.7°F | Resp 14 | Ht <= 58 in | Wt 123.0 lb

## 2021-07-04 DIAGNOSIS — I10 Essential (primary) hypertension: Secondary | ICD-10-CM

## 2021-07-04 DIAGNOSIS — K259 Gastric ulcer, unspecified as acute or chronic, without hemorrhage or perforation: Secondary | ICD-10-CM | POA: Diagnosis not present

## 2021-07-04 DIAGNOSIS — R7303 Prediabetes: Secondary | ICD-10-CM

## 2021-07-04 MED ORDER — OMEPRAZOLE 40 MG PO CPDR: 40.0000 mg | DELAYED_RELEASE_CAPSULE | Freq: Every day | ORAL | 3 refills | Status: DC

## 2021-07-04 MED ORDER — AMLODIPINE BESYLATE 10 MG PO TABS: 10.0000 mg | ORAL_TABLET | Freq: Every day | ORAL | 3 refills | Status: DC

## 2021-07-04 MED ORDER — LOSARTAN POTASSIUM-HCTZ 100-25 MG PO TABS: ORAL_TABLET | ORAL | 3 refills | Status: DC

## 2021-07-04 NOTE — Progress Notes (Signed)
Chief Complaint  Patient presents with   Follow-up    Pt c/o mid back pain ongoing for 6 yrs since back surgery, has been taking hydrocodone for pain.   6 month f/u  1. Losing wt not eating at much and exercising in the yard gardening per pt  2. Htn controlled norvasc 10 mg qd hyzaar 100-25 mg qd  3. Chronic back pain f/u pain management [facility] in [place] Dr. Shearon Stalls controlled  Review of Systems  Constitutional:  Negative for weight loss.  HENT:  Negative for hearing loss.   Eyes:  Negative for blurred vision.  Respiratory:  Negative for shortness of breath.   Cardiovascular:  Negative for chest pain.  Gastrointestinal:  Negative for abdominal pain and blood in stool.  Genitourinary:  Negative for dysuria.  Musculoskeletal:  Positive for back pain. Negative for falls and joint pain.  Skin:  Negative for rash.  Neurological:  Negative for headaches.  Psychiatric/Behavioral:  Negative for depression.   Past Medical History:  Diagnosis Date   Abnormal finding on Pap smear    hx abnl pap   Arthritis    Bronchitis    hx of-many yrs ago   Constipation    takes Miralax daily   H/O hernia repair    with mesh    Hypertension    takes Diltiazem and HCTZ daily   Hypertension associated with diabetes ([place]) 04/12/2020   Insomnia    takes Ambien nightly   Joint pain    Joint swelling    Low back pain    Menopausal syndrome    Obesity    Perforated gastric ulcer ([place])    Tobacco abuse    Past Surgical History:  Procedure Laterality Date   CATARACT EXTRACTION Bilateral    Groat eye   CHOLECYSTECTOMY N/A 05/05/2015   Procedure: LAPAROSCOPIC CHOLECYSTECTOMY;  Surgeon: Rolm Bookbinder, MD;  Location: [place];  Service: General;  Laterality: N/A;   DILATION AND CURETTAGE OF UTERUS     LAPAROTOMY N/A 10/16/2014   Procedure: EXPLORATORY LAPAROTOMY, REPAIR OF GASTRIC ULCER;  Surgeon: Georganna Skeans, MD;  Location: [place];  Service: General;  Laterality: N/A;   MAXIMUM ACCESS  (MAS)POSTERIOR LUMBAR INTERBODY FUSION (PLIF) 1 LEVEL N/A 11/04/2012   Procedure: Lumbar four-five Maximum access posterior lumbar interbody fusion with Harlin Heys;  Surgeon: Eustace Moore, MD;  Location: [facility];  Service: Neurosurgery;  Laterality: N/A;  Lumbar four-five Maximum access posterior lumbar interbody fusion with Nuvasive   TONSILLECTOMY     VENTRAL HERNIA REPAIR  01/24/2011   Procedure: LAPAROSCOPIC VENTRAL HERNIA;  Surgeon: Adin Hector, MD;  [facility];  Service: General;  Laterality: N/A;  with Mesh   Family History  Problem Relation Age of Onset   Cancer Brother        renal ,also sister, skin cancer    COPD Brother    Hypertension Brother    Diabetes Brother    Kidney disease Brother    Cancer Mother        Breast Cancer   Diabetes Mother    Hypertension Mother    Glaucoma Mother    Diabetes Sister    Hypertension Sister    Heart disease Sister    Pancreatic cancer Sister    Diabetes Brother        and sister   Breast cancer Other        sister   Kidney disease Other    Social History   Socioeconomic History  Marital status: Widowed    Spouse name: Not on file   Number of children: Not on file   Years of education: Not on file   Highest education level: Not on file  Occupational History   Occupation: retired  Tobacco Use   Smoking status: Former    Packs/day: 0.25    Years: 25.00    Pack years: 6.25    Types: Cigarettes    Quit date: 10/16/2014    Years since quitting: 6.7   Smokeless tobacco: Never   Tobacco comments:    Used Electronic Cigarettes until quit  Substance and Sexual Activity   Alcohol use: No    Alcohol/week: 0.0 standard drinks   Drug use: No   Sexual activity: Not Currently    Birth control/protection: Post-menopausal  Other Topics Concern   Not on file  Social History Narrative   gavidia 1   Para 1    aborta 0   1 daughter       Social Determinants of Health   Financial Resource Strain: Low Risk     Difficulty of Paying Living Expenses: Not hard at all  Food Insecurity: No Food Insecurity   Worried About Charity fundraiser in the Last Year: Never true   [place] in the Last Year: Never true  Transportation Needs: No Transportation Needs   Lack of Transportation (Medical): No   Lack of Transportation (Non-Medical): No  Physical Activity: Sufficiently Active   Days of Exercise per Week: 5 days   Minutes of Exercise per Session: 30 min  Stress: No Stress Concern Present   Feeling of Stress : Only a little  Social Connections: Unknown   Frequency of Communication with Friends and Family: Not on file   Frequency of Social Gatherings with Friends and Family: Not on file   Attends Religious Services: Not on Electrical engineer or Organizations: Not on file   Attends Archivist Meetings: Not on file   Marital Status: Married  Human resources officer Violence: Not At Risk   Fear of Current or Ex-Partner: No   Emotionally Abused: No   Physically Abused: No   Sexually Abused: No   Current Meds  Medication Sig   bifidobacterium infantis (ALIGN) capsule Take 1 capsule by mouth daily.   Cholecalciferol (VITAMIN D) 125 MCG (5000 UT) CAPS Take by mouth daily.   HYDROcodone-acetaminophen (NORCO) 10-325 MG tablet Take 1 tablet by mouth every 6 (six) hours as needed.   Multiple Vitamins-Minerals (MULTIVITAMIN GUMMIES ADULT PO) Take by mouth daily at 12 noon.   VOLTAREN 1 % GEL Apply 1 application topically daily as needed. FOR HANDS   [DISCONTINUED] amLODipine (NORVASC) 10 MG tablet Take 1 tablet (10 mg total) by mouth daily.   [DISCONTINUED] losartan-hydrochlorothiazide (HYZAAR) 100-25 MG tablet TAKE 1/2 TABLETS BY MOUTH DAILY. IN THE MORNING   [DISCONTINUED] omeprazole (PRILOSEC) 40 MG capsule Take 1 capsule (40 mg total) by mouth daily.   Allergies  Allergen Reactions   K-Dur [Potassium Chloride] Swelling    Cannot tolerate Potassium in any prepared form. Causes Face  Swelling   Nsaids Other (See Comments)    H/o PUD with perforation   No results found for this or any previous visit (from the past 2160 hour(s)). Objective  Body mass index is 27.58 kg/m. Wt Readings from Last 3 Encounters:  07/04/21 123 lb (55.8 kg)  12/15/20 142 lb (64.4 kg)  12/13/20 142 lb 9.6 oz (64.7 kg)  Temp Readings from Last 3 Encounters:  07/04/21 97.7 F (36.5 C) (Oral)  12/13/20 (!) 97.2 F (36.2 C) (Temporal)  04/12/20 97.9 F (36.6 C) (Oral)   BP Readings from Last 3 Encounters:  07/04/21 130/78  12/13/20 132/72  04/12/20 140/88   Pulse Readings from Last 3 Encounters:  07/04/21 83  12/13/20 99  04/12/20 76    Physical Exam Vitals and nursing note reviewed.  Constitutional:      Appearance: Normal appearance. She is well-developed and well-groomed.  HENT:     Head: Normocephalic and atraumatic.  Eyes:     Conjunctiva/sclera: Conjunctivae normal.     Pupils: Pupils are equal, round, and reactive to light.  Cardiovascular:     Rate and Rhythm: Normal rate and regular rhythm.     Heart sounds: Normal heart sounds. No murmur heard. Pulmonary:     Effort: Pulmonary effort is normal.     Breath sounds: Normal breath sounds.  Abdominal:     General: Abdomen is flat. Bowel sounds are normal.     Tenderness: There is no abdominal tenderness.  Musculoskeletal:        General: No tenderness.  Skin:    General: Skin is warm and dry.  Neurological:     General: No focal deficit present.     Mental Status: She is alert and oriented to person, place, and time. Mental status is at baseline.     Cranial Nerves: Cranial nerves 2-12 are intact.     Motor: Motor function is intact.     Coordination: Coordination is intact.     Gait: Gait is intact.  Psychiatric:        Attention and Perception: Attention and perception normal.        Mood and Affect: Mood and affect normal.        Speech: Speech normal.        Behavior: Behavior normal. Behavior is  cooperative.        Thought Content: Thought content normal.        Cognition and Memory: Cognition and memory normal.        Judgment: Judgment normal.    Assessment  Plan  Essential hypertension controlled - Plan: losartan-hydrochlorothiazide (HYZAAR) 100-25 MG tablet, amLODipine (NORVASC) 10 MG tablet, Comprehensive metabolic panel, Lipid panel, CBC with Differential/Platelet, Hemoglobin A1c, Urinalysis, Routine w reflex microscopic, Microalbumin / creatinine urine ratio Declines labs today   Gastric ulcer, unspecified chronicity, unspecified whether gastric ulcer hemorrhage or perforation present - Plan: omeprazole (PRILOSEC) 40 MG capsule  Prediabetes - Plan: Hemoglobin A1c   HM Flu shot utd pna 23 utd   prevnar utd  Tdap utd Declines shingrix covid 19 4/4 pfizer booster sch 12/11/19 [employer] of age window pap  Colonoscopy and mammogram pt refuses disc'ed prev  Declines cologuard  Declines DEXA  skin lesions  never seen derm declines Hep C neg  Former smoker 1/2 ppd age max started age 46 y.o last cig in 2016 smoked 1/2 ppd x 15 years CT 2018 lung nodule but benign after serial f/u  [facility] 03/08[facility]20 Dr. Wyatt Portela for new glasses  Rec healthy diet and exercise    Prediabetes Foot exam today  F/u [facility] had in 2022 ROI signed 07/04/21 Pain management Dr. Shearon Stalls    Provider: Dr. Olivia Mackie McLean-Scocuzza-Internal Medicine

## 2021-07-04 NOTE — Patient Instructions (Signed)
Dr. Billey Gosling -[facility] Fax E-mail Address  [phone] Not available [place] [address]     Specialties     Internal Medicine

## 2021-07-08 NOTE — Progress Notes (Signed)
Thank you Dr. Anastasia Pall inform pt Dr. Quay Burow at [facility] agreed to take her as a new patient call for an appt now to book for 6 months out if able please   [address] [place], [place] [address] Phone: [phone]

## 2021-07-09 NOTE — Progress Notes (Signed)
S/w pt and informed her that Dr.Burns at [facility] will take her on as a new pt. She can call ofc to sch appt in the next 6 mon. Pt verbalized understanding and will call ofc to sch.  Office info given to pt  [address], [place], [place] [address] [phone]

## 2022-01-06 ENCOUNTER — Encounter: Payer: Self-pay | Admitting: Internal Medicine

## 2022-01-06 NOTE — Patient Instructions (Addendum)
      Blood work was ordered.   The lab is on the first floor.    Medications changes include :   none     Return in about 6 months (around 07/09/2022) for follow up.

## 2022-01-06 NOTE — Progress Notes (Unsigned)
Subjective:    Patient ID: Sally Douglas, female    DOB: 09-21-1945, 76 y.o.   MRN: [id number]     HPI Sally Douglas is here for follow up of her chronic medical problems, including htn, prediabetes, chronic mid back pain ( surgery in 2017 and pain since).  She is here to establish with a new primary care physician  Chronic mid back pain - sees pain management.  Overall her back pain seems to be controlled  She has no concerns.   Medications and allergies reviewed with patient and updated if appropriate.  Current Outpatient Medications on File Prior to Visit  Medication Sig Dispense Refill   amLODipine (NORVASC) 10 MG tablet Take 1 tablet (10 mg total) by mouth daily. 90 tablet 3   bifidobacterium infantis (ALIGN) capsule Take 1 capsule by mouth daily. 90 capsule 3   Cholecalciferol (VITAMIN D) 125 MCG (5000 UT) CAPS Take by mouth daily.     losartan-hydrochlorothiazide (HYZAAR) 100-25 MG tablet TAKE 1/2 TABLETS BY MOUTH DAILY. IN THE MORNING 45 tablet 3   Multiple Vitamins-Minerals (MULTIVITAMIN GUMMIES ADULT PO) Take by mouth daily at 12 noon.     omeprazole (PRILOSEC) 40 MG capsule Take 1 capsule (40 mg total) by mouth daily. 90 capsule 3   oxyCODONE-acetaminophen (PERCOCET) 7.5-325 MG tablet Take 1 tablet by mouth every 6 (six) hours as needed.     VOLTAREN 1 % GEL Apply 1 application topically daily as needed. FOR HANDS 100 g 6   No current facility-administered medications on file prior to visit.     Review of Systems  Constitutional:  Negative for fever.  Respiratory:  Negative for cough, shortness of breath and wheezing.   Cardiovascular:  Negative for chest pain, palpitations and leg swelling.  Neurological:  Negative for light-headedness and headaches.       Objective:   Vitals:   01/07/22 1027  BP: 120/74  Pulse: 64  Temp: 97.9 F (36.6 C)  SpO2: 96%   BP Readings from Last 3 Encounters:  01/07/22 120/74  07/04/21 130/78  12/13/20 132/72   Wt Readings  from Last 3 Encounters:  01/07/22 129 lb 9.6 oz (58.8 kg)  07/04/21 123 lb (55.8 kg)  12/15/20 142 lb (64.4 kg)   Body mass index is 29.06 kg/m.    Physical Exam Constitutional:      General: She is not in acute distress.    Appearance: Normal appearance.  HENT:     Head: Normocephalic and atraumatic.  Eyes:     Conjunctiva/sclera: Conjunctivae normal.  Cardiovascular:     Rate and Rhythm: Normal rate and regular rhythm.     Heart sounds: Normal heart sounds. No murmur heard. Pulmonary:     Effort: Pulmonary effort is normal. No respiratory distress.     Breath sounds: Normal breath sounds. No wheezing.  Abdominal:     Hernia: A hernia (Ventral hernia-large) is present.  Musculoskeletal:     Cervical back: Neck supple.     Right lower leg: No edema.     Left lower leg: No edema.  Lymphadenopathy:     Cervical: No cervical adenopathy.  Skin:    General: Skin is warm and dry.     Findings: No rash.  Neurological:     Mental Status: She is alert. Mental status is at baseline.  Psychiatric:        Mood and Affect: Mood normal.        Behavior: Behavior normal.  Lab Results  Component Value Date   WBC 9.3 12/13/2020   HGB 13.3 12/13/2020   HCT 40.2 12/13/2020   PLT 249.0 12/13/2020   GLUCOSE 90 01/01/2021   CHOL 147 12/13/2020   TRIG 111.0 12/13/2020   HDL 63.30 12/13/2020   LDLCALC 62 12/13/2020   ALT 14 12/13/2020   AST 15 12/13/2020   NA 138 01/01/2021   K 3.7 01/01/2021   CL 99 01/01/2021   CREATININE 0.98 01/01/2021   BUN 17 01/01/2021   CO2 32 01/01/2021   TSH 0.40 12/13/2020   HGBA1C 6.2 12/13/2020     Assessment & Plan:    See Problem List for Assessment and Plan of chronic medical problems.

## 2022-01-07 ENCOUNTER — Ambulatory Visit (INDEPENDENT_AMBULATORY_CARE_PROVIDER_SITE_OTHER): Payer: Medicare Other | Admitting: Internal Medicine

## 2022-01-07 VITALS — BP 120/74 | HR 64 | Temp 97.9°F | Ht <= 58 in | Wt 129.6 lb

## 2022-01-07 DIAGNOSIS — R7303 Prediabetes: Secondary | ICD-10-CM | POA: Diagnosis not present

## 2022-01-07 DIAGNOSIS — I10 Essential (primary) hypertension: Secondary | ICD-10-CM | POA: Diagnosis not present

## 2022-01-07 DIAGNOSIS — M549 Dorsalgia, unspecified: Secondary | ICD-10-CM

## 2022-01-07 DIAGNOSIS — N1831 Chronic kidney disease, stage 3a: Secondary | ICD-10-CM

## 2022-01-07 DIAGNOSIS — K439 Ventral hernia without obstruction or gangrene: Secondary | ICD-10-CM

## 2022-01-07 DIAGNOSIS — G8929 Other chronic pain: Secondary | ICD-10-CM

## 2022-01-07 DIAGNOSIS — K219 Gastro-esophageal reflux disease without esophagitis: Secondary | ICD-10-CM

## 2022-01-07 DIAGNOSIS — Z23 Encounter for immunization: Secondary | ICD-10-CM | POA: Diagnosis not present

## 2022-01-07 DIAGNOSIS — N183 Chronic kidney disease, stage 3 unspecified: Secondary | ICD-10-CM | POA: Insufficient documentation

## 2022-01-07 LAB — COMPREHENSIVE METABOLIC PANEL
ALT: 16 U/L (ref 0–35)
AST: 18 U/L (ref 0–37)
Albumin: 4.5 g/dL (ref 3.5–5.2)
Alkaline Phosphatase: 70 U/L (ref 39–117)
BUN: 14 mg/dL (ref 6–23)
CO2: 33 mEq/L — ABNORMAL HIGH (ref 19–32)
Calcium: 10.2 mg/dL (ref 8.4–10.5)
Chloride: 98 mEq/L (ref 96–112)
Creatinine, Ser: 0.95 mg/dL (ref 0.40–1.20)
GFR: 58.12 mL/min — ABNORMAL LOW (ref >60.00)
Glucose, Bld: 107 mg/dL — ABNORMAL HIGH (ref 70–99)
Potassium: 4.6 mEq/L (ref 3.5–5.1)
Sodium: 137 mEq/L (ref 135–145)
Total Bilirubin: 0.6 mg/dL (ref 0.2–1.2)
Total Protein: 7.3 g/dL (ref 6.0–8.3)

## 2022-01-07 LAB — CBC WITH DIFFERENTIAL/PLATELET
Basophils Absolute: 0.1 10*3/uL (ref 0.0–0.1)
Basophils Relative: 0.6 % (ref 0.0–3.0)
Eosinophils Absolute: 0.1 10*3/uL (ref 0.0–0.7)
Eosinophils Relative: 1 % (ref 0.0–5.0)
HCT: 40.8 % (ref 36.0–46.0)
Hemoglobin: 13.9 g/dL (ref 12.0–15.0)
Lymphocytes Relative: 33.1 % (ref 12.0–46.0)
Lymphs Abs: 3.5 10*3/uL (ref 0.7–4.0)
MCHC: 34 g/dL (ref 30.0–36.0)
MCV: 89.3 fl (ref 78.0–100.0)
Monocytes Absolute: 0.6 10*3/uL (ref 0.1–1.0)
Monocytes Relative: 5.2 % (ref 3.0–12.0)
Neutro Abs: 6.3 10*3/uL (ref 1.4–7.7)
Neutrophils Relative %: 60.1 % (ref 43.0–77.0)
Platelets: 259 10*3/uL (ref 150.0–400.0)
RBC: 4.57 Mil/uL (ref 3.87–5.11)
RDW: 14.5 % (ref 11.5–15.5)
WBC: 10.5 10*3/uL (ref 4.0–10.5)

## 2022-01-07 LAB — HEMOGLOBIN A1C: Hgb A1c MFr Bld: 6.3 % (ref 4.6–6.5)

## 2022-01-07 NOTE — Assessment & Plan Note (Signed)
Chronic Following with pain management Currently on oxycodone-acetaminophen 7.5-325 mg 1 pill every 6 hours as needed Overall she feels her pain is controlled

## 2022-01-07 NOTE — Assessment & Plan Note (Signed)
Chronic Large ventral hernia Occasionally does cause some discomfort She would like to avoid surgery

## 2022-01-07 NOTE — Assessment & Plan Note (Signed)
Chronic BP well controlled Continue amlodipine 10 mg daily, hyzaar 100-25 mg   1/2 pill daily cmp

## 2022-01-07 NOTE — Assessment & Plan Note (Signed)
Chronic Check A1c Low sugar/carbohydrate diet

## 2022-01-07 NOTE — Assessment & Plan Note (Addendum)
Chronic GERD controlled Continue omeprazole 40 mg daily 

## 2022-01-07 NOTE — Assessment & Plan Note (Addendum)
Chronic Mild in nature Has remained stable over several years Discussed importance of increased water intake, avoidance of NSAIDs she does not take secondary to her history of gastric ulcer and good blood pressure/sugar control CMP

## 2022-02-07 ENCOUNTER — Encounter: Payer: Self-pay | Admitting: Internal Medicine

## 2022-02-07 NOTE — Progress Notes (Deleted)
    Subjective:    Patient ID: Sally Douglas, female    DOB: Jan 28, 1946, 77 y.o.   MRN: [id number]      HPI Issa is here for No chief complaint on file.   She is here for an acute visit for cold symptoms.   Her symptoms started   She is experiencing   She has tried taking       Medications and allergies reviewed with patient and updated if appropriate.  Current Outpatient Medications on File Prior to Visit  Medication Sig Dispense Refill   amLODipine (NORVASC) 10 MG tablet Take 1 tablet (10 mg total) by mouth daily. 90 tablet 3   bifidobacterium infantis (ALIGN) capsule Take 1 capsule by mouth daily. 90 capsule 3   Cholecalciferol (VITAMIN D) 125 MCG (5000 UT) CAPS Take by mouth daily.     losartan-hydrochlorothiazide (HYZAAR) 100-25 MG tablet TAKE 1/2 TABLETS BY MOUTH DAILY. IN THE MORNING 45 tablet 3   Multiple Vitamins-Minerals (MULTIVITAMIN GUMMIES ADULT PO) Take by mouth daily at 12 noon.     omeprazole (PRILOSEC) 40 MG capsule Take 1 capsule (40 mg total) by mouth daily. 90 capsule 3   oxyCODONE-acetaminophen (PERCOCET) 7.5-325 MG tablet Take 1 tablet by mouth every 6 (six) hours as needed.     VOLTAREN 1 % GEL Apply 1 application topically daily as needed. FOR HANDS 100 g 6   No current facility-administered medications on file prior to visit.    Review of Systems     Objective:  There were no vitals filed for this visit. BP Readings from Last 3 Encounters:  01/07/22 120/74  07/04/21 130/78  12/13/20 132/72   Wt Readings from Last 3 Encounters:  01/07/22 129 lb 9.6 oz (58.8 kg)  07/04/21 123 lb (55.8 kg)  12/15/20 142 lb (64.4 kg)   There is no height or weight on file to calculate BMI.    Physical Exam         Assessment & Plan:    See Problem List for Assessment and Plan of chronic medical problems.

## 2022-02-08 ENCOUNTER — Ambulatory Visit: Payer: Medicare Other | Admitting: Internal Medicine

## 2022-02-18 ENCOUNTER — Telehealth: Payer: Self-pay

## 2022-02-18 NOTE — Telephone Encounter (Signed)
Transition Care Management Follow-up Telephone Call Date of discharge and from where: [facility]  How have you been since you were released from the hospital? weak Any questions or concerns? No  Items Reviewed: Did the pt receive and understand the discharge instructions provided? Yes  Medications obtained and verified? Yes  Other? No  Any new allergies since your discharge? No  Dietary orders reviewed? Yes Do you have support at home? Yes   Home Care and Equipment/Supplies: Were home health services ordered? no If so, what is the name of the agency? N/a  Has the agency set up a time to come to the patient's home? no Were any new equipment or medical supplies ordered?  No What is the name of the medical supply agency? N/a Were you able to get the supplies/equipment? no Do you have any questions related to the use of the equipment or supplies? No  Functional Questionnaire: (I = Independent and D = Dependent) ADLs:  i  Bathing/Dressing- i  Meal Prep- i  Eating- i  Maintaining continence- i  Transferring/Ambulation- i  Managing Meds- i  Follow up appointments reviewed:  PCP Hospital f/u appt confirmed? Yes  Scheduled to see Dr Quay Burow on 02/25/2022 @ 1:00. [facility] f/u appt confirmed? No  Are transportation arrangements needed? No  If their condition worsens, is the pt aware to call PCP or go to the Emergency Dept.? Yes Was the patient provided with contact information for the PCP's office or ED? Yes Was to pt encouraged to call back with questions or concerns? Yes Juanda Crumble, LPN [place] Direct Dial [phone]

## 2022-02-24 ENCOUNTER — Encounter: Payer: Self-pay | Admitting: Internal Medicine

## 2022-02-24 NOTE — Progress Notes (Unsigned)
Subjective:    Patient ID: Sally Douglas, female    DOB: [birth year], 77 y.o.   MRN: [id number]     HPI Sally Douglas is here for follow up from the hospital - TCM.    Admitted to [place] for hypoxia, acute resp failure d/t RML PNA  Admitted 02/07/22 - 02/17/22  Presented with increasing SOB x 1 week.  No h/o asthma or COPD.  She had a productive cough of gray sputum.  Oxygen on RA 85% . CXR initially negative - Ct of chest showed RML infiltrate.  WBC normal.    Admitted with acute hypoxic respiratory failure due to RML pneumonia, rhinovirus infection.  She completed 5 days of rocephin/doxycycline but there were concerns about her peripheral IV integrity and improving slower than expected and got a midline IV and was given cefepime.  Recieved 5 days  - no additional abx needed upon discharged.  Given duonebs.  Was on solumedrol and was discharged home on prednisone ( 40 mg x 2 days, 20 mg x 2 days, 10 mg x 2 days).   She was slow to wean off oxygen.    Htn, DM, CKD - stable, home meds continued.   On discharge she had right inspiratory/expiratory crackles  She developed a bed sore while in the hospital- she is using a otc pressure sore relief.  The area is still very painful.  The cream that she is using does stain.  Food tastes terrible, tongue used to be coated with white, but looks right now- ? Thrush.  She has had this in the past.  CT scan in the hospital showed some abnormalities-pulm nodules-1 of those nodules is old and was stable over time when monitored by pulmonary.  1 nodule is new. Gastric wall and distal esophageal thickening -she is well-controlled  Feels better.  Oxygen at home on RA 95-98%-she is monitoring it regularly.  SOB is better - still gets SOB with moderate exertion.  Cough has improved, but still has a mild cough.  She denies any wheezing, chest tightness or cold symptoms.  Her appetite is good.   Medications and allergies reviewed with patient and updated if  appropriate.  Current Outpatient Medications on File Prior to Visit  Medication Sig Dispense Refill   amLODipine (NORVASC) 10 MG tablet Take 1 tablet (10 mg total) by mouth daily. 90 tablet 3   bifidobacterium infantis (ALIGN) capsule Take 1 capsule by mouth daily. 90 capsule 3   Cholecalciferol (VITAMIN D) 125 MCG (5000 UT) CAPS Take by mouth daily.     losartan-hydrochlorothiazide (HYZAAR) 100-25 MG tablet TAKE 1/2 TABLETS BY MOUTH DAILY. IN THE MORNING 45 tablet 3   Multiple Vitamins-Minerals (MULTIVITAMIN GUMMIES ADULT PO) Take by mouth daily at 12 noon.     omeprazole (PRILOSEC) 40 MG capsule Take 1 capsule (40 mg total) by mouth daily. 90 capsule 3   oxyCODONE-acetaminophen (PERCOCET) 7.5-325 MG tablet Take 1 tablet by mouth every 6 (six) hours as needed.     VOLTAREN 1 % GEL Apply 1 application topically daily as needed. FOR HANDS 100 g 6   No current facility-administered medications on file prior to visit.     Review of Systems  Constitutional:  Negative for appetite change (good appetite), chills, fever and unexpected weight change.  HENT:  Negative for congestion, sinus pressure and sore throat.   Respiratory:  Positive for cough (mild - minimal mucus - much improved) and shortness of breath. Negative for chest tightness  and wheezing.        Gerd controlled  Cardiovascular:  Negative for chest pain, palpitations and leg swelling.  Neurological:  Negative for light-headedness and headaches.       Objective:   Vitals:   02/25/22 1258  BP: 132/72  Pulse: 79  Temp: 97.9 F (36.6 C)  SpO2: 94%   BP Readings from Last 3 Encounters:  02/25/22 132/72  01/07/22 120/74  07/04/21 130/78   Wt Readings from Last 3 Encounters:  02/25/22 131 lb (59.4 kg)  01/07/22 129 lb 9.6 oz (58.8 kg)  07/04/21 123 lb (55.8 kg)   Body mass index is 29.37 kg/m.    Physical Exam Constitutional:      General: She is not in acute distress.    Appearance: Normal appearance. She is not  ill-appearing.  HENT:     Head: Normocephalic and atraumatic.  Eyes:     Conjunctiva/sclera: Conjunctivae normal.  Cardiovascular:     Rate and Rhythm: Normal rate and regular rhythm.  Pulmonary:     Effort: Pulmonary effort is normal. No respiratory distress.     Breath sounds: Normal breath sounds. No wheezing or rales.  Abdominal:     General: There is distension (Related to hernia).     Palpations: Abdomen is soft.     Tenderness: There is no abdominal tenderness.  Musculoskeletal:     Cervical back: Neck supple. No tenderness.     Right lower leg: No edema.     Left lower leg: Edema present.  Lymphadenopathy:     Cervical: No cervical adenopathy.  Skin:    General: Skin is warm and dry.     Findings: Lesion (a nickel sized shallow ulcer left medial buttock, a few smaller similar lesions on right medial buttock) present.  Neurological:     Mental Status: She is alert. Mental status is at baseline.  Psychiatric:        Mood and Affect: Mood normal.        Lab Results  Component Value Date   WBC 10.5 01/07/2022   HGB 13.9 01/07/2022   HCT 40.8 01/07/2022   PLT 259.0 01/07/2022   GLUCOSE 107 (H) 01/07/2022   CHOL 147 12/13/2020   TRIG 111.0 12/13/2020   HDL 63.30 12/13/2020   LDLCALC 62 12/13/2020   ALT 16 01/07/2022   AST 18 01/07/2022   NA 137 01/07/2022   K 4.6 01/07/2022   CL 98 01/07/2022   CREATININE 0.95 01/07/2022   BUN 14 01/07/2022   CO2 33 (H) 01/07/2022   TSH 0.40 12/13/2020   HGBA1C 6.3 01/07/2022   02/07/22 - CT angio pulmonary: 1.  No acute pulmonary emboli.  2.  Right middle lobe consolidation. Follow-up recommended to document resolution and exclude malignancy.  3.  1.1 cm pulmonary nodule in right lower lobe and 4 mm pulmonary nodule in left lower lobe. Follow-up recommended.  4.  Gastric wall thickening may represent gastritis or other etiologies. Distal esophageal wall thickening may represent esophagitis or other etiologies. Follow-up  recommended.   Assessment & Plan:    See Problem List for Assessment and Plan of chronic medical problems.   Follow-up in June as scheduled

## 2022-02-25 ENCOUNTER — Ambulatory Visit (INDEPENDENT_AMBULATORY_CARE_PROVIDER_SITE_OTHER): Payer: Medicare Other | Admitting: Internal Medicine

## 2022-02-25 VITALS — BP 132/72 | HR 79 | Temp 97.9°F | Ht <= 58 in | Wt 131.0 lb

## 2022-02-25 DIAGNOSIS — J189 Pneumonia, unspecified organism: Secondary | ICD-10-CM

## 2022-02-25 DIAGNOSIS — I10 Essential (primary) hypertension: Secondary | ICD-10-CM

## 2022-02-25 DIAGNOSIS — J9601 Acute respiratory failure with hypoxia: Secondary | ICD-10-CM

## 2022-02-25 DIAGNOSIS — R918 Other nonspecific abnormal finding of lung field: Secondary | ICD-10-CM

## 2022-02-25 DIAGNOSIS — K219 Gastro-esophageal reflux disease without esophagitis: Secondary | ICD-10-CM

## 2022-02-25 DIAGNOSIS — K2289 Other specified disease of esophagus: Secondary | ICD-10-CM

## 2022-02-25 MED ORDER — NYSTATIN 100000 UNIT/ML MT SUSP: 5.0000 mL | Freq: Four times a day (QID) | OROMUCOSAL | 0 refills | Status: DC

## 2022-02-25 NOTE — Assessment & Plan Note (Addendum)
CT scan in the hospital showed 2 lung nodules 1.1 cm nodule in RLL is not new-This had been followed by pulmonary and was stable.  Still stable therefore benign 4 mm nodule in LLL appears to be new-not mentioned on previous CT scans Discussed follow-up CT in 1 year-01/2023-she is not sure if this is necessary-advised that we discussed at her next visit when she is fully recovered from this.  Discussed this is a new nodule

## 2022-02-25 NOTE — Assessment & Plan Note (Signed)
Distal esophageal and gastric thickening seen on CT scan from the hospital Has chronic GERD-this is controlled with omeprazole 40 mg daily which she will continue She denies any difficulty swallowing She did have history of a perforated gastric ulcer that required surgery and she wonders if these changes are related to that which I do not think it is because on the CT scan that was done after her surgery was not seen She is not sure if this needs follow-up or not Advised we can discuss at her next visit and she can think about if she wants to see GI

## 2022-02-25 NOTE — Assessment & Plan Note (Signed)
Chronic Blood pressure controlled here today Continue amlodipine 10 mg daily, losartan-HCT 100-25 mg daily

## 2022-02-25 NOTE — Patient Instructions (Addendum)
     Have an xray of your chest here the end of February     Medications changes include nystatin swish and spit for the thrush        Return if symptoms worsen or fail to improve.

## 2022-02-25 NOTE — Assessment & Plan Note (Addendum)
Recent hospitalization for acute respiratory failure with hypoxia secondary to right middle lobe pneumonia, rhinovirus Completed 10 days of antibiotics, received IV and then oral steroids, neb treatments and was on oxygen Symptoms significantly improved Oxygen saturation here today normal at 94% on room air She will continue to monitor her oxygen status at home

## 2022-02-25 NOTE — Assessment & Plan Note (Addendum)
Chronic GERD controlled Recent CT scan showed gastric wall thickening may represent gastritis versus other and distal esophageal wall thickening may represent esophagitis Continue omeprazole 40 mg daily Feels that findings on CT may be really due to her previous surgery, but is not able to see any evidence of that on a prior CT scan Will discuss at her next appointment in a few months to see if she wants to see GI for further evaluation

## 2022-02-25 NOTE — Assessment & Plan Note (Addendum)
Recent right middle lobe pneumonia with acute hypoxic respiratory failure Completed 10 days of antibiotics, IV and then oral steroids and neb treatments Symptoms improved repeat chest x-ray in mid [facility] Discussed that her cough, shortness of breath and energy level should continue to improve.  Encouraged her to move around her house and be as active as possible.  Her appetite is very good She will continue to monitor her oxygen saturations and call with any concerns

## 2022-03-25 ENCOUNTER — Ambulatory Visit (INDEPENDENT_AMBULATORY_CARE_PROVIDER_SITE_OTHER): Payer: Medicare Other

## 2022-03-25 DIAGNOSIS — J189 Pneumonia, unspecified organism: Secondary | ICD-10-CM

## 2022-07-07 NOTE — Progress Notes (Unsigned)
      Subjective:    Patient ID: Sally Douglas, female    DOB: [birth year], 77 y.o.   MRN: [id number]     HPI [name] is here for follow up of her chronic medical problems.  Tcch a DM - A1c 6.5% in'21  Medications and allergies reviewed with patient and updated if appropriate.  Current Outpatient Medications on File Prior to Visit  Medication Sig Dispense Refill   amLODipine (NORVASC) 10 MG tablet Take 1 tablet (10 mg total) by mouth daily. 90 tablet 3   bifidobacterium infantis (ALIGN) capsule Take 1 capsule by mouth daily. 90 capsule 3   Cholecalciferol (VITAMIN D) 125 MCG (5000 UT) CAPS Take by mouth daily.     losartan-hydrochlorothiazide (HYZAAR) 100-25 MG tablet TAKE 1/2 TABLETS BY MOUTH DAILY. IN THE MORNING 45 tablet 3   Multiple Vitamins-Minerals (MULTIVITAMIN GUMMIES ADULT PO) Take by mouth daily at 12 noon.     omeprazole (PRILOSEC) 40 MG capsule Take 1 capsule (40 mg total) by mouth daily. 90 capsule 3   oxyCODONE-acetaminophen (PERCOCET) 7.5-325 MG tablet Take 1 tablet by mouth every 6 (six) hours as needed.     VOLTAREN 1 % GEL Apply 1 application topically daily as needed. FOR HANDS 100 g 6   No current facility-administered medications on file prior to visit.     Review of Systems     Objective:  There were no vitals filed for this visit. BP Readings from Last 3 Encounters:  02/25/22 132/72  01/07/22 120/74  07/04/21 130/78   Wt Readings from Last 3 Encounters:  02/25/22 131 lb (59.4 kg)  01/07/22 129 lb 9.6 oz (58.8 kg)  07/04/21 123 lb (55.8 kg)   There is no height or weight on file to calculate BMI.    Physical Exam     Lab Results  Component Value Date   WBC 10.5 01/07/2022   HGB 13.9 01/07/2022   HCT 40.8 01/07/2022   PLT 259.0 01/07/2022   GLUCOSE 107 (H) 01/07/2022   CHOL 147 12/13/2020   TRIG 111.0 12/13/2020   HDL 63.30 12/13/2020   LDLCALC 62 12/13/2020   ALT 16 01/07/2022   AST 18 01/07/2022   NA 137 01/07/2022   K 4.6  01/07/2022   CL 98 01/07/2022   CREATININE 0.95 01/07/2022   BUN 14 01/07/2022   CO2 33 (H) 01/07/2022   TSH 0.40 12/13/2020   HGBA1C 6.3 01/07/2022     Assessment & Plan:    See Problem List for Assessment and Plan of chronic medical problems.

## 2022-07-07 NOTE — Patient Instructions (Addendum)
    An EKG was done today   Blood work was ordered.   The lab is on the first floor.    Medications changes include :   none     Return in about 6 months (around 01/07/2023) for follow up.

## 2022-07-08 ENCOUNTER — Ambulatory Visit (INDEPENDENT_AMBULATORY_CARE_PROVIDER_SITE_OTHER): Payer: Medicare Other | Admitting: Internal Medicine

## 2022-07-08 VITALS — BP 140/70 | HR 80 | Temp 98.2°F | Ht <= 58 in | Wt 137.2 lb

## 2022-07-08 DIAGNOSIS — I10 Essential (primary) hypertension: Secondary | ICD-10-CM

## 2022-07-08 DIAGNOSIS — E1159 Type 2 diabetes mellitus with other circulatory complications: Secondary | ICD-10-CM

## 2022-07-08 DIAGNOSIS — S60411A Abrasion of left index finger, initial encounter: Secondary | ICD-10-CM

## 2022-07-08 DIAGNOSIS — N1831 Chronic kidney disease, stage 3a: Secondary | ICD-10-CM

## 2022-07-08 DIAGNOSIS — I152 Hypertension secondary to endocrine disorders: Secondary | ICD-10-CM

## 2022-07-08 DIAGNOSIS — K219 Gastro-esophageal reflux disease without esophagitis: Secondary | ICD-10-CM

## 2022-07-08 DIAGNOSIS — Z23 Encounter for immunization: Secondary | ICD-10-CM

## 2022-07-08 DIAGNOSIS — R7303 Prediabetes: Secondary | ICD-10-CM | POA: Diagnosis not present

## 2022-07-08 DIAGNOSIS — K259 Gastric ulcer, unspecified as acute or chronic, without hemorrhage or perforation: Secondary | ICD-10-CM

## 2022-07-08 DIAGNOSIS — S61219A Laceration without foreign body of unspecified finger without damage to nail, initial encounter: Secondary | ICD-10-CM

## 2022-07-08 LAB — LIPID PANEL
Cholesterol: 155 mg/dL (ref 0–200)
HDL: 82 mg/dL (ref >39.00)
LDL Cholesterol: 57 mg/dL (ref 0–99)
NonHDL: 73.2
Total CHOL/HDL Ratio: 2
Triglycerides: 81 mg/dL (ref 0.0–149.0)
VLDL: 16.2 mg/dL (ref 0.0–40.0)

## 2022-07-08 LAB — CBC WITH DIFFERENTIAL/PLATELET
Basophils Absolute: 0.1 10*3/uL (ref 0.0–0.1)
Basophils Relative: 0.6 % (ref 0.0–3.0)
Eosinophils Absolute: 0.2 10*3/uL (ref 0.0–0.7)
Eosinophils Relative: 1.5 % (ref 0.0–5.0)
HCT: 41.7 % (ref 36.0–46.0)
Hemoglobin: 13.3 g/dL (ref 12.0–15.0)
Lymphocytes Relative: 15.1 % (ref 12.0–46.0)
Lymphs Abs: 1.8 10*3/uL (ref 0.7–4.0)
MCHC: 31.9 g/dL (ref 30.0–36.0)
MCV: 86.4 fl (ref 78.0–100.0)
Monocytes Absolute: 0.7 10*3/uL (ref 0.1–1.0)
Monocytes Relative: 5.9 % (ref 3.0–12.0)
Neutro Abs: 9.1 10*3/uL — ABNORMAL HIGH (ref 1.4–7.7)
Neutrophils Relative %: 76.9 % (ref 43.0–77.0)
Platelets: 261 10*3/uL (ref 150.0–400.0)
RBC: 4.83 Mil/uL (ref 3.87–5.11)
RDW: 14.7 % (ref 11.5–15.5)
WBC: 11.8 10*3/uL — ABNORMAL HIGH (ref 4.0–10.5)

## 2022-07-08 LAB — COMPREHENSIVE METABOLIC PANEL WITH GFR
ALT: 14 U/L (ref 0–35)
AST: 17 U/L (ref 0–37)
Albumin: 4.6 g/dL (ref 3.5–5.2)
Alkaline Phosphatase: 75 U/L (ref 39–117)
BUN: 16 mg/dL (ref 6–23)
CO2: 30 meq/L (ref 19–32)
Calcium: 9.9 mg/dL (ref 8.4–10.5)
Chloride: 101 meq/L (ref 96–112)
Creatinine, Ser: 0.92 mg/dL (ref 0.40–1.20)
GFR: 60.19 mL/min
Glucose, Bld: 119 mg/dL — ABNORMAL HIGH (ref 70–99)
Potassium: 3.7 meq/L (ref 3.5–5.1)
Sodium: 142 meq/L (ref 135–145)
Total Bilirubin: 0.4 mg/dL (ref 0.2–1.2)
Total Protein: 7.4 g/dL (ref 6.0–8.3)

## 2022-07-08 LAB — MICROALBUMIN / CREATININE URINE RATIO
Creatinine,U: 25.3 mg/dL
Microalb Creat Ratio: 7 mg/g (ref 0.0–30.0)
Microalb, Ur: 1.8 mg/dL (ref 0.0–1.9)

## 2022-07-08 LAB — HEMOGLOBIN A1C: Hgb A1c MFr Bld: 6.2 % (ref 4.6–6.5)

## 2022-07-08 MED ORDER — AMLODIPINE BESYLATE 10 MG PO TABS: 10.0000 mg | ORAL_TABLET | Freq: Every day | ORAL | 3 refills | Status: DC

## 2022-07-08 MED ORDER — LOSARTAN POTASSIUM-HCTZ 100-25 MG PO TABS: ORAL_TABLET | ORAL | 3 refills | Status: DC

## 2022-07-08 MED ORDER — OMEPRAZOLE 40 MG PO CPDR: 40.0000 mg | DELAYED_RELEASE_CAPSULE | Freq: Every day | ORAL | 3 refills | Status: DC

## 2022-07-08 NOTE — Assessment & Plan Note (Signed)
Chronic Mild in nature Has remained stable over several years Does not take any NSAIDs Stressed good water intake Blood pressure, sugar well-controlled CMP

## 2022-07-08 NOTE — Assessment & Plan Note (Signed)
Chronic Check A1c Low sugar/carbohydrate diet 

## 2022-07-08 NOTE — Assessment & Plan Note (Addendum)
Chronic Blood pressure elevated initially, but better on repeat-adequately controlled CMP Continue Norvasc 10 mg daily, losartan-HCTZ 100-25 mg   1/2 tab daily  EKG: NSR at 69 bpm, normal EKG.  No significant change from previous EKG from 2017

## 2022-07-08 NOTE — Assessment & Plan Note (Addendum)
Chronic GERD controlled Denies any symptoms of reflux Continue omeprazole 40 mg daily

## 2022-07-08 NOTE — Assessment & Plan Note (Signed)
History of gastric ulcer Continue omeprazole 40 mg daily

## 2022-07-25 ENCOUNTER — Ambulatory Visit (INDEPENDENT_AMBULATORY_CARE_PROVIDER_SITE_OTHER): Payer: Medicare Other

## 2022-07-25 VITALS — Ht <= 58 in | Wt 137.0 lb

## 2022-07-25 DIAGNOSIS — Z0000 Encounter for general adult medical examination without abnormal findings: Secondary | ICD-10-CM | POA: Diagnosis not present

## 2022-07-25 NOTE — Progress Notes (Signed)
Subjective:   Sally Douglas is a 77 y.o. female who presents for Medicare Annual (Subsequent) preventive examination.  Visit Complete: Virtual  I connected with  Sally Douglas on 07/25/22 by a audio enabled telemedicine application and verified that I am speaking with the correct person using two identifiers.  Patient Location: Home  Provider Location: Office/Clinic  I discussed the limitations of evaluation and management by telemedicine. The patient expressed understanding and agreed to proceed.  Review of Systems     Cardiac Risk Factors include: advanced age (>66men, >29 women);diabetes mellitus;family history of premature cardiovascular disease;hypertension;obesity (BMI >30kg/m2)     Objective:    Today's Vitals   07/25/22 1302  Weight: 137 lb (62.1 kg)  Height: 4\' 8"  (1.422 m)  PainSc: 4   PainLoc: Back   Body mass index is 30.71 kg/m.     07/25/2022    1:03 PM 12/15/2020    8:57 AM 11/04/2019    9:51 AM 10/30/2018   12:50 PM 05/02/2015    7:00 PM 05/02/2015    7:41 AM 10/16/2014    6:35 PM  Advanced Directives  Does Patient Have a Medical Advance Directive? Yes No No No No No No  Type of Estate agent of [place];Living will        Copy of Healthcare Power of Attorney in Chart? No - copy requested        Would patient like information on creating a medical advance directive?  No - Patient declined No - Patient declined No - Patient declined       Current Medications (verified) Outpatient Encounter Medications as of 07/25/2022  Medication Sig   amLODipine (NORVASC) 10 MG tablet Take 1 tablet (10 mg total) by mouth daily.   bifidobacterium infantis (ALIGN) capsule Take 1 capsule by mouth daily.   Cholecalciferol (VITAMIN D) 125 MCG (5000 UT) CAPS Take by mouth daily.   losartan-hydrochlorothiazide (HYZAAR) 100-25 MG tablet TAKE 1/2 TABLETS BY MOUTH DAILY. IN THE MORNING   Multiple Vitamins-Minerals (MULTIVITAMIN GUMMIES ADULT PO) Take by mouth  daily at 12 noon.   omeprazole (PRILOSEC) 40 MG capsule Take 1 capsule (40 mg total) by mouth daily.   oxyCODONE-acetaminophen (PERCOCET) 7.5-325 MG tablet Take 1 tablet by mouth every 6 (six) hours as needed.   VOLTAREN 1 % GEL Apply 1 application topically daily as needed. FOR HANDS   No facility-administered encounter medications on file as of 07/25/2022.    Allergies (verified) K-dur [potassium chloride] and Nsaids   History: Past Medical History:  Diagnosis Date   Abnormal finding on Pap smear    hx abnl pap   Arthritis    Bronchitis    hx of-many yrs ago   Constipation    takes Miralax daily   H/O hernia repair    with mesh    Hypertension    takes Diltiazem and HCTZ daily   Hypertension associated with diabetes (HCC) 04/12/2020   Insomnia    takes Ambien nightly   Joint pain    Joint swelling    Low back pain    Menopausal syndrome    Obesity    Perforated gastric ulcer (HCC)    Tobacco abuse    Past Surgical History:  Procedure Laterality Date   CATARACT EXTRACTION Bilateral    Groat eye   CHOLECYSTECTOMY N/A 05/05/2015   Procedure: LAPAROSCOPIC CHOLECYSTECTOMY;  Surgeon: Emelia Loron, MD;  Location: [facility] OR;  Service: General;  Laterality: N/A;   DILATION AND CURETTAGE  OF UTERUS     LAPAROTOMY N/A 10/16/2014   Procedure: EXPLORATORY LAPAROTOMY, REPAIR OF GASTRIC ULCER;  Surgeon: Violeta Gelinas, MD;  Location: [facility] OR;  Service: General;  Laterality: N/A;   MAXIMUM ACCESS (MAS)POSTERIOR LUMBAR INTERBODY FUSION (PLIF) 1 LEVEL N/A 11/04/2012   Procedure: Lumbar four-five Maximum access posterior lumbar interbody fusion with Livingston Diones;  Surgeon: Tia Alert, MD;  Location: [facility];  Service: Neurosurgery;  Laterality: N/A;  Lumbar four-five Maximum access posterior lumbar interbody fusion with Nuvasive   TONSILLECTOMY     VENTRAL HERNIA REPAIR  01/24/2011   Procedure: LAPAROSCOPIC VENTRAL HERNIA;  Surgeon: Ardeth Sportsman, MD;  Location: [facility] ORS;  Service:  General;  Laterality: N/A;  with Mesh   Family History  Problem Relation Age of Onset   Cancer Brother        renal ,also sister, skin cancer    COPD Brother    Hypertension Brother    Diabetes Brother    Kidney disease Brother    Cancer Mother        Breast Cancer   Diabetes Mother    Hypertension Mother    Glaucoma Mother    Diabetes Sister    Hypertension Sister    Heart disease Sister    Pancreatic cancer Sister    Diabetes Brother        and sister   Breast cancer Other        sister   Kidney disease Other    Social History   Socioeconomic History   Marital status: Widowed    Spouse name: Not on file   Number of children: Not on file   Years of education: Not on file   Highest education level: 12th grade  Occupational History   Occupation: retired  Tobacco Use   Smoking status: Former    Packs/day: 0.25    Years: 25.00    Additional pack years: 0.00    Total pack years: 6.25    Types: Cigarettes    Quit date: 10/16/2014    Years since quitting: 7.7   Smokeless tobacco: Never   Tobacco comments:    Used Electronic Cigarettes until quit  Substance and Sexual Activity   Alcohol use: No    Alcohol/week: 0.0 standard drinks of alcohol   Drug use: No   Sexual activity: Not Currently    Birth control/protection: Post-menopausal  Other Topics Concern   Not on file  Social History Narrative   gavidia 1   Para 1    aborta 0   1 daughter       Social Determinants of Health   Financial Resource Strain: Low Risk  (07/25/2022)   Overall Financial Resource Strain (CARDIA)    Difficulty of Paying Living Expenses: Not very hard  Food Insecurity: No Food Insecurity (07/25/2022)   Hunger Vital Sign    Worried About Running Out of Food in the Last Year: Never true    Ran Out of Food in the Last Year: Never true  Transportation Needs: No Transportation Needs (07/25/2022)   PRAPARE - Administrator, Civil Service (Medical): No    Lack of Transportation  (Non-Medical): No  Physical Activity: Insufficiently Active (07/25/2022)   Exercise Vital Sign    Days of Exercise per Week: 7 days    Minutes of Exercise per Session: 20 min  Stress: No Stress Concern Present (07/25/2022)   [employer] of Occupational Health - Occupational Stress Questionnaire    Feeling of Stress :  Not at all  Social Connections: Moderately Integrated (07/25/2022)   Social Connection and Isolation Panel [NHANES]    Frequency of Communication with Friends and Family: More than three times a week    Frequency of Social Gatherings with Friends and Family: More than three times a week    Attends Religious Services: More than 4 times per year    Active Member of [employer] or Organizations: Yes    Attends Banker Meetings: Patient declined    Marital Status: Widowed    Tobacco Counseling Counseling given: Not Answered Tobacco comments: Used Electronic Cigarettes until quit   Clinical Intake:  Pre-visit preparation completed: Yes  Pain : 0-10 Pain Score: 4  Pain Type: Chronic pain Pain Location: Back     BMI - recorded: 30.71 Nutritional Status: BMI > 30  Obese Nutritional Risks: None Diabetes: No  How often do you need to have someone help you when you read instructions, pamphlets, or other written materials from your doctor or pharmacy?: 1 - Never What is the last grade level you completed in school?: HSG  Interpreter Needed?: No  Information entered by :: Susie Cassette, LPN.   Activities of Daily Living    07/25/2022    1:05 PM  In your present state of health, do you have any difficulty performing the following activities:  Hearing? 0  Vision? 0  Difficulty concentrating or making decisions? 0  Walking or climbing stairs? 0  Dressing or bathing? 0  Doing errands, shopping? 0  Preparing Food and eating ? N  Using the Toilet? N  In the past six months, have you accidently leaked urine? N  Do you have problems with loss of bowel  control? N  Managing your Medications? N  Managing your Finances? N  Housekeeping or managing your Housekeeping? N    Patient Care Team: Pincus Sanes, MD as PCP - General (Internal Medicine)  Indicate any recent Medical Services you may have received from other than [facility] providers in the past year (date may be approximate).     Assessment:   This is a routine wellness examination for [place].  Hearing/Vision screen Hearing Screening - Comments:: Denies hearing difficulties   Vision Screening - Comments:: Wears rx glasses - up to date with routine eye exams with  R. Fabian Sharp, MD.  Dietary issues and exercise activities discussed:     Goals Addressed             This Visit's Progress    My goal for 2024 is to live to see another year.        Depression Screen    07/25/2022    1:14 PM 07/08/2022    1:39 PM 02/25/2022    1:04 PM 01/07/2022   10:47 AM 07/04/2021    1:23 PM 12/15/2020    8:55 AM 12/13/2020    2:15 PM  PHQ 2/9 Scores  PHQ - 2 Score 0 0 0 0 0 0 0  PHQ- 9 Score 0 0 0 0       Fall Risk    07/25/2022    1:05 PM 07/08/2022    1:39 PM 02/25/2022    1:04 PM 01/07/2022   10:47 AM 07/04/2021    1:23 PM  Fall Risk   Falls in the past year? 0 0 0 0 0  Number falls in past yr: 0 0 0 0 0  Injury with Fall? 0 0 0 0 0  Risk for fall due to :  No Fall Risks No Fall Risks No Fall Risks No Fall Risks No Fall Risks  Follow up Falls prevention discussed Falls evaluation completed Falls evaluation completed Falls evaluation completed Falls evaluation completed    MEDICARE RISK AT HOME:  Medicare Risk at Home - 07/25/22 1304     Any stairs in or around the home? No    If so, are there any without handrails? No    Home free of loose throw rugs in walkways, pet beds, electrical cords, etc? Yes    Adequate lighting in your home to reduce risk of falls? Yes    Life alert? No    Use of a cane, walker or w/c? No    Grab bars in the bathroom? Yes    Shower chair or  bench in shower? Yes    Elevated toilet seat or a handicapped toilet? Yes             TIMED UP AND GO:  Was the test performed?  No    Cognitive Function:        07/25/2022    1:05 PM 12/15/2020    8:58 AM 10/30/2018   12:54 PM  6CIT Screen  What Year? 0 points  0 points  What month? 0 points  0 points  What time? 0 points  0 points  Count back from 20 0 points  0 points  Months in reverse 0 points 0 points 0 points  Repeat phrase 0 points  0 points  Total Score 0 points  0 points    Immunizations Immunization History  Administered Date(s) Administered   Fluad Quad(high Dose 65+) 11/03/2018, 11/10/2019, 12/13/2020, 01/07/2022   Influenza, High Dose Seasonal PF 11/15/2015, 11/04/2016, 11/22/2017   Influenza,inj,Quad PF,6+ Mos 11/05/2012, 11/29/2013, 10/18/2014   PFIZER(Purple Top)SARS-COV-2 Vaccination 03/26/2019, 04/21/2019, 12/14/2019   Pfizer Covid-19 Vaccine Bivalent Booster 51yrs & up 10/08/2020   Pneumococcal Conjugate-13 11/29/2013   Pneumococcal Polysaccharide-23 05/16/2011   Td 07/15/2008, 07/08/2022   Tdap 11/10/2019    TDAP status: Up to date  Flu Vaccine status: Up to date  Pneumococcal vaccine status: Up to date  Covid-19 vaccine status: Completed vaccines  Qualifies for Shingles Vaccine? Yes   Zostavax completed No   Shingrix Completed?: No.    Education has been provided regarding the importance of this vaccine. Patient has been advised to call insurance company to determine out of pocket expense if they have not yet received this vaccine. Advised may also receive vaccine at local pharmacy or Health Dept. Verbalized acceptance and understanding.  Screening Tests Health Maintenance  Topic Date Due   OPHTHALMOLOGY EXAM  03/08/2021   COVID-19 Vaccine (5 - 2023-24 season) 09/28/2021   FOOT EXAM  12/13/2021   Zoster Vaccines- Shingrix (1 of 2) 10/08/2022 (Originally 04/28/1964)   DEXA SCAN  07/08/2023 (Originally 04/29/2010)   INFLUENZA VACCINE   08/29/2022   HEMOGLOBIN A1C  01/07/2023   Diabetic kidney evaluation - eGFR measurement  07/08/2023   Diabetic kidney evaluation - Urine ACR  07/08/2023   Medicare Annual Wellness (AWV)  07/25/2023   DTaP/Tdap/Td (4 - Td or Tdap) 07/07/2032   Pneumonia Vaccine 29+ Years old  Completed   Hepatitis C Screening  Completed   HPV VACCINES  Aged Out    Health Maintenance  Health Maintenance Due  Topic Date Due   OPHTHALMOLOGY EXAM  03/08/2021   COVID-19 Vaccine (5 - 2023-24 season) 09/28/2021   FOOT EXAM  12/13/2021    Colorectal cancer screening: No longer required.  Mammogram status: No longer required due to age/patient declined.  Bone Density status: Never done  Lung Cancer Screening: (Low Dose CT Chest recommended if Age 83-80 years, 20 pack-year currently smoking OR have quit w/in 15years.) does not qualify.   Lung Cancer Screening Referral: no  Additional Screening:  Hepatitis C Screening: does not qualify; Completed no  Vision Screening: Recommended annual ophthalmology exams for early detection of glaucoma and other disorders of the eye. Is the patient up to date with their annual eye exam?  Yes  Who is the provider or what is the name of the office in which the patient attends annual eye exams? R. Fabian Sharp, MD. If pt is not established with a provider, would they like to be referred to a provider to establish care? No .   Dental Screening: Recommended annual dental exams for proper oral hygiene  Diabetic Foot Exam: Diabetic Foot Exam: Completed 12/13/2020; patient is overdue.  Community Resource Referral / Chronic Care Management: CRR required this visit?  No   CCM required this visit?  No     Plan:     I have personally reviewed and noted the following in the patient's chart:   Medical and social history Use of alcohol, tobacco or illicit drugs  Current medications and supplements including opioid prescriptions. Patient is not currently taking opioid  prescriptions. Functional ability and status Nutritional status Physical activity Advanced directives List of other physicians Hospitalizations, surgeries, and ER visits in previous 12 months Vitals Screenings to include cognitive, depression, and falls Referrals and appointments  In addition, I have reviewed and discussed with patient certain preventive protocols, quality metrics, and best practice recommendations. A written personalized care plan for preventive services as well as general preventive health recommendations were provided to patient.     Mickeal Needy, LPN   [id number]/[id number]   After Visit Summary: (Mail) Due to this being a telephonic visit, the after visit summary with patients personalized plan was offered to patient via mail   Nurse Notes: Normal cognitive status assessed by direct observation via telephone conversation by this Nurse Health Advisor. No abnormalities found.

## 2022-07-25 NOTE — Patient Instructions (Addendum)
Sally Douglas , Thank you for taking time to come for your Medicare Wellness Visit. I appreciate your ongoing commitment to your health goals. Please review the following plan we discussed and let me know if I can assist you in the future.   These are the goals we discussed:  Goals      My goal for 2024 is to live to see another year.        This is a list of the screening recommended for you and due dates:  Health Maintenance  Topic Date Due   Eye exam for diabetics  03/08/2021   COVID-19 Vaccine (5 - 2023-24 season) 09/28/2021   Complete foot exam   12/13/2021   Zoster (Shingles) Vaccine (1 of 2) 10/08/2022*   DEXA scan (bone density measurement)  07/08/2023*   Flu Shot  08/29/2022   Hemoglobin A1C  01/07/2023   Yearly kidney function blood test for diabetes  07/08/2023   Yearly kidney health urinalysis for diabetes  07/08/2023   Medicare Annual Wellness Visit  07/25/2023   DTaP/Tdap/Td vaccine (4 - Td or Tdap) 07/07/2032   Pneumonia Vaccine  Completed   Hepatitis C Screening  Completed   HPV Vaccine  Aged Out  *Topic was postponed. The date shown is not the original due date.    Advanced directives: Yes; Daughter is aware of patient's wishes.  Conditions/risks identified: Yes  Next appointment: It was nice speaking with you today!  Please follow up in one year for your annual wellness visit via telephone call with Nurse Percell Miller on 07/31/2023 at 2:00 p.m.  If you need to cancel or reschedule please call [phone].   Preventive Care 19 Years and Older, Female Preventive care refers to lifestyle choices and visits with your health care provider that can promote health and wellness. What does preventive care include? A yearly physical exam. This is also called an annual well check. Dental exams once or twice a year. Routine eye exams. Ask your health care provider how often you should have your eyes checked. Personal lifestyle choices, including: Daily care of your teeth and  gums. Regular physical activity. Eating a healthy diet. Avoiding tobacco and drug use. Limiting alcohol use. Practicing safe sex. Taking low-dose aspirin every day. Taking vitamin and mineral supplements as recommended by your health care provider. What happens during an annual well check? The services and screenings done by your health care provider during your annual well check will depend on your age, overall health, lifestyle risk factors, and family history of disease. Counseling  Your health care provider may ask you questions about your: Alcohol use. Tobacco use. Drug use. Emotional well-being. Home and relationship well-being. Sexual activity. Eating habits. History of falls. Memory and ability to understand (cognition). Work and work Astronomer. Reproductive health. Screening  You may have the following tests or measurements: Height, weight, and BMI. Blood pressure. Lipid and cholesterol levels. These may be checked every 5 years, or more frequently if you are over 50 years old. Skin check. Lung cancer screening. You may have this screening every year starting at age 8 if you have a 30-pack-year history of smoking and currently smoke or have quit within the past 15 years. Fecal occult blood test (FOBT) of the stool. You may have this test every year starting at age 30. Flexible sigmoidoscopy or colonoscopy. You may have a sigmoidoscopy every 5 years or a colonoscopy every 10 years starting at age 65. Hepatitis C blood test. Hepatitis B blood test.  Sexually transmitted disease (STD) testing. Diabetes screening. This is done by checking your blood sugar (glucose) after you have not eaten for a while (fasting). You may have this done every 1-3 years. Bone density scan. This is done to screen for osteoporosis. You may have this done starting at age 49. Mammogram. This may be done every 1-2 years. Talk to your health care provider about how often you should have regular  mammograms. Talk with your health care provider about your test results, treatment options, and if necessary, the need for more tests. Vaccines  Your health care provider may recommend certain vaccines, such as: Influenza vaccine. This is recommended every year. Tetanus, diphtheria, and acellular pertussis (Tdap, Td) vaccine. You may need a Td booster every 10 years. Zoster vaccine. You may need this after age 59. Pneumococcal 13-valent conjugate (PCV13) vaccine. One dose is recommended after age 50. Pneumococcal polysaccharide (PPSV23) vaccine. One dose is recommended after age 75. Talk to your health care provider about which screenings and vaccines you need and how often you need them. This information is not intended to replace advice given to you by your health care provider. Make sure you discuss any questions you have with your health care provider. Document Released: 02/10/2015 Document Revised: 10/04/2015 Document Reviewed: 11/15/2014 Elsevier Interactive Patient Education  2017 [place] Prevention in the Home Falls can cause injuries. They can happen to people of all ages. There are many things you can do to make your home safe and to help prevent falls. What can I do on the outside of my home? Regularly fix the edges of walkways and driveways and fix any cracks. Remove anything that might make you trip as you walk through a door, such as a raised step or threshold. Trim any bushes or trees on the path to your home. Use bright outdoor lighting. Clear any walking paths of anything that might make someone trip, such as rocks or tools. Regularly check to see if handrails are loose or broken. Make sure that both sides of any steps have handrails. Any raised decks and porches should have guardrails on the edges. Have any leaves, snow, or ice cleared regularly. Use sand or salt on walking paths during winter. Clean up any spills in your garage right away. This includes oil  or grease spills. What can I do in the bathroom? Use night lights. Install grab bars by the toilet and in the tub and shower. Do not use towel bars as grab bars. Use non-skid mats or decals in the tub or shower. If you need to sit down in the shower, use a plastic, non-slip stool. Keep the floor dry. Clean up any water that spills on the floor as soon as it happens. Remove soap buildup in the tub or shower regularly. Attach bath mats securely with double-sided non-slip rug tape. Do not have throw rugs and other things on the floor that can make you trip. What can I do in the bedroom? Use night lights. Make sure that you have a light by your bed that is easy to reach. Do not use any sheets or blankets that are too big for your bed. They should not hang down onto the floor. Have a firm chair that has side arms. You can use this for support while you get dressed. Do not have throw rugs and other things on the floor that can make you trip. What can I do in the kitchen? Clean up any spills right away.  Avoid walking on wet floors. Keep items that you use a lot in easy-to-reach places. If you need to reach something above you, use a strong step stool that has a grab bar. Keep electrical cords out of the way. Do not use floor polish or wax that makes floors slippery. If you must use wax, use non-skid floor wax. Do not have throw rugs and other things on the floor that can make you trip. What can I do with my stairs? Do not leave any items on the stairs. Make sure that there are handrails on both sides of the stairs and use them. Fix handrails that are broken or loose. Make sure that handrails are as long as the stairways. Check any carpeting to make sure that it is firmly attached to the stairs. Fix any carpet that is loose or worn. Avoid having throw rugs at the top or bottom of the stairs. If you do have throw rugs, attach them to the floor with carpet tape. Make sure that you have a light  switch at the top of the stairs and the bottom of the stairs. If you do not have them, ask someone to add them for you. What else can I do to help prevent falls? Wear shoes that: Do not have high heels. Have rubber bottoms. Are comfortable and fit you well. Are closed at the toe. Do not wear sandals. If you use a stepladder: Make sure that it is fully opened. Do not climb a closed stepladder. Make sure that both sides of the stepladder are locked into place. Ask someone to hold it for you, if possible. Clearly mark and make sure that you can see: Any grab bars or handrails. First and last steps. Where the edge of each step is. Use tools that help you move around (mobility aids) if they are needed. These include: Canes. Walkers. Scooters. Crutches. Turn on the lights when you go into a dark area. Replace any light bulbs as soon as they burn out. Set up your furniture so you have a clear path. Avoid moving your furniture around. If any of your floors are uneven, fix them. If there are any pets around you, be aware of where they are. Review your medicines with your doctor. Some medicines can make you feel dizzy. This can increase your chance of falling. Ask your doctor what other things that you can do to help prevent falls. This information is not intended to replace advice given to you by your health care provider. Make sure you discuss any questions you have with your health care provider. Document Released: 11/10/2008 Document Revised: 06/22/2015 Document Reviewed: 02/18/2014 Elsevier Interactive Patient Education  2017 [employer].

## 2022-09-24 ENCOUNTER — Telehealth: Payer: Self-pay | Admitting: Internal Medicine

## 2022-09-24 NOTE — Telephone Encounter (Signed)
Handicapped placard received via mail - patient has included a return envelope.

## 2022-09-25 NOTE — Telephone Encounter (Signed)
Received

## 2022-09-25 NOTE — Telephone Encounter (Signed)
Placed in Dr. Carver Fila folder for signature

## 2023-01-12 ENCOUNTER — Encounter: Payer: Self-pay | Admitting: Internal Medicine

## 2023-01-12 NOTE — Progress Notes (Unsigned)
      Subjective:    Patient ID: Sally Douglas, female    DOB: [birth year], 77 y.o.   MRN: [id number]     HPI Sally Douglas is here for follow up of her chronic medical problems.  Dm - explain  Discuss lung noduels - ? F/u ct  Medications and allergies reviewed with patient and updated if appropriate.  Current Outpatient Medications on File Prior to Visit  Medication Sig Dispense Refill   amLODipine (NORVASC) 10 MG tablet Take 1 tablet (10 mg total) by mouth daily. 90 tablet 3   bifidobacterium infantis (ALIGN) capsule Take 1 capsule by mouth daily. 90 capsule 3   Cholecalciferol (VITAMIN D) 125 MCG (5000 UT) CAPS Take by mouth daily.     losartan-hydrochlorothiazide (HYZAAR) 100-25 MG tablet TAKE 1/2 TABLETS BY MOUTH DAILY. IN THE MORNING 45 tablet 3   Multiple Vitamins-Minerals (MULTIVITAMIN GUMMIES ADULT PO) Take by mouth daily at 12 noon.     omeprazole (PRILOSEC) 40 MG capsule Take 1 capsule (40 mg total) by mouth daily. 90 capsule 3   oxyCODONE-acetaminophen (PERCOCET) 7.5-325 MG tablet Take 1 tablet by mouth every 6 (six) hours as needed.     VOLTAREN 1 % GEL Apply 1 application topically daily as needed. FOR HANDS 100 g 6   No current facility-administered medications on file prior to visit.     Review of Systems     Objective:  There were no vitals filed for this visit. BP Readings from Last 3 Encounters:  07/08/22 (!) 140/70  02/25/22 132/72  01/07/22 120/74   Wt Readings from Last 3 Encounters:  07/25/22 137 lb (62.1 kg)  07/08/22 137 lb 3.2 oz (62.2 kg)  02/25/22 131 lb (59.4 kg)   There is no height or weight on file to calculate BMI.    Physical Exam     Lab Results  Component Value Date   WBC 11.8 (H) 07/08/2022   HGB 13.3 07/08/2022   HCT 41.7 07/08/2022   PLT 261.0 07/08/2022   GLUCOSE 119 (H) 07/08/2022   CHOL 155 07/08/2022   TRIG 81.0 07/08/2022   HDL 82.00 07/08/2022   LDLCALC 57 07/08/2022   ALT 14 07/08/2022   AST 17 07/08/2022   NA 142  07/08/2022   K 3.7 07/08/2022   CL 101 07/08/2022   CREATININE 0.92 07/08/2022   BUN 16 07/08/2022   CO2 30 07/08/2022   TSH 0.40 12/13/2020   HGBA1C 6.2 07/08/2022   MICROALBUR 1.8 07/08/2022     Assessment & Plan:    See Problem List for Assessment and Plan of chronic medical problems.

## 2023-01-12 NOTE — Patient Instructions (Addendum)
      Blood work was ordered.       Medications changes include :   prednisone 40 mg daily x 5 days and Augmentin twice a day x 10 days.  Try trelegy inhaler once daily after finishing the prednisone - rinse your mouth out with water after using.      If there is no improvement with above we can have you see pulmonary again.     Return in about 6 months (around 07/14/2023) for follow up.

## 2023-01-13 ENCOUNTER — Ambulatory Visit (INDEPENDENT_AMBULATORY_CARE_PROVIDER_SITE_OTHER): Payer: Medicare Other | Admitting: Internal Medicine

## 2023-01-13 VITALS — BP 120/60 | HR 70 | Temp 97.8°F | Ht <= 58 in | Wt 138.0 lb

## 2023-01-13 DIAGNOSIS — E1169 Type 2 diabetes mellitus with other specified complication: Secondary | ICD-10-CM

## 2023-01-13 DIAGNOSIS — K219 Gastro-esophageal reflux disease without esophagitis: Secondary | ICD-10-CM | POA: Diagnosis not present

## 2023-01-13 DIAGNOSIS — Z23 Encounter for immunization: Secondary | ICD-10-CM | POA: Diagnosis not present

## 2023-01-13 DIAGNOSIS — N1831 Chronic kidney disease, stage 3a: Secondary | ICD-10-CM | POA: Diagnosis not present

## 2023-01-13 DIAGNOSIS — I152 Hypertension secondary to endocrine disorders: Secondary | ICD-10-CM | POA: Diagnosis not present

## 2023-01-13 DIAGNOSIS — J40 Bronchitis, not specified as acute or chronic: Secondary | ICD-10-CM | POA: Insufficient documentation

## 2023-01-13 DIAGNOSIS — E1159 Type 2 diabetes mellitus with other circulatory complications: Secondary | ICD-10-CM | POA: Diagnosis not present

## 2023-01-13 DIAGNOSIS — Z7984 Long term (current) use of oral hypoglycemic drugs: Secondary | ICD-10-CM

## 2023-01-13 DIAGNOSIS — R918 Other nonspecific abnormal finding of lung field: Secondary | ICD-10-CM

## 2023-01-13 LAB — LIPID PANEL
Cholesterol: 152 mg/dL (ref 0–200)
HDL: 68.9 mg/dL (ref >39.00)
LDL Cholesterol: 53 mg/dL (ref 0–99)
NonHDL: 82.65
Total CHOL/HDL Ratio: 2
Triglycerides: 150 mg/dL — ABNORMAL HIGH (ref 0.0–149.0)
VLDL: 30 mg/dL (ref 0.0–40.0)

## 2023-01-13 LAB — HEMOGLOBIN A1C: Hgb A1c MFr Bld: 6.3 % (ref 4.6–6.5)

## 2023-01-13 LAB — CBC WITH DIFFERENTIAL/PLATELET
Basophils Absolute: 0.1 10*3/uL (ref 0.0–0.1)
Basophils Relative: 0.9 % (ref 0.0–3.0)
Eosinophils Absolute: 0.2 10*3/uL (ref 0.0–0.7)
Eosinophils Relative: 1.7 % (ref 0.0–5.0)
HCT: 39.3 % (ref 36.0–46.0)
Hemoglobin: 12.7 g/dL (ref 12.0–15.0)
Lymphocytes Relative: 24.3 % (ref 12.0–46.0)
Lymphs Abs: 2.3 10*3/uL (ref 0.7–4.0)
MCHC: 32.4 g/dL (ref 30.0–36.0)
MCV: 86.1 fL (ref 78.0–100.0)
Monocytes Absolute: 0.6 10*3/uL (ref 0.1–1.0)
Monocytes Relative: 6.7 % (ref 3.0–12.0)
Neutro Abs: 6.3 10*3/uL (ref 1.4–7.7)
Neutrophils Relative %: 66.4 % (ref 43.0–77.0)
Platelets: 285 10*3/uL (ref 150.0–400.0)
RBC: 4.57 Mil/uL (ref 3.87–5.11)
RDW: 15.3 % (ref 11.5–15.5)
WBC: 9.4 10*3/uL (ref 4.0–10.5)

## 2023-01-13 LAB — COMPREHENSIVE METABOLIC PANEL
ALT: 13 U/L (ref 0–35)
AST: 17 U/L (ref 0–37)
Albumin: 4.4 g/dL (ref 3.5–5.2)
Alkaline Phosphatase: 79 U/L (ref 39–117)
BUN: 16 mg/dL (ref 6–23)
CO2: 30 meq/L (ref 19–32)
Calcium: 9.7 mg/dL (ref 8.4–10.5)
Chloride: 102 meq/L (ref 96–112)
Creatinine, Ser: 1.1 mg/dL (ref 0.40–1.20)
GFR: 48.4 mL/min — ABNORMAL LOW (ref >60.00)
Glucose, Bld: 97 mg/dL (ref 70–99)
Potassium: 4.7 meq/L (ref 3.5–5.1)
Sodium: 140 meq/L (ref 135–145)
Total Bilirubin: 0.5 mg/dL (ref 0.2–1.2)
Total Protein: 7 g/dL (ref 6.0–8.3)

## 2023-01-13 MED ORDER — PREDNISONE 20 MG PO TABS: 40.0000 mg | ORAL_TABLET | Freq: Every day | ORAL | 0 refills | Status: AC

## 2023-01-13 MED ORDER — AMOXICILLIN-POT CLAVULANATE 875-125 MG PO TABS: 1.0000 | ORAL_TABLET | Freq: Two times a day (BID) | ORAL | 0 refills | Status: DC

## 2023-01-13 MED ORDER — TRELEGY ELLIPTA 100-62.5-25 MCG/ACT IN AEPB
1.0000 | INHALATION_SPRAY | Freq: Every day | RESPIRATORY_TRACT | Status: DC
Start: 2023-01-13 — End: 2023-07-29

## 2023-01-13 NOTE — Assessment & Plan Note (Addendum)
CT scan in the hospital showed 2 lung nodules 1.1 cm nodule in RLL is not new-This had been followed by pulmonary and was stable.  Still stable therefore benign 4 mm nodule in LLL appears to be new-not mentioned on previous CT scans Discussed follow-up CT in 1 year-01/2023-she deferred this at this time

## 2023-01-13 NOTE — Assessment & Plan Note (Signed)
Subacute Has symptoms of bronchitis-some of her symptoms are more suggestive of chronic bronchitis, but she has not been diagnosed with this in the past She feels like she never has gotten back to her normal level since her pneumonia and hospitalization in January 2024 Symptoms did get worse about a month ago and although they have improved some she is still having increased shortness of breath, wheezing, cough that is productive therefore will treat like possible COPD flare or acute bronchitis with COPD flare Augmentin 875-125 mg twice daily x 10 days Prednisone 40 mg daily x 5 days Samples of Trelegy given-she will start using this after she completes the prednisone to see if that helps with some shortness of breath Discussed seeing pulmonary-she deferred for now

## 2023-01-13 NOTE — Assessment & Plan Note (Addendum)
Chronic GERD controlled Denies any symptoms of reflux Continue omeprazole 40 mg daily

## 2023-01-13 NOTE — Assessment & Plan Note (Signed)
Chronic Mild in nature Has remained stable over several years Does not take any NSAIDs Stressed good water intake Blood pressure, sugar well-controlled CMP, cbc

## 2023-01-13 NOTE — Assessment & Plan Note (Signed)
Chronic  Lab Results  Component Value Date   HGBA1C 6.2 07/08/2022   Sugars well controlled Check A1c, lipids Continue lifestyle control Stressed regular exercise, diabetic diet

## 2023-01-13 NOTE — Assessment & Plan Note (Addendum)
Chronic Blood pressure controlled  CMP Continue Norvasc 10 mg daily, losartan-HCTZ 100-25 mg   1/2 tab daily

## 2023-01-14 ENCOUNTER — Encounter: Payer: Self-pay | Admitting: Internal Medicine

## 2023-05-18 ENCOUNTER — Encounter: Payer: Self-pay | Admitting: Internal Medicine

## 2023-05-18 ENCOUNTER — Other Ambulatory Visit: Payer: Self-pay | Admitting: Internal Medicine

## 2023-06-11 ENCOUNTER — Other Ambulatory Visit (HOSPITAL_COMMUNITY): Payer: Self-pay

## 2023-07-13 NOTE — Patient Instructions (Addendum)
      Blood work was ordered.       Medications changes include :   None    A referral was ordered and someone will call you to schedule an appointment.     Return in about 6 months (around 01/13/2024) for follow up.

## 2023-07-13 NOTE — Progress Notes (Unsigned)
 Subjective:    Patient ID: Sally Douglas, female    DOB: Jan 16, 1946, 78 y.o.   MRN: [id number]     HPI Sally Douglas is here for follow up of her chronic medical problems.  CT angio 02/07/2022 at [facility]-1.1 cm right lower lobe nodule, 4 mm left lower lobe nodule-follow-up recommended.  Last CT in our system was 2018-1 cm right lower lobe nodule seen, no other nodule mentioned.  She did find that the inhaler I am giving her last time did help with her symptoms.  She is also used on occasion when short of breath and it has helped.  Medications and allergies reviewed with patient and updated if appropriate.  Current Outpatient Medications on File Prior to Visit  Medication Sig Dispense Refill   amLODipine  (NORVASC ) 10 MG tablet Take 1 tablet (10 mg total) by mouth daily. 90 tablet 3   bifidobacterium infantis (ALIGN) capsule Take 1 capsule by mouth daily. 90 capsule 3   Cholecalciferol (VITAMIN D ) 125 MCG (5000 UT) CAPS Take by mouth daily.     losartan -hydrochlorothiazide  (HYZAAR) 100-25 MG tablet TAKE 1/2 TABLETS BY MOUTH DAILY. IN THE MORNING 45 tablet 3   omeprazole  (PRILOSEC) 40 MG capsule Take 1 capsule (40 mg total) by mouth daily. 90 capsule 3   oxyCODONE -acetaminophen  (PERCOCET) 7.5-325 MG tablet Take 1 tablet by mouth every 6 (six) hours as needed.     Fluticasone-Umeclidin-Vilant (TRELEGY ELLIPTA ) 100-62.5-25 MCG/ACT AEPB Inhale 1 puff into the lungs daily.     VOLTAREN  1 % GEL Apply 1 application topically daily as needed. FOR HANDS 100 g 6   No current facility-administered medications on file prior to visit.     Review of Systems  Constitutional:  Negative for fever.  Respiratory:  Positive for cough (occ - occ productive) and shortness of breath (with moderate exertion). Negative for chest tightness and wheezing.   Cardiovascular:  Negative for chest pain, palpitations and leg swelling.  Neurological:  Negative for light-headedness and headaches.        Objective:   Vitals:   07/14/23 1118  BP: 126/74  Pulse: 62  Temp: 97.9 F (36.6 C)  SpO2: 94%   BP Readings from Last 3 Encounters:  07/14/23 126/74  01/13/23 120/60  07/08/22 (!) 140/70   Wt Readings from Last 3 Encounters:  07/14/23 140 lb (63.5 kg)  01/13/23 138 lb (62.6 kg)  07/25/22 137 lb (62.1 kg)   Body mass index is 31.39 kg/m.    Physical Exam Constitutional:      General: She is not in acute distress.    Appearance: Normal appearance.  HENT:     Head: Normocephalic and atraumatic.   Eyes:     Conjunctiva/sclera: Conjunctivae normal.    Cardiovascular:     Rate and Rhythm: Normal rate and regular rhythm.     Heart sounds: Normal heart sounds.  Pulmonary:     Effort: Pulmonary effort is normal. No respiratory distress.     Breath sounds: Normal breath sounds. No wheezing.   Musculoskeletal:     Cervical back: Neck supple.     Right lower leg: No edema.     Left lower leg: No edema.  Lymphadenopathy:     Cervical: No cervical adenopathy.   Skin:    General: Skin is warm and dry.     Findings: No rash.   Neurological:     Mental Status: She is alert. Mental status is at baseline.   Psychiatric:  Mood and Affect: Mood normal.        Behavior: Behavior normal.        Lab Results  Component Value Date   WBC 9.4 01/13/2023   HGB 12.7 01/13/2023   HCT 39.3 01/13/2023   PLT 285.0 01/13/2023   GLUCOSE 97 01/13/2023   CHOL 152 01/13/2023   TRIG 150.0 (H) 01/13/2023   HDL 68.90 01/13/2023   LDLCALC 53 01/13/2023   ALT 13 01/13/2023   AST 17 01/13/2023   NA 140 01/13/2023   K 4.7 01/13/2023   CL 102 01/13/2023   CREATININE 1.10 01/13/2023   BUN 16 01/13/2023   CO2 30 01/13/2023   TSH 0.40 12/13/2020   HGBA1C 6.3 01/13/2023   MICROALBUR 1.8 07/08/2022     Assessment & Plan:    See Problem List for Assessment and Plan of chronic medical problems.

## 2023-07-14 ENCOUNTER — Ambulatory Visit (INDEPENDENT_AMBULATORY_CARE_PROVIDER_SITE_OTHER): Payer: Medicare Other | Admitting: Internal Medicine

## 2023-07-14 ENCOUNTER — Encounter: Payer: Self-pay | Admitting: Internal Medicine

## 2023-07-14 VITALS — BP 126/74 | HR 62 | Temp 97.9°F | Ht <= 58 in | Wt 140.0 lb

## 2023-07-14 DIAGNOSIS — N1831 Chronic kidney disease, stage 3a: Secondary | ICD-10-CM | POA: Diagnosis not present

## 2023-07-14 DIAGNOSIS — E1159 Type 2 diabetes mellitus with other circulatory complications: Secondary | ICD-10-CM | POA: Diagnosis not present

## 2023-07-14 DIAGNOSIS — K219 Gastro-esophageal reflux disease without esophagitis: Secondary | ICD-10-CM

## 2023-07-14 DIAGNOSIS — E1169 Type 2 diabetes mellitus with other specified complication: Secondary | ICD-10-CM

## 2023-07-14 DIAGNOSIS — K439 Ventral hernia without obstruction or gangrene: Secondary | ICD-10-CM

## 2023-07-14 DIAGNOSIS — R918 Other nonspecific abnormal finding of lung field: Secondary | ICD-10-CM

## 2023-07-14 DIAGNOSIS — M549 Dorsalgia, unspecified: Secondary | ICD-10-CM

## 2023-07-14 DIAGNOSIS — I152 Hypertension secondary to endocrine disorders: Secondary | ICD-10-CM

## 2023-07-14 DIAGNOSIS — G8929 Other chronic pain: Secondary | ICD-10-CM

## 2023-07-14 DIAGNOSIS — R0609 Other forms of dyspnea: Secondary | ICD-10-CM | POA: Insufficient documentation

## 2023-07-14 NOTE — Addendum Note (Signed)
 Addended by: Colene Dauphin on: 07/14/2023 12:29 PM   Modules accepted: Orders

## 2023-07-14 NOTE — Assessment & Plan Note (Signed)
 Chronic Following with pain management Currently on oxycodone -acetaminophen  7.5-325 mg 1 pill every 6 hours as needed Overall she feels her pain is controlled any pain medication has improved her quality of life

## 2023-07-14 NOTE — Assessment & Plan Note (Signed)
Chronic GERD controlled Denies any symptoms of reflux Continue omeprazole 40 mg daily

## 2023-07-14 NOTE — Assessment & Plan Note (Signed)
Chronic Large ventral hernia Occasionally does cause some discomfort She would like to avoid surgery

## 2023-07-14 NOTE — Assessment & Plan Note (Signed)
 Chronic Dyspnea on exertion-stable Trelegy has helped and she has taken it intermittently Likely has COPD-former smoker, but not officially diagnosed Samples of Trelegy given to use daily as needed since she cannot afford to use it on a daily basis Trelegy 100 mg daily as needed-advised to rinse mouth out after use

## 2023-07-14 NOTE — Assessment & Plan Note (Signed)
 Chronic Mild in nature Has remained stable over several years Does not take any NSAIDs Stressed good water intake Blood pressure, sugar well-controlled CMP, cbc

## 2023-07-14 NOTE — Assessment & Plan Note (Signed)
 Chronic CT scan 01/2022 and [facility] showed RLL nodule 1.1 cm which is stable compared to previous CT scans in our system and a new 4 mm LLL nodule Discussed getting CT scan for follow-up to make sure there has been no change, especially in the left lower lobe nodule She deferred getting the scan at this time she understands that it could be potentially cancer and still deferred scan.

## 2023-07-14 NOTE — Assessment & Plan Note (Signed)
 Chronic Blood pressure controlled  CMP, CBC Continue Norvasc  10 mg daily, losartan -HCTZ 100-25 mg   1/2 tab daily

## 2023-07-14 NOTE — Assessment & Plan Note (Signed)
 Chronic  Lab Results  Component Value Date   HGBA1C 6.3 01/13/2023   Sugars well controlled Check A1c, lipids, urine microalbumin Continue lifestyle control Stressed regular exercise, diabetic diet

## 2023-07-15 LAB — CBC WITH DIFFERENTIAL/PLATELET
Absolute Lymphocytes: 2576 {cells}/uL (ref 850–3900)
Absolute Monocytes: 763 {cells}/uL (ref 200–950)
Basophils Absolute: 74 {cells}/uL (ref 0–200)
Basophils Relative: 0.7 %
Eosinophils Absolute: 180 {cells}/uL (ref 15–500)
Eosinophils Relative: 1.7 %
HCT: 39.4 % (ref 35.0–45.0)
Hemoglobin: 12.1 g/dL (ref 11.7–15.5)
MCH: 25.9 pg — ABNORMAL LOW (ref 27.0–33.0)
MCHC: 30.7 g/dL — ABNORMAL LOW (ref 32.0–36.0)
MCV: 84.4 fL (ref 80.0–100.0)
MPV: 10.6 fL (ref 7.5–12.5)
Monocytes Relative: 7.2 %
Neutro Abs: 7007 {cells}/uL (ref 1500–7800)
Neutrophils Relative %: 66.1 %
Platelets: 299 10*3/uL (ref 140–400)
RBC: 4.67 10*6/uL (ref 3.80–5.10)
RDW: 14.5 % (ref 11.0–15.0)
Total Lymphocyte: 24.3 %
WBC: 10.6 10*3/uL (ref 3.8–10.8)

## 2023-07-15 LAB — COMPREHENSIVE METABOLIC PANEL WITH GFR
AG Ratio: 2 (calc) (ref 1.0–2.5)
ALT: 9 U/L (ref 6–29)
AST: 11 U/L (ref 10–35)
Albumin: 4.5 g/dL (ref 3.6–5.1)
Alkaline phosphatase (APISO): 81 U/L (ref 37–153)
BUN: 17 mg/dL (ref 7–25)
CO2: 25 mmol/L (ref 20–32)
Calcium: 9.3 mg/dL (ref 8.6–10.4)
Chloride: 102 mmol/L (ref 98–110)
Creat: 0.98 mg/dL (ref 0.60–1.00)
Globulin: 2.2 g/dL (ref 1.9–3.7)
Glucose, Bld: 89 mg/dL (ref 65–99)
Potassium: 4.1 mmol/L (ref 3.5–5.3)
Sodium: 140 mmol/L (ref 135–146)
Total Bilirubin: 0.4 mg/dL (ref 0.2–1.2)
Total Protein: 6.7 g/dL (ref 6.1–8.1)
eGFR: 59 mL/min/{1.73_m2} — ABNORMAL LOW (ref >=60)

## 2023-07-15 LAB — LIPID PANEL
Cholesterol: 157 mg/dL (ref <200)
HDL: 69 mg/dL (ref >=50)
LDL Cholesterol (Calc): 62 mg/dL
Non-HDL Cholesterol (Calc): 88 mg/dL (ref <130)
Total CHOL/HDL Ratio: 2.3 (calc) (ref <5.0)
Triglycerides: 179 mg/dL — ABNORMAL HIGH (ref <150)

## 2023-07-15 LAB — HEMOGLOBIN A1C
Hgb A1c MFr Bld: 6.1 % — ABNORMAL HIGH (ref <5.7)
Mean Plasma Glucose: 128 mg/dL
eAG (mmol/L): 7.1 mmol/L

## 2023-07-15 LAB — MICROALBUMIN / CREATININE URINE RATIO
Creatinine, Urine: 165 mg/dL (ref 20–275)
Microalb Creat Ratio: 5 mg/g{creat} (ref <30)
Microalb, Ur: 0.8 mg/dL

## 2023-07-16 ENCOUNTER — Ambulatory Visit: Payer: Self-pay | Admitting: Internal Medicine

## 2023-07-21 ENCOUNTER — Telehealth: Payer: Self-pay | Admitting: *Deleted

## 2023-07-21 NOTE — Progress Notes (Unsigned)
 Care Guide Pharmacy Note  07/21/2023 Name: Sally Douglas MRN: [id number] DOB: Dec 10, 1945  Referred By: Geofm Glade PARAS, MD Reason for referral: Complex Care Management (Outreach to schedule referral with pharmacist ) and Call Attempt #1   Sally Douglas is a 78 y.o. year old female who is a primary care patient of Geofm, Glade PARAS, MD.  [name] was referred to the pharmacist for assistance related to: Trelegy   An unsuccessful telephone outreach was attempted today to contact the patient who was referred to the pharmacy team for assistance with medication assistance. Additional attempts will be made to contact the patient.  Thedford Franks, CMA [place]  [facility], [facility] Guide Direct Dial: [phone]  Fax: [phone] Website: [email or web address]

## 2023-07-23 NOTE — Progress Notes (Signed)
 Care Guide Pharmacy Note  07/23/2023 Name: Sally Douglas MRN: [id number] DOB: 09/06/1945  Referred By: Geofm Glade PARAS, MD Reason for referral: Complex Care Management (Outreach to schedule referral with pharmacist ) and Call Attempt #1   Sally Douglas is a 78 y.o. year old female who is a primary care patient of Geofm Glade PARAS, MD.  Sally Douglas was referred to the pharmacist for assistance related to: trelegy  Successful contact was made with the patient to discuss pharmacy services including being ready for the pharmacist to call at least 5 minutes before the scheduled appointment time and to have medication bottles and any blood pressure readings ready for review. The patient agreed to meet with the pharmacist via telephone visit on 07/29/2023  Thedford Franks, CMA [place]  [facility], [facility] Guide Direct Dial: [phone]  Fax: [phone] Website: [email or web address]

## 2023-07-29 ENCOUNTER — Other Ambulatory Visit (INDEPENDENT_AMBULATORY_CARE_PROVIDER_SITE_OTHER): Admitting: Pharmacist

## 2023-07-29 DIAGNOSIS — R0609 Other forms of dyspnea: Secondary | ICD-10-CM

## 2023-07-29 MED ORDER — BREZTRI AEROSPHERE 160-9-4.8 MCG/ACT IN AERO: 2.0000 | INHALATION_SPRAY | Freq: Two times a day (BID) | RESPIRATORY_TRACT | Status: AC

## 2023-07-29 NOTE — Progress Notes (Signed)
   07/29/2023 Name: Sally Douglas MRN: [id number] DOB: 1945/09/03  Chief Complaint  Patient presents with   Medication Access    Sally Douglas is a 78 y.o. year old female who presented for a telephone visit.   They were referred to the pharmacist by their PCP for assistance in managing medication access.    Subjective:  Care Team: Primary Care Provider: Geofm Glade PARAS, MD ; Next Scheduled Visit: 01/13/24   Medication Access/Adherence  Current Pharmacy:  [facility] [phone] - [place], [place] - [address] [place] [address] Phone: [phone] Fax: [phone]   Patient reports affordability concerns with their medications: Yes  Patient reports access/transportation concerns to their pharmacy: No  Patient reports adherence concerns with their medications:  No     PCP prescribed Trelegy however pt cannot take daily due to inability to afford the copay.   Objective:  Lab Results  Component Value Date   HGBA1C 6.1 (H) 07/14/2023    Lab Results  Component Value Date   CREATININE 0.98 07/14/2023   BUN 17 07/14/2023   NA 140 07/14/2023   K 4.1 07/14/2023   CL 102 07/14/2023   CO2 25 07/14/2023    Lab Results  Component Value Date   CHOL 157 07/14/2023   HDL 69 07/14/2023   LDLCALC 62 07/14/2023   TRIG 179 (H) 07/14/2023   CHOLHDL 2.3 07/14/2023    Medications Reviewed Today     Reviewed by Merceda Lela SAUNDERS, RPH (Pharmacist) on 07/29/23 at 1418  Med List Status: <None>   Medication Order Taking? Sig Documenting Provider Last Dose Status Informant  amLODipine  (NORVASC ) 10 MG tablet [id number]  Take 1 tablet (10 mg total) by mouth daily. Geofm Glade PARAS, MD  Active   bifidobacterium infantis (ALIGN) capsule [id number]  Take 1 capsule by mouth daily. McLean-Scocuzza, Randine SAILOR, MD  Active   Cholecalciferol (VITAMIN D ) 125 MCG (5000 UT) CAPS [id number]  Take by mouth daily. [provider]   Active   Fluticasone-Umeclidin-Vilant (TRELEGY ELLIPTA ) 100-62.5-25 MCG/ACT AEPB [id number] Yes Inhale 1 puff into the lungs daily.  Patient taking differently: Inhale 1 puff into the lungs daily as needed (sob).   Geofm Glade PARAS, MD  Active   losartan -hydrochlorothiazide  (HYZAAR) 100-25 MG tablet [id number]  TAKE 1/2 TABLETS BY MOUTH DAILY. IN THE MORNING Burns, Glade PARAS, MD  Active   omeprazole  (PRILOSEC) 40 MG capsule [id number]  Take 1 capsule (40 mg total) by mouth daily. Geofm Glade PARAS, MD  Active   oxyCODONE -acetaminophen  (PERCOCET) 7.5-325 MG tablet [id number]  Take 1 tablet by mouth every 6 (six) hours as needed. [provider]  Active   VOLTAREN  1 % GEL [id number]  Apply 1 application topically daily as needed. FOR HANDS Jame Maude FALCON, MD  Active               Assessment/Plan:   Pt qualifies for LIS and Farxiga PAP. She prefers to do PAP currently as she has no other high cost medications. Recommend changing to Breztri to apply for PAP.  Instructed Breztri is to be taken 2 puffs BID and must rinse mouth out after each use.  Follow Up Plan: PRN  Darrelyn Merceda, PharmD, BCPS, CPP Clinical Pharmacist Practitioner [facility] at [facility] [phone]

## 2023-07-29 NOTE — Patient Instructions (Signed)
 It was a pleasure speaking with you today!  We will fill out the patient assistance application for Breztri inhaler to take the place of Trelegy. Breztri is taken as 2 puffs twice daily. Rinse mouth after each use.  Feel free to call with any questions or concerns!  Darrelyn Drum, PharmD, BCPS, CPP Clinical Pharmacist Practitioner [place] [facility] at [facility] [phone]

## 2023-08-02 ENCOUNTER — Other Ambulatory Visit: Payer: Self-pay | Admitting: Internal Medicine

## 2023-08-02 DIAGNOSIS — I10 Essential (primary) hypertension: Secondary | ICD-10-CM

## 2023-08-15 ENCOUNTER — Other Ambulatory Visit (HOSPITAL_COMMUNITY): Payer: Self-pay

## 2023-08-18 ENCOUNTER — Telehealth: Payer: Self-pay | Admitting: Pharmacist

## 2023-08-18 NOTE — Telephone Encounter (Signed)
 Received PCP signature for Breztri  patient assistance application. Faxed to [employer] and to [employer] fax number. Please document and follow up on application status.  Darrelyn Drum, PharmD, BCPS, CPP Clinical Pharmacist Practitioner [place] [facility] at [facility] [phone]

## 2023-09-01 ENCOUNTER — Other Ambulatory Visit: Payer: Self-pay | Admitting: Internal Medicine

## 2023-09-01 DIAGNOSIS — I10 Essential (primary) hypertension: Secondary | ICD-10-CM

## 2023-09-01 DIAGNOSIS — K259 Gastric ulcer, unspecified as acute or chronic, without hemorrhage or perforation: Secondary | ICD-10-CM

## 2024-01-12 ENCOUNTER — Encounter: Payer: Self-pay | Admitting: Internal Medicine

## 2024-01-12 NOTE — Progress Notes (Unsigned)
 Subjective:    Patient ID: Sally Douglas, female    DOB: 1945/05/31, 78 y.o.   MRN: [id number]     HPI Sally Douglas is here for follow up of her chronic medical problems.  Saw pain management last week for her chronic back pain.  She is taking pain medication daily and just finished steroids.      Medications and allergies reviewed with patient and updated if appropriate.  Medications Ordered Prior to Encounter[1]   Review of Systems  Constitutional:  Negative for fever.  Respiratory:  Positive for cough and shortness of breath. Negative for wheezing.   Cardiovascular:  Negative for chest pain, palpitations and leg swelling.  Musculoskeletal:  Positive for back pain.  Neurological:  Negative for light-headedness and headaches.  Psychiatric/Behavioral:  Positive for sleep disturbance.        Objective:   Vitals:   01/13/24 1101  BP: 112/68  Pulse: 81  Temp: 98.2 F (36.8 C)  SpO2: 94%   BP Readings from Last 3 Encounters:  01/13/24 112/68  07/14/23 126/74  01/13/23 120/60   Wt Readings from Last 3 Encounters:  01/13/24 146 lb (66.2 kg)  07/14/23 140 lb (63.5 kg)  01/13/23 138 lb (62.6 kg)   Body mass index is 32.73 kg/m.    Physical Exam Constitutional:      General: She is not in acute distress.    Appearance: Normal appearance.  HENT:     Head: Normocephalic and atraumatic.  Eyes:     Conjunctiva/sclera: Conjunctivae normal.  Cardiovascular:     Rate and Rhythm: Normal rate and regular rhythm.     Heart sounds: Normal heart sounds.  Pulmonary:     Effort: Pulmonary effort is normal. No respiratory distress.     Breath sounds: Normal breath sounds. No wheezing.  Musculoskeletal:     Cervical back: Neck supple.     Right lower leg: No edema.     Left lower leg: No edema.  Lymphadenopathy:     Cervical: No cervical adenopathy.  Skin:    General: Skin is warm and dry.     Findings: No rash.  Neurological:     Mental Status: She is alert.  Mental status is at baseline.  Psychiatric:        Mood and Affect: Mood normal.        Behavior: Behavior normal.        Lab Results  Component Value Date   WBC 10.6 07/14/2023   HGB 12.1 07/14/2023   HCT 39.4 07/14/2023   PLT 299 07/14/2023   GLUCOSE 89 07/14/2023   CHOL 157 07/14/2023   TRIG 179 (H) 07/14/2023   HDL 69 07/14/2023   LDLCALC 62 07/14/2023   ALT 9 07/14/2023   AST 11 07/14/2023   NA 140 07/14/2023   K 4.1 07/14/2023   CL 102 07/14/2023   CREATININE 0.98 07/14/2023   BUN 17 07/14/2023   CO2 25 07/14/2023   TSH 0.40 12/13/2020   HGBA1C 6.1 (H) 07/14/2023   MICROALBUR 0.8 07/14/2023     Assessment & Plan:    See Problem List for Assessment and Plan of chronic medical problems.       [1]  Current Outpatient Medications on File Prior to Visit  Medication Sig Dispense Refill   amLODipine  (NORVASC ) 10 MG tablet TAKE 1 TABLET BY MOUTH EVERY DAY 90 tablet 3   bifidobacterium infantis (ALIGN) capsule Take 1 capsule by mouth daily. 90 capsule 3  budesonide-glycopyrrolate -formoterol (BREZTRI  AEROSPHERE) 160-9-4.8 MCG/ACT AERO inhaler Inhale 2 puffs into the lungs 2 (two) times daily. Getting through PAP     Cholecalciferol (VITAMIN D ) 125 MCG (5000 UT) CAPS Take by mouth daily.     losartan -hydrochlorothiazide  (HYZAAR) 100-25 MG tablet TAKE 1/2 TABLETS BY MOUTH DAILY. IN THE MORNING 45 tablet 3   omeprazole  (PRILOSEC) 40 MG capsule TAKE 1 CAPSULE (40 MG TOTAL) BY MOUTH DAILY. 90 capsule 3   oxyCODONE -acetaminophen  (PERCOCET) 7.5-325 MG tablet Take 1 tablet by mouth every 6 (six) hours as needed.     VOLTAREN  1 % GEL Apply 1 application topically daily as needed. FOR HANDS 100 g 6   No current facility-administered medications on file prior to visit.

## 2024-01-12 NOTE — Patient Instructions (Addendum)
° ° ° ° °  Blood work was ordered.       Medications changes include :   None    A referral was ordered and someone will call you to schedule an appointment.     Return in about 6 months (around 07/13/2024) for follow up.

## 2024-01-13 ENCOUNTER — Telehealth: Payer: Self-pay

## 2024-01-13 ENCOUNTER — Ambulatory Visit: Admitting: Internal Medicine

## 2024-01-13 VITALS — BP 112/68 | HR 81 | Temp 98.2°F | Ht <= 58 in | Wt 140.0 lb

## 2024-01-13 DIAGNOSIS — E1159 Type 2 diabetes mellitus with other circulatory complications: Secondary | ICD-10-CM | POA: Diagnosis not present

## 2024-01-13 DIAGNOSIS — K219 Gastro-esophageal reflux disease without esophagitis: Secondary | ICD-10-CM

## 2024-01-13 DIAGNOSIS — I152 Hypertension secondary to endocrine disorders: Secondary | ICD-10-CM | POA: Diagnosis not present

## 2024-01-13 DIAGNOSIS — E1122 Type 2 diabetes mellitus with diabetic chronic kidney disease: Secondary | ICD-10-CM

## 2024-01-13 DIAGNOSIS — N1831 Chronic kidney disease, stage 3a: Secondary | ICD-10-CM | POA: Diagnosis not present

## 2024-01-13 DIAGNOSIS — R0609 Other forms of dyspnea: Secondary | ICD-10-CM

## 2024-01-13 LAB — CBC WITH DIFFERENTIAL/PLATELET
Basophils Absolute: 0.1 K/uL (ref 0.0–0.1)
Basophils Relative: 0.8 % (ref 0.0–3.0)
Eosinophils Absolute: 0.1 K/uL (ref 0.0–0.7)
Eosinophils Relative: 0.9 % (ref 0.0–5.0)
HCT: 39.2 % (ref 36.0–46.0)
Hemoglobin: 12.5 g/dL (ref 12.0–15.0)
Lymphocytes Relative: 32.7 % (ref 12.0–46.0)
Lymphs Abs: 5.1 K/uL — ABNORMAL HIGH (ref 0.7–4.0)
MCHC: 31.8 g/dL (ref 30.0–36.0)
MCV: 81.6 fl (ref 78.0–100.0)
Monocytes Absolute: 1.2 K/uL — ABNORMAL HIGH (ref 0.1–1.0)
Monocytes Relative: 8 % (ref 3.0–12.0)
Neutro Abs: 9 K/uL — ABNORMAL HIGH (ref 1.4–7.7)
Neutrophils Relative %: 57.6 % (ref 43.0–77.0)
Platelets: 391 K/uL (ref 150.0–400.0)
RBC: 4.8 Mil/uL (ref 3.87–5.11)
RDW: 15.6 % — ABNORMAL HIGH (ref 11.5–15.5)
WBC: 15.6 K/uL — ABNORMAL HIGH (ref 4.0–10.5)

## 2024-01-13 LAB — COMPREHENSIVE METABOLIC PANEL WITH GFR
ALT: 12 U/L (ref 3–35)
AST: 15 U/L (ref 5–37)
Albumin: 4.4 g/dL (ref 3.5–5.2)
Alkaline Phosphatase: 74 U/L (ref 39–117)
BUN: 23 mg/dL (ref 6–23)
CO2: 29 meq/L (ref 19–32)
Calcium: 9.8 mg/dL (ref 8.4–10.5)
Chloride: 97 meq/L (ref 96–112)
Creatinine, Ser: 1.42 mg/dL — ABNORMAL HIGH (ref 0.40–1.20)
GFR: 35.38 mL/min — ABNORMAL LOW (ref >60.00)
Glucose, Bld: 114 mg/dL — ABNORMAL HIGH (ref 70–99)
Potassium: 3.4 meq/L — ABNORMAL LOW (ref 3.5–5.1)
Sodium: 137 meq/L (ref 135–145)
Total Bilirubin: 0.7 mg/dL (ref 0.2–1.2)
Total Protein: 7.2 g/dL (ref 6.0–8.3)

## 2024-01-13 LAB — LIPID PANEL
Cholesterol: 166 mg/dL (ref 28–200)
HDL: 78.7 mg/dL (ref >39.00)
LDL Cholesterol: 55 mg/dL (ref 10–99)
NonHDL: 86.89
Total CHOL/HDL Ratio: 2
Triglycerides: 159 mg/dL — ABNORMAL HIGH (ref 10.0–149.0)
VLDL: 31.8 mg/dL (ref 0.0–40.0)

## 2024-01-13 LAB — VITAMIN D 25 HYDROXY (VIT D DEFICIENCY, FRACTURES): VITD: 101.53 ng/mL (ref 30.00–100.00)

## 2024-01-13 LAB — HEMOGLOBIN A1C: Hgb A1c MFr Bld: 6.1 % (ref 4.6–6.5)

## 2024-01-13 NOTE — Assessment & Plan Note (Signed)
Chronic GERD controlled Denies any symptoms of reflux Continue omeprazole 40 mg daily

## 2024-01-13 NOTE — Assessment & Plan Note (Addendum)
 Chronic Presumed COPD-never officially diagnosed Chronic SOB Not using Breztri  - does not know how to use it Showed her how to use the Breztri  inhaler Use Breztri  2 puffs twice daily

## 2024-01-13 NOTE — Assessment & Plan Note (Signed)
 Chronic Blood pressure controlled  CMP, CBC Continue Norvasc  10 mg daily, losartan -HCTZ 100-25 mg   1/2 tab daily

## 2024-01-13 NOTE — Assessment & Plan Note (Signed)
 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

## 2024-01-13 NOTE — Assessment & Plan Note (Addendum)
 Chronic Associated with chronic kidney disease stage 3a, HTN Lab Results  Component Value Date   HGBA1C 6.1 (H) 07/14/2023   Sugars well controlled Check A1c, lipids Continue lifestyle control Stressed regular exercise, diabetic diet

## 2024-01-14 ENCOUNTER — Ambulatory Visit: Payer: Self-pay | Admitting: Internal Medicine

## 2024-01-14 ENCOUNTER — Telehealth: Payer: Self-pay

## 2024-01-14 ENCOUNTER — Encounter: Payer: Self-pay | Admitting: Internal Medicine

## 2024-01-14 DIAGNOSIS — N1831 Chronic kidney disease, stage 3a: Secondary | ICD-10-CM

## 2024-01-14 NOTE — Telephone Encounter (Signed)
 Pt call back said [employer] has approved her thru 01/27/2025 on Breztri .

## 2024-01-14 NOTE — Telephone Encounter (Signed)
 noted

## 2024-01-14 NOTE — Telephone Encounter (Signed)
 Gave pt a call pt is coming up due for re-enrollment on [employer] Breztri , spoke with pt,she will be calling [employer] to give consent to re-enrollment,if pt needs pap mail out pt will call back.

## 2024-02-10 ENCOUNTER — Other Ambulatory Visit (INDEPENDENT_AMBULATORY_CARE_PROVIDER_SITE_OTHER)

## 2024-02-10 DIAGNOSIS — N1831 Chronic kidney disease, stage 3a: Secondary | ICD-10-CM

## 2024-02-10 LAB — BASIC METABOLIC PANEL WITH GFR
BUN: 17 mg/dL (ref 6–23)
CO2: 29 meq/L (ref 19–32)
Calcium: 9.5 mg/dL (ref 8.4–10.5)
Chloride: 99 meq/L (ref 96–112)
Creatinine, Ser: 1.04 mg/dL (ref 0.40–1.20)
GFR: 51.38 mL/min — ABNORMAL LOW
Glucose, Bld: 123 mg/dL — ABNORMAL HIGH (ref 70–99)
Potassium: 3.9 meq/L (ref 3.5–5.1)
Sodium: 138 meq/L (ref 135–145)

## 2024-02-10 LAB — CBC WITH DIFFERENTIAL/PLATELET
Basophils Absolute: 0.1 K/uL (ref 0.0–0.1)
Basophils Relative: 1.2 % (ref 0.0–3.0)
Eosinophils Absolute: 0.2 K/uL (ref 0.0–0.7)
Eosinophils Relative: 2.2 % (ref 0.0–5.0)
HCT: 36.3 % (ref 36.0–46.0)
Hemoglobin: 11.8 g/dL — ABNORMAL LOW (ref 12.0–15.0)
Lymphocytes Relative: 28.1 % (ref 12.0–46.0)
Lymphs Abs: 2.3 K/uL (ref 0.7–4.0)
MCHC: 32.6 g/dL (ref 30.0–36.0)
MCV: 81.2 fl (ref 78.0–100.0)
Monocytes Absolute: 0.6 K/uL (ref 0.1–1.0)
Monocytes Relative: 7.5 % (ref 3.0–12.0)
Neutro Abs: 5 K/uL (ref 1.4–7.7)
Neutrophils Relative %: 61 % (ref 43.0–77.0)
Platelets: 298 K/uL (ref 150.0–400.0)
RBC: 4.47 Mil/uL (ref 3.87–5.11)
RDW: 15.4 % (ref 11.5–15.5)
WBC: 8.3 K/uL (ref 4.0–10.5)

## 2024-02-12 ENCOUNTER — Ambulatory Visit: Payer: Self-pay | Admitting: Internal Medicine

## 2024-02-12 ENCOUNTER — Encounter: Payer: Self-pay | Admitting: Internal Medicine

## 2024-07-13 ENCOUNTER — Ambulatory Visit: Admitting: Internal Medicine
